# Patient Record
Sex: Female | Born: 1940 | Race: White | Hispanic: No | Smoking: Current some day smoker
Health system: Southern US, Community
[De-identification: ages and names within clinical notes are randomized; demographics above are authoritative.]

## PROBLEM LIST (undated history)

## (undated) DIAGNOSIS — I10 Essential (primary) hypertension: Secondary | ICD-10-CM

## (undated) DIAGNOSIS — M25561 Pain in right knee: Secondary | ICD-10-CM

## (undated) DIAGNOSIS — B019 Varicella without complication: Secondary | ICD-10-CM

## (undated) DIAGNOSIS — L219 Seborrheic dermatitis, unspecified: Secondary | ICD-10-CM

## (undated) DIAGNOSIS — H269 Unspecified cataract: Secondary | ICD-10-CM

## (undated) DIAGNOSIS — C44311 Basal cell carcinoma of skin of nose: Secondary | ICD-10-CM

## (undated) DIAGNOSIS — M791 Myalgia, unspecified site: Secondary | ICD-10-CM

## (undated) DIAGNOSIS — M25562 Pain in left knee: Secondary | ICD-10-CM

## (undated) DIAGNOSIS — K56609 Unspecified intestinal obstruction, unspecified as to partial versus complete obstruction: Secondary | ICD-10-CM

## (undated) HISTORY — DX: Seborrheic dermatitis, unspecified: L21.9

## (undated) HISTORY — PX: BASAL CELL CARCINOMA EXCISION: SHX1214

## (undated) HISTORY — DX: Varicella without complication: B01.9

## (undated) HISTORY — DX: Pain in right knee: M25.562

## (undated) HISTORY — DX: Myalgia, unspecified site: M79.10

## (undated) HISTORY — DX: Unspecified cataract: H26.9

## (undated) HISTORY — DX: Pain in right knee: M25.561

## (undated) HISTORY — DX: Basal cell carcinoma of skin of nose: C44.311

## (undated) HISTORY — PX: CATARACT EXTRACTION W/ INTRAOCULAR LENS IMPLANT: SHX1309

---

## 1999-12-02 ENCOUNTER — Emergency Department (HOSPITAL_COMMUNITY): Admission: EM | Admit: 1999-12-02 | Discharge: 1999-12-02 | Payer: Self-pay | Admitting: Emergency Medicine

## 2001-05-08 ENCOUNTER — Inpatient Hospital Stay (HOSPITAL_COMMUNITY): Admission: EM | Admit: 2001-05-08 | Discharge: 2001-05-12 | Payer: Self-pay | Admitting: Psychiatry

## 2001-05-15 ENCOUNTER — Other Ambulatory Visit (HOSPITAL_COMMUNITY): Admission: RE | Admit: 2001-05-15 | Discharge: 2001-05-23 | Payer: Self-pay | Admitting: Psychiatry

## 2008-10-28 ENCOUNTER — Other Ambulatory Visit: Admission: RE | Admit: 2008-10-28 | Discharge: 2008-10-28 | Payer: Self-pay | Admitting: Family Medicine

## 2008-10-29 ENCOUNTER — Ambulatory Visit (HOSPITAL_COMMUNITY): Admission: RE | Admit: 2008-10-29 | Discharge: 2008-10-29 | Payer: Self-pay | Admitting: Family Medicine

## 2011-01-26 ENCOUNTER — Ambulatory Visit (INDEPENDENT_AMBULATORY_CARE_PROVIDER_SITE_OTHER): Payer: Medicare Other | Admitting: Internal Medicine

## 2011-01-26 ENCOUNTER — Encounter: Payer: Self-pay | Admitting: Internal Medicine

## 2011-01-26 ENCOUNTER — Other Ambulatory Visit (INDEPENDENT_AMBULATORY_CARE_PROVIDER_SITE_OTHER): Payer: Medicare Other

## 2011-01-26 ENCOUNTER — Other Ambulatory Visit: Payer: Self-pay | Admitting: Internal Medicine

## 2011-01-26 ENCOUNTER — Other Ambulatory Visit

## 2011-01-26 VITALS — BP 140/90 | HR 57 | Temp 97.5°F | Ht 65.5 in | Wt 155.0 lb

## 2011-01-26 DIAGNOSIS — Z136 Encounter for screening for cardiovascular disorders: Secondary | ICD-10-CM

## 2011-01-26 DIAGNOSIS — G250 Essential tremor: Secondary | ICD-10-CM

## 2011-01-26 DIAGNOSIS — Z0000 Encounter for general adult medical examination without abnormal findings: Secondary | ICD-10-CM

## 2011-01-26 DIAGNOSIS — G252 Other specified forms of tremor: Secondary | ICD-10-CM

## 2011-01-26 LAB — CBC WITH DIFFERENTIAL/PLATELET
Basophils Relative: 0.6 % (ref 0.0–3.0)
Eosinophils Absolute: 0 10*3/uL (ref 0.0–0.7)
Eosinophils Relative: 0.6 % (ref 0.0–5.0)
Hemoglobin: 14.9 g/dL (ref 12.0–15.0)
Lymphocytes Relative: 38.3 % (ref 12.0–46.0)
MCHC: 33.5 g/dL (ref 30.0–36.0)
MCV: 87.3 fl (ref 78.0–100.0)
Neutro Abs: 4.2 10*3/uL (ref 1.4–7.7)
RBC: 5.09 Mil/uL (ref 3.87–5.11)
WBC: 7.9 10*3/uL (ref 4.5–10.5)

## 2011-01-26 LAB — COMPREHENSIVE METABOLIC PANEL
ALT: 15 U/L (ref 0–35)
AST: 17 U/L (ref 0–37)
Albumin: 4.3 g/dL (ref 3.5–5.2)
Alkaline Phosphatase: 72 U/L (ref 39–117)
Glucose, Bld: 86 mg/dL (ref 70–99)
Potassium: 5 mEq/L (ref 3.5–5.1)
Sodium: 141 mEq/L (ref 135–145)
Total Bilirubin: 0.2 mg/dL — ABNORMAL LOW (ref 0.3–1.2)
Total Protein: 7.2 g/dL (ref 6.0–8.3)

## 2011-01-26 LAB — LIPID PANEL
HDL: 96.9 mg/dL (ref >39.00)
Total CHOL/HDL Ratio: 2
Triglycerides: 113 mg/dL (ref 0.0–149.0)

## 2011-01-26 LAB — HEPATIC FUNCTION PANEL
ALT: 15 U/L (ref 0–35)
AST: 17 U/L (ref 0–37)
Albumin: 4.3 g/dL (ref 3.5–5.2)
Alkaline Phosphatase: 72 U/L (ref 39–117)
Total Protein: 7.2 g/dL (ref 6.0–8.3)

## 2011-01-26 LAB — VITAMIN B12: Vitamin B-12: 455 pg/mL (ref 211–911)
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
## 2011-02-23 ENCOUNTER — Encounter: Payer: Self-pay | Admitting: Internal Medicine

## 2011-02-23 ENCOUNTER — Ambulatory Visit (INDEPENDENT_AMBULATORY_CARE_PROVIDER_SITE_OTHER): Payer: Medicare Other | Admitting: Internal Medicine

## 2011-02-23 DIAGNOSIS — Z720 Tobacco use: Secondary | ICD-10-CM

## 2011-02-23 DIAGNOSIS — F17200 Nicotine dependence, unspecified, uncomplicated: Secondary | ICD-10-CM

## 2011-02-23 DIAGNOSIS — Z1239 Encounter for other screening for malignant neoplasm of breast: Secondary | ICD-10-CM

## 2011-02-23 NOTE — Patient Instructions (Signed)
Thanks for coming back. Immunizations are up to date. We are scheduling a mammogram for you as we speak. Please let us schedule you for colorectal cancer screening. Bone health - 1200mg  daily of calcium - diet + tums; Vit D [phone] iu daily Labs were great.  End of life Care- return copy of the MOST form at your discretion.

## 2011-02-23 NOTE — Progress Notes (Signed)
  Subjective:    Patient ID: Brenda Gibson, female    DOB: 09/22/1941, 70 y.o.   MRN: [id number]  HPIMrs. Blades presents for follow-up after her initial new patinet visit. She has had a chance to review her last visit and has not corrections or additions to the information. We reviewed her lab work, especially lipid management. Reviewed health maintenance needs that haven't been met. She continues to feel well.   PMH, FamHx and SocHx reviewed for any changes and relevance.     Review of Systems  Constitutional: Negative.   All other systems reviewed and are negative.       Objective:   Physical Exam  Constitutional: She is oriented to person, place, and time. She appears well-developed and well-nourished. No distress.  Eyes: Conjunctivae are normal.  Cardiovascular: Normal rate and regular rhythm.   Pulmonary/Chest: Effort normal.  Musculoskeletal: Normal range of motion.  Neurological: She is alert and oriented to person, place, and time. She has normal reflexes.  Skin: Skin is warm and dry.  Psychiatric: She has a normal mood and affect. Her behavior is normal. Thought content normal.          Assessment & Plan:  1. Lab review - excellent and protective lipid profile with LDL 117, HDL 92.6. Glucose normal, renal function and 'lytes normal.  2. Health Maintenance - discussed smoking cessation - again emphasizing the importance of cessation - she remains pre-contemplative. She does agree to having a mammogram scheduled. Discussed the importance of colorectal cancer screening - she is not read to schedule although she admits to the logic and importance of screening. She is fully immunized.  At this point she will return as needed. I have asked that she let me know when she is ready for colonoscopy and when she is ready to quit smoking.

## 2011-02-24 DIAGNOSIS — Z720 Tobacco use: Secondary | ICD-10-CM

## 2011-02-24 HISTORY — DX: Tobacco use: Z72.0
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
## 2011-03-19 NOTE — H&P (Signed)
[facility]  Patient:    Brenda Gibson, Brenda Gibson                      MRN: [id number] Adm. Date:  [id number] Attending:  Rachael Fee Dictator:   Young Berry Lorin Picket, R.N., F.N.P.                   Psychiatric Admission Assessment  DATE OF ADMISSION:  May 08, 2001  PATIENT IDENTIFICATION:  This is a 70 year old Caucasian female who is married.  She is an involuntary commitment by her husband for increasingly psychotic delusions and hallucinations.  HISTORY OF PRESENT ILLNESS:  The patient today is guarded but cooperative. She denies any auditory or visual hallucinations when asked directly about this; however, she does admit that she has been noncompliant with her medications and has been using only herbal supplements, which she does not specify, and eating "a natural diet."  The patient states that she believes that her husband is badgering her about her previous history of mental illness.  The patient also states that she believes that the car is defective in that fumes are building up in the gas tank.  Also reports that she has been trying to repair problems on the car.  The patient denies any specific suicidal ideation or homicidal ideation today.  In the commitment papers, the husband has reported that the patient was stating that she was seeing the neighbors floating around in the backyard and that the patient has been stopping the car in the middle of the road, uncapping the gas tank and attempting to get something out of the tank, that she has also been dismantling her car with a screwdriver.  PAST PSYCHIATRIC HISTORY:  The patient has been followed by [facility] but was last seen there approximately one year ago up until the day of admission.  The patient has a history of three prior inpatient admissions at [facility] and possibly others which are unclear at this time.  The patients husband reports that the  patient has regular intermittent episodes of psychosis.  SUBSTANCE ABUSE HISTORY:  The patient admits to occasional alcohol use; however, her husband did report that the patient has been drinking two to three 16 ounce beers daily.  The patient, herself, denies any illegal drug use or other substance abuse.  PAST MEDICAL HISTORY:  The patients primary care [place] [facility] is Dr. Dola Factor in [place].  The patients husband states that she does not go there with any regularity because "she doesnt like doctors."  She has no medical problems of which he is aware although the patient will state at times that she has had a heart attack.  Her husband reports that, in fact, what she is attempting to say is that her heart is broken because she is sad and worried about her son who is at [facility].  MEDICATIONS:  None.  The patient was previously managed on Stelazine 2.5 mg q.h.s. and doxepin the dose is unclear.  It has been reported by her husband that he thought she was taking approximately 50 mg of doxepin q.h.s.  DRUG ALLERGIES:  No known drug allergies.  PHYSICAL EXAMINATION:  GENERAL:  The patient was seen in the emergency room at [facility] on May 08, 2001.  At that time, her PE was negative.  There were no acute problems evident.  She has no somatic complaints at this time whatsoever.  LABORATORY  DATA:  Alcohol level was less than 5.  Glucose was 127.  Urine drug screen was negative.  SOCIAL HISTORY:  The patient is married and living with her husband, Jessa Stinson.  He has expressed concerns related to the chronicity of her illness and her future prognosis when he was speaking with nurses on the unit.  Family history: The patient has four sons, all reportedly have psychiatric problems according to the patients husband and the patient does state that she has son who lives at [facility] permanently.  She states this is because he is  hyperactive.  MENTAL STATUS EXAMINATION:  This is a disheveled, thin female with a blunted and guarded affect.  She is cooperative but appears nervous with eyes darting from side to side.  Speech is normal in pace and tone, no pressure noted.  It is mostly relevant but is irrelevant at times.  Mood is guarded and suspicious.  Thought process is positive for paranoid ideations believing that her husband is against her and she does appear to be responding to internal stimuli.  She denies suicidal ideation or homicidal ideation when she was asked directly.  Cognitively, she is oriented to person and place, she is not oriented to date.  She does appear to be intact cognitively.  ADMISSION DIAGNOSES: Axis I:    1. Rule out schizophrenia, paranoid type.            2. Ethyl alcohol abuse, rule out dependence. Axis II:   Deferred. Axis III:  None. Axis IV:   Moderate problems in dealing with her own mental illness. Axis V:    Current 28, past year estimated 55.  INITIAL PLAN OF CARE:  Plan is to admit the patient involuntarily to stabilize her thinking with q.33m. checks in place.  We will initiate Risperdal 0.25 mg b.i.d. and 0.5 mg at h.s., and we will give her p.r.n. Haldol 5 mg and Ativan 2 mg.  The patient initially was on one to one observation when she arrived on the unit because she was refusing the admission process and refused to answer any questions or to release any of her belongings or purse for inspection. She was also refusing any medication at that time.  However, then she was subsequently medicated and is beginning to respond a bit at this time.  We will plan on further contacts with her husband to do some long range planning for her care and to determine a level of support at home.  ESTIMATED LENGTH OF STAY:  Five to six days. DD:  05/09/01 TD:  05/09/01 Job: [id number]

## 2011-11-16 ENCOUNTER — Telehealth: Payer: Self-pay

## 2011-11-16 NOTE — Telephone Encounter (Signed)
Ok for referral to [facility] orthopedics
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
## 2011-11-16 NOTE — Telephone Encounter (Signed)
Pt will expect a call from [facility] with appt info.

## 2011-11-18 ENCOUNTER — Other Ambulatory Visit: Payer: Self-pay | Admitting: *Deleted

## 2011-11-18 DIAGNOSIS — M549 Dorsalgia, unspecified: Secondary | ICD-10-CM

## 2013-02-22 ENCOUNTER — Encounter: Payer: Self-pay | Admitting: Internal Medicine

## 2013-02-22 ENCOUNTER — Ambulatory Visit (INDEPENDENT_AMBULATORY_CARE_PROVIDER_SITE_OTHER)
Admission: RE | Admit: 2013-02-22 | Discharge: 2013-02-22 | Disposition: A | Discharge status: Home / Self Care | Payer: Medicare Other | Source: Ambulatory Visit | Attending: Internal Medicine | Admitting: Internal Medicine

## 2013-02-22 ENCOUNTER — Ambulatory Visit (INDEPENDENT_AMBULATORY_CARE_PROVIDER_SITE_OTHER): Payer: Medicare Other | Admitting: Internal Medicine

## 2013-02-22 VITALS — BP 118/80 | HR 61 | Temp 98.2°F | Resp 16 | Wt 146.5 lb

## 2013-02-22 DIAGNOSIS — M6740 Ganglion, unspecified site: Secondary | ICD-10-CM

## 2013-02-22 DIAGNOSIS — M25532 Pain in left wrist: Secondary | ICD-10-CM

## 2013-02-22 DIAGNOSIS — M25539 Pain in unspecified wrist: Secondary | ICD-10-CM

## 2013-02-22 DIAGNOSIS — M67432 Ganglion, left wrist: Secondary | ICD-10-CM

## 2013-02-22 DIAGNOSIS — M67439 Ganglion, unspecified wrist: Secondary | ICD-10-CM | POA: Insufficient documentation

## 2013-02-22 NOTE — Patient Instructions (Signed)
Ganglion  A ganglion is a swelling under the skin that is filled with a thick, jelly-like substance. It is a synovial cyst. This is caused by a break (rupture) of the joint lining from the joint space. A ganglion often occurs near an area of repeated minor trauma (damage caused by an accident). Trauma may also be a repetitive movement at work or in a sport.  TREATMENT   It often goes away without treatment. It may reappear later. Sometimes a ganglion may need to be surgically removed. Often they are drained and injected with a steroid. Sometimes they respond to:   Rest.   Splinting.  HOME CARE INSTRUCTIONS    Your caregiver will decide the best way of treating your ganglion. Do not try to break the ganglion yourself by pressing on it, poking it with a needle, or hitting it with a heavy object.   Use medications as directed.  SEEK MEDICAL CARE IF:    The ganglion becomes larger or more painful.   You have increased redness or swelling.   You have weakness or numbness in your hand or wrist.  MAKE SURE YOU:    Understand these instructions.   Will watch your condition.   Will get help right away if you are not doing well or get worse.  Document Released: 10/15/2000 Document Revised: 01/10/2012 Document Reviewed: 12/12/2007  ExitCare Patient Information 2013 [employer].

## 2013-02-24 ENCOUNTER — Encounter: Payer: Self-pay | Admitting: Internal Medicine

## 2013-02-24 NOTE — Assessment & Plan Note (Signed)
I referred her to consider having this removed

## 2013-02-24 NOTE — Progress Notes (Signed)
  Subjective:    Patient ID: Brenda Gibson, female    DOB: Nov 28, 1940, 72 y.o.   MRN: [id number]  HPI  She comes in today with a concern about a cyst on her left wrist that he has been enlarging for several months.  Review of Systems  Constitutional: Negative.   HENT: Negative.   Eyes: Negative.   Respiratory: Negative.   Cardiovascular: Negative.   Gastrointestinal: Negative.   Endocrine: Negative.   Genitourinary: Negative.   Musculoskeletal: Negative.   Skin: Negative.   Allergic/Immunologic: Negative.   Neurological: Negative.   Hematological: Negative.   Psychiatric/Behavioral: Negative.        Objective:   Physical Exam  Musculoskeletal:       Left wrist: She exhibits deformity (prominent raised cystic soft lesion over the lateral aspect). She exhibits normal range of motion, no tenderness, no bony tenderness, no swelling, no effusion, no crepitus and no laceration.       Arms:         Assessment & Plan:

## 2013-02-24 NOTE — Assessment & Plan Note (Signed)
Plain film is normal

## 2013-06-05 ENCOUNTER — Encounter: Payer: Self-pay | Admitting: Internal Medicine

## 2013-06-05 ENCOUNTER — Ambulatory Visit (INDEPENDENT_AMBULATORY_CARE_PROVIDER_SITE_OTHER): Payer: Medicare Other | Admitting: Internal Medicine

## 2013-06-05 VITALS — BP 150/86 | HR 58 | Temp 97.2°F | Wt 144.4 lb

## 2013-06-05 DIAGNOSIS — L409 Psoriasis, unspecified: Secondary | ICD-10-CM

## 2013-06-05 DIAGNOSIS — M25559 Pain in unspecified hip: Secondary | ICD-10-CM

## 2013-06-05 DIAGNOSIS — I10 Essential (primary) hypertension: Secondary | ICD-10-CM

## 2013-06-05 DIAGNOSIS — M25552 Pain in left hip: Secondary | ICD-10-CM | POA: Insufficient documentation

## 2013-06-05 DIAGNOSIS — L408 Other psoriasis: Secondary | ICD-10-CM

## 2013-06-05 HISTORY — DX: Essential (primary) hypertension: I10

## 2013-06-05 MED ORDER — FUROSEMIDE 20 MG PO TABS: 20.0000 mg | ORAL_TABLET | Freq: Every day | ORAL | Status: DC

## 2013-06-05 MED ORDER — CLOBETASOL PROPIONATE 0.05 % EX SHAM: 10.0000 mL | MEDICATED_SHAMPOO | Freq: Every day | CUTANEOUS | Status: DC | PRN

## 2013-06-05 MED ORDER — CELECOXIB 200 MG PO CAPS: 200.0000 mg | ORAL_CAPSULE | Freq: Two times a day (BID) | ORAL | Status: DC

## 2013-06-05 NOTE — Assessment & Plan Note (Signed)
BP Readings from Last 3 Encounters:  06/05/13 150/86  02/22/13 118/80  02/23/11 138/82   Plan Add low dose loop diuretic  Recheck in 1 month, will need Bmet at that time.

## 2013-06-05 NOTE — Assessment & Plan Note (Signed)
On-going left hip pain but she is able to ambulate and manage all her ADLs.  Plan Low dose NSAID  Regular exercise.  Ortho follow up as needed.

## 2013-06-05 NOTE — Assessment & Plan Note (Signed)
Mild psoriatic scalp.  Plan Renewed Clobex 0.05% shampoo
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
## 2013-06-05 NOTE — Patient Instructions (Addendum)
Good to see you.   1. Blood pressure - persistently elevated systolic blood pressure (top number) Plan Furosemide 20 mg once a day.  If there is too much of a problem with urination we can change  2. Hip pain - Ok to use celebrex as needed.  3. Seborrheic dermatitis scalp - new Rx for Clobex shampoo to use as needed.   4. Colon cancer screening - please give consideration to colonoscopy. It prevents cancer  5. You need to not smoke. It causese COPD, empyhysema, lung cancer, heart attacks, strokes, peripheral vascular disease and loss of limb, bad breath. Plan When you are ready to quit let me know.   Smoking Cessation, Tips for Success YOU CAN QUIT SMOKING If you are ready to quit smoking, congratulations! You have chosen to help yourself be healthier. Cigarettes bring nicotine, tar, carbon monoxide, and other irritants into your body. Your lungs, heart, and blood vessels will be able to work better without these poisons. There are many different ways to quit smoking. Nicotine gum, nicotine patches, a nicotine inhaler, or nicotine nasal spray can help with physical craving. Hypnosis, support groups, and medicines help break the habit of smoking. Here are some tips to help you quit for good.  Throw away all cigarettes.  Clean and remove all ashtrays from your home, work, and car.  On a card, write down your reasons for quitting. Carry the card with you and read it when you get the urge to smoke.  Cleanse your body of nicotine. Drink enough water and fluids to keep your urine clear or pale yellow. Do this after quitting to flush the nicotine from your body.  Learn to predict your moods. Do not let a bad situation be your excuse to have a cigarette. Some situations in your life might tempt you into wanting a cigarette.  Never have "just one" cigarette. It leads to wanting another and another. Remind yourself of your decision to quit.  Change habits associated with smoking. If you smoked  while driving or when feeling stressed, try other activities to replace smoking. Stand up when drinking your coffee. Brush your teeth after eating. Sit in a different chair when you read the paper. Avoid alcohol while trying to quit, and try to drink fewer caffeinated beverages. Alcohol and caffeine may urge you to smoke.  Avoid foods and drinks that can trigger a desire to smoke, such as sugary or spicy foods and alcohol.  Ask people who smoke not to smoke around you.  Have something planned to do right after eating or having a cup of coffee. Take a walk or exercise to perk you up. This will help to keep you from overeating.  Try a relaxation exercise to calm you down and decrease your stress. Remember, you may be tense and nervous for the first 2 weeks after you quit, but this will pass.  Find new activities to keep your hands busy. Play with a pen, coin, or rubber band. Doodle or draw things on paper.  Brush your teeth right after eating. This will help cut down on the craving for the taste of tobacco after meals. You can try mouthwash, too.  Use oral substitutes, such as lemon drops, carrots, a cinnamon stick, or chewing gum, in place of cigarettes. Keep them handy so they are available when you have the urge to smoke.  When you have the urge to smoke, try deep breathing.  Designate your home as a nonsmoking area.  If you are  a heavy smoker, ask your caregiver about a prescription for nicotine chewing gum. It can ease your withdrawal from nicotine.  Reward yourself. Set aside the cigarette money you save and buy yourself something nice.  Look for support from others. Join a support group or smoking cessation program. Ask someone at home or at work to help you with your plan to quit smoking.  Always ask yourself, "Do I need this cigarette or is this just a reflex?" Tell yourself, "Today, I choose not to smoke," or "I do not want to smoke." You are reminding yourself of your decision to  quit, even if you do smoke a cigarette. HOW WILL I FEEL WHEN I QUIT SMOKING?  The benefits of not smoking start within days of quitting.  You may have symptoms of withdrawal because your body is used to nicotine (the addictive substance in cigarettes). You may crave cigarettes, be irritable, feel very hungry, cough often, get headaches, or have difficulty concentrating.  The withdrawal symptoms are only temporary. They are strongest when you first quit but will go away within 10 to 14 days.  When withdrawal symptoms occur, stay in control. Think about your reasons for quitting. Remind yourself that these are signs that your body is healing and getting used to being without cigarettes.  Remember that withdrawal symptoms are easier to treat than the major diseases that smoking can cause.  Even after the withdrawal is over, expect periodic urges to smoke. However, these cravings are generally short-lived and will go away whether you smoke or not. Do not smoke!  If you relapse and smoke again, do not lose hope. Most smokers quit 3 times before they are successful.  If you relapse, do not give up! Plan ahead and think about what you will do the next time you get the urge to smoke. LIFE AS A NONSMOKER: MAKE IT FOR A MONTH, MAKE IT FOR LIFE Day 1: Hang this page where you will see it every day. Day 2: Get rid of all ashtrays, matches, and lighters. Day 3: Drink water. Breathe deeply between sips. Day 4: Avoid places with smoke-filled air, such as bars, clubs, or the smoking section of restaurants. Day 5: Keep track of how much money you save by not smoking. Day 6: Avoid boredom. Keep a good book with you or go to the movies. Day 7: Reward yourself! One week without smoking! Day 8: Make a dental appointment to get your teeth cleaned. Day 9: Decide how you will turn down a cigarette before it is offered to you. Day 10: Review your reasons for quitting. Day 11: Distract yourself. Stay active to  keep your mind off smoking and to relieve tension. Take a walk, exercise, read a book, do a crossword puzzle, or try a new hobby. Day 12: Exercise. Get off the bus before your stop or use stairs instead of escalators. Day 13: Call on friends for support and encouragement. Day 14: Reward yourself! Two weeks without smoking! Day 15: Practice deep breathing exercises. Day 16: Bet a friend that you can stay a nonsmoker. Day 17: Ask to sit in nonsmoking sections of restaurants. Day 18: Hang up "No Smoking" signs. Day 19: Think of yourself as a nonsmoker. Day 20: Each morning, tell yourself you will not smoke. Day 21: Reward yourself! Three weeks without smoking! Day 22: Think of smoking in negative ways. Remember how it stains your teeth, gives you bad breath, and leaves you short of breath. Day 23: Eat a nutritious  breakfast. Day 24:Do not relive your days as a smoker. Day 25: Hold a pencil in your hand when talking on the telephone. Day 26: Tell all your friends you do not smoke. Day 27: Think about how much better food tastes. Day 28: Remember, one cigarette is one too many. Day 29: Take up a hobby that will keep your hands busy. Day 30: Congratulations! One month without smoking! Give yourself a big reward. Your caregiver can direct you to community resources or hospitals for support, which may include:  Group support.  Education.  Hypnosis.  Subliminal therapy. Document Released: 07/16/2004 Document Revised: 01/10/2012 Document Reviewed: 08/04/2009 [facility] Patient Information 2014 [place], [place].

## 2013-07-30 ENCOUNTER — Telehealth: Payer: Self-pay | Admitting: *Deleted

## 2013-07-30 NOTE — Telephone Encounter (Signed)
Spoke with pt.  Transferred to scheduling for appoint

## 2013-07-30 NOTE — Telephone Encounter (Signed)
On low dose furosemide for BP. If this is not tolerable will need OV to discuss change in medications

## 2013-07-30 NOTE — Telephone Encounter (Signed)
Pt called requesting the Furosemide be changed, states it is too much for her bladder. Please advise

## 2013-08-02 ENCOUNTER — Encounter: Payer: Self-pay | Admitting: Internal Medicine

## 2013-08-02 ENCOUNTER — Ambulatory Visit (INDEPENDENT_AMBULATORY_CARE_PROVIDER_SITE_OTHER): Payer: Medicare Other | Admitting: Internal Medicine

## 2013-08-02 VITALS — BP 130/80 | HR 59 | Temp 97.9°F | Wt 143.6 lb

## 2013-08-02 DIAGNOSIS — N318 Other neuromuscular dysfunction of bladder: Secondary | ICD-10-CM

## 2013-08-02 DIAGNOSIS — I10 Essential (primary) hypertension: Secondary | ICD-10-CM

## 2013-08-02 DIAGNOSIS — N3281 Overactive bladder: Secondary | ICD-10-CM | POA: Insufficient documentation

## 2013-08-02 NOTE — Assessment & Plan Note (Signed)
Trial of solifenacin succinate (vesicare) with continued furosemide to see if urinary symptoms improve. Will send mychart message regarding progress. If symptoms improve with this anticholinergic, will consider oxybutynin for long-term management.

## 2013-08-02 NOTE — Assessment & Plan Note (Signed)
Continue treatment with furosemide 20 mg daily with anticholinergic to treat baseline and furosemide-exacerbated symptoms of overactive bladder.
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
## 2013-08-02 NOTE — Patient Instructions (Addendum)
Thanks for coming back.  The lasix is doing a great job controlling the blood pressure but clearly is making the pre-existing problem of urinary frequency and urgency (OAB) worse.  Plan Continue lasix  Add Vesicare, a drug for OAB. IF this works we will call in an Rx for a generic product - Oxybutynin.  Report back to me using MyChart  Overactive Bladder, Adult The bladder has two functions that are totally opposite of the other. One is to relax and stretch out so it can store urine (fills like a balloon), and the other is to contract and squeeze down so that it can empty the urine that it has stored. Proper functioning of the bladder is a complex mixing of these two functions. The filling and emptying of the bladder can be influenced by:  The bladder.  The spinal cord.  The brain.  The nerves going to the bladder.  Other organs that are closely related to the bladder such as prostate in males and the vagina in females. As your bladder fills with urine, nerve signals are sent from the bladder to the brain to tell you that you may need to urinate. Normal urination requires that the bladder squeeze down with sufficient strength to empty the bladder, but this also requires that the bladder squeeze down sufficiently long to finish the job. In addition the sphincter muscles, which normally keep you from leaking urine, must also relax so that the urine can pass. Coordination between the bladder muscle squeezing down and the sphincter muscles relaxing is required to make everything happen normally. With an overactive bladder sometimes the muscles of the bladder contract unexpectedly and involuntarily and this causes an urgent need to urinate. The normal response is to try to hold urine in by contracting the sphincter muscles. Sometimes the bladder contracts so strongly that the sphincter muscles cannot stop the urine from passing out and incontinence occurs. This kind of incontinence is called urge  incontinence. Having an overactive bladder can be embarrassing and awkward. It can keep you from living life the way you want to. Many people think it is just something you have to put up with as you grow older or have certain health conditions. In fact, there are treatments that can help make your life easier and more pleasant. CAUSES  Many things can cause an overactive bladder. Possibilities include:  Urinary tract infection or infection of nearby tissues such as the prostate.  Prostate enlargement.  In women, multiple pregnancies or surgery on the uterus or urethra.  Bladder stones, inflammation or tumors.  Caffeine.  Alcohol.  Medications. For example, diuretics (drugs that help the body get rid of extra fluid) increase urine production. Some other medicines must be taken with lots of fluids.  Muscle or nerve weakness. This might be the result of a spinal cord injury, a stroke, multiple sclerosis or Parkinson's disease.  Diabetes can cause a high urine volume which fills the bladder so quickly that the normal urge to urinate is triggered very strongly. SYMPTOMS   Loss of bladder control. You feel the need to urinate and cannot make your body wait.  Sudden, strong urges to urinate.  Urinating 8 or more times a day.  Waking up to urinate two or more times a night. DIAGNOSIS  To decide if you have overactive bladder, your healthcare provider will probably:  Ask about symptoms you have noticed.  Ask about your overall health. This will include questions about any medications you are taking.  Do a physical examination. This will help determine if there are obvious blockages or other problems.  Order some tests. These might include:  A blood test to check for diabetes or other health issues that could be contributing to the problem.  Urine testing. This could measure the flow of urine and the pressure on the bladder.  A test of your neurological system (the brain, spinal  cord and nerves). This is the system that senses the need to urinate. Some of these tests are called flow tests, bladder pressure tests and electrical measurements of the sphincter muscle.  A bladder test to check whether it is emptying completely when you urinate.  Cytoscopy. This test uses a thin tube with a tiny camera on it. It offers a look inside your urethra and bladder to see if there are problems.  Imaging tests. You might be given a contrast dye and then asked to urinate. X-rays are taken to see how your bladder is working. TREATMENT  An overactive bladder can be treated in many ways. The treatment will depend on the cause. Whether you have a mild or severe case also makes a difference. Often, treatment can be given in your healthcare provider's office or clinic. Be sure to discuss the different options with your caregiver. They include:  Behavioral treatments. These do not involve medication or surgery:  Bladder training. For this, you would follow a schedule to urinate at regular intervals. This helps you learn to control the urge to urinate. At first, you might be asked to wait a few minutes after feeling the urge. In time, you should be able to schedule bathroom visits an hour or more apart.  Kegel exercises. These exercises strengthen the pelvic floor muscles, which support the bladder. By toning these muscles, they can help control urination, even if the bladder muscles are overactive. A specialist will teach you how to do these exercises correctly. They will require daily practice.  Weight loss. If you are obese or overweight, losing weight might stop your bladder from being overactive. Talk to your healthcare provider about how many pounds you should lose. Also ask if there is a specific program or method that would work best for you.  Diet change. This might be suggested if constipation is making your overactive bladder worse. Your healthcare provider or a nutritionist can  explain ways to change what you eat to ease constipation. Other people might need to take in less caffeine or alcohol. Sometimes drinking fewer fluids is needed, too.  Protection. This is not an actual treatment. But, you could wear special pads to take care of any leakage while you wait for other treatments to take effect. This will help you avoid embarrassment.  Physical treatments.  Electrical stimulation. Electrodes will send gentle pulses to the nerves or muscles that help control the bladder. The goal is to strengthen them. Sometimes this is done with the electrodes outside of the body. Or, they might be placed inside the body (implanted). This treatment can take several months to have an effect.  Medications. These are usually used along with other treatments. Several medicines are available. Some are injected into the muscles involved in urination. Others come in pill form. Medications sometimes prescribed include:  Anticholinergics. These drugs block the signals that the nerves deliver to the bladder. This keeps it from releasing urine at the wrong time. Researchers think the drugs might help in other ways, too.  Imipramine. This is an antidepressant. But, it relaxes bladder muscles.  Botox. This is still experimental. Some people believe that injecting it into the bladder muscles will relax them so they work more normally. It has also been injected into the sphincter muscle when the sphincter muscle does not open properly. This is a temporary fix, however. Also, it might make matters worse, especially in older people.  Surgery.  A device might be implanted to help manage your nerves. It works on the nerves that signal when you need to urinate.  Surgery is sometimes needed with electrical stimulation. If the electrodes are implanted, this is done through surgery.  Sometimes repairs need to be made through surgery. For example, the size of the bladder can be changed. This is usually  done in severe cases only. HOME CARE INSTRUCTIONS   Take any medications your healthcare provider prescribed or suggested. Follow the directions carefully.  Practice any lifestyle changes that are recommended. These might include:  Drinking less fluid or drinking at different times of the day. If you need to urinate often during the night, for example, you may need to stop drinking fluids early in the evening.  Cutting down on caffeine or alcohol. They can both make an overactive bladder worse. Caffeine is found in coffee, tea and sodas.  Doing Kegel exercises to strengthen muscles.  Losing weight, if that is recommended.  Eating a healthy and balanced diet. This will help you avoid constipation.  Keep a journal or a log. You might be asked to record how much you drink and when, and also when you feel the need to urinate.  Learn how to care for implants or other devices, such as pessaries. SEEK MEDICAL CARE IF:   Your overactive bladder gets worse.  You feel increased pain or irritation when you urinate.  You notice blood in your urine.  You have questions about any medications or devices that your healthcare provider recommended.  You notice blood, pus or swelling at the site of any test or treatment procedure.  You have an oral temperature above 102 F (38.9 C). SEEK IMMEDIATE MEDICAL CARE IF:  You have an oral temperature above 102 F (38.9 C), not controlled by medicine. Document Released: 08/14/2009 Document Revised: 01/10/2012 Document Reviewed: 08/14/2009 [facility] [place] Patient Information 2014 [place], [place].

## 2013-08-06 ENCOUNTER — Encounter: Payer: Self-pay | Admitting: Internal Medicine

## 2013-08-06 MED ORDER — OXYBUTYNIN CHLORIDE 5 MG PO TABS: 5.0000 mg | ORAL_TABLET | Freq: Two times a day (BID) | ORAL | Status: DC

## 2013-12-12 DIAGNOSIS — F259 Schizoaffective disorder, unspecified: Secondary | ICD-10-CM | POA: Diagnosis not present

## 2014-01-02 DIAGNOSIS — F4323 Adjustment disorder with mixed anxiety and depressed mood: Secondary | ICD-10-CM | POA: Diagnosis not present

## 2014-02-07 DIAGNOSIS — F259 Schizoaffective disorder, unspecified: Secondary | ICD-10-CM | POA: Diagnosis not present

## 2014-03-05 DIAGNOSIS — F259 Schizoaffective disorder, unspecified: Secondary | ICD-10-CM | POA: Diagnosis not present

## 2014-04-03 DIAGNOSIS — F259 Schizoaffective disorder, unspecified: Secondary | ICD-10-CM | POA: Diagnosis not present

## 2014-04-30 DIAGNOSIS — F259 Schizoaffective disorder, unspecified: Secondary | ICD-10-CM | POA: Diagnosis not present

## 2014-05-22 DIAGNOSIS — F259 Schizoaffective disorder, unspecified: Secondary | ICD-10-CM | POA: Diagnosis not present

## 2014-06-04 ENCOUNTER — Ambulatory Visit (INDEPENDENT_AMBULATORY_CARE_PROVIDER_SITE_OTHER): Payer: Medicare Other | Admitting: Emergency Medicine

## 2014-06-04 VITALS — BP 138/62 | HR 74 | Temp 98.0°F | Resp 16 | Ht 65.0 in | Wt 129.0 lb

## 2014-06-04 DIAGNOSIS — K0510 Chronic gingivitis, plaque induced: Secondary | ICD-10-CM

## 2014-06-04 MED ORDER — CEPHALEXIN 500 MG PO CAPS: 500.0000 mg | ORAL_CAPSULE | Freq: Four times a day (QID) | ORAL | Status: DC

## 2014-06-04 NOTE — Patient Instructions (Signed)
Gingivitis °Gingivitis is a form of gum (periodontal) disease that causes redness, soreness, and swelling (inflammation) of your gums. °CAUSES °The most common cause of gingivitis is poor oral hygiene. A sticky substance made of bacteria, mucus, and food particles (plaque), is deposited on the exposed part of teeth. As plaque builds up, it reacts with the saliva in your mouth to form something called  tartar. Tartar is a hard deposit that becomes trapped around the base of the tooth. Plaque and tartar irritate the gums, leading to the formation of gingivitis. Other factors that increase your risk for gingivitis include:  °· Tobacco use. °· Diabetes. °· Older age. °· Certain medications. °· Certain viral or fungal infections. °· Dry mouth. °· Hormonal changes such as during pregnancy. °· Poor nutrition. °· Substance abuse. °· Poor fitting dental restorations or appliances. °SYMPTOMS °You may notice inflammation of the soft tissue (gingiva) around the teeth. When these tissues become inflamed, they bleed easily, especially during flossing or brushing. The gums may also be:  °· Tender to the touch. °· Bright red, purple red, or have a shiny appearance. °· Swollen. °· Wearing away from the teeth (receding), which exposes more of the tooth. °Bad breath is often present. Continued infection around teeth can eventually cause cavities and loosen teeth. This may lead to eventual tooth loss. °DIAGNOSIS °A medical and dental history will be taken. Your mouth, teeth, and gums will be examined. Your dentist will look for soft, swollen purple-red, irritated gums. There may be deposits of plaque and tartar at the base of the teeth. Your gums will be looked at for the degree of redness, puffiness, and bleeding tendencies. Your dentist will see if any of the teeth are loose. X-rays may be taken to see if the inflammation has spread to the supporting structures of the teeth. °TREATMENT °The goal is to reduce and reverse the  inflammation. Proper treatment can usually reverse the symptoms of gingivitis and prevent further progression of the disease. Have your teeth cleaned. During the cleaning, all plaque and tartar will be removed. Instruction for proper home care will be given. You will need regular professional cleanings and check-ups in the future. °HOME CARE INSTRUCTIONS °· Brush your teeth twice a day and floss at least once per day. When flossing, it is best to floss first then brush. °· Limit sugar between meals and maintain a well-balanced diet.  °· Even the best dental hygiene will not prevent plaque from developing. It is necessary for you to see your dentist on a regular basis for cleaning and regular checkups. °· Your dentist can recommend proper oral hygiene and mouth care and suggest special toothpastes or mouth rinses. °· Stop smoking. °SEEK DENTAL OR MEDICAL CARE IF: °· You have painful, reddened tissue around your teeth, or you have puffy swollen gums. °· You have difficulty chewing. °· You notice any loose or infected teeth. °· You have swollen glands. °· Your gums bleed easily when you brush your teeth or are very tender to the touch. °Document Released: 04/13/2001 Document Revised: 01/10/2012 Document Reviewed: 01/22/2011 °[employer]® Patient Information ©2015 [employer]. This information is not intended to replace advice given to you by your health care provider. Make sure you discuss any questions you have with your health care provider. ° °

## 2014-06-04 NOTE — Progress Notes (Signed)
[facility] [address], [place] [address] [phone]  Date:  06/04/2014   Name:  Brenda Gibson   DOB:  07/25/1941   MRN:  [id number]  PCP:  Adella Hare, MD    Chief Complaint: Oral Pain   History of Present Illness:  Brenda Gibson is a 73 y.o. very pleasant female patient who presents with the following:  Patient has a painful swollen upper left gingiva.  Seen today by her dentist's coverage provider who informed her she has an abscess and referred her to an periodontist to see tomorrow.  She has severe pain and requests treatment Apparently the dentist failed to giver her antibiotic or medication for pain. She has ano fever or chills. No nausea or vomiting.  No cough or coryza.  No improvement with over the counter medications or other home remedies. Denies other complaint or health concern today.   Patient Active Problem List   Diagnosis Date Noted  . Overactive bladder 08/02/2013  . Essential hypertension, benign 06/05/2013  . 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 Pain in left hip 06/05/2013  . Wrist pain 02/22/2013  . Ganglion cyst of wrist 02/22/2013  . Tobacco abuse 02/24/2011    Past Medical History  Diagnosis Date  . Measles   . Varicella   . Myalgia   . Knee pain, bilateral   . Seborrheic dermatitis of scalp   . Basal cell carcinoma of ala nasi   . Cataract     Past Surgical History  Procedure Laterality Date  . Cataract extraction w/ intraocular lens implant      right eye. '09 (Dr. Dolores Lory)  . Basal cell carcinoma excision      '11 ([place])    History  Substance Use Topics  . Smoking status: Current Every Day Smoker -- 0.60 packs/day for 52 years    Types: Cigarettes  . Smokeless tobacco: Never Used     Comment: Pt smokes 5 cigarettes per day  . Alcohol Use: Yes     Comment: rare use    Family History  Problem Relation Age of Onset  . Alcohol abuse Mother   . Cancer Father 25    stomach cancer  . Prostate cancer  Father   . Diabetes Neg Hx   . Heart disease Neg Hx   . Hypertension Maternal Grandfather     No Known Allergies  Medication list has been reviewed and updated.  Current Outpatient Prescriptions on File Prior to Visit  Medication Sig Dispense Refill  . celecoxib (CELEBREX) 200 MG capsule Take 1 capsule (200 mg total) by mouth 2 (two) times daily.  30 capsule  5  . Clobetasol Propionate (CLOBEX) 0.05 % shampoo Apply 10 mLs topically daily as needed.  118 mL  0  . furosemide (LASIX) 20 MG tablet Take 1 tablet (20 mg total) by mouth daily.  30 tablet  3  . oxybutynin (DITROPAN) 5 MG tablet Take 1 tablet (5 mg total) by mouth 2 (two) times daily.  60 tablet  11   No current facility-administered medications on file prior to visit.    Review of Systems:  As per HPI, otherwise negative.    Physical Examination: Filed Vitals:   06/04/14 1925  BP: 138/62  Pulse: 74  Temp: 98 F (36.7 C)  Resp: 16   Filed Vitals:   06/04/14 1925  Height: 5\' 5"  (1.651 m)  Weight: 129 lb (58.514 kg)   Body mass index is 21.47  kg/(m^2). Ideal Body Weight: Weight in (lb) to have BMI = 25: 149.9   GEN: WDWN, NAD, Non-toxic, Alert & Oriented x 3 HEENT: Atraumatic, Normocephalic.  Left maxillary gingiva swollen, tender and red.  Multiple extractions Ears and Nose: No external deformity. EXTR: No clubbing/cyanosis/edema NEURO: Normal gait.  PSYCH: Normally interactive. Conversant. Not depressed or anxious appearing.  Calm demeanor.    Assessment and Plan: Gingivitis Keflex   Signed,  Ellison Carwin, MD

## 2014-07-12 DIAGNOSIS — F259 Schizoaffective disorder, unspecified: Secondary | ICD-10-CM | POA: Diagnosis not present

## 2014-07-15 DIAGNOSIS — Z23 Encounter for immunization: Secondary | ICD-10-CM | POA: Diagnosis not present

## 2014-08-12 ENCOUNTER — Ambulatory Visit (INDEPENDENT_AMBULATORY_CARE_PROVIDER_SITE_OTHER): Payer: Medicare Other | Admitting: Family

## 2014-08-12 ENCOUNTER — Encounter: Payer: Self-pay | Admitting: Family

## 2014-08-12 ENCOUNTER — Other Ambulatory Visit (INDEPENDENT_AMBULATORY_CARE_PROVIDER_SITE_OTHER): Payer: Medicare Other

## 2014-08-12 VITALS — BP 130/80 | HR 62 | Temp 98.5°F | Ht 65.0 in | Wt 127.1 lb

## 2014-08-12 DIAGNOSIS — R5383 Other fatigue: Secondary | ICD-10-CM | POA: Diagnosis not present

## 2014-08-12 DIAGNOSIS — I10 Essential (primary) hypertension: Secondary | ICD-10-CM

## 2014-08-12 DIAGNOSIS — Z0000 Encounter for general adult medical examination without abnormal findings: Secondary | ICD-10-CM

## 2014-08-12 DIAGNOSIS — M810 Age-related osteoporosis without current pathological fracture: Secondary | ICD-10-CM

## 2014-08-12 DIAGNOSIS — Z23 Encounter for immunization: Secondary | ICD-10-CM | POA: Diagnosis not present

## 2014-08-12 DIAGNOSIS — Z9189 Other specified personal risk factors, not elsewhere classified: Secondary | ICD-10-CM | POA: Insufficient documentation

## 2014-08-12 DIAGNOSIS — Z79899 Other long term (current) drug therapy: Secondary | ICD-10-CM

## 2014-08-12 HISTORY — DX: Age-related osteoporosis without current pathological fracture: M81.0

## 2014-08-12 LAB — CBC
HEMATOCRIT: 45.2 % (ref 36.0–46.0)
Hemoglobin: 14.7 g/dL (ref 12.0–15.0)
MCHC: 32.5 g/dL (ref 30.0–36.0)
MCV: 86.8 fl (ref 78.0–100.0)
PLATELETS: 311 10*3/uL (ref 150.0–400.0)
RBC: 5.21 Mil/uL — AB (ref 3.87–5.11)
RDW: 15.3 % (ref 11.5–15.5)
WBC: 8.1 10*3/uL (ref 4.0–10.5)

## 2014-08-12 LAB — BASIC METABOLIC PANEL
BUN: 10 mg/dL (ref 6–23)
CALCIUM: 9.4 mg/dL (ref 8.4–10.5)
CO2: 28 meq/L (ref 19–32)
CREATININE: 0.6 mg/dL (ref 0.4–1.2)
Chloride: 101 mEq/L (ref 96–112)
GFR: 97.13 mL/min (ref >60.00)
Glucose, Bld: 82 mg/dL (ref 70–99)
Potassium: 4.6 mEq/L (ref 3.5–5.1)
SODIUM: 135 meq/L (ref 135–145)

## 2014-08-12 LAB — LIPID PANEL
CHOL/HDL RATIO: 2
CHOLESTEROL: 225 mg/dL — AB (ref 0–200)
HDL: 92.4 mg/dL (ref >39.00)
LDL Cholesterol: 109 mg/dL — ABNORMAL HIGH (ref 0–99)
NonHDL: 132.6
TRIGLYCERIDES: 119 mg/dL (ref 0.0–149.0)
VLDL: 23.8 mg/dL (ref 0.0–40.0)

## 2014-08-12 LAB — TSH: TSH: 1.42 u[IU]/mL (ref 0.35–4.50)

## 2014-08-12 NOTE — Progress Notes (Signed)
Pre visit review using our clinic review tool, if applicable. No additional management support is needed unless otherwise documented below in the visit note. 

## 2014-08-12 NOTE — Patient Instructions (Signed)
Thank you for choosing [facility].  Summary/Instructions:   Please stop by the lab prior to leaving for your blood work. We will be in touch with the results.   You should hear back from a [place] regarding your bone density testing.   Please let us know if you have any additional questions/concerns.   Fatigue Fatigue is a feeling of tiredness, lack of energy, lack of motivation, or feeling tired all the time. Having enough rest, good nutrition, and reducing stress will normally reduce fatigue. Consult your caregiver if it persists. The nature of your fatigue will help your caregiver to find out its cause. The treatment is based on the cause.  CAUSES  There are many causes for fatigue. Most of the time, fatigue can be traced to one or more of your habits or routines. Most causes fit into one or more of three general areas. They are: Lifestyle problems  Sleep disturbances.  Overwork.  Physical exertion.  Unhealthy habits.  Poor eating habits or eating disorders.  Alcohol and/or drug use .  Lack of proper nutrition (malnutrition). Psychological problems  Stress and/or anxiety problems.  Depression.  Grief.  Boredom. Medical Problems or Conditions  Anemia.  Pregnancy.  Thyroid gland problems.  Recovery from major surgery.  Continuous pain.  Emphysema or asthma that is not well controlled  Allergic conditions.  Diabetes.  Infections (such as mononucleosis).  Obesity.  Sleep disorders, such as sleep apnea.  Heart failure or other heart-related problems.  Cancer.  Kidney disease.  Liver disease.  Effects of certain medicines such as antihistamines, cough and cold remedies, prescription pain medicines, heart and blood pressure medicines, drugs used for treatment of cancer, and some antidepressants. SYMPTOMS  The symptoms of fatigue include:   Lack of energy.  Lack of drive (motivation).  Drowsiness.  Feeling of indifference to the  surroundings. DIAGNOSIS  The details of how you feel help guide your caregiver in finding out what is causing the fatigue. You will be asked about your present and past health condition. It is important to review all medicines that you take, including prescription and non-prescription items. A thorough exam will be done. You will be questioned about your feelings, habits, and normal lifestyle. Your caregiver may suggest blood tests, urine tests, or other tests to look for common medical causes of fatigue.  TREATMENT  Fatigue is treated by correcting the underlying cause. For example, if you have continuous pain or depression, treating these causes will improve how you feel. Similarly, adjusting the dose of certain medicines will help in reducing fatigue.  HOME CARE INSTRUCTIONS   Try to get the required amount of good sleep every night.  Eat a healthy and nutritious diet, and drink enough water throughout the day.  Practice ways of relaxing (including yoga or meditation).  Exercise regularly.  Make plans to change situations that cause stress. Act on those plans so that stresses decrease over time. Keep your work and personal routine reasonable.  Avoid street drugs and minimize use of alcohol.  Start taking a daily multivitamin after consulting your caregiver. SEEK MEDICAL CARE IF:   You have persistent tiredness, which cannot be accounted for.  You have fever.  You have unintentional weight loss.  You have headaches.  You have disturbed sleep throughout the night.  You are feeling sad.  You have constipation.  You have dry skin.  You have gained weight.  You are taking any new or different medicines that you suspect are causing fatigue.  You are unable to sleep at night.  You develop any unusual swelling of your legs or other parts of your body. SEEK IMMEDIATE MEDICAL CARE IF:   You are feeling confused.  Your vision is blurred.  You feel faint or pass out.  You  develop severe headache.  You develop severe abdominal, pelvic, or back pain.  You develop chest pain, shortness of breath, or an irregular or fast heartbeat.  You are unable to pass a normal amount of urine.  You develop abnormal bleeding such as bleeding from the rectum or you vomit blood.  You have thoughts about harming yourself or committing suicide.  You are worried that you might harm someone else. MAKE SURE YOU:   Understand these instructions.  Will watch your condition.  Will get help right away if you are not doing well or get worse. Document Released: 08/15/2007 Document Revised: 01/10/2012 Document Reviewed: 02/19/2014 [facility] Patient Information 2015 [place], [place]. This information is not intended to replace advice given to you by your health care provider. Make sure you discuss any questions you have with your health care provider.

## 2014-08-12 NOTE — Assessment & Plan Note (Addendum)
Prevnar and influenza vaccination completed. Will obtain bone density scan to check for osteoporosis. Provided anticipatory guidance regarding safety, diet, weight bearing exercise, preventative health maintenance.

## 2014-08-12 NOTE — Progress Notes (Signed)
Subjective:    Patient ID: Brenda Gibson, female    DOB: Jan 21, 1941, 73 y.o.   MRN: [id number]  HPI:  Brenda Gibson is a 73 y.o. female who presents today for her annual visit and to establish care.    Health Maintenance: Dental -- Up to date Vision -- previous cataract surgery last November. Will be due in November. Immunizations -- Tetanus, Flu shot done, Prevnar Colonoscopy -- Decline colonoscopy  Mammogram -- Declines mammogram.  Bone Density -- Will schedule  Have an issue with eating secondary to partial teeth. More difficult to chew. Does not eat a lot of red meat. Eggs oatmeal, soups, vegetables.   Exercise - Active getting out and going, does not do specific exercise. Had hip problem and stopped.    No Known Allergies  Past Medical History  Diagnosis Date  . Measles   . Varicella   . Myalgia   . Knee pain, bilateral   . Seborrheic dermatitis of scalp   . Basal cell carcinoma of ala nasi   . Cataract    Family History  Problem Relation Age of Onset  . Alcohol abuse Mother   . Cancer Father 77    stomach cancer  . Prostate cancer Father   . Diabetes Neg Hx   . Heart disease Neg Hx   . Hypertension Maternal Grandfather     Review of Systems General: Denies fever, chills, fatigue, or significant weight gain/loss. Skin: Denies rashes, lumps, itching, dryness, color changes, or hair/nail changes HENT:  Head: Denies headache or neck pain  Ears: Denies changes in hearing, ringing in ears, earache, drainage  Eyes: Denies loss/changes in vision, pain, redness, blurry/double vision, flashing  lights  Nose: Denies discharge, stuffiness, itching, nosebleed, sinus pain  Throat: Denies sore throat, hoarseness, dry mouth, sores, thrush Neck: Denies lumps, swollen glands, stiffness Breasts: Denies lumps, pain, discharge Respiratory: Denies shortness of breath, cough, sputum production, wheezing Cardiovascular: Denies chest pain/discomfort, tightness,  palpitations, shortness of breath with activity, difficulty lying down, swelling, sudden awakening with shortness of breath Gastrointestinal: Denies dysphasia, heartburn, change in appetite, nausea, change in bowel habits, rectal bleeding, constipation, diarrhea, yellow skin or eyes Intolerance to some foods gives diarrhea Urinary: Denies frequency, urgency, burning/pain, blood in urine, incontinence, change in urinary strength.  Vascular: Denies calf pain with walking and leg cramping. Musculoskeletal: Denies muscle/joint pain, stiffness, back pain, redness or swelling of joints, trauma Neurological: Denies dizziness, fainting, seizures, weakness, numbness, tingling, tremor Hematologic - Denies ease of bruising or bleeding Endocrine - Denies heat or cold intolerance, sweating, frequent urination, excessive thirst, changes in appetite Fatigue and some hair loss Psychiatric - Denies nervousness, stress, depression or memory loss.  Family stress - son moved in with her and has since moved out.     Objective:    BP 130/80  Pulse 62  Temp(Src) 98.5 F (36.9 C) (Oral)  Ht 5\' 5"  (1.651 m)  Wt 127 lb 1.9 oz (57.661 kg)  BMI 21.15 kg/m2  SpO2 98% Nursing note and vital signs reviewed.  Physical Exam  Constitutional: She is oriented to person, place, and time. She appears well-developed and well-nourished. No distress.  HENT:  Head: Normocephalic.  Right Ear: External ear normal.  Left Ear: External ear normal.  Mouth/Throat: Oropharynx is clear and moist.  Eyes: Conjunctivae are normal. Pupils are equal, round, and reactive to light.  Neck: Neck supple. No JVD present. No tracheal deviation present. No thyromegaly present.  Cardiovascular: Normal rate, regular rhythm  and normal heart sounds.   Pulmonary/Chest: Effort normal and breath sounds normal. Right breast exhibits no inverted nipple, no mass, no nipple discharge, no skin change and no tenderness. Left breast exhibits no inverted  nipple, no mass, no nipple discharge, no skin change and no tenderness.  Chaperone present for this exam.  Abdominal: Soft. Bowel sounds are normal. She exhibits no distension and no mass. There is no tenderness. There is no rebound and no guarding.  Musculoskeletal: Normal range of motion.  Lymphadenopathy:    She has no cervical adenopathy.  Neurological: She is alert and oriented to person, place, and time. She has normal reflexes. No cranial nerve deficit. Coordination normal.  Skin: Skin is warm and dry.  Psychiatric: She has a normal mood and affect. Her behavior is normal. Judgment and thought content normal.        Assessment & Plan:

## 2014-08-12 NOTE — Assessment & Plan Note (Signed)
Not currently on any medications. BP remains at goal and is stable at present time. No current changes needed.

## 2014-08-12 NOTE — Assessment & Plan Note (Signed)
Obtain TSH, CBC, BMET. Will follow up pending results. Encouarged to continue eating a balanced diet of fruits and vegetables and getting sleep.

## 2014-08-13 ENCOUNTER — Telehealth: Payer: Self-pay | Admitting: Family

## 2014-08-13 DIAGNOSIS — F259 Schizoaffective disorder, unspecified: Secondary | ICD-10-CM | POA: Diagnosis not present

## 2014-08-13 NOTE — Telephone Encounter (Signed)
Called pt to let her know she had normal lab results and that she will be hearing about scheduling her bone density test soon.

## 2014-08-13 NOTE — Telephone Encounter (Signed)
Called pt and left a vm to call back regarding lab results.

## 2014-08-13 NOTE — Telephone Encounter (Signed)
Please call [name] and let her know that lab work that we did yesterday was all within the expected ranges. She should be hearing back about scheduling her bone density testing shortly.

## 2014-08-19 ENCOUNTER — Ambulatory Visit (INDEPENDENT_AMBULATORY_CARE_PROVIDER_SITE_OTHER)
Admission: RE | Admit: 2014-08-19 | Discharge: 2014-08-19 | Disposition: A | Discharge status: Home / Self Care | Payer: Medicare Other | Source: Ambulatory Visit | Attending: Family | Admitting: Family

## 2014-08-19 DIAGNOSIS — M810 Age-related osteoporosis without current pathological fracture: Secondary | ICD-10-CM | POA: Diagnosis not present

## 2014-08-22 ENCOUNTER — Telehealth: Payer: Self-pay | Admitting: Family

## 2014-08-22 NOTE — Telephone Encounter (Signed)
Please call to inform the patient that the results of her bone density indicates that she has osteopenia which means her bone mineral density is low. There is no drug therapy recommended at the current level, however it is recommended to consume 1200 mg of Calcium and 800 International Units (IU) daily and attempt to get weight bearing exercise like walking. With these results, we will follow up again in 2 years to ensure she is maintaining and not losing bone mass.

## 2014-08-23 NOTE — Telephone Encounter (Signed)
Called pt to let her know the results of bone density which indicated she has osteopenia. Gave her instruction to take 1200 mg of Calcium a day and told her to try to walk on a regular basis. Told her to follow up in 2 years to check to see if no bone mass was lost.

## 2014-09-03 ENCOUNTER — Encounter: Payer: Self-pay | Admitting: Family

## 2014-09-03 ENCOUNTER — Ambulatory Visit (INDEPENDENT_AMBULATORY_CARE_PROVIDER_SITE_OTHER): Payer: Medicare Other | Admitting: Family

## 2014-09-03 VITALS — BP 164/88 | HR 58 | Temp 97.5°F | Resp 18 | Ht 65.0 in | Wt 126.1 lb

## 2014-09-03 DIAGNOSIS — N8110 Cystocele, unspecified: Secondary | ICD-10-CM | POA: Diagnosis not present

## 2014-09-03 DIAGNOSIS — N329 Bladder disorder, unspecified: Secondary | ICD-10-CM | POA: Insufficient documentation

## 2014-09-03 NOTE — Progress Notes (Signed)
   Subjective:    Patient ID: LEGEND PECORE, female    DOB: August 09, 1941, 73 y.o.   MRN: [id number]  Chief Complaint  Patient presents with  . Bladder issues    Feels like there is "tissue" near the opening of the vagina, no urinary frequency or any abnormalities with urinating    HPI:  Brenda Gibson is a 73 y.o. female who presents today for bladder issues.  Feels like there is a sack coming down into her vagina. This feeling started last weekend which noted that she had some burning after wiping. Felt there was some tissue that was not supposed to be there. Denies any treatments. After a nights sleep there seemed to be less pressure, as opposed laying down. Have had urinary frequency in the past.   No Known Allergies  Current Outpatient Prescriptions on File Prior to Visit  Medication Sig Dispense Refill  . celecoxib (CELEBREX) 200 MG capsule Take 1 capsule (200 mg total) by mouth 2 (two) times daily. 30 capsule 5  . Clobetasol Propionate (CLOBEX) 0.05 % shampoo Apply 10 mLs topically daily as needed. 118 mL 0   No current facility-administered medications on file prior to visit.    Review of Systems    See HPI  Objective:    BP 164/88 mmHg  Pulse 58  Temp(Src) 97.5 F (36.4 C) (Oral)  Resp 18  Ht 5\' 5"  (1.651 m)  Wt 126 lb 1.9 oz (57.208 kg)  BMI 20.99 kg/m2  SpO2 97% Nursing note and vital signs reviewed.  Physical Exam  Constitutional: She is oriented to person, place, and time. No distress.  Thin elderly female who appears older than her stated age and is dressed appropriately.   Cardiovascular: Normal rate, regular rhythm and normal heart sounds.   Pulmonary/Chest: Effort normal and breath sounds normal.  Genitourinary: No erythema, tenderness or bleeding in the vagina. No foreign body around the vagina. No signs of injury around the vagina. No vaginal discharge found.  External vaginal exam without abnormality. Palpable bladder noted upon coughing and urethra  appears slightly lower. Rectovaginal wall intact and appropriate.   Neurological: She is alert and oriented to person, place, and time.  Skin: Skin is warm and dry.  Psychiatric: She has a normal mood and affect. Her behavior is normal. Judgment and thought content normal.       Assessment & Plan:

## 2014-09-03 NOTE — Assessment & Plan Note (Deleted)
Symptoms and exam consistent with potential prolapsed bladder. Refer to GYN for further management. Pt in agreement with the plan. Follow up if symptoms worsen or fail to improve before appointment.

## 2014-09-03 NOTE — Assessment & Plan Note (Signed)
Symptoms and exam consistent with potential prolapsed bladder. Refer to GYN for further management. Pt in agreement with the plan. Follow up if symptoms worsen or fail to improve before appointment.

## 2014-09-03 NOTE — Progress Notes (Signed)
Pre visit review using our clinic review tool, if applicable. No additional management support is needed unless otherwise documented below in the visit note. 

## 2014-09-03 NOTE — Patient Instructions (Addendum)
Thank you for choosing [facility].  Summary/Instructions:   We have made a referral to GYN for you. You should hear back in less than a week, if you have not, please let us know.

## 2014-09-04 ENCOUNTER — Telehealth: Payer: Self-pay | Admitting: Family

## 2014-09-04 NOTE — Telephone Encounter (Signed)
emmi emailed °

## 2014-09-10 ENCOUNTER — Telehealth: Payer: Self-pay | Admitting: Family

## 2014-09-10 ENCOUNTER — Other Ambulatory Visit: Payer: Self-pay

## 2014-09-10 NOTE — Telephone Encounter (Signed)
Patient would like to know if they are going to schedule her to see a Gyn doctor/ and for a colon screening. Please advise

## 2014-09-12 NOTE — Telephone Encounter (Signed)
Paperwork and referrals are in process.

## 2014-09-20 DIAGNOSIS — N8110 Cystocele, unspecified: Secondary | ICD-10-CM | POA: Diagnosis not present

## 2014-10-03 DIAGNOSIS — N819 Female genital prolapse, unspecified: Secondary | ICD-10-CM | POA: Diagnosis not present

## 2014-10-29 DIAGNOSIS — F259 Schizoaffective disorder, unspecified: Secondary | ICD-10-CM | POA: Diagnosis not present

## 2015-02-17 DIAGNOSIS — Z1212 Encounter for screening for malignant neoplasm of rectum: Secondary | ICD-10-CM | POA: Diagnosis not present

## 2015-02-17 DIAGNOSIS — Z1211 Encounter for screening for malignant neoplasm of colon: Secondary | ICD-10-CM | POA: Diagnosis not present

## 2015-02-17 LAB — COLOGUARD: COLOGUARD: NEGATIVE

## 2015-02-26 ENCOUNTER — Telehealth: Payer: Self-pay | Admitting: Family

## 2015-02-26 NOTE — Telephone Encounter (Signed)
Pt aware of results 

## 2015-02-26 NOTE — Telephone Encounter (Signed)
Have you heard anything about pts colonguard test?

## 2015-02-26 NOTE — Telephone Encounter (Signed)
Cologuard test was received today and was negative.

## 2015-02-26 NOTE — Telephone Encounter (Signed)
Pt called in and is requesting results of her color guard  test?

## 2015-03-05 DIAGNOSIS — F259 Schizoaffective disorder, unspecified: Secondary | ICD-10-CM | POA: Diagnosis not present

## 2015-03-10 ENCOUNTER — Encounter: Payer: Self-pay | Admitting: Family

## 2015-04-10 DIAGNOSIS — F259 Schizoaffective disorder, unspecified: Secondary | ICD-10-CM | POA: Diagnosis not present

## 2015-05-02 ENCOUNTER — Telehealth: Payer: Self-pay

## 2015-05-02 NOTE — Telephone Encounter (Signed)
Called patient to see if a recent mammogram has been done. Patient states that she has not had any recent mammograms and wishes to decline any orders at this time.

## 2015-05-15 DIAGNOSIS — F259 Schizoaffective disorder, unspecified: Secondary | ICD-10-CM | POA: Diagnosis not present

## 2015-07-08 ENCOUNTER — Encounter: Payer: Self-pay | Admitting: Internal Medicine

## 2015-07-08 ENCOUNTER — Ambulatory Visit (INDEPENDENT_AMBULATORY_CARE_PROVIDER_SITE_OTHER): Payer: Medicare Other | Admitting: Internal Medicine

## 2015-07-08 ENCOUNTER — Other Ambulatory Visit: Payer: Medicare Other

## 2015-07-08 VITALS — BP 134/80 | HR 59 | Temp 98.1°F | Ht 65.0 in | Wt 127.0 lb

## 2015-07-08 DIAGNOSIS — I10 Essential (primary) hypertension: Secondary | ICD-10-CM

## 2015-07-08 DIAGNOSIS — N8110 Cystocele, unspecified: Secondary | ICD-10-CM | POA: Diagnosis not present

## 2015-07-08 DIAGNOSIS — R300 Dysuria: Secondary | ICD-10-CM | POA: Diagnosis not present

## 2015-07-08 LAB — POCT URINALYSIS DIPSTICK
Bilirubin, UA: NEGATIVE
Blood, UA: NEGATIVE
GLUCOSE UA: NEGATIVE
Ketones, UA: NEGATIVE
NITRITE UA: NEGATIVE
Protein, UA: NEGATIVE
Spec Grav, UA: 1.025
Urobilinogen, UA: 0.2
pH, UA: 6

## 2015-07-08 MED ORDER — CEPHALEXIN 500 MG PO CAPS: 500.0000 mg | ORAL_CAPSULE | Freq: Four times a day (QID) | ORAL | Status: DC

## 2015-07-08 NOTE — Assessment & Plan Note (Signed)
stable overall by history and exam, recent data reviewed with pt, and pt to continue medical treatment as before,  to f/u any worsening symptoms or concerns BP Readings from Last 3 Encounters:  07/08/15 134/80  09/03/14 164/88  08/12/14 130/80

## 2015-07-08 NOTE — Assessment & Plan Note (Signed)
With mild incontinence, but not recurring UTI - declines urology referral at this time

## 2015-07-08 NOTE — Assessment & Plan Note (Signed)
Likely UTI - for urine cx, also cephalexin asd,  to f/u any worsening symptoms or concerns

## 2015-07-08 NOTE — Patient Instructions (Signed)
Please take all new medication as prescribed - the antibiotic  Your specimen will also be sent for culture today  Please continue all other medications as before, and refills have been done if requested.  Please have the pharmacy call with any other refills you may need.  Please keep your appointments with your specialists as you may have planned

## 2015-07-08 NOTE — Progress Notes (Signed)
Pre visit review using our clinic review tool, if applicable. No additional management support is needed unless otherwise documented below in the visit note. 

## 2015-07-08 NOTE — Progress Notes (Signed)
   Subjective:    Patient ID: Brenda Gibson, female    DOB: 1941-06-21, 74 y.o.   MRN: [id number]  HPI Here with 2 days onset dysuria, frequency, urgency, but no flank pain, hematuria or n/v, fever, chills. Pt denies chest pain, increased sob or doe, wheezing, orthopnea, PND, increased LE swelling, palpitations, dizziness or syncope.  Not confused today.  Has ongoing intermittent incontinence, but no recurring UTI so far.   Past Medical History  Diagnosis Date  . Measles   . Varicella   . Myalgia   . Knee pain, bilateral   . Seborrheic dermatitis of scalp   . Basal cell carcinoma of ala nasi   . Cataract    Past Surgical History  Procedure Laterality Date  . Cataract extraction w/ intraocular lens implant      right eye. '09 (Dr. Dolores Lory)  . Basal cell carcinoma excision      '11 ([facility])    reports that she has been smoking Cigarettes.  She has a 31.2 pack-year smoking history. She has never used smokeless tobacco. She reports that she drinks alcohol. She reports that she does not use illicit drugs. family history includes Alcohol abuse in her mother; Cancer (age of onset: 77) in her father; Hypertension in her maternal grandfather; Prostate cancer in her father. There is no history of Diabetes or Heart disease. No Known Allergies Current Outpatient Prescriptions on File Prior to Visit  Medication Sig Dispense Refill  . celecoxib (CELEBREX) 200 MG capsule Take 1 capsule (200 mg total) by mouth 2 (two) times daily. (Patient not taking: Reported on 07/08/2015) 30 capsule 5  . Clobetasol Propionate (CLOBEX) 0.05 % shampoo Apply 10 mLs topically daily as needed. (Patient not taking: Reported on 07/08/2015) 118 mL 0   No current facility-administered medications on file prior to visit.   Review of Systems  Constitutional: Negative for unusual diaphoresis or night sweats HENT: Negative for ringing in ear or discharge Eyes: Negative for double vision or worsening visual disturbance.    Respiratory: Negative for choking and stridor.   Gastrointestinal: Negative for vomiting or other signifcant bowel change Genitourinary: Negative for hematuria or change in urine volume.  Musculoskeletal: Negative for other MSK pain or swelling Skin: Negative for color change and worsening wound.  Neurological: Negative for tremors and numbness other than noted  Psychiatric/Behavioral: Negative for decreased concentration or agitation other than above       Objective:   Physical Exam BP 134/80 mmHg  Pulse 59  Temp(Src) 98.1 F (36.7 C) (Oral)  Ht 5\' 5"  (1.651 m)  Wt 127 lb (57.607 kg)  BMI 21.13 kg/m2  SpO2 98% VS noted,  Constitutional: Pt appears in no significant distress HENT: Head: NCAT.  Right Ear: External ear normal.  Left Ear: External ear normal.  Eyes: . Pupils are equal, round, and reactive to light. Conjunctivae and EOM are normal Neck: Normal range of motion. Neck supple.  Cardiovascular: Normal rate and regular rhythm.   Pulmonary/Chest: Effort normal and breath sounds without rales or wheezing.  Abd:  Soft, NT, ND, + BS, with no flank tender Neurological: Pt is alert. Not confused , motor grossly intact Skin: Skin is warm. No rash, no LE edema Psychiatric: Pt behavior is normal. No agitation.     Assessment & Plan:

## 2015-07-10 DIAGNOSIS — F259 Schizoaffective disorder, unspecified: Secondary | ICD-10-CM | POA: Diagnosis not present

## 2015-07-10 LAB — URINE CULTURE: Colony Count: 50000

## 2015-07-26 DIAGNOSIS — Z23 Encounter for immunization: Secondary | ICD-10-CM | POA: Diagnosis not present

## 2015-08-13 DIAGNOSIS — F259 Schizoaffective disorder, unspecified: Secondary | ICD-10-CM | POA: Diagnosis not present

## 2015-10-15 DIAGNOSIS — F259 Schizoaffective disorder, unspecified: Secondary | ICD-10-CM | POA: Diagnosis not present

## 2015-11-20 DIAGNOSIS — F259 Schizoaffective disorder, unspecified: Secondary | ICD-10-CM | POA: Diagnosis not present

## 2016-01-05 DIAGNOSIS — F259 Schizoaffective disorder, unspecified: Secondary | ICD-10-CM | POA: Diagnosis not present

## 2016-02-03 DIAGNOSIS — F259 Schizoaffective disorder, unspecified: Secondary | ICD-10-CM | POA: Diagnosis not present

## 2016-02-23 ENCOUNTER — Other Ambulatory Visit (INDEPENDENT_AMBULATORY_CARE_PROVIDER_SITE_OTHER): Payer: Medicare Other

## 2016-02-23 ENCOUNTER — Encounter: Payer: Self-pay | Admitting: Family

## 2016-02-23 ENCOUNTER — Ambulatory Visit (INDEPENDENT_AMBULATORY_CARE_PROVIDER_SITE_OTHER): Payer: Medicare Other | Admitting: Family

## 2016-02-23 VITALS — BP 124/82 | HR 64 | Resp 14 | Ht 65.0 in | Wt 123.0 lb

## 2016-02-23 DIAGNOSIS — Z0000 Encounter for general adult medical examination without abnormal findings: Secondary | ICD-10-CM

## 2016-02-23 DIAGNOSIS — I10 Essential (primary) hypertension: Secondary | ICD-10-CM

## 2016-02-23 LAB — COMPREHENSIVE METABOLIC PANEL
ALT: 12 U/L (ref 0–35)
AST: 15 U/L (ref 0–37)
Albumin: 4.2 g/dL (ref 3.5–5.2)
Alkaline Phosphatase: 59 U/L (ref 39–117)
BILIRUBIN TOTAL: 0.5 mg/dL (ref 0.2–1.2)
BUN: 9 mg/dL (ref 6–23)
CO2: 29 meq/L (ref 19–32)
CREATININE: 0.61 mg/dL (ref 0.40–1.20)
Calcium: 9.5 mg/dL (ref 8.4–10.5)
Chloride: 103 mEq/L (ref 96–112)
GFR: 102.22 mL/min (ref >60.00)
GLUCOSE: 101 mg/dL — AB (ref 70–99)
Potassium: 4.1 mEq/L (ref 3.5–5.1)
Sodium: 138 mEq/L (ref 135–145)
Total Protein: 7 g/dL (ref 6.0–8.3)

## 2016-02-23 LAB — CBC
HCT: 42.4 % (ref 36.0–46.0)
Hemoglobin: 14 g/dL (ref 12.0–15.0)
MCHC: 33.1 g/dL (ref 30.0–36.0)
MCV: 85.9 fl (ref 78.0–100.0)
Platelets: 310 10*3/uL (ref 150.0–400.0)
RBC: 4.94 Mil/uL (ref 3.87–5.11)
RDW: 14.9 % (ref 11.5–15.5)
WBC: 9.1 10*3/uL (ref 4.0–10.5)

## 2016-02-23 NOTE — Progress Notes (Signed)
Pre visit review using our clinic review tool, if applicable. No additional management support is needed unless otherwise documented below in the visit note. 

## 2016-02-23 NOTE — Assessment & Plan Note (Signed)
Reviewed and updated patient's medical, surgical, family and social history. Medications and allergies were also reviewed. Basic screenings for depression, activities of daily living, hearing, cognition and safety were performed. Provider list was updated and health plan was provided to the patient.   1) Anticipatory Guidance: Discussed importance of wearing a seatbelt while driving and not texting while driving; changing batteries in smoke detector at least once annually; wearing suntan lotion when outside; eating a balanced and moderate diet; getting physical activity at least 30 minutes per day.  2) Immunizations / Screenings / Labs:  All immunizations are up to date per recommendations. Due for a mammogram which patient declines. All other screenings are up to date per recommendations. Obtain CBC and CMET.  Overall well exam with risk factors for cardiovascular disease including hypertension and tobacco use. Hypertension is well-controlled lifestyle management. Discussed importance of tobacco cessation and continued risk associated with cardiovascular and respiratory disease. Patient is not ready to quit smoking at this time. Continue other healthy lifestyle behaviors and choices. Emphasize a nutritional intake that is moderate, varied, and balanced. Follow-up prevention exam in 1 year. Follow-up office visit for chronic conditions pending blood work.
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
## 2016-02-23 NOTE — Patient Instructions (Signed)
Thank you for choosing [facility].  Summary/Instructions:  Please stop by the lab on the basement level of the building for your blood work. Your results will be released to MyChart (or called to you) after review, usually within 72 hours after test completion. If any changes need to be made, you will be notified at that same time.  Health Maintenance  Topic Date Due  . MAMMOGRAM  07/16/2016 (Originally 11/02/2010)  . INFLUENZA VACCINE  06/01/2016  . Fecal DNA (Cologuard)  02/24/2018  . TETANUS/TDAP  08/12/2024  . DEXA SCAN  Completed  . ZOSTAVAX  Completed  . PNA vac Low Risk Adult  Completed   Health Maintenance, Female Adopting a healthy lifestyle and getting preventive care can go a long way to promote health and wellness. Talk with your health care provider about what schedule of regular examinations is right for you. This is a good chance for you to check in with your provider about disease prevention and staying healthy. In between checkups, there are plenty of things you can do on your own. Experts have done a lot of research about which lifestyle changes and preventive measures are most likely to keep you healthy. Ask your health care provider for more information. WEIGHT AND DIET  Eat a healthy diet  Be sure to include plenty of vegetables, fruits, low-fat dairy products, and lean protein.  Do not eat a lot of foods high in solid fats, added sugars, or salt.  Get regular exercise. This is one of the most important things you can do for your health.  Most adults should exercise for at least 150 minutes each week. The exercise should increase your heart rate and make you sweat (moderate-intensity exercise).  Most adults should also do strengthening exercises at least twice a week. This is in addition to the moderate-intensity exercise.  Maintain a healthy weight  Body mass index (BMI) is a measurement that can be used to identify possible weight problems. It estimates  body fat based on height and weight. Your health care provider can help determine your BMI and help you achieve or maintain a healthy weight.  For females 32 years of age and older:   A BMI below 18.5 is considered underweight.  A BMI of 18.5 to 24.9 is normal.  A BMI of 25 to 29.9 is considered overweight.  A BMI of 30 and above is considered obese.  Watch levels of cholesterol and blood lipids  You should start having your blood tested for lipids and cholesterol at 73 years of age, then have this test every 5 years.  You may need to have your cholesterol levels checked more often if:  Your lipid or cholesterol levels are high.  You are older than 73 years of age.  You are at high risk for heart disease.  CANCER SCREENING   Lung Cancer  Lung cancer screening is recommended for adults 21-52 years old who are at high risk for lung cancer because of a history of smoking.  A yearly low-dose CT scan of the lungs is recommended for people who:  Currently smoke.  Have quit within the past 15 years.  Have at least a 30-pack-year history of smoking. A pack year is smoking an average of one pack of cigarettes a day for 1 year.  Yearly screening should continue until it has been 15 years since you quit.  Yearly screening should stop if you develop a health problem that would prevent you from having lung cancer  treatment.  Breast Cancer  Practice breast self-awareness. This means understanding how your breasts normally appear and feel.  It also means doing regular breast self-exams. Let your health care provider know about any changes, no matter how small.  If you are in your 20s or 30s, you should have a clinical breast exam (CBE) by a health care provider every 1-3 years as part of a regular health exam.  If you are 47 or older, have a CBE every year. Also consider having a breast X-ray (mammogram) every year.  If you have a family history of breast cancer, talk to your  health care provider about genetic screening.  If you are at high risk for breast cancer, talk to your health care provider about having an MRI and a mammogram every year.  Breast cancer gene (BRCA) assessment is recommended for women who have family members with BRCA-related cancers. BRCA-related cancers include:  Breast.  Ovarian.  Tubal.  Peritoneal cancers.  Results of the assessment will determine the need for genetic counseling and BRCA1 and BRCA2 testing. Cervical Cancer Your health care provider may recommend that you be screened regularly for cancer of the pelvic organs (ovaries, uterus, and vagina). This screening involves a pelvic examination, including checking for microscopic changes to the surface of your cervix (Pap test). You may be encouraged to have this screening done every 3 years, beginning at age 96.  For women ages 66-65, health care providers may recommend pelvic exams and Pap testing every 3 years, or they may recommend the Pap and pelvic exam, combined with testing for human papilloma virus (HPV), every 5 years. Some types of HPV increase your risk of cervical cancer. Testing for HPV may also be done on women of any age with unclear Pap test results.  Other health care providers may not recommend any screening for nonpregnant women who are considered low risk for pelvic cancer and who do not have symptoms. Ask your health care provider if a screening pelvic exam is right for you.  If you have had past treatment for cervical cancer or a condition that could lead to cancer, you need Pap tests and screening for cancer for at least 20 years after your treatment. If Pap tests have been discontinued, your risk factors (such as having a new sexual partner) need to be reassessed to determine if screening should resume. Some women have medical problems that increase the chance of getting cervical cancer. In these cases, your health care provider may recommend more frequent  screening and Pap tests. Colorectal Cancer  This type of cancer can be detected and often prevented.  Routine colorectal cancer screening usually begins at 73 years of age and continues through 73 years of age.  Your health care provider may recommend screening at an earlier age if you have risk factors for colon cancer.  Your health care provider may also recommend using home test kits to check for hidden blood in the stool.  A small camera at the end of a tube can be used to examine your colon directly (sigmoidoscopy or colonoscopy). This is done to check for the earliest forms of colorectal cancer.  Routine screening usually begins at age 34.  Direct examination of the colon should be repeated every 5-10 years through 73 years of age. However, you may need to be screened more often if early forms of precancerous polyps or small growths are found. Skin Cancer  Check your skin from head to toe regularly.  Tell your  health care provider about any new moles or changes in moles, especially if there is a change in a mole's shape or color.  Also tell your health care provider if you have a mole that is larger than the size of a pencil eraser.  Always use sunscreen. Apply sunscreen liberally and repeatedly throughout the day.  Protect yourself by wearing long sleeves, pants, a wide-brimmed hat, and sunglasses whenever you are outside. HEART DISEASE, DIABETES, AND HIGH BLOOD PRESSURE   High blood pressure causes heart disease and increases the risk of stroke. High blood pressure is more likely to develop in:  People who have blood pressure in the high end of the normal range (130-139/85-89 mm Hg).  People who are overweight or obese.  People who are African American.  If you are 16-44 years of age, have your blood pressure checked every 3-5 years. If you are 60 years of age or older, have your blood pressure checked every year. You should have your blood pressure measured twice--once  when you are at a hospital or clinic, and once when you are not at a hospital or clinic. Record the average of the two measurements. To check your blood pressure when you are not at a hospital or clinic, you can use:  An automated blood pressure machine at a pharmacy.  A home blood pressure monitor.  If you are between 54 years and 45 years old, ask your health care provider if you should take aspirin to prevent strokes.  Have regular diabetes screenings. This involves taking a blood sample to check your fasting blood sugar level.  If you are at a normal weight and have a low risk for diabetes, have this test once every three years after 73 years of age.  If you are overweight and have a high risk for diabetes, consider being tested at a younger age or more often. PREVENTING INFECTION  Hepatitis B  If you have a higher risk for hepatitis B, you should be screened for this virus. You are considered at high risk for hepatitis B if:  You were born in a country where hepatitis B is common. Ask your health care provider which countries are considered high risk.  Your parents were born in a high-risk country, and you have not been immunized against hepatitis B (hepatitis B vaccine).  You have HIV or AIDS.  You use needles to inject street drugs.  You live with someone who has hepatitis B.  You have had sex with someone who has hepatitis B.  You get hemodialysis treatment.  You take certain medicines for conditions, including cancer, organ transplantation, and autoimmune conditions. Hepatitis C  Blood testing is recommended for:  Everyone born from [birth year] through 1963.  Anyone with known risk factors for hepatitis C. Sexually transmitted infections (STIs)  You should be screened for sexually transmitted infections (STIs) including gonorrhea and chlamydia if:  You are sexually active and are younger than 73 years of age.  You are older than 73 years of age and your health care  provider tells you that you are at risk for this type of infection.  Your sexual activity has changed since you were last screened and you are at an increased risk for chlamydia or gonorrhea. Ask your health care provider if you are at risk.  If you do not have HIV, but are at risk, it may be recommended that you take a prescription medicine daily to prevent HIV infection. This is called pre-exposure prophylaxis (PrEP).  You are considered at risk if:  You are sexually active and do not regularly use condoms or know the HIV status of your partner(s).  You take drugs by injection.  You are sexually active with a partner who has HIV. Talk with your health care provider about whether you are at high risk of being infected with HIV. If you choose to begin PrEP, you should first be tested for HIV. You should then be tested every 3 months for as long as you are taking PrEP.  PREGNANCY   If you are premenopausal and you may become pregnant, ask your health care provider about preconception counseling.  If you may become pregnant, take 400 to 800 micrograms (mcg) of folic acid every day.  If you want to prevent pregnancy, talk to your health care provider about birth control (contraception). OSTEOPOROSIS AND MENOPAUSE   Osteoporosis is a disease in which the bones lose minerals and strength with aging. This can result in serious bone fractures. Your risk for osteoporosis can be identified using a bone density scan.  If you are 60 years of age or older, or if you are at risk for osteoporosis and fractures, ask your health care provider if you should be screened.  Ask your health care provider whether you should take a calcium or vitamin D supplement to lower your risk for osteoporosis.  Menopause may have certain physical symptoms and risks.  Hormone replacement therapy may reduce some of these symptoms and risks. Talk to your health care provider about whether hormone replacement therapy is  right for you.  HOME CARE INSTRUCTIONS   Schedule regular health, dental, and eye exams.  Stay current with your immunizations.   Do not use any tobacco products including cigarettes, chewing tobacco, or electronic cigarettes.  If you are pregnant, do not drink alcohol.  If you are breastfeeding, limit how much and how often you drink alcohol.  Limit alcohol intake to no more than 1 drink per day for nonpregnant women. One drink equals 12 ounces of beer, 5 ounces of wine, or 1 ounces of hard liquor.  Do not use street drugs.  Do not share needles.  Ask your health care provider for help if you need support or information about quitting drugs.  Tell your health care provider if you often feel depressed.  Tell your health care provider if you have ever been abused or do not feel safe at home.   This information is not intended to replace advice given to you by your health care provider. Make sure you discuss any questions you have with your health care provider.   Document Released: 05/03/2011 Document Revised: 11/08/2014 Document Reviewed: 09/19/2013 Elsevier Interactive Patient Education 2016 [employer].  Smoking Hazards Smoking cigarettes is extremely bad for your health. Tobacco smoke has over 200 known poisons in it. It contains the poisonous gases nitrogen oxide and carbon monoxide. There are over 60 chemicals in tobacco smoke that cause cancer. Some of the chemicals found in cigarette smoke include:   Cyanide.   Benzene.   Formaldehyde.   Methanol (wood alcohol).   Acetylene (fuel used in welding torches).   Ammonia.  Even smoking lightly shortens your life expectancy by several years. You can greatly reduce the risk of medical problems for you and your family by stopping now. Smoking is the most preventable cause of death and disease in our society. Within days of quitting smoking, your circulation improves, you decrease the risk of having a heart attack,  and your lung capacity improves. There may be some increased phlegm in the first few days after quitting, and it may take months for your lungs to clear up completely. Quitting for 10 years reduces your risk of developing lung cancer to almost that of a nonsmoker.  WHAT ARE THE RISKS OF SMOKING? Cigarette smokers have an increased risk of many serious medical problems, including:  Lung cancer.   Lung disease (such as pneumonia, bronchitis, and emphysema).   Heart attack and chest pain due to the heart not getting enough oxygen (angina).   Heart disease and peripheral blood vessel disease.   Hypertension.   Stroke.   Oral cancer (cancer of the lip, mouth, or voice box).   Bladder cancer.   Pancreatic cancer.   Cervical cancer.   Pregnancy complications, including premature birth.   Stillbirths and smaller newborn babies, birth defects, and genetic damage to sperm.   Early menopause.   Lower estrogen level for women.   Infertility.   Facial wrinkles.   Blindness.   Increased risk of broken bones (fractures).   Senile dementia.   Stomach ulcers and internal bleeding.   Delayed wound healing and increased risk of complications during surgery. Because of secondhand smoke exposure, children of smokers have an increased risk of the following:   Sudden infant death syndrome (SIDS).   Respiratory infections.   Lung cancer.   Heart disease.   Ear infections.  WHY IS SMOKING ADDICTIVE? Nicotine is the chemical agent in tobacco that is capable of causing addiction or dependence. When you smoke and inhale, nicotine is absorbed rapidly into the bloodstream through your lungs. Both inhaled and noninhaled nicotine may be addictive.  WHAT ARE THE BENEFITS OF QUITTING?  There are many health benefits to quitting smoking. Some are:   The likelihood of developing cancer and heart disease decreases. Health improvements are seen almost immediately.    Blood pressure, pulse rate, and breathing patterns start returning to normal soon after quitting.   People who quit may see an improvement in their overall quality of life.  HOW DO YOU QUIT SMOKING? Smoking is an addiction with both physical and psychological effects, and longtime habits can be hard to change. Your health care provider can recommend:  Programs and community resources, which may include group support, education, or therapy.  Replacement products, such as patches, gum, and nasal sprays. Use these products only as directed. Do not replace cigarette smoking with electronic cigarettes (commonly called e-cigarettes). The safety of e-cigarettes is unknown, and some may contain harmful chemicals. FOR MORE INFORMATION  American Lung Association: www.lung.org  American Cancer Society: www.cancer.org   This information is not intended to replace advice given to you by your health care provider. Make sure you discuss any questions you have with your health care provider.   Document Released: 11/25/2004 Document Revised: 08/08/2013 Document Reviewed: 04/09/2013 Elsevier Interactive Patient Education 2016 [employer].  Steps to Quit Smoking  Smoking tobacco can be harmful to your health and can affect almost every organ in your body. Smoking puts you, and those around you, at risk for developing many serious chronic diseases. Quitting smoking is difficult, but it is one of the best things that you can do for your health. It is never too late to quit. WHAT ARE THE BENEFITS OF QUITTING SMOKING? When you quit smoking, you lower your risk of developing serious diseases and conditions, such as:  Lung cancer or lung disease, such as COPD.  Heart disease.  Stroke.  Heart attack.  Infertility.  Osteoporosis and bone fractures. Additionally, symptoms such as coughing, wheezing, and shortness of breath may get better when you quit. You may also find that you get sick less often  because your body is stronger at fighting off colds and infections. If you are pregnant, quitting smoking can help to reduce your chances of having a baby of low birth weight. HOW DO I GET READY TO QUIT? When you decide to quit smoking, create a plan to make sure that you are successful. Before you quit:  Pick a date to quit. Set a date within the next two weeks to give you time to prepare.  Write down the reasons why you are quitting. Keep this list in places where you will see it often, such as on your bathroom mirror or in your car or wallet.  Identify the people, places, things, and activities that make you want to smoke (triggers) and avoid them. Make sure to take these actions:  Throw away all cigarettes at home, at work, and in your car.  Throw away smoking accessories, such as Scientist, research (medical).  Clean your car and make sure to empty the ashtray.  Clean your home, including curtains and carpets.  Tell your family, friends, and coworkers that you are quitting. Support from your loved ones can make quitting easier.  Talk with your health care provider about your options for quitting smoking.  Find out what treatment options are covered by your health insurance. WHAT STRATEGIES CAN I USE TO QUIT SMOKING?  Talk with your healthcare provider about different strategies to quit smoking. Some strategies include:  Quitting smoking altogether instead of gradually lessening how much you smoke over a period of time. Research shows that quitting "cold [place]" is more successful than gradually quitting.  Attending in-person counseling to help you build problem-solving skills. You are more likely to have success in quitting if you attend several counseling sessions. Even short sessions of 10 minutes can be effective.  Finding resources and support systems that can help you to quit smoking and remain smoke-free after you quit. These resources are most helpful when you use them often. They  can include:  Online chats with a Social worker.  Telephone quitlines.  Printed Furniture conservator/restorer.  Support groups or group counseling.  Text messaging programs.  Mobile phone applications.  Taking medicines to help you quit smoking. (If you are pregnant or breastfeeding, talk with your health care provider first.) Some medicines contain nicotine and some do not. Both types of medicines help with cravings, but the medicines that include nicotine help to relieve withdrawal symptoms. Your health care provider may recommend:  Nicotine patches, gum, or lozenges.  Nicotine inhalers or sprays.  Non-nicotine medicine that is taken by mouth. Talk with your health care provider about combining strategies, such as taking medicines while you are also receiving in-person counseling. Using these two strategies together makes you more likely to succeed in quitting than if you used either strategy on its own. If you are pregnant or breastfeeding, talk with your health care provider about finding counseling or other support strategies to quit smoking. Do not take medicine to help you quit smoking unless told to do so by your health care provider. WHAT THINGS CAN I DO TO MAKE IT EASIER TO QUIT? Quitting smoking might feel overwhelming at first, but there is a lot that you can do to make it easier. Take these important actions:  Reach out to your family and friends  and ask that they support and encourage you during this time. Call telephone quitlines, reach out to support groups, or work with a counselor for support.  Ask people who smoke to avoid smoking around you.  Avoid places that trigger you to smoke, such as bars, parties, or smoke-break areas at work.  Spend time around people who do not smoke.  Lessen stress in your life, because stress can be a smoking trigger for some people. To lessen stress, try:  Exercising regularly.  Deep-breathing exercises.  Yoga.  Meditating.  Performing a  body scan. This involves closing your eyes, scanning your body from head to toe, and noticing which parts of your body are particularly tense. Purposefully relax the muscles in those areas.  Download or purchase mobile phone or tablet apps (applications) that can help you stick to your quit plan by providing reminders, tips, and encouragement. There are many free apps, such as QuitGuide from the [employer] Office manager for Disease Control and Prevention). You can find other support for quitting smoking (smoking cessation) through smokefree.gov and other websites. HOW WILL I FEEL WHEN I QUIT SMOKING? Within the first 24 hours of quitting smoking, you may start to feel some withdrawal symptoms. These symptoms are usually most noticeable 2-3 days after quitting, but they usually do not last beyond 2-3 weeks. Changes or symptoms that you might experience include:  Mood swings.  Restlessness, anxiety, or irritation.  Difficulty concentrating.  Dizziness.  Strong cravings for sugary foods in addition to nicotine.  Mild weight gain.  Constipation.  Nausea.  Coughing or a sore throat.  Changes in how your medicines work in your body.  A depressed mood.  Difficulty sleeping (insomnia). After the first 2-3 weeks of quitting, you may start to notice more positive results, such as:  Improved sense of smell and taste.  Decreased coughing and sore throat.  Slower heart rate.  Lower blood pressure.  Clearer skin.  The ability to breathe more easily.  Fewer sick days. Quitting smoking is very challenging for most people. Do not get discouraged if you are not successful the first time. Some people need to make many attempts to quit before they achieve long-term success. Do your best to stick to your quit plan, and talk with your health care provider if you have any questions or concerns.   This information is not intended to replace advice given to you by your health care provider. Make sure you  discuss any questions you have with your health care provider.   Document Released: 10/12/2001 Document Revised: 03/04/2015 Document Reviewed: 03/04/2015 Elsevier Interactive Patient Education 2016 [employer].  Smoking Cessation, Tips for Success If you are ready to quit smoking, congratulations! You have chosen to help yourself be healthier. Cigarettes bring nicotine, tar, carbon monoxide, and other irritants into your body. Your lungs, heart, and blood vessels will be able to work better without these poisons. There are many different ways to quit smoking. Nicotine gum, nicotine patches, a nicotine inhaler, or nicotine nasal spray can help with physical craving. Hypnosis, support groups, and medicines help break the habit of smoking. WHAT THINGS CAN I DO TO MAKE QUITTING EASIER?  Here are some tips to help you quit for good:  Pick a date when you will quit smoking completely. Tell all of your friends and family about your plan to quit on that date.  Do not try to slowly cut down on the number of cigarettes you are smoking. Pick a quit date and quit  smoking completely starting on that day.  Throw away all cigarettes.   Clean and remove all ashtrays from your home, work, and car.  On a card, write down your reasons for quitting. Carry the card with you and read it when you get the urge to smoke.  Cleanse your body of nicotine. Drink enough water and fluids to keep your urine clear or pale yellow. Do this after quitting to flush the nicotine from your body.  Learn to predict your moods. Do not let a bad situation be your excuse to have a cigarette. Some situations in your life might tempt you into wanting a cigarette.  Never have "just one" cigarette. It leads to wanting another and another. Remind yourself of your decision to quit.  Change habits associated with smoking. If you smoked while driving or when feeling stressed, try other activities to replace smoking. Stand up when drinking  your coffee. Brush your teeth after eating. Sit in a different chair when you read the paper. Avoid alcohol while trying to quit, and try to drink fewer caffeinated beverages. Alcohol and caffeine may urge you to smoke.  Avoid foods and drinks that can trigger a desire to smoke, such as sugary or spicy foods and alcohol.  Ask people who smoke not to smoke around you.  Have something planned to do right after eating or having a cup of coffee. For example, plan to take a walk or exercise.  Try a relaxation exercise to calm you down and decrease your stress. Remember, you may be tense and nervous for the first 2 weeks after you quit, but this will pass.  Find new activities to keep your hands busy. Play with a pen, coin, or rubber band. Doodle or draw things on paper.  Brush your teeth right after eating. This will help cut down on the craving for the taste of tobacco after meals. You can also try mouthwash.   Use oral substitutes in place of cigarettes. Try using lemon drops, carrots, cinnamon sticks, or chewing gum. Keep them handy so they are available when you have the urge to smoke.  When you have the urge to smoke, try deep breathing.  Designate your home as a nonsmoking area.  If you are a heavy smoker, ask your health care provider about a prescription for nicotine chewing gum. It can ease your withdrawal from nicotine.  Reward yourself. Set aside the cigarette money you save and buy yourself something nice.  Look for support from others. Join a support group or smoking cessation program. Ask someone at home or at work to help you with your plan to quit smoking.  Always ask yourself, "Do I need this cigarette or is this just a reflex?" Tell yourself, "Today, I choose not to smoke," or "I do not want to smoke." You are reminding yourself of your decision to quit.  Do not replace cigarette smoking with electronic cigarettes (commonly called e-cigarettes). The safety of e-cigarettes is  unknown, and some may contain harmful chemicals.  If you relapse, do not give up! Plan ahead and think about what you will do the next time you get the urge to smoke. HOW WILL I FEEL WHEN I QUIT SMOKING? You may have symptoms of withdrawal because your body is used to nicotine (the addictive substance in cigarettes). You may crave cigarettes, be irritable, feel very hungry, cough often, get headaches, or have difficulty concentrating. The withdrawal symptoms are only temporary. They are strongest when you first quit but  will go away within 10-14 days. When withdrawal symptoms occur, stay in control. Think about your reasons for quitting. Remind yourself that these are signs that your body is healing and getting used to being without cigarettes. Remember that withdrawal symptoms are easier to treat than the major diseases that smoking can cause.  Even after the withdrawal is over, expect periodic urges to smoke. However, these cravings are generally short lived and will go away whether you smoke or not. Do not smoke! WHAT RESOURCES ARE AVAILABLE TO HELP ME QUIT SMOKING? Your health care provider can direct you to community resources or hospitals for support, which may include:  Group support.  Education.  Hypnosis.  Therapy.   This information is not intended to replace advice given to you by your health care provider. Make sure you discuss any questions you have with your health care provider.   Document Released: 07/16/2004 Document Revised: 11/08/2014 Document Reviewed: 04/05/2013 Elsevier Interactive Patient Education [employer].

## 2016-03-02 DIAGNOSIS — F259 Schizoaffective disorder, unspecified: Secondary | ICD-10-CM | POA: Diagnosis not present

## 2016-03-24 DIAGNOSIS — F259 Schizoaffective disorder, unspecified: Secondary | ICD-10-CM | POA: Diagnosis not present

## 2016-04-28 DIAGNOSIS — F259 Schizoaffective disorder, unspecified: Secondary | ICD-10-CM | POA: Diagnosis not present

## 2016-06-19 ENCOUNTER — Encounter (HOSPITAL_COMMUNITY): Payer: Self-pay | Admitting: Emergency Medicine

## 2016-06-19 ENCOUNTER — Emergency Department (HOSPITAL_COMMUNITY): Payer: Medicare Other

## 2016-06-19 ENCOUNTER — Inpatient Hospital Stay (HOSPITAL_COMMUNITY)
Admission: EM | Admit: 2016-06-19 | Discharge: 2016-07-07 | DRG: 336 | Disposition: A | Discharge status: Home Health | Payer: Medicare Other | Attending: Internal Medicine | Admitting: Internal Medicine

## 2016-06-19 DIAGNOSIS — Z4659 Encounter for fitting and adjustment of other gastrointestinal appliance and device: Secondary | ICD-10-CM

## 2016-06-19 DIAGNOSIS — K565 Intestinal adhesions [bands] with obstruction (postprocedural) (postinfection): Secondary | ICD-10-CM | POA: Diagnosis not present

## 2016-06-19 DIAGNOSIS — Z8249 Family history of ischemic heart disease and other diseases of the circulatory system: Secondary | ICD-10-CM | POA: Diagnosis not present

## 2016-06-19 DIAGNOSIS — E876 Hypokalemia: Secondary | ICD-10-CM | POA: Diagnosis not present

## 2016-06-19 DIAGNOSIS — K567 Ileus, unspecified: Secondary | ICD-10-CM | POA: Diagnosis not present

## 2016-06-19 DIAGNOSIS — I10 Essential (primary) hypertension: Secondary | ICD-10-CM | POA: Diagnosis not present

## 2016-06-19 DIAGNOSIS — R339 Retention of urine, unspecified: Secondary | ICD-10-CM | POA: Diagnosis not present

## 2016-06-19 DIAGNOSIS — Z85828 Personal history of other malignant neoplasm of skin: Secondary | ICD-10-CM | POA: Diagnosis not present

## 2016-06-19 DIAGNOSIS — N3080 Other cystitis without hematuria: Secondary | ICD-10-CM | POA: Diagnosis present

## 2016-06-19 DIAGNOSIS — K9189 Other postprocedural complications and disorders of digestive system: Secondary | ICD-10-CM | POA: Diagnosis not present

## 2016-06-19 DIAGNOSIS — K5669 Other intestinal obstruction: Secondary | ICD-10-CM | POA: Diagnosis not present

## 2016-06-19 DIAGNOSIS — E440 Moderate protein-calorie malnutrition: Secondary | ICD-10-CM | POA: Diagnosis not present

## 2016-06-19 DIAGNOSIS — K8020 Calculus of gallbladder without cholecystitis without obstruction: Secondary | ICD-10-CM | POA: Diagnosis not present

## 2016-06-19 DIAGNOSIS — Z800 Family history of malignant neoplasm of digestive organs: Secondary | ICD-10-CM

## 2016-06-19 DIAGNOSIS — M7989 Other specified soft tissue disorders: Secondary | ICD-10-CM | POA: Diagnosis not present

## 2016-06-19 DIAGNOSIS — R030 Elevated blood-pressure reading, without diagnosis of hypertension: Secondary | ICD-10-CM | POA: Diagnosis not present

## 2016-06-19 DIAGNOSIS — K913 Postprocedural intestinal obstruction: Secondary | ICD-10-CM | POA: Diagnosis not present

## 2016-06-19 DIAGNOSIS — K529 Noninfective gastroenteritis and colitis, unspecified: Secondary | ICD-10-CM | POA: Diagnosis present

## 2016-06-19 DIAGNOSIS — R1030 Lower abdominal pain, unspecified: Secondary | ICD-10-CM | POA: Diagnosis not present

## 2016-06-19 DIAGNOSIS — E8809 Other disorders of plasma-protein metabolism, not elsewhere classified: Secondary | ICD-10-CM | POA: Diagnosis not present

## 2016-06-19 DIAGNOSIS — E878 Other disorders of electrolyte and fluid balance, not elsewhere classified: Secondary | ICD-10-CM | POA: Diagnosis present

## 2016-06-19 DIAGNOSIS — R05 Cough: Secondary | ICD-10-CM | POA: Diagnosis not present

## 2016-06-19 DIAGNOSIS — Z6825 Body mass index (BMI) 25.0-25.9, adult: Secondary | ICD-10-CM

## 2016-06-19 DIAGNOSIS — R140 Abdominal distension (gaseous): Secondary | ICD-10-CM | POA: Diagnosis not present

## 2016-06-19 DIAGNOSIS — R109 Unspecified abdominal pain: Secondary | ICD-10-CM | POA: Diagnosis not present

## 2016-06-19 DIAGNOSIS — R1084 Generalized abdominal pain: Secondary | ICD-10-CM | POA: Diagnosis not present

## 2016-06-19 DIAGNOSIS — R338 Other retention of urine: Secondary | ICD-10-CM | POA: Diagnosis not present

## 2016-06-19 DIAGNOSIS — R112 Nausea with vomiting, unspecified: Secondary | ICD-10-CM | POA: Diagnosis not present

## 2016-06-19 DIAGNOSIS — Z0189 Encounter for other specified special examinations: Secondary | ICD-10-CM

## 2016-06-19 DIAGNOSIS — E8770 Fluid overload, unspecified: Secondary | ICD-10-CM | POA: Diagnosis not present

## 2016-06-19 DIAGNOSIS — Z720 Tobacco use: Secondary | ICD-10-CM | POA: Diagnosis present

## 2016-06-19 DIAGNOSIS — N9989 Other postprocedural complications and disorders of genitourinary system: Secondary | ICD-10-CM | POA: Diagnosis not present

## 2016-06-19 DIAGNOSIS — K56609 Unspecified intestinal obstruction, unspecified as to partial versus complete obstruction: Secondary | ICD-10-CM

## 2016-06-19 DIAGNOSIS — F17210 Nicotine dependence, cigarettes, uncomplicated: Secondary | ICD-10-CM | POA: Diagnosis present

## 2016-06-19 DIAGNOSIS — Z4682 Encounter for fitting and adjustment of non-vascular catheter: Secondary | ICD-10-CM | POA: Diagnosis not present

## 2016-06-19 DIAGNOSIS — K5660 Unspecified intestinal obstruction: Secondary | ICD-10-CM | POA: Diagnosis not present

## 2016-06-19 LAB — CBC WITH DIFFERENTIAL/PLATELET
BASOS ABS: 0 10*3/uL (ref 0.0–0.1)
Basophils Relative: 0 %
EOS PCT: 0 %
Eosinophils Absolute: 0 10*3/uL (ref 0.0–0.7)
HCT: 39.2 % (ref 36.0–46.0)
Hemoglobin: 13.1 g/dL (ref 12.0–15.0)
LYMPHS PCT: 14 %
Lymphs Abs: 1.9 10*3/uL (ref 0.7–4.0)
MCH: 28.4 pg (ref 26.0–34.0)
MCHC: 33.4 g/dL (ref 30.0–36.0)
MCV: 85 fL (ref 78.0–100.0)
Monocytes Absolute: 0.3 10*3/uL (ref 0.1–1.0)
Monocytes Relative: 2 %
Neutro Abs: 11.6 10*3/uL — ABNORMAL HIGH (ref 1.7–7.7)
Neutrophils Relative %: 84 %
PLATELETS: 275 10*3/uL (ref 150–400)
RBC: 4.61 MIL/uL (ref 3.87–5.11)
RDW: 15 % (ref 11.5–15.5)
WBC: 13.8 10*3/uL — ABNORMAL HIGH (ref 4.0–10.5)

## 2016-06-19 LAB — I-STAT CG4 LACTIC ACID, ED
Lactic Acid, Venous: 1.53 mmol/L (ref 0.5–1.9)
Lactic Acid, Venous: 1.16 mmol/L (ref 0.5–1.9)

## 2016-06-19 LAB — URINALYSIS, ROUTINE W REFLEX MICROSCOPIC
Bilirubin Urine: NEGATIVE
Glucose, UA: NEGATIVE mg/dL
Hgb urine dipstick: NEGATIVE
KETONES UR: NEGATIVE mg/dL
NITRITE: NEGATIVE
PH: 6.5 (ref 5.0–8.0)
Protein, ur: NEGATIVE mg/dL
Specific Gravity, Urine: 1.042 — ABNORMAL HIGH (ref 1.005–1.030)

## 2016-06-19 LAB — COMPREHENSIVE METABOLIC PANEL
ALT: 14 U/L (ref 14–54)
AST: 17 U/L (ref 15–41)
Albumin: 3.9 g/dL (ref 3.5–5.0)
Alkaline Phosphatase: 62 U/L (ref 38–126)
Anion gap: 9 (ref 5–15)
BUN: 13 mg/dL (ref 6–20)
CO2: 26 mmol/L (ref 22–32)
CREATININE: 0.56 mg/dL (ref 0.44–1.00)
Calcium: 9.4 mg/dL (ref 8.9–10.3)
Chloride: 103 mmol/L (ref 101–111)
GFR calc Af Amer: >60 mL/min (ref >60)
GFR calc non Af Amer: >60 mL/min (ref >60)
Glucose, Bld: 162 mg/dL — ABNORMAL HIGH (ref 65–99)
Potassium: 3.7 mmol/L (ref 3.5–5.1)
SODIUM: 138 mmol/L (ref 135–145)
Total Bilirubin: 0.6 mg/dL (ref 0.3–1.2)
Total Protein: 6.9 g/dL (ref 6.5–8.1)

## 2016-06-19 LAB — URINE MICROSCOPIC-ADD ON

## 2016-06-19 LAB — URINALYSIS, DIPSTICK ONLY
Bilirubin Urine: NEGATIVE
GLUCOSE, UA: NEGATIVE mg/dL
Hgb urine dipstick: NEGATIVE
Ketones, ur: NEGATIVE mg/dL
Nitrite: NEGATIVE
Protein, ur: NEGATIVE mg/dL
Specific Gravity, Urine: 1.042 — ABNORMAL HIGH (ref 1.005–1.030)
pH: 6.5 (ref 5.0–8.0)

## 2016-06-19 LAB — LIPASE, BLOOD: Lipase: 24 U/L (ref 11–51)

## 2016-06-19 MED ORDER — ONDANSETRON HCL 4 MG PO TABS
4.0000 mg | ORAL_TABLET | Freq: Four times a day (QID) | ORAL | Status: DC | PRN
  Administered 2016-06-21: 4 mg via ORAL
  Filled 2016-06-19: qty 1

## 2016-06-19 MED ORDER — TRIFLUOPERAZINE HCL 5 MG PO TABS
5.0000 mg | ORAL_TABLET | Freq: Every evening | ORAL | Status: DC
  Administered 2016-06-19: 5 mg via ORAL
  Filled 2016-06-19: qty 1

## 2016-06-19 MED ORDER — SODIUM CHLORIDE 0.9 % IV SOLN
INTRAVENOUS | Status: AC
  Administered 2016-06-19: 13:00:00 via INTRAVENOUS

## 2016-06-19 MED ORDER — IOPAMIDOL (ISOVUE-300) INJECTION 61%: 100.0000 mL | Freq: Once | INTRAVENOUS | Status: DC | PRN

## 2016-06-19 MED ORDER — IOPAMIDOL (ISOVUE-M 300) INJECTION 61%: 15.0000 mL | Freq: Once | INTRAMUSCULAR | Status: DC | PRN

## 2016-06-19 MED ORDER — DEXTROSE 5 % IV SOLN
1.0000 g | Freq: Once | INTRAVENOUS | Status: AC
  Administered 2016-06-19: 1 g via INTRAVENOUS
  Filled 2016-06-19: qty 10

## 2016-06-19 MED ORDER — ONDANSETRON HCL 4 MG/2ML IJ SOLN
4.0000 mg | Freq: Once | INTRAMUSCULAR | Status: AC
  Administered 2016-06-19: 4 mg via INTRAVENOUS
  Filled 2016-06-19: qty 2

## 2016-06-19 MED ORDER — DIATRIZOATE MEGLUMINE & SODIUM 66-10 % PO SOLN
15.0000 mL | Freq: Once | ORAL | Status: AC
  Administered 2016-06-19: 15 mL via ORAL

## 2016-06-19 MED ORDER — DEXTROSE 5 % IV SOLN
1.0000 g | INTRAVENOUS | Status: DC
  Administered 2016-06-20 – 2016-06-26 (×7): 1 g via INTRAVENOUS
  Filled 2016-06-19 (×7): qty 10

## 2016-06-19 MED ORDER — SODIUM CHLORIDE 0.9 % IV BOLUS (SEPSIS)
1000.0000 mL | Freq: Once | INTRAVENOUS | Status: AC
  Administered 2016-06-19: 1000 mL via INTRAVENOUS

## 2016-06-19 MED ORDER — POLYETHYLENE GLYCOL 3350 17 G PO PACK
17.0000 g | PACK | Freq: Every day | ORAL | Status: DC | PRN
  Administered 2016-06-19: 17 g via ORAL
  Filled 2016-06-19: qty 1

## 2016-06-19 MED ORDER — IOPAMIDOL (ISOVUE-300) INJECTION 61%
100.0000 mL | Freq: Once | INTRAVENOUS | Status: AC | PRN
  Administered 2016-06-19: 100 mL via INTRAVENOUS

## 2016-06-19 MED ORDER — TRAMADOL HCL 50 MG PO TABS
50.0000 mg | ORAL_TABLET | Freq: Four times a day (QID) | ORAL | Status: DC | PRN
  Administered 2016-06-19 – 2016-06-22 (×6): 50 mg via ORAL
  Filled 2016-06-19 (×6): qty 1

## 2016-06-19 MED ORDER — ONDANSETRON HCL 4 MG/2ML IJ SOLN
4.0000 mg | Freq: Four times a day (QID) | INTRAMUSCULAR | Status: DC | PRN
  Administered 2016-06-19 – 2016-07-02 (×9): 4 mg via INTRAVENOUS
  Filled 2016-06-19 (×8): qty 2

## 2016-06-19 NOTE — Progress Notes (Signed)
Patient from ED. Alert and oriented x 3. All orders acknowledged. Patient oriented to room and environment. Placed comfortably in bed. Vital signs obtained. Will continue to monitor.

## 2016-06-19 NOTE — ED Provider Notes (Signed)
[facility] DEPT Provider Note   CSN: [id number] Arrival date & time: 06/19/16  [id number]     History   Chief Complaint Chief Complaint  Patient presents with  . Abdominal Pain    HPI Brenda Gibson is a 75 y.o. female.  Patient presents with lower abdominal pain that woke her from sleep around 1 AM. Pain is constant, nothing makes it better or worse. Associated with 3 episodes of nonbloody nonbilious emesis. No diarrhea. No fever. No urinary symptoms. Denies any sick contacts. Patient at chicken salad which she has eaten for the past several days. Denies any previous history of this, stomach pain. No previous abdominal surgeries. No vaginal bleeding or discharge. No urinary symptoms. No back pain or chest pain. No fever.   The history is provided by the patient.  Abdominal Pain   Associated symptoms include nausea and vomiting. Pertinent negatives include fever, diarrhea, dysuria, hematuria, headaches and myalgias.    Past Medical History:  Diagnosis Date  . Basal cell carcinoma of ala nasi   . Cataract   . Knee pain, bilateral   . Measles   . Myalgia   . Seborrheic dermatitis of scalp   . Varicella     Patient Active Problem List   Diagnosis Date Noted  . Medicare annual wellness visit, subsequent 02/23/2016  . Dysuria 07/08/2015  . Bladder problem 09/03/2014  . Female bladder prolapse 09/03/2014  . Fatigue 08/12/2014  . Routine general medical examination at a health care facility 08/12/2014  . At risk for osteoporosis/osteopenia 08/12/2014  . Overactive bladder 08/02/2013  . Essential hypertension, benign 06/05/2013  . 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 Pain in left hip 06/05/2013  . Wrist pain 02/22/2013  . Ganglion cyst of wrist 02/22/2013  . Tobacco abuse 02/24/2011    Past Surgical History:  Procedure Laterality Date  . BASAL CELL CARCINOMA EXCISION     '11 [facility])  . CATARACT EXTRACTION W/ INTRAOCULAR LENS IMPLANT     right eye. '09 (Dr. Dolores Lory)     OB History    No data available       Home Medications    Prior to Admission medications   Medication Sig Start Date End Date Taking? Authorizing Provider  trifluoperazine (STELAZINE) 5 MG tablet Take 1 tablet by mouth every evening. 06/04/16  Yes Historical Provider, MD    Family History Family History  Problem Relation Age of Onset  . Alcohol abuse Mother   . Cancer Father 39    stomach cancer  . Prostate cancer Father   . Hypertension Maternal Grandfather   . Diabetes Neg Hx   . Heart disease Neg Hx     Social History Social History  Substance Use Topics  . Smoking status: Current Every Day Smoker    Packs/day: 0.60    Years: 52.00    Types: Cigarettes  . Smokeless tobacco: Never Used     Comment: Pt smokes 5 cigarettes per day  . Alcohol use Yes     Comment: rare use     Allergies   Review of patient's allergies indicates no known allergies.   Review of Systems Review of Systems  Constitutional: Positive for activity change, appetite change and fatigue. Negative for fever.  HENT: Negative for congestion.   Eyes: Negative for visual disturbance.  Respiratory: Negative for cough, chest tightness and shortness of breath.   Cardiovascular: Negative for chest pain.  Gastrointestinal: Positive for abdominal pain, nausea and vomiting. Negative for diarrhea.  Genitourinary: Negative  for dysuria, hematuria, vaginal bleeding and vaginal discharge.  Musculoskeletal: Negative for back pain and myalgias.  Neurological: Negative for dizziness, weakness and headaches.   A complete 10 system review of systems was obtained and all systems are negative except as noted in the HPI and PMH.    Physical Exam Updated Vital Signs BP 173/62   Pulse 66   Temp 97.6 F (36.4 C) (Oral)   Resp 20   Ht 5' 5.5" (1.664 m)   Wt 120 lb (54.4 kg)   SpO2 96%   BMI 19.67 kg/m   Physical Exam  Constitutional: She is oriented to person, place, and time. She appears  well-developed and well-nourished. No distress.  uncomfortable  HENT:  Head: Normocephalic and atraumatic.  Mouth/Throat: Oropharynx is clear and moist. No oropharyngeal exudate.  Eyes: Conjunctivae and EOM are normal. Pupils are equal, round, and reactive to light.  Neck: Normal range of motion. Neck supple.  No meningismus.  Cardiovascular: Normal rate, regular rhythm, normal heart sounds and intact distal pulses.   No murmur heard. Pulmonary/Chest: Effort normal and breath sounds normal. No respiratory distress.  Abdominal: Soft. There is tenderness. There is no rebound and no guarding.  Abdomen is tender diffusely worse lower quadrants. Voluntary guarding throughout, no rebound  Musculoskeletal: Normal range of motion. She exhibits no edema or tenderness.  No CVAT  Neurological: She is alert and oriented to person, place, and time. No cranial nerve deficit. She exhibits normal muscle tone. Coordination normal.   5/5 strength throughout. CN 2-12 intact.Equal grip strength.   Skin: Skin is warm.  Psychiatric: She has a normal mood and affect. Her behavior is normal.  Nursing note and vitals reviewed.    ED Treatments / Results  Labs (all labs ordered are listed, but only abnormal results are displayed) Labs Reviewed  CBC WITH DIFFERENTIAL/PLATELET - Abnormal; Notable for the following:       Result Value   WBC 13.8 (*)    Neutro Abs 11.6 (*)    All other components within normal limits  COMPREHENSIVE METABOLIC PANEL - Abnormal; Notable for the following:    Glucose, Bld 162 (*)    All other components within normal limits  URINALYSIS, DIPSTICK ONLY - Abnormal; Notable for the following:    Specific Gravity, Urine 1.042 (*)    Leukocytes, UA MODERATE (*)    All other components within normal limits  URINALYSIS, ROUTINE W REFLEX MICROSCOPIC (NOT AT [facility]) - Abnormal; Notable for the following:    Specific Gravity, Urine 1.042 (*)    Leukocytes, UA MODERATE (*)    All other  components within normal limits  URINE MICROSCOPIC-ADD ON - Abnormal; Notable for the following:    Squamous Epithelial / LPF 6-30 (*)    Bacteria, UA FEW (*)    All other components within normal limits  URINE CULTURE  URINE CULTURE  LIPASE, BLOOD  I-STAT CG4 LACTIC ACID, ED  I-STAT CG4 LACTIC ACID, ED    EKG  EKG Interpretation None       Radiology Ct Abdomen Pelvis W Contrast  Result Date: 06/19/2016 CLINICAL DATA:  Abdominal pain with nausea and vomiting for 1 day EXAM: CT ABDOMEN AND PELVIS WITH CONTRAST TECHNIQUE: Multidetector CT imaging of the abdomen and pelvis was performed using the standard protocol following bolus administration of intravenous contrast. Oral contrast was also administered. CONTRAST:  162mL ISOVUE-300 IOPAMIDOL (ISOVUE-300) INJECTION 61% COMPARISON:  Abdomen series June 19, 2016 FINDINGS: Lower chest: There is focal atelectatic change  in the posterior right lung base region. Lung bases otherwise are clear. A small amount of gastroesophageal reflux is noted into the distal esophagus. Hepatobiliary: There is hepatic steatosis. No focal liver lesions are evident. There is a gallstone within the gallbladder. The gallbladder wall is not thickened. There is no biliary duct dilatation. Pancreas: There is no pancreatic mass or inflammatory focus. Spleen: No splenic lesions are evident. Adrenals/Urinary Tract: Adrenals appear normal bilaterally. There is no appreciable renal mass or hydronephrosis on either side. There is no renal or ureteral calculus on either side. Urinary bladder wall is normal in thickness. Urinary bladder is midline. A small focus of air is noted within the urinary bladder anteriorly. A mild degree of urinary bladder prolapse is evident. Stomach/Bowel: There are several loops of proximal to mid jejunum which show mild wall thickening. There is no focal bowel obstruction evident on this study. No free air or portal venous air. Mesentery does not appear  thickened. Vascular/Lymphatic: There is atherosclerotic calcification in the aorta and iliac arteries. There is no demonstrable abdominal aortic aneurysm. The major mesenteric vessels appear patent. There is no adenopathy in the abdomen or pelvis. Reproductive: Uterus appears absent. There is no pelvic mass or pelvic fluid collection. Other: There is no appendiceal region lesion. No abscess is seen in the abdomen or pelvis. A small amount of ascites is noted in the dependent portion of the pelvis. No ascites noted elsewhere. Musculoskeletal: There is degenerative change in the lumbar spine and hip joint regions. There are no blastic or lytic bone lesions. There is no intramuscular or abdominal wall lesion. IMPRESSION: Small focus of air is noted in the urinary bladder. Unless the patient has had recent instrumentation in this area, infectious etiology is felt to be most likely for air within the urinary bladder. Urinary bladder wall is not thickened. No renal or ureteral calculi. No hydronephrosis. There is a small gallstone in the gallbladder. The gallbladder wall does not appear appreciably thickened. There is hepatic steatosis. There is mild wall thickening involving several loops of proximal to mid jejunum without bowel obstruction. Suspect a degree of jejunal enteritis. The surrounding mesenteric does not appear appreciably thickened. Mild ascites in the dependent portion of the pelvis. This finding may be due to the bowel inflammation noted in the left abdomen. Evidence of a degree of gastroesophageal reflux. Extensive aortic atherosclerosis. Evidence of a degree of urinary bladder prolapse. Electronically Signed   By: Lowella Grip III M.D.   On: 06/19/2016 09:22   Dg Abdomen Acute W/chest  Result Date: 06/19/2016 CLINICAL DATA:  Abdominal pain with nausea and vomiting EXAM: DG ABDOMEN ACUTE W/ 1V CHEST COMPARISON:  None. FINDINGS: PA chest: No edema or consolidation. The heart size and pulmonary  vascularity are normal. No adenopathy. Supine and upright abdomen: There is no appreciable bowel dilatation. There are occasional air-fluid levels on the left. No free air. There are phleboliths in the pelvis. IMPRESSION: Scattered air-fluid levels without bowel dilatation. This finding may be indicative of early ileus or enteritis. Obstruction is not felt to be likely. No free air. Lungs clear. Electronically Signed   By: Lowella Grip III M.D.   On: 06/19/2016 08:22    Procedures Procedures (including critical care time)  Medications Ordered in ED Medications  iopamidol (ISOVUE-M) 61 % intrathecal injection 15 mL (not administered)  cefTRIAXone (ROCEPHIN) 1 g in dextrose 5 % 50 mL IVPB (not administered)  sodium chloride 0.9 % bolus 1,000 mL (0 mLs Intravenous Stopped 06/19/16  [id number])  ondansetron [facility]) injection 4 mg (4 mg Intravenous Given 06/19/16 0731)  diatrizoate meglumine-sodium (GASTROGRAFIN) 66-10 % solution 15 mL (15 mLs Oral Given 06/19/16 0831)  iopamidol (ISOVUE-300) 61 % injection 100 mL (100 mLs Intravenous Contrast Given 06/19/16 0856)     Initial Impression / Assessment and Plan / ED Course  I have reviewed the triage vital signs and the nursing notes.  Pertinent labs & imaging results that were available during my care of the patient were reviewed by me and considered in my medical decision making (see chart for details).  Clinical Course   Acute onset of lower abdominal pain with nausea and vomiting. Voluntary guarding on exam. We'll obtain plain film, give IV fluids check labs and urinalysis.  Urinalysis appears infected. Labs show leukocytosis with normal lactate. Acute abdominal series does not show any bowel traction.  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 No bowel obstruction is seen. Does have enteritis but denies diarrhea.   Start IV Rocephin and admit Does not appear to be septic.   Admission d/w Dr. Venetia Constable.   Final Clinical Impressions(s) / ED  Diagnoses   Final diagnoses:  Emphysematous cystitis    New Prescriptions New Prescriptions   No medications on file     Ezequiel Essex, MD 06/19/16 1640

## 2016-06-19 NOTE — H&P (Signed)
History and Physical    LILIEN MARKSBERRY [id number] DOB: 1941-03-25 DOA: 06/19/2016  PCP: [name], FNP  Patient coming from: home  Chief Complaint: Abd pain  HPI: JIZEL FERONE is a 75 y.o. female with medical history significant of no significant past medical history the comes into the hospital for suprapubic pain that started on the day of admission, she related this or carotid on 1 AM the pain is constant nothing makes it better or worse, she relates episodes of vomiting, relates no diarrhea no fevers. Denies any sick contacts. She denies any dysuria,bloody emesis.  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  Review of Systems: As per HPI otherwise 10 point review of systems negative.   Past Medical History:  Diagnosis Date  . Basal cell carcinoma of [place]   . Cataract   . Knee pain, bilateral   . Measles   . Myalgia   . Seborrheic dermatitis of scalp   . Varicella     Past Surgical History:  Procedure Laterality Date  . BASAL CELL CARCINOMA EXCISION     '11 Ally[facility] Sabal)  . CATARACT EXTRACTION W/ INTRAOCULAR LENS IMPLANT     right eye. '09 (Dr. Dolores Lory)     reports that she has been smoking Cigarettes.  She has a 31.20 pack-year smoking history. She has never used smokeless tobacco. She reports that she drinks alcohol. She reports that she does not use drugs.  No Known Allergies  Family History  Problem Relation Age of Onset  . Alcohol abuse Mother   . Cancer Father 19    stomach cancer  . Prostate cancer Father   . Hypertension Maternal Grandfather   . Diabetes Neg Hx   . Heart disease Neg Hx     Prior to Admission medications   Medication Sig Start Date End Date Taking? Authorizing Provider  trifluoperazine (STELAZINE) 5 MG tablet Take 1 tablet by mouth every evening. 06/04/16  Yes  Historical Provider, MD    Physical Exam: Vitals:   06/19/16 1030 06/19/16 1100 06/19/16 1132 06/19/16 1200  BP: 173/62 148/72 175/76 165/80  Pulse: 66 65 71 61  Resp:      Temp:      TempSrc:      SpO2: 96% 96% 94% 95%  Weight:      Height:          Constitutional: NAD, calm, comfortable Vitals:   06/19/16 1030 06/19/16 1100 06/19/16 1132 06/19/16 1200  BP: 173/62 148/72 175/76 165/80  Pulse: 66 65 71 61  Resp:      Temp:      TempSrc:      SpO2: 96% 96% 94% 95%  Weight:      Height:       Eyes: PERRL, lids and conjunctivae normal ENMT: Mucous membranes are moist. Posterior pharynx clear of any exudate or lesions.Normal dentition.  Neck: normal, supple, no masses, no thyromegaly Respiratory: clear to auscultation bilaterally, no wheezing, no crackles. Normal respiratory effort. No accessory muscle use.  Cardiovascular: Regular rate and rhythm, no murmurs / rubs / gallops. No extremity edema. 2+ pedal pulses. No carotid bruits.  Abdomen: no masses palpated. Exquisitely tender with no rebound some voluntary guarding. Musculoskeletal: no clubbing / cyanosis. No joint deformity upper and lower extremities. Good ROM, no contractures. Normal muscle tone.  Skin: no rashes, lesions, ulcers. No induration Neurologic: CN 2-12 grossly intact. Sensation intact, DTR normal. Strength 5/5 in all 4.  Psychiatric: Normal judgment and insight. Alert and oriented x 3. Normal mood.   Labs on Admission: I have personally reviewed following labs and imaging studies  CBC:  Recent Labs Lab 06/19/16 0739  WBC 13.8*  NEUTROABS 11.6*  HGB 13.1  HCT 39.2  MCV 85.0  PLT 123XX123   Basic Metabolic Panel:  Recent Labs Lab 06/19/16 0739  NA 138  K 3.7  CL 103  CO2 26  GLUCOSE 162*  BUN 13  CREATININE 0.56  CALCIUM 9.4   GFR: Estimated Creatinine Clearance: 53.8 mL/min (by C-G formula based on SCr of 0.8 mg/dL). Liver Function Tests:  Recent Labs Lab 06/19/16 0739  AST 17  ALT  14  ALKPHOS 62  BILITOT 0.6  PROT 6.9  ALBUMIN 3.9    Recent Labs Lab 06/19/16 0739  LIPASE 24   No results for input(s): AMMONIA in the last 168 hours. Coagulation Profile: No results for input(s): INR, PROTIME in the last 168 hours. Cardiac Enzymes: No results for input(s): CKTOTAL, CKMB, CKMBINDEX, TROPONINI in the last 168 hours. BNP (last 3 results) No results for input(s): PROBNP in the last 8760 hours. HbA1C: No results for input(s): HGBA1C in the last 72 hours. CBG: No results for input(s): GLUCAP in the last 168 hours. Lipid Profile: No results for input(s): CHOL, HDL, LDLCALC, TRIG, CHOLHDL, LDLDIRECT in the last 72 hours. Thyroid Function Tests: No results for input(s): TSH, T4TOTAL, FREET4, T3FREE, THYROIDAB in the last 72 hours. Anemia Panel: No results for input(s): VITAMINB12, FOLATE, FERRITIN, TIBC, IRON, RETICCTPCT in the last 72 hours. Urine analysis:    Component Value Date/Time   COLORURINE YELLOW 06/19/2016 0854   COLORURINE YELLOW 06/19/2016 0854   APPEARANCEUR CLEAR 06/19/2016 0854   APPEARANCEUR CLEAR 06/19/2016 0854   LABSPEC 1.042 (H) 06/19/2016 0854   LABSPEC 1.042 (H) 06/19/2016 0854   PHURINE 6.5 06/19/2016 0854   PHURINE 6.5 06/19/2016 0854   GLUCOSEU NEGATIVE 06/19/2016 0854   GLUCOSEU NEGATIVE 06/19/2016 0854   HGBUR NEGATIVE 06/19/2016 0854   HGBUR NEGATIVE 06/19/2016 0854   [place] NEGATIVE 06/19/2016 0854   BILIRUBINUR NEGATIVE 06/19/2016 0854   BILIRUBINUR neg 07/08/2015 1136   KETONESUR NEGATIVE 06/19/2016 0854   KETONESUR NEGATIVE 06/19/2016 0854   PROTEINUR NEGATIVE 06/19/2016 0854   PROTEINUR NEGATIVE 06/19/2016 0854   UROBILINOGEN 0.2 07/08/2015 1136   NITRITE NEGATIVE 06/19/2016 0854   NITRITE NEGATIVE 06/19/2016 0854   LEUKOCYTESUR MODERATE (A) 06/19/2016 0854   LEUKOCYTESUR MODERATE (A) 06/19/2016 0854   Sepsis Labs: !!!!!!!!!!!!!!!!!!!!!!!!!!!!!!!!!!!!!!!!!!!! @LABRCNTIP (procalcitonin:4,lacticidven:4) )No  results found for this or any previous visit (from the past 240 hour(s)).   Radiological Exams on Admission: Ct Abdomen Pelvis W Contrast  Result Date: 06/19/2016 CLINICAL DATA:  Abdominal pain with nausea and vomiting for 1 day EXAM: CT ABDOMEN AND PELVIS WITH CONTRAST TECHNIQUE: Multidetector CT imaging of the abdomen and pelvis was performed using the standard protocol following bolus administration of intravenous contrast. Oral contrast was also administered. CONTRAST:  153mL ISOVUE-300 IOPAMIDOL (ISOVUE-300) INJECTION 61% COMPARISON:  Abdomen series June 19, 2016 FINDINGS: Lower chest: There is focal atelectatic change in the posterior right lung base region. Lung bases otherwise are clear. A small amount of gastroesophageal reflux is noted into the distal esophagus. Hepatobiliary: There is hepatic steatosis. No focal liver lesions are evident. There is a gallstone within the gallbladder. The gallbladder wall is not thickened.  There is no biliary duct dilatation. Pancreas: There is no pancreatic mass or inflammatory focus. Spleen: No splenic lesions are evident. Adrenals/Urinary Tract: Adrenals appear normal bilaterally. There is no appreciable renal mass or hydronephrosis on either side. There is no renal or ureteral calculus on either side. Urinary bladder wall is normal in thickness. Urinary bladder is midline. A small focus of air is noted within the urinary bladder anteriorly. A mild degree of urinary bladder prolapse is evident. Stomach/Bowel: There are several loops of proximal to mid jejunum which show mild wall thickening. There is no focal bowel obstruction evident on this study. No free air or portal venous air. Mesentery does not appear thickened. Vascular/Lymphatic: There is atherosclerotic calcification in the aorta and iliac arteries. There is no demonstrable abdominal aortic aneurysm. The major mesenteric vessels appear patent. There is no adenopathy in the abdomen or pelvis.  Reproductive: Uterus appears absent. There is no pelvic mass or pelvic fluid collection. Other: There is no appendiceal region lesion. No abscess is seen in the abdomen or pelvis. A small amount of ascites is noted in the dependent portion of the pelvis. No ascites noted elsewhere. Musculoskeletal: There is degenerative change in the lumbar spine and hip joint regions. There are no blastic or lytic bone lesions. There is no intramuscular or abdominal wall lesion. IMPRESSION: Small focus of air is noted in the urinary bladder. Unless the patient has had recent instrumentation in this area, infectious etiology is felt to be most likely for air within the urinary bladder. Urinary bladder wall is not thickened. No renal or ureteral calculi. No hydronephrosis. There is a small gallstone in the gallbladder. The gallbladder wall does not appear appreciably thickened. There is hepatic steatosis. There is mild wall thickening involving several loops of proximal to mid jejunum without bowel obstruction. Suspect a degree of jejunal enteritis. The surrounding mesenteric does not appear appreciably thickened. Mild ascites in the dependent portion of the pelvis. This finding may be due to the bowel inflammation noted in the left abdomen. Evidence of a degree of gastroesophageal reflux. Extensive aortic atherosclerosis. Evidence of a degree of urinary bladder prolapse. Electronically Signed   By: Lowella Grip III M.D.   On: 06/19/2016 09:22   Dg Abdomen Acute W/chest  Result Date: 06/19/2016 CLINICAL DATA:  Abdominal pain with nausea and vomiting EXAM: DG ABDOMEN ACUTE W/ 1V CHEST COMPARISON:  None. FINDINGS: PA chest: No edema or consolidation. The heart size and pulmonary vascularity are normal. No adenopathy. Supine and upright abdomen: There is no appreciable bowel dilatation. There are occasional air-fluid levels on the left. No free air. There are phleboliths in the pelvis. IMPRESSION: Scattered air-fluid levels  without bowel dilatation. This finding may be indicative of early ileus or enteritis. Obstruction is not felt to be likely. No free air. Lungs clear. Electronically Signed   By: Lowella Grip III M.D.   On: 06/19/2016 08:22    EKG: Independently reviewed. none  Assessment/Plan Active Problems:   Emphysematous cystitis Her lactic acid is less than 2 she is afebrile with a mild leukocytosis, UA showed 6-30 white blood cells with a few bacteria, I agree with IV fluids and IV Rocephin. She is not septic. Urine cultures have been sent or wait for sensitivities. CT scan of the abdomen and pelvis showed mild enteritis unsure of the cause, she denies any diarrhea.  Tobacco abuse: Counseling was done.  DVT prophylaxis: SCD's Code Status: full Family Communication: none Disposition Plan: 8.21.2017 Consults called: none Admission  status: inpatient/med surg   Charlynne Cousins MD [facility] Pager [phone]  If 7PM-7AM, please contact night-coverage [email or web address] Password [facility]  06/19/2016, 12:09 PM

## 2016-06-19 NOTE — ED Notes (Signed)
Patient requested to go to the bathroom to urinate and reported that she is having pressure when she attempted. Patient did not urinate.

## 2016-06-19 NOTE — ED Triage Notes (Signed)
Pt presents with c/o abdominal pain with nausea and vomiting, onset at around 0100 this morning.  Pt reported that she "woke up to severe abdominal pain and started throwing up".

## 2016-06-19 NOTE — Progress Notes (Signed)
Patient was accompanied to the BR to void,no urine output. Bladder scan done, resulted to 99 ml. Fluids encouraged. Will continue to monitor.

## 2016-06-19 NOTE — ED Notes (Addendum)
Patient transported to x-ray. ?

## 2016-06-20 LAB — BASIC METABOLIC PANEL
Anion gap: 8 (ref 5–15)
BUN: 19 mg/dL (ref 6–20)
CO2: 22 mmol/L (ref 22–32)
Calcium: 9 mg/dL (ref 8.9–10.3)
Chloride: 105 mmol/L (ref 101–111)
Creatinine, Ser: 0.69 mg/dL (ref 0.44–1.00)
GFR calc Af Amer: >60 mL/min (ref >60)
GFR calc non Af Amer: >60 mL/min (ref >60)
Glucose, Bld: 177 mg/dL — ABNORMAL HIGH (ref 65–99)
Potassium: 3.9 mmol/L (ref 3.5–5.1)
Sodium: 135 mmol/L (ref 135–145)

## 2016-06-20 LAB — CBC
HCT: 45.1 % (ref 36.0–46.0)
Hemoglobin: 15.2 g/dL — ABNORMAL HIGH (ref 12.0–15.0)
MCH: 28.4 pg (ref 26.0–34.0)
MCHC: 33.7 g/dL (ref 30.0–36.0)
MCV: 84.1 fL (ref 78.0–100.0)
PLATELETS: 341 10*3/uL (ref 150–400)
RBC: 5.36 MIL/uL — AB (ref 3.87–5.11)
RDW: 15.2 % (ref 11.5–15.5)
WBC: 17.2 10*3/uL — ABNORMAL HIGH (ref 4.0–10.5)

## 2016-06-20 LAB — URINE CULTURE

## 2016-06-20 MED ORDER — FAMOTIDINE IN NACL 20-0.9 MG/50ML-% IV SOLN
20.0000 mg | Freq: Two times a day (BID) | INTRAVENOUS | Status: DC
  Administered 2016-06-20 – 2016-07-07 (×35): 20 mg via INTRAVENOUS
  Filled 2016-06-20 (×37): qty 50

## 2016-06-20 MED ORDER — METRONIDAZOLE IN NACL 5-0.79 MG/ML-% IV SOLN
500.0000 mg | Freq: Three times a day (TID) | INTRAVENOUS | Status: DC
  Administered 2016-06-20 – 2016-06-26 (×18): 500 mg via INTRAVENOUS
  Filled 2016-06-20 (×18): qty 100

## 2016-06-20 MED ORDER — NICOTINE 14 MG/24HR TD PT24
14.0000 mg | MEDICATED_PATCH | Freq: Every day | TRANSDERMAL | Status: DC
  Administered 2016-06-20 – 2016-07-07 (×19): 14 mg via TRANSDERMAL
  Filled 2016-06-20 (×19): qty 1

## 2016-06-20 MED ORDER — POLYETHYLENE GLYCOL 3350 17 G PO PACK
17.0000 g | PACK | Freq: Every day | ORAL | Status: DC
  Administered 2016-06-20 – 2016-06-21 (×2): 17 g via ORAL
  Filled 2016-06-20 (×2): qty 1

## 2016-06-20 MED ORDER — SENNOSIDES-DOCUSATE SODIUM 8.6-50 MG PO TABS
1.0000 | ORAL_TABLET | Freq: Two times a day (BID) | ORAL | Status: DC
  Administered 2016-06-20 – 2016-06-21 (×3): 1 via ORAL
  Filled 2016-06-20 (×3): qty 1

## 2016-06-20 MED ORDER — TRIFLUOPERAZINE HCL 5 MG PO TABS
2.5000 mg | ORAL_TABLET | Freq: Every day | ORAL | Status: DC
  Administered 2016-06-20 – 2016-06-27 (×7): 2.5 mg via ORAL
  Filled 2016-06-20 (×9): qty 1

## 2016-06-20 MED ORDER — SODIUM CHLORIDE 0.9 % IV SOLN
INTRAVENOUS | Status: DC
  Administered 2016-06-20: 1000 mL via INTRAVENOUS
  Administered 2016-06-21 – 2016-06-23 (×5): via INTRAVENOUS
  Administered 2016-06-24: 1000 mL via INTRAVENOUS
  Administered 2016-06-25 – 2016-07-01 (×7): via INTRAVENOUS

## 2016-06-20 MED ORDER — ENOXAPARIN SODIUM 40 MG/0.4ML ~~LOC~~ SOLN
40.0000 mg | SUBCUTANEOUS | Status: DC
  Administered 2016-06-20 – 2016-07-06 (×16): 40 mg via SUBCUTANEOUS
  Filled 2016-06-20 (×16): qty 0.4

## 2016-06-20 NOTE — Progress Notes (Signed)
PROGRESS NOTE    Brenda Gibson  [id number] DOB: January 21, 1941 DOA: 06/19/2016 PCP: [name], FNP    Brief Narrative: Brenda Gibson is a 75 y.o. female with medical history significant of no significant past medical history the comes into the hospital for suprapubic pain that started on the day of admission, she related pain started at  1 AM. the pain is constant nothing makes it better or worse, she relates episodes of vomiting, relates no diarrhea no fevers. Denies any sick contacts. She denies any dysuria,bloody emesis.  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   Assessment & Plan:   Active Problems:   Emphysematous cystitis   Elevated blood pressure reading without diagnosis of hypertension   Emphysematous cystitis Lactic acid is less than 2 she is afebrile with a mild leukocytosis, UA showed 6-30 white blood cells with a few bacteria. Continue with  IV fluids and IV Rocephin.  Urine culture; multiples bacteria. Will resend urine culture.  CT scan of the abdomen and pelvis showed mild enteritis. WBC increased to 17, will add flagyl.   Enteritis;  IV flagyl added.  WBC trend.   Leukocytosis; treating for enteritis and UTI.   Tobacco abuse: Counseling was done.   DVT prophylaxis; lovenox Code Status: full code.  Family Communication:care discussed with patient  Disposition Plan: home when infection improved. .   Consultants:   none   Procedures:   none  Antimicrobials:   Ceftriaxone 8-19  Flagyl 8-20   Subjective: Complaining of nausea, no Bowel movement in 2 days.  Suprapubic pain .    Objective: Vitals:   06/19/16 1200 06/19/16 1254 06/19/16 2043 06/20/16 0617  BP: 165/80 (!) 160/62 137/76 (!) 148/73  Pulse: 61  80 83  Resp:  17 18 18   Temp:  98.1 F (36.7 C)  98.4 F (36.9 C) 98.9 F (37.2 C)  TempSrc:  Oral Oral Oral  SpO2: 95% 97% 94% 92%  Weight:      Height:        Intake/Output Summary (Last 24 hours) at 06/20/16 0910 Last data filed at 06/19/16 2114  Gross per 24 hour  Intake           586.67 ml  Output              300 ml  Net           286.67 ml   Filed Weights   06/19/16 0653  Weight: 54.4 kg (120 lb)    Examination:  General exam: Appears calm and comfortable  Respiratory system: Clear to auscultation. Respiratory effort normal. Cardiovascular system: S1 & S2 heard, RRR. No JVD, murmurs, rubs, gallops or clicks. No pedal edema. Gastrointestinal system: Abdomen is nondistended, soft and nontender. No organomegaly or masses felt. Normal bowel sounds heard. Central nervous system: Alert and oriented. No focal neurological deficits. Extremities: Symmetric 5 x 5 power. Skin: No rashes, lesions or ulcers Psychiatry: Judgement and insight appear normal. Mood & affect appropriate.     Data Reviewed: I have personally reviewed following labs and imaging studies  CBC:  Recent Labs Lab 06/19/16 0739 06/20/16 0434  WBC 13.8* 17.2*  NEUTROABS 11.6*  --   HGB 13.1 15.2*  HCT 39.2 45.1  MCV 85.0 84.1  PLT 275 [id number]   Basic Metabolic Panel:  Recent  Labs Lab 06/19/16 0739 06/20/16 0434  NA 138 135  K 3.7 3.9  CL 103 105  CO2 26 22  GLUCOSE 162* 177*  BUN 13 19  CREATININE 0.56 0.69  CALCIUM 9.4 9.0   GFR: Estimated Creatinine Clearance: 53.8 mL/min (by C-G formula based on SCr of 0.8 mg/dL). Liver Function Tests:  Recent Labs Lab 06/19/16 0739  AST 17  ALT 14  ALKPHOS 62  BILITOT 0.6  PROT 6.9  ALBUMIN 3.9    Recent Labs Lab 06/19/16 0739  LIPASE 24   No results for input(s): AMMONIA in the last 168 hours. Coagulation Profile: No results for input(s): INR, PROTIME in the last 168 hours. Cardiac Enzymes: No results for input(s): CKTOTAL, CKMB, CKMBINDEX, TROPONINI in the last 168 hours. BNP  (last 3 results) No results for input(s): PROBNP in the last 8760 hours. HbA1C: No results for input(s): HGBA1C in the last 72 hours. CBG: No results for input(s): GLUCAP in the last 168 hours. Lipid Profile: No results for input(s): CHOL, HDL, LDLCALC, TRIG, CHOLHDL, LDLDIRECT in the last 72 hours. Thyroid Function Tests: No results for input(s): TSH, T4TOTAL, FREET4, T3FREE, THYROIDAB in the last 72 hours. Anemia Panel: No results for input(s): VITAMINB12, FOLATE, FERRITIN, TIBC, IRON, RETICCTPCT in the last 72 hours. Sepsis Labs:  Recent Labs Lab 06/19/16 0756 06/19/16 1042  LATICACIDVEN 1.53 1.16    No results found for this or any previous visit (from the past 240 hour(s)).       Radiology Studies: Ct Abdomen Pelvis W Contrast  Result Date: 06/19/2016 CLINICAL DATA:  Abdominal pain with nausea and vomiting for 1 day EXAM: CT ABDOMEN AND PELVIS WITH CONTRAST TECHNIQUE: Multidetector CT imaging of the abdomen and pelvis was performed using the standard protocol following bolus administration of intravenous contrast. Oral contrast was also administered. CONTRAST:  191mL ISOVUE-300 IOPAMIDOL (ISOVUE-300) INJECTION 61% COMPARISON:  Abdomen series June 19, 2016 FINDINGS: Lower chest: There is focal atelectatic change in the posterior right lung base region. Lung bases otherwise are clear. A small amount of gastroesophageal reflux is noted into the distal esophagus. Hepatobiliary: There is hepatic steatosis. No focal liver lesions are evident. There is a gallstone within the gallbladder. The gallbladder wall is not thickened. There is no biliary duct dilatation. Pancreas: There is no pancreatic mass or inflammatory focus. Spleen: No splenic lesions are evident. Adrenals/Urinary Tract: Adrenals appear normal bilaterally. There is no appreciable renal mass or hydronephrosis on either side. There is no renal or ureteral calculus on either side. Urinary bladder wall is normal in thickness.  Urinary bladder is midline. A small focus of air is noted within the urinary bladder anteriorly. A mild degree of urinary bladder prolapse is evident. Stomach/Bowel: There are several loops of proximal to mid jejunum which show mild wall thickening. There is no focal bowel obstruction evident on this study. No free air or portal venous air. Mesentery does not appear thickened. Vascular/Lymphatic: There is atherosclerotic calcification in the aorta and iliac arteries. There is no demonstrable abdominal aortic aneurysm. The major mesenteric vessels appear patent. There is no adenopathy in the abdomen or pelvis. Reproductive: Uterus appears absent. There is no pelvic mass or pelvic fluid collection. Other: There is no appendiceal region lesion. No abscess is seen in the abdomen or pelvis. A small amount of ascites is noted in the dependent portion of the pelvis. No ascites noted elsewhere. Musculoskeletal: There is degenerative change in the lumbar spine and hip joint regions. There are no blastic  or lytic bone lesions. There is no intramuscular or abdominal wall lesion. IMPRESSION: Small focus of air is noted in the urinary bladder. Unless the patient has had recent instrumentation in this area, infectious etiology is felt to be most likely for air within the urinary bladder. Urinary bladder wall is not thickened. No renal or ureteral calculi. No hydronephrosis. There is a small gallstone in the gallbladder. The gallbladder wall does not appear appreciably thickened. There is hepatic steatosis. There is mild wall thickening involving several loops of proximal to mid jejunum without bowel obstruction. Suspect a degree of jejunal enteritis. The surrounding mesenteric does not appear appreciably thickened. Mild ascites in the dependent portion of the pelvis. This finding may be due to the bowel inflammation noted in the left abdomen. Evidence of a degree of gastroesophageal reflux. Extensive aortic atherosclerosis.  Evidence of a degree of urinary bladder prolapse. Electronically Signed   By: Lowella Grip III M.D.   On: 06/19/2016 09:22   Dg Abdomen Acute W/chest  Result Date: 06/19/2016 CLINICAL DATA:  Abdominal pain with nausea and vomiting EXAM: DG ABDOMEN ACUTE W/ 1V CHEST COMPARISON:  None. FINDINGS: PA chest: No edema or consolidation. The heart size and pulmonary vascularity are normal. No adenopathy. Supine and upright abdomen: There is no appreciable bowel dilatation. There are occasional air-fluid levels on the left. No free air. There are phleboliths in the pelvis. IMPRESSION: Scattered air-fluid levels without bowel dilatation. This finding may be indicative of early ileus or enteritis. Obstruction is not felt to be likely. No free air. Lungs clear. Electronically Signed   By: Lowella Grip III M.D.   On: 06/19/2016 08:22        Scheduled Meds: . cefTRIAXone (ROCEPHIN)  IV  1 g Intravenous Q24H  . nicotine  14 mg Transdermal Daily  . trifluoperazine  5 mg Oral QPM   Continuous Infusions:    LOS: 1 day    Time spent: 35 minutes.     Elmarie Shiley, MD [facility] Pager [phone]  If 7PM-7AM, please contact night-coverage [email or web address] Password [email or web address] 06/20/2016, 9:10 AM

## 2016-06-20 NOTE — Progress Notes (Signed)
MD aware of bladder scan result.

## 2016-06-20 NOTE — Plan of Care (Signed)
Problem: Activity: Goal: Risk for activity intolerance will decrease Outcome: Progressing Patient ambulates to and from the BR with one person assist.

## 2016-06-21 ENCOUNTER — Inpatient Hospital Stay (HOSPITAL_COMMUNITY): Payer: Medicare Other

## 2016-06-21 DIAGNOSIS — K56609 Unspecified intestinal obstruction, unspecified as to partial versus complete obstruction: Secondary | ICD-10-CM | POA: Diagnosis present

## 2016-06-21 DIAGNOSIS — K529 Noninfective gastroenteritis and colitis, unspecified: Secondary | ICD-10-CM | POA: Diagnosis present

## 2016-06-21 LAB — CBC
HCT: 37.8 % (ref 36.0–46.0)
Hemoglobin: 12.3 g/dL (ref 12.0–15.0)
MCH: 27.5 pg (ref 26.0–34.0)
MCHC: 32.5 g/dL (ref 30.0–36.0)
MCV: 84.4 fL (ref 78.0–100.0)
PLATELETS: 285 10*3/uL (ref 150–400)
RBC: 4.48 MIL/uL (ref 3.87–5.11)
RDW: 15.4 % (ref 11.5–15.5)
WBC: 15.8 10*3/uL — AB (ref 4.0–10.5)

## 2016-06-21 LAB — BASIC METABOLIC PANEL
ANION GAP: 7 (ref 5–15)
BUN: 23 mg/dL — AB (ref 6–20)
CALCIUM: 8.7 mg/dL — AB (ref 8.9–10.3)
CO2: 23 mmol/L (ref 22–32)
Chloride: 105 mmol/L (ref 101–111)
Creatinine, Ser: 0.52 mg/dL (ref 0.44–1.00)
GFR calc Af Amer: >60 mL/min (ref >60)
GLUCOSE: 135 mg/dL — AB (ref 65–99)
POTASSIUM: 4 mmol/L (ref 3.5–5.1)
SODIUM: 135 mmol/L (ref 135–145)

## 2016-06-21 NOTE — Evaluation (Signed)
Physical Therapy Evaluation Patient Details Name: Brenda Gibson MRN: [id number] DOB: 05/10/1941 Today's Date: 06/21/2016   History of Present Illness   75 y.o. female with no significant past medical history admitted for suprapubic pain and emphysematous cystitis  Clinical Impression  Pt admitted with above diagnosis. Pt currently with functional limitations due to the deficits listed below (see PT Problem List). Pt will benefit from skilled PT to increase their independence and safety with mobility to allow discharge to the venue listed below.   Pt mobilizing with min/guard assist and supported by IV pole today however states she has SPC at home she can use.  Pt considering HHPT however feels she may return to her baseline prior to d/c.     Follow Up Recommendations Home health PT    Equipment Recommendations  None recommended by PT    Recommendations for Other Services       Precautions / Restrictions Precautions Precautions: Fall      Mobility  Bed Mobility Overal bed mobility: Needs Assistance Bed Mobility: Supine to Sit;Sit to Supine     Supine to sit: Supervision Sit to supine: Supervision      Transfers Overall transfer level: Needs assistance Equipment used: None Transfers: Sit to/from Stand Sit to Stand: Min guard         General transfer comment: min/guard for safety  Ambulation/Gait Ambulation/Gait assistance: Min guard Ambulation Distance (Feet): 180 Feet Assistive device: None (IV pole) Gait Pattern/deviations: Step-through pattern;Decreased stride length     General Gait Details: pt pushed IV pole for support, discussed using her SPC at home upon d/c, very slow pace  Stairs            Wheelchair Mobility    Modified Rankin (Stroke Patients Only)       Balance Overall balance assessment: History of Falls (pt reports previous fall while performing lower body dressing)                                            Pertinent Vitals/Pain Pain Assessment: No/denies pain    Home Living Family/patient expects to be discharged to:: Private residence Living Arrangements: Alone   Type of Home: House Home Access: Stairs to enter   [employer] of Steps: 3 Home Layout: One level Home Equipment: Environmental consultant - 2 wheels;Cane - single point      Prior Function Level of Independence: Independent               Hand Dominance        Extremity/Trunk Assessment               Lower Extremity Assessment: Generalized weakness         Communication   Communication: No difficulties  Cognition Arousal/Alertness: Awake/alert Behavior During Therapy: WFL for tasks assessed/performed Overall Cognitive Status: Within Functional Limits for tasks assessed                      General Comments      Exercises        Assessment/Plan    PT Assessment Patient needs continued PT services  PT Diagnosis Difficulty walking   PT Problem List Decreased strength;Decreased activity tolerance;Decreased balance;Decreased mobility;Decreased knowledge of use of DME  PT Treatment Interventions Gait training;DME instruction;Functional mobility training;Therapeutic activities;Therapeutic exercise;Patient/family education   PT Goals (Current goals can be found in the  Care Plan section) Acute Rehab PT Goals PT Goal Formulation: With patient Time For Goal Achievement: 06/28/16 Potential to Achieve Goals: Good    Frequency Min 3X/week   Barriers to discharge        Co-evaluation               End of Session   Activity Tolerance: Patient tolerated treatment well Patient left: in bed;with call bell/phone within reach;with bed alarm set           Time: 1012-1027 PT Time Calculation (min) (ACUTE ONLY): 15 min   Charges:   PT Evaluation $PT Eval Low Complexity: 1 Procedure     PT G Codes:        Janellie Tennison,KATHrine E 06/21/2016, 12:12 PM Carmelia Bake, PT,  DPT 06/21/2016 Pager: [phone]

## 2016-06-21 NOTE — Progress Notes (Signed)
PROGRESS NOTE    Brenda Gibson  [id number] DOB: 03/31/1941 DOA: 06/19/2016 PCP: [name], FNP    Brief Narrative: Brenda Gibson is a 75 y.o. female with medical history significant of no significant past medical history the comes into the hospital for suprapubic pain that started on the day of admission, she related pain started at  1 AM. the pain is constant nothing makes it better or worse, she relates episodes of vomiting, relates no diarrhea no fevers. Denies any sick contacts. She denies any dysuria,bloody emesis.  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   Assessment & Plan:   Active Problems:   Emphysematous cystitis   Elevated blood pressure reading without diagnosis of hypertension   Emphysematous cystitis Lactic acid is less than 2 she is afebrile with a mild leukocytosis, UA showed 6-30 white blood cells with a few bacteria. Continue with  IV fluids and IV Rocephin.  Urine culture; multiples bacteria. Will resend urine culture.  CT scan of the abdomen and pelvis showed mild enteritis. WBC increased to 17,  Added  flagyl.   Enteritis;  IV flagyl added 8-20 WBC trend.  KUB ; partial SBO, still with abdominal pain , nausea ,No BM   Leukocytosis; treating for enteritis and UTI.   Tobacco abuse: Counseling was done.   DVT prophylaxis; lovenox Code Status: full code.  Family Communication:care discussed with patient  Disposition Plan: home when infection improved. .   Consultants:   none   Procedures:   none  Antimicrobials:   Ceftriaxone 8-19  Flagyl 8-20   Subjective: No BM yet.  Still with nausea. No vomiting.  Feels more distension, still with abdominal pain/    Objective: Vitals:   06/20/16 1430 06/20/16 2050 06/21/16 0600 06/21/16 1238  BP: (!)  150/68 (!) 160/75 (!) 146/69 (!) 169/73  Pulse: 84 87 75 77  Resp: 18 18 18 18   Temp: 97.8 F (36.6 C) 98.2 F (36.8 C) 98.5 F (36.9 C) 97.6 F (36.4 C)  TempSrc: Oral Oral Oral Oral  SpO2: 94% 93% 95% 93%  Weight:      Height:        Intake/Output Summary (Last 24 hours) at 06/21/16 1500 Last data filed at 06/21/16 0835  Gross per 24 hour  Intake             1685 ml  Output              201 ml  Net             1484 ml   Filed Weights   06/19/16 0653  Weight: 54.4 kg (120 lb)    Examination:  General exam: Appears calm and comfortable  Respiratory system: Clear to auscultation. Respiratory effort normal. Cardiovascular system: S1 & S2 heard, RRR. No JVD, murmurs, rubs, gallops or clicks. No pedal edema. Gastrointestinal system: Abdomen is mild tender, no rigidity, No organomegaly or masses felt. Central nervous system: Alert and oriented. No focal neurological deficits. Extremities: Symmetric 5 x 5 power. Skin: No rashes, lesions or ulcers Psychiatry: Judgement and insight appear normal. Mood & affect appropriate.     Data Reviewed: I have personally reviewed following labs and imaging studies  CBC:  Recent Labs Lab 06/19/16 0739 06/20/16 0434 06/21/16 0426  WBC 13.8* 17.2* 15.8*  NEUTROABS 11.6*  --   --  HGB 13.1 15.2* 12.3  HCT 39.2 45.1 37.8  MCV 85.0 84.1 84.4  PLT 275 341 [id number]   Basic Metabolic Panel:  Recent Labs Lab 06/19/16 0739 06/20/16 0434 06/21/16 0426  NA 138 135 135  K 3.7 3.9 4.0  CL 103 105 105  CO2 26 22 23   GLUCOSE 162* 177* 135*  BUN 13 19 23*  CREATININE 0.56 0.69 0.52  CALCIUM 9.4 9.0 8.7*   GFR: Estimated Creatinine Clearance: 53.8 mL/min (by C-G formula based on SCr of 0.8 mg/dL). Liver Function Tests:  Recent Labs Lab 06/19/16 0739  AST 17  ALT 14  ALKPHOS 62  BILITOT 0.6  PROT 6.9  ALBUMIN 3.9    Recent Labs Lab 06/19/16 0739  LIPASE 24   No results for input(s): AMMONIA in the last 168  hours. Coagulation Profile: No results for input(s): INR, PROTIME in the last 168 hours. Cardiac Enzymes: No results for input(s): CKTOTAL, CKMB, CKMBINDEX, TROPONINI in the last 168 hours. BNP (last 3 results) No results for input(s): PROBNP in the last 8760 hours. HbA1C: No results for input(s): HGBA1C in the last 72 hours. CBG: No results for input(s): GLUCAP in the last 168 hours. Lipid Profile: No results for input(s): CHOL, HDL, LDLCALC, TRIG, CHOLHDL, LDLDIRECT in the last 72 hours. Thyroid Function Tests: No results for input(s): TSH, T4TOTAL, FREET4, T3FREE, THYROIDAB in the last 72 hours. Anemia Panel: No results for input(s): VITAMINB12, FOLATE, FERRITIN, TIBC, IRON, RETICCTPCT in the last 72 hours. Sepsis Labs:  Recent Labs Lab 06/19/16 0756 06/19/16 1042  LATICACIDVEN 1.53 1.16    Recent Results (from the past 240 hour(s))  Urine culture     Status: Abnormal   Collection Time: 06/19/16  8:54 AM  Result Value Ref Range Status   Specimen Description URINE, RANDOM  Final   Special Requests NONE  Final   Culture MULTIPLE SPECIES PRESENT, SUGGEST RECOLLECTION (A)  Final   Report Status 06/20/2016 FINAL  Final         Radiology Studies: Dg Abd 1 View  Result Date: 06/21/2016 CLINICAL DATA:  General abdominal pain and distension EXAM: ABDOMEN - 1 VIEW COMPARISON:  06/19/2016 FINDINGS: Scattered large and small bowel gas is noted. Multiple dilated loops of small bowel are noted consistent with a small-bowel obstruction. This has increased in the interval from the prior exam. IMPRESSION: Increased small bowel dilatation consistent with at least partial small bowel obstruction. Electronically Signed   By: Inez Catalina M.D.   On: 06/21/2016 14:31        Scheduled Meds: . cefTRIAXone (ROCEPHIN)  IV  1 g Intravenous Q24H  . enoxaparin (LOVENOX) injection  40 mg Subcutaneous Q24H  . famotidine (PEPCID) IV  20 mg Intravenous Q12H  . metronidazole  500 mg  Intravenous Q8H  . nicotine  14 mg Transdermal Daily  . trifluoperazine  2.5 mg Oral q1800   Continuous Infusions: . sodium chloride 75 mL/hr at 06/21/16 0311     LOS: 2 days    Time spent: 35 minutes.     Elmarie Shiley, MD [facility] Pager [phone]  If 7PM-7AM, please contact night-coverage [email or web address] Password [email or web address] 06/21/2016, 3:00 PM

## 2016-06-21 NOTE — Consult Note (Signed)
Reason for Consult:possible SBO Referring Physician: Dr. Rudolpho Sevin  Brenda Gibson is an 75 y.o. female.  HPI: Pt presented to the ED on 06/19/16, with abdominal pain, nausea and vomiting. No one else was sick at home. She complained fo suprapubic pain .  Work up showed emphysematous cystitis.  She was admitted by Medicine for this.  She was started on Rocephin, then added Flagyl IV.  Urine culture grew multiple species.   She remains afebrile, and VSS. Despite antibiotics she continued to have some abdominal discomfort along with nausea and vomiting. CT on 8/19 showed some loops of small bowel distension, and some possible jejunal enteritis.  Repeat film shows increased SBO with what is possible SBO. Labs are still elevated , BMP is stable.  We are ask to see for SBO with possible enteritis.     Past Medical History:  Diagnosis Date  . Basal cell carcinoma of ala nasi   . Cataract   . Knee pain, bilateral   . Measles   . Myalgia   . Seborrheic dermatitis of scalp   . Varicella     Past Surgical History:  Procedure Laterality Date  . BASAL CELL CARCINOMA EXCISION     '11 Allyson Sabal)  . CATARACT EXTRACTION W/ INTRAOCULAR LENS IMPLANT     right eye. '09 (Dr. Dolores Lory)    Family History  Problem Relation Age of Onset  . Alcohol abuse Mother   . Cancer Father 31    stomach cancer  . Prostate cancer Father   . Hypertension Maternal Grandfather   . Diabetes Neg Hx   . Heart disease Neg Hx     Social History:  reports that she has been smoking Cigarettes.  She has a 31.20 pack-year smoking history. She has never used smokeless tobacco. She reports that she drinks alcohol. She reports that she does not use drugs. Tobacco: ongoing since age16  <1PPD ETOH: Brandy, a bottle every 6 weeks Drugs:  None Lives alone  Allergies: No Known Allergies  Medications:  Prior to Admission:  Prescriptions Prior to Admission  Medication Sig Dispense Refill Last Dose  . trifluoperazine (STELAZINE)  5 MG tablet Take 1 tablet by mouth every evening.  7 06/18/2016 at Unknown time   Scheduled: . cefTRIAXone (ROCEPHIN)  IV  1 g Intravenous Q24H  . enoxaparin (LOVENOX) injection  40 mg Subcutaneous Q24H  . famotidine (PEPCID) IV  20 mg Intravenous Q12H  . metronidazole  500 mg Intravenous Q8H  . nicotine  14 mg Transdermal Daily  . trifluoperazine  2.5 mg Oral q1800   Continuous: . sodium chloride 75 mL/hr at 06/21/16 0311   ZOX:[id number] **OR** ondansetron (ZOFRAN) IV, traMADol Anti-infectives    Start     Dose/Rate Route Frequency Ordered Stop   06/20/16 1300  metroNIDAZOLE (FLAGYL) IVPB 500 mg     500 mg 100 mL/hr over 60 Minutes Intravenous Every 8 hours 06/20/16 1203     06/20/16 1000  cefTRIAXone (ROCEPHIN) 1 g in dextrose 5 % 50 mL IVPB     1 g 100 mL/hr over 30 Minutes Intravenous Every 24 hours 06/19/16 1254     06/19/16 1100  cefTRIAXone (ROCEPHIN) 1 g in dextrose 5 % 50 mL IVPB     1 g 100 mL/hr over 30 Minutes Intravenous  Once 06/19/16 1052 06/19/16 1129      Results for orders placed or performed during the hospital encounter of 06/19/16 (from the past 48 hour(s))  CBC  Status: Abnormal   Collection Time: 06/20/16  4:34 AM  Result Value Ref Range   WBC 17.2 (H) 4.0 - 10.5 K/uL   RBC 5.36 (H) 3.87 - 5.11 MIL/uL   Hemoglobin 15.2 (H) 12.0 - 15.0 g/dL   HCT 45.1 36.0 - 46.0 %   MCV 84.1 78.0 - 100.0 fL   MCH 28.4 26.0 - 34.0 pg   MCHC 33.7 30.0 - 36.0 g/dL   RDW 15.2 11.5 - 15.5 %   Platelets 341 150 - 400 K/uL  Basic metabolic panel     Status: Abnormal   Collection Time: 06/20/16  4:34 AM  Result Value Ref Range   Sodium 135 135 - 145 mmol/L   Potassium 3.9 3.5 - 5.1 mmol/L   Chloride 105 101 - 111 mmol/L   CO2 22 22 - 32 mmol/L   Glucose, Bld 177 (H) 65 - 99 mg/dL   BUN 19 6 - 20 mg/dL   Creatinine, Ser 0.69 0.44 - 1.00 mg/dL   Calcium 9.0 8.9 - 10.3 mg/dL   GFR calc non Af Amer >60 >60 mL/min   GFR calc Af Amer >60 >60 mL/min    Comment:  (NOTE) The eGFR has been calculated using the CKD EPI equation. This calculation has not been validated in all clinical situations. eGFR's persistently <60 mL/min signify possible Chronic Kidney Disease.    Anion gap 8 5 - 15  CBC     Status: Abnormal   Collection Time: 06/21/16  4:26 AM  Result Value Ref Range   WBC 15.8 (H) 4.0 - 10.5 K/uL   RBC 4.48 3.87 - 5.11 MIL/uL   Hemoglobin 12.3 12.0 - 15.0 g/dL   HCT 37.8 36.0 - 46.0 %   MCV 84.4 78.0 - 100.0 fL   MCH 27.5 26.0 - 34.0 pg   MCHC 32.5 30.0 - 36.0 g/dL   RDW 15.4 11.5 - 15.5 %   Platelets 285 150 - 400 K/uL  Basic metabolic panel     Status: Abnormal   Collection Time: 06/21/16  4:26 AM  Result Value Ref Range   Sodium 135 135 - 145 mmol/L   Potassium 4.0 3.5 - 5.1 mmol/L   Chloride 105 101 - 111 mmol/L   CO2 23 22 - 32 mmol/L   Glucose, Bld 135 (H) 65 - 99 mg/dL   BUN 23 (H) 6 - 20 mg/dL   Creatinine, Ser 0.52 0.44 - 1.00 mg/dL   Calcium 8.7 (L) 8.9 - 10.3 mg/dL   GFR calc non Af Amer >60 >60 mL/min   GFR calc Af Amer >60 >60 mL/min    Comment: (NOTE) The eGFR has been calculated using the CKD EPI equation. This calculation has not been validated in all clinical situations. eGFR's persistently <60 mL/min signify possible Chronic Kidney Disease.    Anion gap 7 5 - 15    Dg Abd 1 View  Result Date: 06/21/2016 CLINICAL DATA:  General abdominal pain and distension EXAM: ABDOMEN - 1 VIEW COMPARISON:  06/19/2016 FINDINGS: Scattered large and small bowel gas is noted. Multiple dilated loops of small bowel are noted consistent with a small-bowel obstruction. This has increased in the interval from the prior exam. IMPRESSION: Increased small bowel dilatation consistent with at least partial small bowel obstruction. Electronically Signed   By: Inez Catalina M.D.   On: 06/21/2016 14:31    Review of Systems  Constitutional: Positive for weight loss (0 pounds over the last 2 years). Negative for chills, diaphoresis,  fever  and malaise/fatigue.  HENT: Negative.   Eyes: Negative.   Respiratory: Negative.        She does snore-lives alone  Cardiovascular: Negative.   Gastrointestinal: Positive for abdominal pain, constipation (She thinks she is constipated and wants to know why we just don't give her an enema no BM for 3 days. She is normally not constipated.), nausea and vomiting. Negative for blood in stool, diarrhea, heartburn and melena.  Genitourinary: Negative.   Musculoskeletal: Negative.   Skin: Negative.   Neurological: Negative.  Negative for weakness.  Endo/Heme/Allergies: Negative.   Psychiatric/Behavioral: Negative.    Blood pressure (!) 169/73, pulse 77, temperature 97.6 F (36.4 C), temperature source Oral, resp. rate 18, height 5' 5.5" (1.664 m), weight 54.4 kg (120 lb), SpO2 93 %. Physical Exam  Constitutional: She is oriented to person, place, and time. She appears well-developed and well-nourished. No distress.  HENT:  Head: Normocephalic and atraumatic.  Nose: Nose normal.  Eyes: Right eye exhibits no discharge. Left eye exhibits no discharge. No scleral icterus.  Neck: Normal range of motion. Neck supple. No JVD present. No tracheal deviation present. No thyromegaly present.  Cardiovascular: Normal rate, regular rhythm, normal heart sounds and intact distal pulses.   No murmur heard. Respiratory: Effort normal and breath sounds normal. No respiratory distress. She has no wheezes. She has no rales. She exhibits no tenderness.  GI: Soft. She exhibits distension. She exhibits no mass. There is no tenderness. There is no rebound and no guarding.  Hypoactive bowel sounds present  Musculoskeletal: She exhibits no edema, tenderness or deformity.  Lymphadenopathy:    She has no cervical adenopathy.  Neurological: She is alert and oriented to person, place, and time. No cranial nerve deficit.  Skin: Skin is warm and dry. No rash noted. She is not diaphoretic. No erythema. No pallor.   Psychiatric: She has a normal mood and affect. Her behavior is normal. Judgment and thought content normal.    Assessment/Plan: Small bowel obstruction with possible jejunal enteritis Emphysematous cystitis Weight loss Ongoing tobacco use  Plan:  Bowel rest, continue antibiotics, day 3 Rocephin, day 2 Flagyl.  NG if she has further vomiting.  Recheck labs and film in AM.   Yoav Okane 06/21/2016, 3:06 PM

## 2016-06-22 ENCOUNTER — Inpatient Hospital Stay (HOSPITAL_COMMUNITY): Payer: Medicare Other

## 2016-06-22 DIAGNOSIS — K529 Noninfective gastroenteritis and colitis, unspecified: Secondary | ICD-10-CM

## 2016-06-22 LAB — BASIC METABOLIC PANEL
Anion gap: 7 (ref 5–15)
BUN: 16 mg/dL (ref 6–20)
CHLORIDE: 105 mmol/L (ref 101–111)
CO2: 23 mmol/L (ref 22–32)
Calcium: 8.8 mg/dL — ABNORMAL LOW (ref 8.9–10.3)
Creatinine, Ser: 0.5 mg/dL (ref 0.44–1.00)
GFR calc non Af Amer: >60 mL/min (ref >60)
Glucose, Bld: 138 mg/dL — ABNORMAL HIGH (ref 65–99)
POTASSIUM: 3.6 mmol/L (ref 3.5–5.1)
SODIUM: 135 mmol/L (ref 135–145)

## 2016-06-22 LAB — CBC
HCT: 40.8 % (ref 36.0–46.0)
HEMOGLOBIN: 13.3 g/dL (ref 12.0–15.0)
MCH: 27.9 pg (ref 26.0–34.0)
MCHC: 32.6 g/dL (ref 30.0–36.0)
MCV: 85.5 fL (ref 78.0–100.0)
Platelets: 330 10*3/uL (ref 150–400)
RBC: 4.77 MIL/uL (ref 3.87–5.11)
RDW: 15.5 % (ref 11.5–15.5)
WBC: 13.6 10*3/uL — ABNORMAL HIGH (ref 4.0–10.5)

## 2016-06-22 LAB — URINE CULTURE: Culture: NO GROWTH

## 2016-06-22 MED ORDER — MORPHINE SULFATE (PF) 2 MG/ML IV SOLN
1.0000 mg | INTRAVENOUS | Status: DC | PRN
  Administered 2016-06-22 – 2016-06-29 (×6): 1 mg via INTRAVENOUS
  Filled 2016-06-22 (×6): qty 1

## 2016-06-22 MED ORDER — DIATRIZOATE MEGLUMINE & SODIUM 66-10 % PO SOLN
90.0000 mL | Freq: Once | ORAL | Status: DC
Start: 2016-06-22 — End: 2016-06-22

## 2016-06-22 MED ORDER — HYDRALAZINE HCL 20 MG/ML IJ SOLN
20.0000 mg | Freq: Four times a day (QID) | INTRAMUSCULAR | Status: DC | PRN
  Administered 2016-06-22 – 2016-06-25 (×3): 20 mg via INTRAVENOUS
  Filled 2016-06-22 (×3): qty 1

## 2016-06-22 MED ORDER — DIATRIZOATE MEGLUMINE & SODIUM 66-10 % PO SOLN
90.0000 mL | Freq: Once | ORAL | Status: AC
  Administered 2016-06-22: 90 mL via ORAL
  Filled 2016-06-22: qty 90

## 2016-06-22 MED ORDER — HYDRALAZINE HCL 20 MG/ML IJ SOLN
10.0000 mg | Freq: Four times a day (QID) | INTRAMUSCULAR | Status: DC | PRN
  Administered 2016-06-22: 10 mg via INTRAVENOUS
  Filled 2016-06-22: qty 1

## 2016-06-22 MED ORDER — BISACODYL 10 MG RE SUPP
10.0000 mg | Freq: Once | RECTAL | Status: AC
  Administered 2016-06-22: 10 mg via RECTAL
  Filled 2016-06-22: qty 1

## 2016-06-22 NOTE — Progress Notes (Signed)
PROGRESS NOTE    [name]  [id number] DOB: [birth year] DOA: 06/19/2016 PCP: [name], FNP    Brief Narrative: Brenda Gibson is a 75 y.o. female with medical history significant of no significant past medical history the comes into the hospital for suprapubic pain that started on the day of admission, she related pain started at  1 AM. the pain is constant nothing makes it better or worse, she relates episodes of vomiting, relates no diarrhea no fevers. Denies any sick contacts. She denies any dysuria,bloody emesis.  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  Admitted with UTI, enteritis, subsequently develops ileus vs partial SBO. Surgery following.   Assessment & Plan:   Active Problems:   Emphysematous cystitis   Elevated blood pressure reading without diagnosis of hypertension   SBO (small bowel obstruction) (HCC)   Enteritis   Emphysematous cystitis Lactic acid is less than 2 she is afebrile with a mild leukocytosis, UA showed 6-30 white blood cells with a few bacteria. Continue with  IV fluids and IV Rocephin.  Urine culture; multiples bacteria. Repeated urine culture 8-20 no growth.  CT scan of the abdomen and pelvis showed mild enteritis. WBC increased to 17,  Added  flagyl.   Enteritis; Ileus vs SBO IV flagyl added 8-20 WBC trend.  KUB ; partial SBO, still with abdominal pain , nausea ,No BM  Surgery consulted. SBO protocol today.  NPO.   Leukocytosis; treating for enteritis and UTI. Trending down.   Tobacco abuse: Counseling was done.  HTN; Will order PRN hydralazine.   DVT prophylaxis; lovenox Code Status: full code.  Family Communication:care discussed with patient  Disposition Plan: home when infection improved. .   Consultants:   none   Procedures:    none  Antimicrobials:   Ceftriaxone 8-19  Flagyl 8-20   Subjective: No BM yet. Still with abdominal distension.   Objective: Vitals:   06/21/16 1238 06/22/16 0120 06/22/16 0548 06/22/16 1257  BP: (!) 169/73 (!) 143/90 (!) 187/90 (!) 192/99  Pulse: 77 89 87 87  Resp: 18 18 18 14   Temp: 97.6 F (36.4 C) 97.5 F (36.4 C) 98 F (36.7 C) 98.5 F (36.9 C)  TempSrc: Oral Oral Oral Oral  SpO2: 93% 94% 94% 98%  Weight:      Height:        Intake/Output Summary (Last 24 hours) at 06/22/16 1408 Last data filed at 06/22/16 0549  Gross per 24 hour  Intake          1123.75 ml  Output              600 ml  Net           523.75 ml   Filed Weights   06/19/16 0653  Weight: 54.4 kg (120 lb)    Examination:  General exam: Appears calm and comfortable  Respiratory system: Clear to auscultation. Respiratory effort normal. Cardiovascular system: S1 & S2 heard, RRR. No JVD, murmurs, rubs, gallops or clicks. No pedal edema. Gastrointestinal system: Abdomen is mild tender, no rigidity, No organomegaly or masses felt. Distended.  Central nervous system: Alert and oriented. No focal neurological deficits. Extremities: Symmetric 5 x 5 power. Skin: No rashes, lesions or ulcers Psychiatry: Judgement and insight appear normal. Mood & affect appropriate.     Data Reviewed:  I have personally reviewed following labs and imaging studies  CBC:  Recent Labs Lab 06/19/16 0739 06/20/16 0434 06/21/16 0426 06/22/16 0345  WBC 13.8* 17.2* 15.8* 13.6*  NEUTROABS 11.6*  --   --   --   HGB 13.1 15.2* 12.3 13.3  HCT 39.2 45.1 37.8 40.8  MCV 85.0 84.1 84.4 85.5  PLT 275 341 285 [id number]   Basic Metabolic Panel:  Recent Labs Lab 06/19/16 0739 06/20/16 0434 06/21/16 0426 06/22/16 0345  NA 138 135 135 135  K 3.7 3.9 4.0 3.6  CL 103 105 105 105  CO2 26 22 23 23   GLUCOSE 162* 177* 135* 138*  BUN 13 19 23* 16  CREATININE 0.56 0.69 0.52 0.50  CALCIUM 9.4 9.0 8.7* 8.8*   GFR: Estimated  Creatinine Clearance: 53.8 mL/min (by C-G formula based on SCr of 0.8 mg/dL). Liver Function Tests:  Recent Labs Lab 06/19/16 0739  AST 17  ALT 14  ALKPHOS 62  BILITOT 0.6  PROT 6.9  ALBUMIN 3.9    Recent Labs Lab 06/19/16 0739  LIPASE 24   No results for input(s): AMMONIA in the last 168 hours. Coagulation Profile: No results for input(s): INR, PROTIME in the last 168 hours. Cardiac Enzymes: No results for input(s): CKTOTAL, CKMB, CKMBINDEX, TROPONINI in the last 168 hours. BNP (last 3 results) No results for input(s): PROBNP in the last 8760 hours. HbA1C: No results for input(s): HGBA1C in the last 72 hours. CBG: No results for input(s): GLUCAP in the last 168 hours. Lipid Profile: No results for input(s): CHOL, HDL, LDLCALC, TRIG, CHOLHDL, LDLDIRECT in the last 72 hours. Thyroid Function Tests: No results for input(s): TSH, T4TOTAL, FREET4, T3FREE, THYROIDAB in the last 72 hours. Anemia Panel: No results for input(s): VITAMINB12, FOLATE, FERRITIN, TIBC, IRON, RETICCTPCT in the last 72 hours. Sepsis Labs:  Recent Labs Lab 06/19/16 0756 06/19/16 1042  LATICACIDVEN 1.53 1.16    Recent Results (from the past 240 hour(s))  Urine culture     Status: Abnormal   Collection Time: 06/19/16  8:54 AM  Result Value Ref Range Status   Specimen Description URINE, RANDOM  Final   Special Requests NONE  Final   Culture MULTIPLE SPECIES PRESENT, SUGGEST RECOLLECTION (A)  Final   Report Status 06/20/2016 FINAL  Final  Urine culture     Status: None   Collection Time: 06/20/16  7:42 AM  Result Value Ref Range Status   Specimen Description URINE, CLEAN CATCH  Final   Special Requests NONE  Final   Culture NO GROWTH Performed at [facility]   Final   Report Status 06/22/2016 FINAL  Final         Radiology Studies: Dg Abd 1 View  Result Date: 06/21/2016 CLINICAL DATA:  General abdominal pain and distension EXAM: ABDOMEN - 1 VIEW COMPARISON:  06/19/2016  FINDINGS: Scattered large and small bowel gas is noted. Multiple dilated loops of small bowel are noted consistent with a small-bowel obstruction. This has increased in the interval from the prior exam. IMPRESSION: Increased small bowel dilatation consistent with at least partial small bowel obstruction. Electronically Signed   By: Inez Catalina M.D.   On: 06/21/2016 14:31        Scheduled Meds: . cefTRIAXone (ROCEPHIN)  IV  1 g Intravenous Q24H  . enoxaparin (LOVENOX) injection  40 mg Subcutaneous Q24H  . famotidine (PEPCID) IV  20 mg Intravenous Q12H  . metronidazole  500 mg Intravenous Q8H  . nicotine  14  mg Transdermal Daily  . trifluoperazine  2.5 mg Oral q1800   Continuous Infusions: . sodium chloride 75 mL/hr at 06/22/16 1157     LOS: 3 days    Time spent: 35 minutes.     Elmarie Shiley, MD [facility] Pager [phone]  If 7PM-7AM, please contact night-coverage [email or web address] Password [email or web address] 06/22/2016, 2:08 PM

## 2016-06-22 NOTE — Care Management Note (Signed)
Case Management Note  Patient Details  Name: Brenda Gibson MRN: [id number] Date of Birth: 01-19-1941  Subjective/Objective:       75 yo admitted with Emphysematous cystitis             Action/Plan: From home alone. PT recommendations were for HHPT. This CM met with pt at bedside to discuss [facility] services. Pt states she might be interested in [facility]. [facility] provider list left with pt to look over. CM will check back in with pt for decision.  Expected Discharge Date:                  Expected Discharge Plan:  [place]  In-House Referral:     Discharge planning Services  CM Consult  Post Acute Care Choice:  Home Health Choice offered to:  Patient  DME Arranged:    DME Agency:     HH Arranged:  PT HH Agency:     Status of Service:  In process, will continue to follow  If discussed at Long Length of Stay Meetings, dates discussed:    Additional CommentsLynnell Catalan, RN 06/22/2016, 1:01 PM [phone]

## 2016-06-22 NOTE — Progress Notes (Signed)
  Subjective: She still reports bloating and nausea Not passing flatus  Objective: Vital signs in last 24 hours: Temp:  [97.5 F (36.4 C)-98 F (36.7 C)] 98 F (36.7 C) (08/22 0548) Pulse Rate:  [77-89] 87 (08/22 0548) Resp:  [18] 18 (08/22 0548) BP: (143-187)/(73-90) 187/90 (08/22 0548) SpO2:  [93 %-94 %] 94 % (08/22 0548) Last BM Date: 06/18/16  Intake/Output from previous day: 08/21 0701 - 08/22 0700 In: 1753.8 [P.O.:480; I.V.:973.8; IV Piggyback:300] Out: 800 [Urine:800] Intake/Output this shift: No intake/output data recorded.  Exam: Abdomen distended, mildly tender diffusely   Lab Results:   Recent Labs  06/21/16 0426 06/22/16 0345  WBC 15.8* 13.6*  HGB 12.3 13.3  HCT 37.8 40.8  PLT 285 330   BMET  Recent Labs  06/21/16 0426 06/22/16 0345  NA 135 135  K 4.0 3.6  CL 105 105  CO2 23 23  GLUCOSE 135* 138*  BUN 23* 16  CREATININE 0.52 0.50  CALCIUM 8.7* 8.8*   PT/INR No results for input(s): LABPROT, INR in the last 72 hours. ABG No results for input(s): PHART, HCO3 in the last 72 hours.  Invalid input(s): PCO2, PO2  Studies/Results: Dg Abd 1 View  Result Date: 06/21/2016 CLINICAL DATA:  General abdominal pain and distension EXAM: ABDOMEN - 1 VIEW COMPARISON:  06/19/2016 FINDINGS: Scattered large and small bowel gas is noted. Multiple dilated loops of small bowel are noted consistent with a small-bowel obstruction. This has increased in the interval from the prior exam. IMPRESSION: Increased small bowel dilatation consistent with at least partial small bowel obstruction. Electronically Signed   By: Inez Catalina M.D.   On: 06/21/2016 14:31    Anti-infectives: Anti-infectives    Start     Dose/Rate Route Frequency Ordered Stop   06/20/16 1300  metroNIDAZOLE (FLAGYL) IVPB 500 mg     500 mg 100 mL/hr over 60 Minutes Intravenous Every 8 hours 06/20/16 1203     06/20/16 1000  cefTRIAXone (ROCEPHIN) 1 g in dextrose 5 % 50 mL IVPB     1 g 100  mL/hr over 30 Minutes Intravenous Every 24 hours 06/19/16 1254     06/19/16 1100  cefTRIAXone (ROCEPHIN) 1 g in dextrose 5 % 50 mL IVPB     1 g 100 mL/hr over 30 Minutes Intravenous  Once 06/19/16 1052 06/19/16 1129      Assessment/Plan: s/p * No surgery found *  Suspect ileus from enteritis/cystitis.  Will order SBO protocol xray Give suppository   LOS: 3 days    [place] A 06/22/2016

## 2016-06-23 LAB — BASIC METABOLIC PANEL
Anion gap: 8 (ref 5–15)
BUN: 19 mg/dL (ref 6–20)
CALCIUM: 8.9 mg/dL (ref 8.9–10.3)
CO2: 22 mmol/L (ref 22–32)
CREATININE: 0.36 mg/dL — AB (ref 0.44–1.00)
Chloride: 108 mmol/L (ref 101–111)
GFR calc non Af Amer: >60 mL/min (ref >60)
Glucose, Bld: 119 mg/dL — ABNORMAL HIGH (ref 65–99)
Potassium: 3.1 mmol/L — ABNORMAL LOW (ref 3.5–5.1)
SODIUM: 138 mmol/L (ref 135–145)

## 2016-06-23 LAB — CBC
HEMATOCRIT: 38.3 % (ref 36.0–46.0)
Hemoglobin: 12.8 g/dL (ref 12.0–15.0)
MCH: 27.9 pg (ref 26.0–34.0)
MCHC: 33.4 g/dL (ref 30.0–36.0)
MCV: 83.4 fL (ref 78.0–100.0)
Platelets: 300 10*3/uL (ref 150–400)
RBC: 4.59 MIL/uL (ref 3.87–5.11)
RDW: 15.4 % (ref 11.5–15.5)
WBC: 11.7 10*3/uL — ABNORMAL HIGH (ref 4.0–10.5)

## 2016-06-23 MED ORDER — POTASSIUM CHLORIDE 10 MEQ/100ML IV SOLN
10.0000 meq | INTRAVENOUS | Status: AC
  Administered 2016-06-23 (×3): 10 meq via INTRAVENOUS
  Filled 2016-06-23 (×3): qty 100

## 2016-06-23 MED ORDER — SODIUM CHLORIDE 0.9 % IV BOLUS (SEPSIS)
500.0000 mL | Freq: Once | INTRAVENOUS | Status: AC
  Administered 2016-06-23: 500 mL via INTRAVENOUS

## 2016-06-23 NOTE — Progress Notes (Signed)
[name] reports that she slept poorly last night and has yet to pass flatus or stool despite suppository yesterday. She vomited and had an NG tube has been placed overnight.   Vitals:   06/22/16 2258 06/23/16 0520  BP: 124/66 (!) 152/73  Pulse: (!) 108 93  Resp: 20 16  Temp: 97.4 F (36.3 C) 98.4 F (36.9 C)    In 2.6L Out 861mL urine, 2.8L bilious NG output since placement (400 this morning)  CBC Latest Ref Rng & Units 06/23/2016 06/22/2016 06/21/2016  WBC 4.0 - 10.5 K/uL 11.7(H) 13.6(H) 15.8(H)  Hemoglobin 12.0 - 15.0 g/dL 12.8 13.3 12.3  Hematocrit 36.0 - 46.0 % 38.3 40.8 37.8  Platelets 150 - 400 K/uL 300 330 285   BMP Latest Ref Rng & Units 06/23/2016 06/22/2016 06/21/2016  Glucose 65 - 99 mg/dL 119(H) 138(H) 135(H)  BUN 6 - 20 mg/dL 19 16 23(H)  Creatinine 0.44 - 1.00 mg/dL 0.36(L) 0.50 0.52  Sodium 135 - 145 mmol/L 138 135 135  Potassium 3.5 - 5.1 mmol/L 3.1(L) 3.6 4.0  Chloride 101 - 111 mmol/L 108 105 105  CO2 22 - 32 mmol/L 22 23 23   Calcium 8.9 - 10.3 mg/dL 8.9 8.8(L) 8.7(L)   Aox3, no distress Unlabored respirations Ngt in place with bilious output Abdomen distended, mildly diffusely tender, no palpable hernia/mass Extremities warm  Imaging:  CLINICAL DATA:  Encounter for nasogastric tube.  EXAM: PORTABLE ABDOMEN - 1 VIEW  COMPARISON:  Earlier today  FINDINGS: Nasogastric tube tip and side port are in the fundus of the stomach. No interval progression of oral contrast which is still in the stomach and proximal small bowel. Unchanged gaseous distention of bowel. No gross pneumoperitoneum in the supine position. Elevated left diaphragm.  IMPRESSION: 1. Stable positioning of nasogastric tube with tip in the gastric fundus. 2. Small bowel obstruction with no progression of oral contrast since earlier today. Small bowel obstruction has progressed since admission CT 06/19/2016.  Electronically Signed   By: Monte Fantasia M.D.   On: 06/22/2016  22:14   A/P: Small bowel obstruction. Clinically unchanged, labs have improved with some fluid resuscitation however plain films worsening (NG placed only about 2 hours before most recent film).   Continue NG decompression, fluid resuscitation (consider changing IVF to D5 1/2NS or LR for hyperchloremia), scheduled abx. If no bowel function or improvement in NG output in next 24-48h she may require exploration in the OR.

## 2016-06-23 NOTE — Progress Notes (Signed)
PT Cancellation Note  Patient Details Name: BONNIEJEAN SUMMERTON MRN: [id number] DOB: May 03, 1941   Cancelled Treatment:    Reason Eval/Treat Not Completed: Medical issues which prohibited therapy;Fatigue/lethargy limiting ability to participate (resting, had NG tube placed. Check back tomorrow.)   Claretha Cooper 06/23/2016, 3:56 PM Tresa Endo PT [phone]

## 2016-06-23 NOTE — Progress Notes (Signed)
PROGRESS NOTE    Brenda Gibson  [id number] DOB: 08/09/1941 DOA: 06/19/2016 PCP: [name], FNP    Brief Narrative: Brenda Gibson is a 75 y.o. female with medical history significant of no significant past medical history the comes into the hospital for abdominal pain that started on the day of admission, she related pain started at  1 AM. CBC was done that shows mild leukocytosis-  CT scan was obtained that shows emphysematous cystitis and thickening of Jujunal wall and mild ascites - UA showed a few bacteria with 3-6 white blood cells. She subsequently develops ileus vs partial SBO and NG tube was placed.    Subjective: Abdomen distended but no pain. No BM.   Assessment & Plan:   Emphysematous cystitis Lactic acid is less than 2 she is afebrile with a mild leukocytosis, UA showed 6-30 white blood cells with a few bacteria. Continue with  IV fluids and IV Rocephin x 7 days- stop date 7/25  Urine culture; multiples bacteria. Repeated urine culture 8-20 no growth.  CT scan of the abdomen and pelvis showed mild enteritis. WBC increased to 17,  Added  flagyl.   Enteritis; Ileus vs SBO - currently on Rocephin and Flagyl Surgery consulted - NG tube - SBO protocol reveals contrast is still in stomack and proximal small bowel  Hypokalemia - repleted  Leukocytosis - treating for enteritis and UTI. Trending down.   Tobacco abuse: Counseling was done - Nicotine patch  HTN - PRN hydralazine.   DVT prophylaxis; lovenox Code Status: full code.  Family Communication:care discussed with patient  Disposition Plan: home when stable   Consultants:   none   Procedures:   none  Antimicrobials:   Ceftriaxone 8-19  Flagyl 8-20     Objective: Vitals:   06/22/16 1906 06/22/16 2127 06/22/16 2258 06/23/16 0520  BP: (!) 156/75  124/66 (!) 152/73  Pulse: (!) 117  (!) 108 93  Resp:   20 16  Temp:   97.4 F (36.3 C) 98.4 F (36.9 C)  TempSrc:   Oral Oral    SpO2: 94% 90% 91% 92%  Weight:      Height:        Intake/Output Summary (Last 24 hours) at 06/23/16 1429 Last data filed at 06/23/16 1033  Gross per 24 hour  Intake          2621.25 ml  Output             2000 ml  Net           621.25 ml   Filed Weights   06/19/16 0653  Weight: 54.4 kg (120 lb)    Examination:  General exam: Appears calm and comfortable  Respiratory system: Clear to auscultation. Respiratory effort normal. Cardiovascular system: S1 & S2 heard, RRR. No JVD, murmurs, rubs, gallops or clicks. No pedal edema. Gastrointestinal system: Abdomen is mild tender, and distended, no rigidity- no bowel sounds Central nervous system: Alert and oriented. No focal neurological deficits. Extremities: Symmetric 5 x 5 power. Skin: No rashes, lesions or ulcers Psychiatry: Judgement and insight appear normal. Mood & affect appropriate.     Data Reviewed: I have personally reviewed following labs and imaging studies  CBC:  Recent Labs Lab 06/19/16 0739 06/20/16 0434 06/21/16 0426 06/22/16 0345 06/23/16 0404  WBC 13.8* 17.2* 15.8* 13.6* 11.7*  NEUTROABS 11.6*  --   --   --   --   HGB 13.1 15.2* 12.3 13.3 12.8  HCT 39.2 45.1  37.8 40.8 38.3  MCV 85.0 84.1 84.4 85.5 83.4  PLT 275 341 285 330 [id number]   Basic Metabolic Panel:  Recent Labs Lab 06/19/16 0739 06/20/16 0434 06/21/16 0426 06/22/16 0345 06/23/16 0404  NA 138 135 135 135 138  K 3.7 3.9 4.0 3.6 3.1*  CL 103 105 105 105 108  CO2 26 22 23 23 22   GLUCOSE 162* 177* 135* 138* 119*  BUN 13 19 23* 16 19  CREATININE 0.56 0.69 0.52 0.50 0.36*  CALCIUM 9.4 9.0 8.7* 8.8* 8.9   GFR: Estimated Creatinine Clearance: 53.8 mL/min (by C-G formula based on SCr of 0.8 mg/dL). Liver Function Tests:  Recent Labs Lab 06/19/16 0739  AST 17  ALT 14  ALKPHOS 62  BILITOT 0.6  PROT 6.9  ALBUMIN 3.9    Recent Labs Lab 06/19/16 0739  LIPASE 24   No results for input(s): AMMONIA in the last 168 hours. Coagulation  Profile: No results for input(s): INR, PROTIME in the last 168 hours. Cardiac Enzymes: No results for input(s): CKTOTAL, CKMB, CKMBINDEX, TROPONINI in the last 168 hours. BNP (last 3 results) No results for input(s): PROBNP in the last 8760 hours. HbA1C: No results for input(s): HGBA1C in the last 72 hours. CBG: No results for input(s): GLUCAP in the last 168 hours. Lipid Profile: No results for input(s): CHOL, HDL, LDLCALC, TRIG, CHOLHDL, LDLDIRECT in the last 72 hours. Thyroid Function Tests: No results for input(s): TSH, T4TOTAL, FREET4, T3FREE, THYROIDAB in the last 72 hours. Anemia Panel: No results for input(s): VITAMINB12, FOLATE, FERRITIN, TIBC, IRON, RETICCTPCT in the last 72 hours. Sepsis Labs:  Recent Labs Lab 06/19/16 0756 06/19/16 1042  LATICACIDVEN 1.53 1.16    Recent Results (from the past 240 hour(s))  Urine culture     Status: Abnormal   Collection Time: 06/19/16  8:54 AM  Result Value Ref Range Status   Specimen Description URINE, RANDOM  Final   Special Requests NONE  Final   Culture MULTIPLE SPECIES PRESENT, SUGGEST RECOLLECTION (A)  Final   Report Status 06/20/2016 FINAL  Final  Urine culture     Status: None   Collection Time: 06/20/16  7:42 AM  Result Value Ref Range Status   Specimen Description URINE, CLEAN CATCH  Final   Special Requests NONE  Final   Culture NO GROWTH Performed at [facility]   Final   Report Status 06/22/2016 FINAL  Final         Radiology Studies: Dg Abd 1 View  Result Date: 06/22/2016 CLINICAL DATA:  Nasogastric tube placement. EXAM: ABDOMEN - 1 VIEW COMPARISON:  Radiograph of June 22, 2016. FINDINGS: Nasogastric tube tip is seen in proximal stomach. There is continued and stable presence of dilated small bowel loops concerning for distal small bowel obstruction. No abnormal calcifications are noted. IMPRESSION: Stable small bowel dilatation concerning for distal small bowel obstruction. Nasogastric tube tip  seen in proximal stomach. Electronically Signed   By: Marijo Conception, M.D.   On: 06/22/2016 20:40   Dg Abd Portable 1v  Result Date: 06/22/2016 CLINICAL DATA:  Encounter for nasogastric tube. EXAM: PORTABLE ABDOMEN - 1 VIEW COMPARISON:  Earlier today FINDINGS: Nasogastric tube tip and side port are in the fundus of the stomach. No interval progression of oral contrast which is still in the stomach and proximal small bowel. Unchanged gaseous distention of bowel. No gross pneumoperitoneum in the supine position. Elevated left diaphragm. IMPRESSION: 1. Stable positioning of nasogastric tube with tip in the  gastric fundus. 2. Small bowel obstruction with no progression of oral contrast since earlier today. Small bowel obstruction has progressed since admission CT 06/19/2016. Electronically Signed   By: Monte Fantasia M.D.   On: 06/22/2016 22:14   Dg Abd Portable 1v-small Bowel Obstruction Protocol-initial, 8 Hr Delay  Result Date: 06/22/2016 CLINICAL DATA:  Small bowel obstruction.  8 hour follow-up. EXAM: PORTABLE ABDOMEN - 1 VIEW COMPARISON:  06/21/2016 FINDINGS: There is an ongoing small bowel obstruction with similar degree of small bowel dilation, maximum diameter of 4.6 cm. No free intra-abdominal gas or intestinal pneumatosis on this limited radiograph. IMPRESSION: Ongoing small bowel obstruction. Electronically Signed   By: Fidela Salisbury M.D.   On: 06/22/2016 18:38        Scheduled Meds: . cefTRIAXone (ROCEPHIN)  IV  1 g Intravenous Q24H  . enoxaparin (LOVENOX) injection  40 mg Subcutaneous Q24H  . famotidine (PEPCID) IV  20 mg Intravenous Q12H  . metronidazole  500 mg Intravenous Q8H  . nicotine  14 mg Transdermal Daily  . trifluoperazine  2.5 mg Oral q1800   Continuous Infusions: . sodium chloride 75 mL/hr at 06/23/16 0606     LOS: 4 days    Time spent: 35 minutes.     Debbe Odea, MD [facility] Pager [phone]  If 7PM-7AM, please contact  night-coverage [email or web address] Password [facility] 06/23/2016, 2:29 PM

## 2016-06-23 NOTE — Progress Notes (Addendum)
At change of shift yesterday, patient reported extreme abdominal discomfort.  Patient's abdomen was distended and larger than previously assessed per day shift report.  Order was obtained to insert an NG tube.  X ray was performed afterwards to check placement.  Results showed NG was only in the proximal area.  NG tube was advanced.  Second Xray was performed & NG was in the appropriate position.  Patient hooked to LIWS.  There was 1000 cc's total of NG output emptied overnight.  Patient felt much more comfortable after NG placed. Patient this am had urine output of only 100 cc's. Bladder scan only showed 39 ml in the bladder (post-void).  MD notified and Bolus given.  Day shift RN to monitor voiding today.Azzie Glatter [place]

## 2016-06-24 ENCOUNTER — Inpatient Hospital Stay (HOSPITAL_COMMUNITY): Payer: Medicare Other

## 2016-06-24 LAB — BASIC METABOLIC PANEL
Anion gap: 9 (ref 5–15)
BUN: 21 mg/dL — AB (ref 6–20)
CO2: 23 mmol/L (ref 22–32)
CREATININE: 0.55 mg/dL (ref 0.44–1.00)
Calcium: 8.7 mg/dL — ABNORMAL LOW (ref 8.9–10.3)
Chloride: 109 mmol/L (ref 101–111)
GFR calc Af Amer: >60 mL/min (ref >60)
GFR calc non Af Amer: >60 mL/min (ref >60)
Glucose, Bld: 105 mg/dL — ABNORMAL HIGH (ref 65–99)
Potassium: 3.2 mmol/L — ABNORMAL LOW (ref 3.5–5.1)
SODIUM: 141 mmol/L (ref 135–145)

## 2016-06-24 LAB — CBC
HCT: 40.8 % (ref 36.0–46.0)
Hemoglobin: 13.5 g/dL (ref 12.0–15.0)
MCH: 28.2 pg (ref 26.0–34.0)
MCHC: 33.1 g/dL (ref 30.0–36.0)
MCV: 85.2 fL (ref 78.0–100.0)
PLATELETS: 319 10*3/uL (ref 150–400)
RBC: 4.79 MIL/uL (ref 3.87–5.11)
RDW: 15.9 % — AB (ref 11.5–15.5)
WBC: 7.7 10*3/uL (ref 4.0–10.5)

## 2016-06-24 LAB — MAGNESIUM: Magnesium: 2.1 mg/dL (ref 1.7–2.4)

## 2016-06-24 MED ORDER — POTASSIUM CHLORIDE 10 MEQ/100ML IV SOLN
10.0000 meq | INTRAVENOUS | Status: AC
  Administered 2016-06-24 (×3): 10 meq via INTRAVENOUS
  Filled 2016-06-24 (×4): qty 100

## 2016-06-24 MED ORDER — POTASSIUM CHLORIDE 10 MEQ/100ML IV SOLN
10.0000 meq | Freq: Once | INTRAVENOUS | Status: AC
  Administered 2016-06-24: 10 meq via INTRAVENOUS
  Filled 2016-06-24: qty 100

## 2016-06-24 MED ORDER — METOCLOPRAMIDE HCL 5 MG/ML IJ SOLN
10.0000 mg | Freq: Three times a day (TID) | INTRAMUSCULAR | Status: AC
  Administered 2016-06-24 – 2016-06-25 (×3): 10 mg via INTRAVENOUS
  Filled 2016-06-24 (×3): qty 2

## 2016-06-24 MED ORDER — BISACODYL 10 MG RE SUPP
10.0000 mg | Freq: Once | RECTAL | Status: DC
  Filled 2016-06-24 (×2): qty 1

## 2016-06-24 MED ORDER — SODIUM CHLORIDE 0.9 % IV BOLUS (SEPSIS)
500.0000 mL | Freq: Once | INTRAVENOUS | Status: AC
  Administered 2016-06-24: 500 mL via INTRAVENOUS

## 2016-06-24 NOTE — Progress Notes (Signed)
Subjective: Still not passing flatus Denies abdominal pain   Objective: Vital signs in last 24 hours: Temp:  [97.9 F (36.6 C)-98.7 F (37.1 C)] 98.4 F (36.9 C) (08/24 1055) Pulse Rate:  [80-88] 81 (08/24 1055) Resp:  [16-20] 18 (08/24 1051) BP: (160-184)/(77-90) 184/77 (08/24 1055) SpO2:  [93 %-94 %] 94 % (08/24 1055) Weight:  [62.8 kg (138 lb 7.2 oz)] 62.8 kg (138 lb 7.2 oz) (08/24 0652) Last BM Date: 06/18/16  Intake/Output from previous day: 08/23 0701 - 08/24 0700 In: 1141 [I.V.:1141] Out: 3600 [Urine:1100; Emesis/NG output:2500] Intake/Output this shift: Total I/O In: -  Out: 1350 [Urine:400; Emesis/NG output:950]  Exam: Abdomen distended but not tender  Lab Results:   Recent Labs  06/23/16 0404 06/24/16 0416  WBC 11.7* 7.7  HGB 12.8 13.5  HCT 38.3 40.8  PLT 300 319   BMET  Recent Labs  06/23/16 0404 06/24/16 0416  NA 138 141  K 3.1* 3.2*  CL 108 109  CO2 22 23  GLUCOSE 119* 105*  BUN 19 21*  CREATININE 0.36* 0.55  CALCIUM 8.9 8.7*   PT/INR No results for input(s): LABPROT, INR in the last 72 hours. ABG No results for input(s): PHART, HCO3 in the last 72 hours.  Invalid input(s): PCO2, PO2  Studies/Results: Dg Abd 1 View  Result Date: 06/24/2016 CLINICAL DATA:  Small bowel obstruction EXAM: ABDOMEN - 1 VIEW COMPARISON:  Supine abdominal radiograph June 22, 2016 FINDINGS: There remain loops of moderately distended gas-filled small bowel in the upper and mid abdomen. There is no significant colonic gas. There is some stool in the right colon. There is no rectal gas. There are no free extraluminal gas collections. The esophagogastric tube tip in proximal port project in the gastric body. IMPRESSION: Persistent mid to distal small bowel obstruction. No evidence of perforation. Electronically Signed   By: David  Martinique M.D.   On: 06/24/2016 09:09   Dg Abd 1 View  Result Date: 06/22/2016 CLINICAL DATA:  Nasogastric tube placement. EXAM:  ABDOMEN - 1 VIEW COMPARISON:  Radiograph of June 22, 2016. FINDINGS: Nasogastric tube tip is seen in proximal stomach. There is continued and stable presence of dilated small bowel loops concerning for distal small bowel obstruction. No abnormal calcifications are noted. IMPRESSION: Stable small bowel dilatation concerning for distal small bowel obstruction. Nasogastric tube tip seen in proximal stomach. Electronically Signed   By: Marijo Conception, M.D.   On: 06/22/2016 20:40   Dg Abd Portable 1v  Result Date: 06/22/2016 CLINICAL DATA:  Encounter for nasogastric tube. EXAM: PORTABLE ABDOMEN - 1 VIEW COMPARISON:  Earlier today FINDINGS: Nasogastric tube tip and side port are in the fundus of the stomach. No interval progression of oral contrast which is still in the stomach and proximal small bowel. Unchanged gaseous distention of bowel. No gross pneumoperitoneum in the supine position. Elevated left diaphragm. IMPRESSION: 1. Stable positioning of nasogastric tube with tip in the gastric fundus. 2. Small bowel obstruction with no progression of oral contrast since earlier today. Small bowel obstruction has progressed since admission CT 06/19/2016. Electronically Signed   By: Monte Fantasia M.D.   On: 06/22/2016 22:14   Dg Abd Portable 1v-small Bowel Obstruction Protocol-initial, 8 Hr Delay  Result Date: 06/22/2016 CLINICAL DATA:  Small bowel obstruction.  8 hour follow-up. EXAM: PORTABLE ABDOMEN - 1 VIEW COMPARISON:  06/21/2016 FINDINGS: There is an ongoing small bowel obstruction with similar degree of small bowel dilation, maximum diameter of 4.6 cm. No free intra-abdominal  gas or intestinal pneumatosis on this limited radiograph. IMPRESSION: Ongoing small bowel obstruction. Electronically Signed   By: Fidela Salisbury M.D.   On: 06/22/2016 18:38    Anti-infectives: Anti-infectives    Start     Dose/Rate Route Frequency Ordered Stop   06/20/16 1300  metroNIDAZOLE (FLAGYL) IVPB 500 mg     500  mg 100 mL/hr over 60 Minutes Intravenous Every 8 hours 06/20/16 1203     06/20/16 1000  cefTRIAXone (ROCEPHIN) 1 g in dextrose 5 % 50 mL IVPB     1 g 100 mL/hr over 30 Minutes Intravenous Every 24 hours 06/19/16 1254     06/19/16 1100  cefTRIAXone (ROCEPHIN) 1 g in dextrose 5 % 50 mL IVPB     1 g 100 mL/hr over 30 Minutes Intravenous  Once 06/19/16 1052 06/19/16 1129      Assessment/Plan:  SBO vs ileus with no history of prior abdominal surgery  Will continue NPO and NG.  Will give suppos from below Repeat films in the morning.  If no better, then exp lap is recommended tomorrow  LOS: 5 days    Harper Smoker A 06/24/2016

## 2016-06-24 NOTE — Progress Notes (Signed)
PROGRESS NOTE    Brenda Gibson  [id number] DOB: 09-Jan-1941 DOA: 06/19/2016 PCP: [name], FNP    Brief Narrative: Brenda Gibson is a 75 y.o. female with medical history significant of no significant past medical history the comes into the hospital for abdominal pain that started on the day of admission. CBC was done that shows mild leukocytosis-  CT scan was obtained that shows emphysematous cystitis and thickening of Jujunal wall and mild ascites - UA showed a few bacteria with 3-6 white blood cells. She subsequently develops ileus vs partial SBO and NG tube was placed.    Subjective: Abdomen continues to be distended. Still not passing flatus.   Assessment & Plan:   Enteritis; Ileus vs SBO - currently on Rocephin and Flagyl with marked improvement in WBC count - Surgery consulted - NG tube - SBO protocol reveals contrast is still in stomach and with obstruction in mid to distal small bowel   Emphysematous cystitis Lactic acid is less than 2- she is afebrile with a mild leukocytosis, UA showed 6-30 white blood cells with a few bacteria. Continue with  IV fluids and IV Rocephin x 7 days- stop date 7/25  Urine culture; multiples bacteria. Repeated urine culture 8-20 no growth.   Hypokalemia - repleted  Leukocytosis - treating for enteritis and UTI. Trending down.   Tobacco abuse: Counseling was done - Nicotine patch  HTN - PRN hydralazine.   DVT prophylaxis; lovenox Code Status: full code.  Family Communication:care discussed with patient  Disposition Plan: home when stable   Consultants:   gen surgery    Procedures:   none  Antimicrobials:   Ceftriaxone 8-19  Flagyl 8-20     Objective: Vitals:   06/24/16 0652 06/24/16 1051 06/24/16 1055 06/24/16 1409  BP: (!) 160/86  (!) 184/77 (!) 184/90  Pulse: 85 80 81 81  Resp: 20 18    Temp: 97.9 F (36.6 C)  98.4 F (36.9 C) 98.2 F (36.8 C)  TempSrc: Oral  Oral Oral  SpO2: 93%  94% 95%   Weight: 62.8 kg (138 lb 7.2 oz)     Height:        Intake/Output Summary (Last 24 hours) at 06/24/16 1411 Last data filed at 06/24/16 1050  Gross per 24 hour  Intake             1141 ml  Output             2900 ml  Net            -1759 ml   Filed Weights   06/19/16 0653 06/24/16 0652  Weight: 54.4 kg (120 lb) 62.8 kg (138 lb 7.2 oz)    Examination:  General exam: Appears calm and comfortable  Respiratory system: Clear to auscultation. Respiratory effort normal. Cardiovascular system: S1 & S2 heard, RRR. No JVD, murmurs, rubs, gallops or clicks. No pedal edema. Gastrointestinal system: Abdomen is mild tender, and distended, no rigidity- no bowel sounds Central nervous system: Alert and oriented. No focal neurological deficits. Extremities: Symmetric 5 x 5 power. Skin: No rashes, lesions or ulcers Psychiatry: Judgement and insight appear normal. Mood & affect appropriate.     Data Reviewed: I have personally reviewed following labs and imaging studies  CBC:  Recent Labs Lab 06/19/16 0739 06/20/16 0434 06/21/16 0426 06/22/16 0345 06/23/16 0404 06/24/16 0416  WBC 13.8* 17.2* 15.8* 13.6* 11.7* 7.7  NEUTROABS 11.6*  --   --   --   --   --  HGB 13.1 15.2* 12.3 13.3 12.8 13.5  HCT 39.2 45.1 37.8 40.8 38.3 40.8  MCV 85.0 84.1 84.4 85.5 83.4 85.2  PLT 275 341 285 330 300 [id number]   Basic Metabolic Panel:  Recent Labs Lab 06/20/16 0434 06/21/16 0426 06/22/16 0345 06/23/16 0404 06/24/16 0416  NA 135 135 135 138 141  K 3.9 4.0 3.6 3.1* 3.2*  CL 105 105 105 108 109  CO2 22 23 23 22 23   GLUCOSE 177* 135* 138* 119* 105*  BUN 19 23* 16 19 21*  CREATININE 0.69 0.52 0.50 0.36* 0.55  CALCIUM 9.0 8.7* 8.8* 8.9 8.7*  MG  --   --   --   --  2.1   GFR: Estimated Creatinine Clearance: 57.5 mL/min (by C-G formula based on SCr of 0.8 mg/dL). Liver Function Tests:  Recent Labs Lab 06/19/16 0739  AST 17  ALT 14  ALKPHOS 62  BILITOT 0.6  PROT 6.9  ALBUMIN 3.9     Recent Labs Lab 06/19/16 0739  LIPASE 24   No results for input(s): AMMONIA in the last 168 hours. Coagulation Profile: No results for input(s): INR, PROTIME in the last 168 hours. Cardiac Enzymes: No results for input(s): CKTOTAL, CKMB, CKMBINDEX, TROPONINI in the last 168 hours. BNP (last 3 results) No results for input(s): PROBNP in the last 8760 hours. HbA1C: No results for input(s): HGBA1C in the last 72 hours. CBG: No results for input(s): GLUCAP in the last 168 hours. Lipid Profile: No results for input(s): CHOL, HDL, LDLCALC, TRIG, CHOLHDL, LDLDIRECT in the last 72 hours. Thyroid Function Tests: No results for input(s): TSH, T4TOTAL, FREET4, T3FREE, THYROIDAB in the last 72 hours. Anemia Panel: No results for input(s): VITAMINB12, FOLATE, FERRITIN, TIBC, IRON, RETICCTPCT in the last 72 hours. Sepsis Labs:  Recent Labs Lab 06/19/16 0756 06/19/16 1042  LATICACIDVEN 1.53 1.16    Recent Results (from the past 240 hour(s))  Urine culture     Status: Abnormal   Collection Time: 06/19/16  8:54 AM  Result Value Ref Range Status   Specimen Description URINE, RANDOM  Final   Special Requests NONE  Final   Culture MULTIPLE SPECIES PRESENT, SUGGEST RECOLLECTION (A)  Final   Report Status 06/20/2016 FINAL  Final  Urine culture     Status: None   Collection Time: 06/20/16  7:42 AM  Result Value Ref Range Status   Specimen Description URINE, CLEAN CATCH  Final   Special Requests NONE  Final   Culture NO GROWTH Performed at [facility]   Final   Report Status 06/22/2016 FINAL  Final         Radiology Studies: Dg Abd 1 View  Result Date: 06/24/2016 CLINICAL DATA:  Small bowel obstruction EXAM: ABDOMEN - 1 VIEW COMPARISON:  Supine abdominal radiograph June 22, 2016 FINDINGS: There remain loops of moderately distended gas-filled small bowel in the upper and mid abdomen. There is no significant colonic gas. There is some stool in the right colon. There is  no rectal gas. There are no free extraluminal gas collections. The esophagogastric tube tip in proximal port project in the gastric body. IMPRESSION: Persistent mid to distal small bowel obstruction. No evidence of perforation. Electronically Signed   By: David  Martinique M.D.   On: 06/24/2016 09:09   Dg Abd 1 View  Result Date: 06/22/2016 CLINICAL DATA:  Nasogastric tube placement. EXAM: ABDOMEN - 1 VIEW COMPARISON:  Radiograph of June 22, 2016. FINDINGS: Nasogastric tube tip is seen in proximal stomach.  There is continued and stable presence of dilated small bowel loops concerning for distal small bowel obstruction. No abnormal calcifications are noted. IMPRESSION: Stable small bowel dilatation concerning for distal small bowel obstruction. Nasogastric tube tip seen in proximal stomach. Electronically Signed   By: Marijo Conception, M.D.   On: 06/22/2016 20:40   Dg Abd Portable 1v  Result Date: 06/22/2016 CLINICAL DATA:  Encounter for nasogastric tube. EXAM: PORTABLE ABDOMEN - 1 VIEW COMPARISON:  Earlier today FINDINGS: Nasogastric tube tip and side port are in the fundus of the stomach. No interval progression of oral contrast which is still in the stomach and proximal small bowel. Unchanged gaseous distention of bowel. No gross pneumoperitoneum in the supine position. Elevated left diaphragm. IMPRESSION: 1. Stable positioning of nasogastric tube with tip in the gastric fundus. 2. Small bowel obstruction with no progression of oral contrast since earlier today. Small bowel obstruction has progressed since admission CT 06/19/2016. Electronically Signed   By: Monte Fantasia M.D.   On: 06/22/2016 22:14   Dg Abd Portable 1v-small Bowel Obstruction Protocol-initial, 8 Hr Delay  Result Date: 06/22/2016 CLINICAL DATA:  Small bowel obstruction.  8 hour follow-up. EXAM: PORTABLE ABDOMEN - 1 VIEW COMPARISON:  06/21/2016 FINDINGS: There is an ongoing small bowel obstruction with similar degree of small bowel  dilation, maximum diameter of 4.6 cm. No free intra-abdominal gas or intestinal pneumatosis on this limited radiograph. IMPRESSION: Ongoing small bowel obstruction. Electronically Signed   By: Fidela Salisbury M.D.   On: 06/22/2016 18:38        Scheduled Meds: . bisacodyl  10 mg Rectal Once  . cefTRIAXone (ROCEPHIN)  IV  1 g Intravenous Q24H  . enoxaparin (LOVENOX) injection  40 mg Subcutaneous Q24H  . famotidine (PEPCID) IV  20 mg Intravenous Q12H  . metoCLOPramide (REGLAN) injection  10 mg Intravenous Q8H  . metronidazole  500 mg Intravenous Q8H  . nicotine  14 mg Transdermal Daily  . sodium chloride  500 mL Intravenous Once  . trifluoperazine  2.5 mg Oral q1800   Continuous Infusions: . sodium chloride 125 mL/hr at 06/24/16 0938     LOS: 5 days    Time spent: 35 minutes.     Debbe Odea, MD [facility] Pager [phone]  If 7PM-7AM, please contact night-coverage [email or web address] Password [email or web address] 06/24/2016, 2:11 PM

## 2016-06-24 NOTE — Progress Notes (Signed)
Patient had bowel movement of medium amount, refused suppository.

## 2016-06-24 NOTE — Progress Notes (Signed)
PT Cancellation Note  Patient Details Name: Brenda Gibson MRN: [id number] DOB: 06-24-1941   Cancelled Treatment:    Reason Eval/Treat Not Completed: Other (comment);Fatigue/lethargy limiting ability to participate (patient reports that she has ambulated today. Not ready to go again. will check back tomorrow.)   Claretha Cooper 06/24/2016, 1:44 PM Tresa Endo PT [phone]

## 2016-06-25 ENCOUNTER — Encounter (HOSPITAL_COMMUNITY): Payer: Self-pay | Admitting: Anesthesiology

## 2016-06-25 ENCOUNTER — Inpatient Hospital Stay (HOSPITAL_COMMUNITY): Payer: Medicare Other | Admitting: Anesthesiology

## 2016-06-25 ENCOUNTER — Inpatient Hospital Stay (HOSPITAL_COMMUNITY): Payer: Medicare Other

## 2016-06-25 ENCOUNTER — Encounter (HOSPITAL_COMMUNITY)
Admission: EM | Disposition: A | Discharge status: Home Health | Payer: Self-pay | Source: Home / Self Care | Attending: Internal Medicine

## 2016-06-25 HISTORY — PX: LAPAROTOMY: SHX154

## 2016-06-25 HISTORY — PX: LYSIS OF ADHESION: SHX5961

## 2016-06-25 LAB — BASIC METABOLIC PANEL
Anion gap: 11 (ref 5–15)
BUN: 21 mg/dL — AB (ref 6–20)
CALCIUM: 8.3 mg/dL — AB (ref 8.9–10.3)
CO2: 20 mmol/L — AB (ref 22–32)
CREATININE: 0.5 mg/dL (ref 0.44–1.00)
Chloride: 111 mmol/L (ref 101–111)
GFR calc non Af Amer: >60 mL/min (ref >60)
Glucose, Bld: 95 mg/dL (ref 65–99)
Potassium: 3.2 mmol/L — ABNORMAL LOW (ref 3.5–5.1)
SODIUM: 142 mmol/L (ref 135–145)

## 2016-06-25 LAB — SURGICAL PCR SCREEN
MRSA, PCR: NEGATIVE
STAPHYLOCOCCUS AUREUS: NEGATIVE

## 2016-06-25 SURGERY — LAPAROTOMY, EXPLORATORY
Anesthesia: General

## 2016-06-25 MED ORDER — DEXAMETHASONE SODIUM PHOSPHATE 10 MG/ML IJ SOLN
INTRAMUSCULAR | Status: AC
  Filled 2016-06-25: qty 2

## 2016-06-25 MED ORDER — ONDANSETRON HCL 4 MG/2ML IJ SOLN: 4.0000 mg | Freq: Four times a day (QID) | INTRAMUSCULAR | Status: DC | PRN

## 2016-06-25 MED ORDER — CEFAZOLIN SODIUM-DEXTROSE 2-4 GM/100ML-% IV SOLN
2.0000 g | Freq: Once | INTRAVENOUS | Status: AC
  Administered 2016-06-25: 2 g via INTRAVENOUS
  Filled 2016-06-25: qty 100

## 2016-06-25 MED ORDER — ONDANSETRON HCL 4 MG/2ML IJ SOLN: 4.0000 mg | Freq: Once | INTRAMUSCULAR | Status: DC | PRN

## 2016-06-25 MED ORDER — FENTANYL CITRATE (PF) 100 MCG/2ML IJ SOLN
INTRAMUSCULAR | Status: AC
  Filled 2016-06-25: qty 4

## 2016-06-25 MED ORDER — CETYLPYRIDINIUM CHLORIDE 0.05 % MT LIQD
7.0000 mL | Freq: Two times a day (BID) | OROMUCOSAL | Status: DC
  Administered 2016-06-25 – 2016-06-26 (×2): 7 mL via OROMUCOSAL

## 2016-06-25 MED ORDER — SUGAMMADEX SODIUM 200 MG/2ML IV SOLN
INTRAVENOUS | Status: AC
  Filled 2016-06-25: qty 2

## 2016-06-25 MED ORDER — DIPHENHYDRAMINE HCL 12.5 MG/5ML PO ELIX
12.5000 mg | ORAL_SOLUTION | Freq: Four times a day (QID) | ORAL | Status: DC | PRN
  Filled 2016-06-25: qty 5

## 2016-06-25 MED ORDER — MORPHINE SULFATE 2 MG/ML IV SOLN
INTRAVENOUS | Status: DC
  Administered 2016-06-25: 3 mg via INTRAVENOUS
  Administered 2016-06-25: 15:00:00 via INTRAVENOUS
  Administered 2016-06-25: 15 mg via INTRAVENOUS
  Administered 2016-06-26: 3 mg via INTRAVENOUS
  Administered 2016-06-26 (×2): 2 mg via INTRAVENOUS
  Administered 2016-06-26: 15 mg via INTRAVENOUS
  Administered 2016-06-26: 5 mg via INTRAVENOUS
  Administered 2016-06-26: 2 mg via INTRAVENOUS
  Administered 2016-06-27: 0 mg via INTRAVENOUS
  Administered 2016-06-27: 3 mg via INTRAVENOUS
  Administered 2016-06-27: 6 mg via INTRAVENOUS
  Administered 2016-06-27: 7 mg via INTRAVENOUS
  Administered 2016-06-27: 3 mg via INTRAVENOUS
  Administered 2016-06-28: 4 mg via INTRAVENOUS
  Administered 2016-06-28: 3 mg via INTRAVENOUS
  Administered 2016-06-28: 7 mg via INTRAVENOUS
  Administered 2016-06-28: 3 mg via INTRAVENOUS
  Administered 2016-06-28: 8 mg via INTRAVENOUS
  Administered 2016-06-29: 3 mg via INTRAVENOUS
  Administered 2016-06-29: 1 mg via INTRAVENOUS
  Administered 2016-06-29: 07:00:00 via INTRAVENOUS
  Administered 2016-06-29: 3 mg via INTRAVENOUS
  Filled 2016-06-25 (×2): qty 25

## 2016-06-25 MED ORDER — LACTATED RINGERS IV SOLN
INTRAVENOUS | Status: DC
  Administered 2016-06-25 (×3): via INTRAVENOUS

## 2016-06-25 MED ORDER — POTASSIUM CHLORIDE 10 MEQ/100ML IV SOLN
10.0000 meq | INTRAVENOUS | Status: AC
  Administered 2016-06-25 (×3): 10 meq via INTRAVENOUS
  Filled 2016-06-25 (×4): qty 100

## 2016-06-25 MED ORDER — SODIUM CHLORIDE 0.9% FLUSH: 9.0000 mL | INTRAVENOUS | Status: DC | PRN

## 2016-06-25 MED ORDER — HYDRALAZINE HCL 20 MG/ML IJ SOLN
10.0000 mg | Freq: Four times a day (QID) | INTRAMUSCULAR | Status: DC | PRN
  Administered 2016-06-29: 10 mg via INTRAVENOUS
  Filled 2016-06-25: qty 1

## 2016-06-25 MED ORDER — PROPOFOL 10 MG/ML IV BOLUS
INTRAVENOUS | Status: DC | PRN
  Administered 2016-06-25: 100 mg via INTRAVENOUS

## 2016-06-25 MED ORDER — FENTANYL CITRATE (PF) 100 MCG/2ML IJ SOLN
INTRAMUSCULAR | Status: DC | PRN
  Administered 2016-06-25 (×4): 50 ug via INTRAVENOUS

## 2016-06-25 MED ORDER — ONDANSETRON HCL 4 MG/2ML IJ SOLN
INTRAMUSCULAR | Status: AC
  Filled 2016-06-25: qty 2

## 2016-06-25 MED ORDER — ROCURONIUM BROMIDE 100 MG/10ML IV SOLN
INTRAVENOUS | Status: AC
  Filled 2016-06-25: qty 1

## 2016-06-25 MED ORDER — NALOXONE HCL 0.4 MG/ML IJ SOLN: 0.4000 mg | INTRAMUSCULAR | Status: DC | PRN

## 2016-06-25 MED ORDER — DEXAMETHASONE SODIUM PHOSPHATE 10 MG/ML IJ SOLN
INTRAMUSCULAR | Status: DC | PRN
  Administered 2016-06-25: 10 mg via INTRAVENOUS

## 2016-06-25 MED ORDER — SUCCINYLCHOLINE CHLORIDE 200 MG/10ML IV SOSY
PREFILLED_SYRINGE | INTRAVENOUS | Status: DC | PRN
  Administered 2016-06-25: 100 mg via INTRAVENOUS

## 2016-06-25 MED ORDER — ROCURONIUM BROMIDE 100 MG/10ML IV SOLN
INTRAVENOUS | Status: DC | PRN
  Administered 2016-06-25: 20 mg via INTRAVENOUS

## 2016-06-25 MED ORDER — PHENYLEPHRINE 40 MCG/ML (10ML) SYRINGE FOR IV PUSH (FOR BLOOD PRESSURE SUPPORT)
PREFILLED_SYRINGE | INTRAVENOUS | Status: AC
  Filled 2016-06-25: qty 10

## 2016-06-25 MED ORDER — PHENYLEPHRINE 40 MCG/ML (10ML) SYRINGE FOR IV PUSH (FOR BLOOD PRESSURE SUPPORT)
PREFILLED_SYRINGE | INTRAVENOUS | Status: DC | PRN
  Administered 2016-06-25 (×4): 80 ug via INTRAVENOUS

## 2016-06-25 MED ORDER — SUGAMMADEX SODIUM 200 MG/2ML IV SOLN
INTRAVENOUS | Status: DC | PRN
  Administered 2016-06-25: 300 mg via INTRAVENOUS

## 2016-06-25 MED ORDER — 0.9 % SODIUM CHLORIDE (POUR BTL) OPTIME
TOPICAL | Status: DC | PRN
  Administered 2016-06-25: 2000 mL

## 2016-06-25 MED ORDER — FENTANYL CITRATE (PF) 100 MCG/2ML IJ SOLN
INTRAMUSCULAR | Status: AC
  Filled 2016-06-25: qty 2

## 2016-06-25 MED ORDER — POTASSIUM CHLORIDE 10 MEQ/100ML IV SOLN
10.0000 meq | Freq: Once | INTRAVENOUS | Status: AC
  Administered 2016-06-25: 10 meq via INTRAVENOUS
  Filled 2016-06-25: qty 100

## 2016-06-25 MED ORDER — DEXAMETHASONE SODIUM PHOSPHATE 10 MG/ML IJ SOLN
INTRAMUSCULAR | Status: AC
  Filled 2016-06-25: qty 1

## 2016-06-25 MED ORDER — DIPHENHYDRAMINE HCL 50 MG/ML IJ SOLN: 12.5000 mg | Freq: Four times a day (QID) | INTRAMUSCULAR | Status: DC | PRN

## 2016-06-25 MED ORDER — LIDOCAINE HCL (CARDIAC) 20 MG/ML IV SOLN
INTRAVENOUS | Status: DC | PRN
  Administered 2016-06-25: 50 mg via INTRAVENOUS

## 2016-06-25 MED ORDER — FENTANYL CITRATE (PF) 100 MCG/2ML IJ SOLN
25.0000 ug | INTRAMUSCULAR | Status: DC | PRN
  Administered 2016-06-25 (×2): 50 ug via INTRAVENOUS

## 2016-06-25 MED ORDER — CHLORHEXIDINE GLUCONATE 0.12 % MT SOLN
15.0000 mL | Freq: Two times a day (BID) | OROMUCOSAL | Status: DC
  Administered 2016-06-25 – 2016-06-26 (×3): 15 mL via OROMUCOSAL
  Filled 2016-06-25 (×3): qty 15

## 2016-06-25 SURGICAL SUPPLY — 20 items
CHLORAPREP W/TINT 26ML (MISCELLANEOUS) ×3 IMPLANT
COVER SURGICAL LIGHT HANDLE (MISCELLANEOUS) ×1 IMPLANT
DRAPE LAPAROSCOPIC ABDOMINAL (DRAPES) ×3 IMPLANT
DRSG OPSITE POSTOP 4X8 (GAUZE/BANDAGES/DRESSINGS) ×2 IMPLANT
ELECT REM PT RETURN 9FT ADLT (ELECTROSURGICAL) ×3
ELECTRODE REM PT RTRN 9FT ADLT (ELECTROSURGICAL) ×1 IMPLANT
GLOVE BIOGEL PI IND STRL 7.0 (GLOVE) ×1 IMPLANT
GLOVE BIOGEL PI INDICATOR 7.0 (GLOVE) ×4
GLOVE SURG SIGNA 7.5 PF LTX (GLOVE) ×5 IMPLANT
GOWN STRL REUS W/TWL LRG LVL3 (GOWN DISPOSABLE) ×1 IMPLANT
GOWN STRL REUS W/TWL XL LVL3 (GOWN DISPOSABLE) ×8 IMPLANT
KIT BASIN OR (CUSTOM PROCEDURE TRAY) ×3 IMPLANT
LIGASURE IMPACT 36 18CM CVD LR (INSTRUMENTS) IMPLANT
NS IRRIG 1000ML POUR BTL (IV SOLUTION) ×3 IMPLANT
PACK GENERAL/GYN (CUSTOM PROCEDURE TRAY) ×3 IMPLANT
SHEARS HARMONIC ACE PLUS 36CM (ENDOMECHANICALS) IMPLANT
STAPLER VISISTAT 35W (STAPLE) ×3 IMPLANT
TOWEL OR 17X26 10 PK STRL BLUE (TOWEL DISPOSABLE) ×3 IMPLANT
TRAY FOLEY W/METER SILVER 14FR (SET/KITS/TRAYS/PACK) ×3 IMPLANT
TRAY FOLEY W/METER SILVER 16FR (SET/KITS/TRAYS/PACK) ×1 IMPLANT

## 2016-06-25 NOTE — Progress Notes (Signed)
Patient ID: Brenda Gibson, female   DOB: 08/26/1941, 75 y.o.   MRN: [id number]   Remains distended with no flatus xrays show persistent SBO Etiology unclear given no previous surgery. I discussed this with her in detail.  I recommend an exploratory laparotomy. I discussed the risks which include but are not limited to bleeding, infection, need for bowel resection, injury to surrounding structures, cardiopulmonary issues, etc.  She agrees to proceed.

## 2016-06-25 NOTE — Progress Notes (Signed)
PT Cancellation Note  Patient Details Name: Brenda Gibson MRN: [id number] DOB: Sep 22, 1941   Cancelled Treatment:    Reason Eval/Treat Not Completed:  (going to OR)   Marcelino Freestone PT [id number]  06/25/2016, 11:39 AM

## 2016-06-25 NOTE — Anesthesia Postprocedure Evaluation (Signed)
Anesthesia Post Note  Patient: Brenda Gibson  Procedure(s) Performed: Procedure(s) (LRB): EXPLORATORY LAPAROTOMY, lysis of adhesion (N/A)  Patient location during evaluation: PACU Anesthesia Type: General Level of consciousness: awake and alert Pain management: pain level controlled Vital Signs Assessment: post-procedure vital signs reviewed and stable Respiratory status: spontaneous breathing, nonlabored ventilation, respiratory function stable and patient connected to nasal cannula oxygen Cardiovascular status: blood pressure returned to baseline and stable Postop Assessment: no signs of nausea or vomiting Anesthetic complications: no    Last Vitals:  Vitals:   06/25/16 1445 06/25/16 1448  BP: 119/62   Pulse: 98 99  Resp: 15 15  Temp:      Last Pain:  Vitals:   06/25/16 1438  TempSrc:   PainSc: 5                  Dewon Mendizabal,JAMES TERRILL

## 2016-06-25 NOTE — Care Management Important Message (Signed)
Important Message  Patient Details  Name: Brenda Gibson MRN: [id number] Date of Birth: 1941-05-27   Medicare Important Message Given:  Yes    [name] 06/25/2016, 8:56 AMImportant Message  Patient Details  Name: Brenda Gibson MRN: [id number] Date of Birth: 10-08-1941   Medicare Important Message Given:  Yes    [name] 06/25/2016, 8:56 AM

## 2016-06-25 NOTE — Op Note (Signed)
EXPLORATORY LAPAROTOMY, lysis of adhesion  Procedure Note  Brenda Gibson 06/19/2016 - 06/25/2016   Pre-op Diagnosis: small bowel obstruction     Post-op Diagnosis: same  Procedure(s): EXPLORATORY LAPAROTOMY, lysis of adhesion  Surgeon(s): Coralie Keens, MD  Anesthesia: General  Staff:  Circulator: Enid Cutter, RN Relief Circulator: Maren Reamer, RN Relief Scrub: Joellen Jersey, CST Scrub Person: Kriste Basque; [address] [place], [place]  Estimated Blood Loss: Minimal               Findings: single adhesive band creating Gibson near complete SBO.          Brenda Gibson   Date: 06/25/2016  Time: 1:58 PM

## 2016-06-25 NOTE — Anesthesia Preprocedure Evaluation (Addendum)
Anesthesia Evaluation  Patient identified by MRN, date of birth, ID band Patient awake    Reviewed: Allergy & Precautions, NPO status , Patient's Chart, lab work & pertinent test results  History of Anesthesia Complications Negative for: history of anesthetic complications  Airway Mallampati: II  TM Distance: >3 FB Neck ROM: Full    Dental no notable dental hx. (+) Dental Advisory Given   Pulmonary Current Smoker,    Pulmonary exam normal breath sounds clear to auscultation       Cardiovascular hypertension, Normal cardiovascular exam Rhythm:Regular Rate:Normal     Neuro/Psych negative neurological ROS  negative psych ROS   GI/Hepatic Neg liver ROS, SBO    Endo/Other  negative endocrine ROS  Renal/GU negative Renal ROS  negative genitourinary   Musculoskeletal negative musculoskeletal ROS (+)   Abdominal   Peds negative pediatric ROS (+)  Hematology negative hematology ROS (+)   Anesthesia Other Findings   Reproductive/Obstetrics negative OB ROS                             Anesthesia Physical Anesthesia Plan  ASA: II and emergent  Anesthesia Plan: General   Post-op Pain Management:    Induction: Intravenous  Airway Management Planned: Oral ETT  Additional Equipment:   Intra-op Plan:   Post-operative Plan: Extubation in OR  Informed Consent: I have reviewed the patients History and Physical, chart, labs and discussed the procedure including the risks, benefits and alternatives for the proposed anesthesia with the patient or authorized representative who has indicated his/her understanding and acceptance.   Dental advisory given  Plan Discussed with: CRNA  Anesthesia Plan Comments:         Anesthesia Quick Evaluation

## 2016-06-25 NOTE — Anesthesia Procedure Notes (Signed)
Procedure Name: Intubation Date/Time: 06/25/2016 1:26 PM Performed by: Vicente Weidler, Virgel Gess Pre-anesthesia Checklist: Patient identified, Emergency Drugs available, Suction available, Patient being monitored and Timeout performed Patient Re-evaluated:Patient Re-evaluated prior to inductionOxygen Delivery Method: Circle system utilized Preoxygenation: Pre-oxygenation with 100% oxygen Intubation Type: IV induction, Cricoid Pressure applied and Rapid sequence Ventilation: Mask ventilation without difficulty Laryngoscope Size: Mac and 4 Grade View: Grade II Tube type: Oral Tube size: 7.0 mm Number of attempts: 1 Airway Equipment and Method: Stylet Placement Confirmation: ETT inserted through vocal cords under direct vision,  positive ETCO2,  CO2 detector and breath sounds checked- equal and bilateral Secured at: 21 cm Tube secured with: Tape Dental Injury: Teeth and Oropharynx as per pre-operative assessment

## 2016-06-25 NOTE — Progress Notes (Addendum)
PROGRESS NOTE    Brenda Gibson  [id number] DOB: 1941-07-23 DOA: 06/19/2016 PCP: [name], FNP    Brief Narrative: Brenda Gibson is a 75 y.o. female with medical history significant of no significant past medical history the comes into the hospital for abdominal pain that started on the day of admission. CBC was done that shows mild leukocytosis-  CT scan was obtained that shows emphysematous cystitis and thickening of Jujunal wall and mild ascites - UA showed a few bacteria with 3-6 white blood cells. She subsequently develops ileus vs partial SBO and NG tube was placed.    Subjective: Abdominal pain is controlled with PCA. Has a dry cough. No chest pain. No fever or chills.   Assessment & Plan:   SBO - initially suspected to be enteritis- Rocephin and Flagyl was given - Surgery consulted - SBO protocol revealed contrast in stomach and with obstruction in mid to distal small bowel - plan for OR today   Emphysematous cystitis Lactic acid is less than 2- she is afebrile with a mild leukocytosis, UA showed 6-30 white blood cells with a few bacteria. Continue with  IV fluids and IV Rocephin x 7 days- stop date 7/25 - she has completed her course Urine culture; multiples bacteria. Repeated urine culture 8-20 no growth.   Hypokalemia - daily repletion - Mg normal  Leukocytosis - treating for enteritis and UTI. Trending down.   Tobacco abuse: Counseling was done - Nicotine patch  HTN - PRN IV hydralazine.   DVT prophylaxis; lovenox Code Status: full code.  Family Communication:care discussed with patient  Disposition Plan: home when stable   Consultants:   gen surgery    Procedures:   none  Antimicrobials:   Ceftriaxone 8-19  Flagyl 8-20     Objective: Vitals:   06/24/16 1830 06/24/16 2102 06/25/16 0519 06/25/16 1217  BP: (!) 185/85 134/70 (!) 157/69 (!) 170/75  Pulse:  (!) 106 88 84  Resp:  20 19 20   Temp:  97.4 F (36.3 C) 97.7 F  (36.5 C) 97.8 F (36.6 C)  TempSrc:  Oral Oral Oral  SpO2:  93% 95% 94%  Weight:   63.5 kg (140 lb)   Height:        Intake/Output Summary (Last 24 hours) at 06/25/16 1228 Last data filed at 06/25/16 0806  Gross per 24 hour  Intake             1125 ml  Output              950 ml  Net              175 ml   Filed Weights   06/19/16 0653 06/24/16 0652 06/25/16 0519  Weight: 54.4 kg (120 lb) 62.8 kg (138 lb 7.2 oz) 63.5 kg (140 lb)    Examination:  General exam: Appears calm and comfortable  Respiratory system: Clear to auscultation. Respiratory effort normal. Cardiovascular system: S1 & S2 heard, RRR. No JVD, murmurs, rubs, gallops or clicks. No pedal edema. Gastrointestinal system: Abdomen is mild tender, and distended, no rigidity- no bowel sounds- NG tube draining bilious material Central nervous system: Alert and oriented. No focal neurological deficits. Extremities: Symmetric 5 x 5 power. Skin: No rashes, lesions or ulcers Psychiatry: Judgement and insight appear normal. Mood & affect appropriate.     Data Reviewed: I have personally reviewed following labs and imaging studies  CBC:  Recent Labs Lab 06/19/16 0739 06/20/16 0434 06/21/16 0426 06/22/16 0345  06/23/16 0404 06/24/16 0416  WBC 13.8* 17.2* 15.8* 13.6* 11.7* 7.7  NEUTROABS 11.6*  --   --   --   --   --   HGB 13.1 15.2* 12.3 13.3 12.8 13.5  HCT 39.2 45.1 37.8 40.8 38.3 40.8  MCV 85.0 84.1 84.4 85.5 83.4 85.2  PLT 275 341 285 330 300 [id number]   Basic Metabolic Panel:  Recent Labs Lab 06/21/16 0426 06/22/16 0345 06/23/16 0404 06/24/16 0416 06/25/16 0334  NA 135 135 138 141 142  K 4.0 3.6 3.1* 3.2* 3.2*  CL 105 105 108 109 111  CO2 23 23 22 23  20*  GLUCOSE 135* 138* 119* 105* 95  BUN 23* 16 19 21* 21*  CREATININE 0.52 0.50 0.36* 0.55 0.50  CALCIUM 8.7* 8.8* 8.9 8.7* 8.3*  MG  --   --   --  2.1  --    GFR: Estimated Creatinine Clearance: 57.5 mL/min (by C-G formula based on SCr of 0.8  mg/dL). Liver Function Tests:  Recent Labs Lab 06/19/16 0739  AST 17  ALT 14  ALKPHOS 62  BILITOT 0.6  PROT 6.9  ALBUMIN 3.9    Recent Labs Lab 06/19/16 0739  LIPASE 24   No results for input(s): AMMONIA in the last 168 hours. Coagulation Profile: No results for input(s): INR, PROTIME in the last 168 hours. Cardiac Enzymes: No results for input(s): CKTOTAL, CKMB, CKMBINDEX, TROPONINI in the last 168 hours. BNP (last 3 results) No results for input(s): PROBNP in the last 8760 hours. HbA1C: No results for input(s): HGBA1C in the last 72 hours. CBG: No results for input(s): GLUCAP in the last 168 hours. Lipid Profile: No results for input(s): CHOL, HDL, LDLCALC, TRIG, CHOLHDL, LDLDIRECT in the last 72 hours. Thyroid Function Tests: No results for input(s): TSH, T4TOTAL, FREET4, T3FREE, THYROIDAB in the last 72 hours. Anemia Panel: No results for input(s): VITAMINB12, FOLATE, FERRITIN, TIBC, IRON, RETICCTPCT in the last 72 hours. Sepsis Labs:  Recent Labs Lab 06/19/16 0756 06/19/16 1042  LATICACIDVEN 1.53 1.16    Recent Results (from the past 240 hour(s))  Urine culture     Status: Abnormal   Collection Time: 06/19/16  8:54 AM  Result Value Ref Range Status   Specimen Description URINE, RANDOM  Final   Special Requests NONE  Final   Culture MULTIPLE SPECIES PRESENT, SUGGEST RECOLLECTION (A)  Final   Report Status 06/20/2016 FINAL  Final  Urine culture     Status: None   Collection Time: 06/20/16  7:42 AM  Result Value Ref Range Status   Specimen Description URINE, CLEAN CATCH  Final   Special Requests NONE  Final   Culture NO GROWTH Performed at [facility]   Final   Report Status 06/22/2016 FINAL  Final  Surgical pcr screen     Status: None   Collection Time: 06/25/16  9:33 AM  Result Value Ref Range Status   MRSA, PCR NEGATIVE NEGATIVE Final   Staphylococcus aureus NEGATIVE NEGATIVE Final    Comment:        The Xpert SA Assay (FDA approved  for NASAL specimens in patients over 18 years of age), is one component of a comprehensive surveillance program.  Test performance has been validated by [facility] for patients greater than or equal to 74 year old. It is not intended to diagnose infection nor to guide or monitor treatment.          Radiology Studies: Dg Abd 1 View  Result Date:  06/24/2016 CLINICAL DATA:  Small bowel obstruction EXAM: ABDOMEN - 1 VIEW COMPARISON:  Supine abdominal radiograph June 22, 2016 FINDINGS: There remain loops of moderately distended gas-filled small bowel in the upper and mid abdomen. There is no significant colonic gas. There is some stool in the right colon. There is no rectal gas. There are no free extraluminal gas collections. The esophagogastric tube tip in proximal port project in the gastric body. IMPRESSION: Persistent mid to distal small bowel obstruction. No evidence of perforation. Electronically Signed   By: David  Martinique M.D.   On: 06/24/2016 09:09   Dg Abd Portable 1v  Result Date: 06/25/2016 CLINICAL DATA:  Small-bowel obstruction. EXAM: PORTABLE ABDOMEN - 1 VIEW COMPARISON:  06/24/2016. FINDINGS: NG tube noted in stable position within the stomach. Persistent dilated loops of small bowel with a paucity of intra colonic gas again noted. Findings consistent with persistent small-bowel obstruction. No interim change. No free air. Bibasilar subsegmental atelectasis. IMPRESSION: 1. NG tube in stable position. 2. Persistent unchanged small bowel obstruction. Electronically Signed   By: Marcello Moores  Register   On: 06/25/2016 06:58        Scheduled Meds: . antiseptic oral rinse  7 mL Mouth Rinse q12n4p  . bisacodyl  10 mg Rectal Once  .  ceFAZolin (ANCEF) IV  2 g Intravenous Once  . cefTRIAXone (ROCEPHIN)  IV  1 g Intravenous Q24H  . chlorhexidine  15 mL Mouth Rinse BID  . enoxaparin (LOVENOX) injection  40 mg Subcutaneous Q24H  . famotidine (PEPCID) IV  20 mg Intravenous Q12H  .  metronidazole  500 mg Intravenous Q8H  . nicotine  14 mg Transdermal Daily  . trifluoperazine  2.5 mg Oral q1800   Continuous Infusions: . sodium chloride 1,000 mL (06/24/16 2157)     LOS: 6 days    Time spent: 35 minutes.     Debbe Odea, MD [facility] Pager [phone]  If 7PM-7AM, please contact night-coverage [email or web address] Password [facility] 06/25/2016, 12:28 PM

## 2016-06-25 NOTE — Transfer of Care (Signed)
Immediate Anesthesia Transfer of Care Note  Patient: Brenda Gibson  Procedure(s) Performed: Procedure(s): EXPLORATORY LAPAROTOMY, lysis of adhesion (N/A)  Patient Location: PACU  Anesthesia Type:General  Level of Consciousness:  sedated, patient cooperative and responds to stimulation  Airway & Oxygen Therapy:Patient Spontanous Breathing and Patient connected to face mask oxgen  Post-op Assessment:  Report given to PACU RN and Post -op Vital signs reviewed and stable  Post vital signs:  Reviewed and stable  Last Vitals:  Vitals:   06/25/16 1217 06/25/16 1406  BP: (!) 170/75 139/71  Pulse: 84 (!) 105  Resp: 20 (P) 19  Temp: 36.6 C (P) [id number] C    Complications: No apparent anesthesia complications

## 2016-06-25 NOTE — Progress Notes (Signed)
Nursing Note: pt voided 250 cc this shift.wbb

## 2016-06-25 NOTE — Progress Notes (Signed)
CM continues to follow along. Pt still unsure who she wants to use for [facility] services. Pt for OR today. Will continue with disposition planning closer to discharge. Marney Doctor RN,BSN,NCM [phone]

## 2016-06-26 LAB — BASIC METABOLIC PANEL
ANION GAP: 8 (ref 5–15)
BUN: 16 mg/dL (ref 6–20)
CALCIUM: 8 mg/dL — AB (ref 8.9–10.3)
CO2: 23 mmol/L (ref 22–32)
Chloride: 112 mmol/L — ABNORMAL HIGH (ref 101–111)
Creatinine, Ser: 0.48 mg/dL (ref 0.44–1.00)
GLUCOSE: 113 mg/dL — AB (ref 65–99)
POTASSIUM: 3.7 mmol/L (ref 3.5–5.1)
SODIUM: 143 mmol/L (ref 135–145)

## 2016-06-26 LAB — CBC
HEMATOCRIT: 35.9 % — AB (ref 36.0–46.0)
HEMOGLOBIN: 11.7 g/dL — AB (ref 12.0–15.0)
MCH: 28.1 pg (ref 26.0–34.0)
MCHC: 32.6 g/dL (ref 30.0–36.0)
MCV: 86.1 fL (ref 78.0–100.0)
Platelets: 307 10*3/uL (ref 150–400)
RBC: 4.17 MIL/uL (ref 3.87–5.11)
RDW: 16.5 % — ABNORMAL HIGH (ref 11.5–15.5)
WBC: 14.5 10*3/uL — AB (ref 4.0–10.5)

## 2016-06-26 MED ORDER — ORAL CARE MOUTH RINSE
15.0000 mL | Freq: Two times a day (BID) | OROMUCOSAL | Status: DC
  Administered 2016-06-26 – 2016-07-06 (×21): 15 mL via OROMUCOSAL

## 2016-06-26 MED ORDER — CHLORHEXIDINE GLUCONATE 0.12 % MT SOLN
15.0000 mL | Freq: Two times a day (BID) | OROMUCOSAL | Status: DC
  Administered 2016-06-26 – 2016-07-07 (×20): 15 mL via OROMUCOSAL
  Filled 2016-06-26 (×21): qty 15

## 2016-06-26 NOTE — Progress Notes (Signed)
Patient ID: Brenda Gibson, female   DOB: 1941-07-30, 75 y.o.   MRN: [id number] 1 Day Post-Op  Subjective: Good pain control, no SOB or other C/O  Objective: Vital signs in last 24 hours: Temp:  [97.4 F (36.3 C)-98.5 F (36.9 C)] 97.4 F (36.3 C) (08/26 0521) Pulse Rate:  [84-107] 92 (08/26 0521) Resp:  [9-25] 9 (08/26 0818) BP: (115-170)/(55-85) 129/68 (08/26 0521) SpO2:  [90 %-98 %] 93 % (08/26 0818) Weight:  [64.9 kg (143 lb)] 64.9 kg (143 lb) (08/26 0521) Last BM Date: 06/25/16  Intake/Output from previous day: 08/25 0701 - 08/26 0700 In: 4705 [I.V.:4405; NG/GT:30; IV Piggyback:270] Out: 2100 [Urine:900; Emesis/NG output:1150; Blood:50] Intake/Output this shift: No intake/output data recorded.  General appearance: Drowsy, no distress Resp: clear to auscultation bilaterally GI: Mild distention, soft and non tender Incision/Wound: Dressing clean and dry  Lab Results:   Recent Labs  06/24/16 0416 06/26/16 0338  WBC 7.7 14.5*  HGB 13.5 11.7*  HCT 40.8 35.9*  PLT 319 307   BMET  Recent Labs  06/25/16 0334 06/26/16 0338  NA 142 143  K 3.2* 3.7  CL 111 112*  CO2 20* 23  GLUCOSE 95 113*  BUN 21* 16  CREATININE 0.50 0.48  CALCIUM 8.3* 8.0*     Studies/Results: Dg Abd 1 View  Result Date: 06/24/2016 CLINICAL DATA:  Small bowel obstruction EXAM: ABDOMEN - 1 VIEW COMPARISON:  Supine abdominal radiograph June 22, 2016 FINDINGS: There remain loops of moderately distended gas-filled small bowel in the upper and mid abdomen. There is no significant colonic gas. There is some stool in the right colon. There is no rectal gas. There are no free extraluminal gas collections. The esophagogastric tube tip in proximal port project in the gastric body. IMPRESSION: Persistent mid to distal small bowel obstruction. No evidence of perforation. Electronically Signed   By: David  Martinique M.D.   On: 06/24/2016 09:09   Dg Abd Portable 1v  Result Date: 06/25/2016 CLINICAL DATA:   Small-bowel obstruction. EXAM: PORTABLE ABDOMEN - 1 VIEW COMPARISON:  06/24/2016. FINDINGS: NG tube noted in stable position within the stomach. Persistent dilated loops of small bowel with a paucity of intra colonic gas again noted. Findings consistent with persistent small-bowel obstruction. No interim change. No free air. Bibasilar subsegmental atelectasis. IMPRESSION: 1. NG tube in stable position. 2. Persistent unchanged small bowel obstruction. Electronically Signed   By: Marcello Moores  Register   On: 06/25/2016 06:58    Anti-infectives: Anti-infectives    Start     Dose/Rate Route Frequency Ordered Stop   06/25/16 0900  ceFAZolin (ANCEF) IVPB 2g/100 mL premix     2 g 200 mL/hr over 30 Minutes Intravenous  Once 06/25/16 0839 06/25/16 1315   06/20/16 1300  metroNIDAZOLE (FLAGYL) IVPB 500 mg     500 mg 100 mL/hr over 60 Minutes Intravenous Every 8 hours 06/20/16 1203     06/20/16 1000  cefTRIAXone (ROCEPHIN) 1 g in dextrose 5 % 50 mL IVPB     1 g 100 mL/hr over 30 Minutes Intravenous Every 24 hours 06/19/16 1254     06/19/16 1100  cefTRIAXone (ROCEPHIN) 1 g in dextrose 5 % 50 mL IVPB     1 g 100 mL/hr over 30 Minutes Intravenous  Once 06/19/16 1052 06/19/16 1129      Assessment/Plan: s/p Procedure(s): EXPLORATORY LAPAROTOMY, lysis of adhesion Stable post op OOB today Await resolution of ileus   LOS: 7 days    Teddi Badalamenti T 06/26/2016

## 2016-06-26 NOTE — Progress Notes (Signed)
Physical Therapy Treatment Patient Details Name: Brenda Gibson MRN: [id number] DOB: 03/10/1941 Today's Date: 06/26/2016    History of Present Illness , S/P exploratory lap for SBO on 06/25/16.    PT Comments    The patient is very drowsy but motivated to ambulate. Oxygen stas dropping without  3 liters. Patient does not have 24/7 caregivers so recommend SNF prior to home.   Follow Up Recommendations  SNF;Supervision/Assistance - 24 hour     Equipment Recommendations  None recommended by PT    Recommendations for Other Services       Precautions / Restrictions Precautions Precautions: Fall Precaution Comments: NG, abd. incision    Mobility  Bed Mobility Overal bed mobility: Needs Assistance Bed Mobility: Rolling;Sidelying to Sit Rolling: Mod assist Sidelying to sit: Mod assist       General bed mobility comments: cues for log roll  Transfers Overall transfer level: Needs assistance Equipment used: Rolling walker (2 wheeled)   Sit to Stand: Mod assist         General transfer comment: assist to power up  Ambulation/Gait Ambulation/Gait assistance: Mod assist;+2 safety/equipment Ambulation Distance (Feet): 45 Feet Assistive device: Rolling walker (2 wheeled) Gait Pattern/deviations: Step-through pattern;Decreased stride length     General Gait Details: assist  for RW in turning, oxygen sats on RA initially down to 83%. Replaced on 3 liters with sats staying ~89 while ambulating.   Stairs            Wheelchair Mobility    Modified Rankin (Stroke Patients Only)       Balance                                    Cognition Arousal/Alertness: Lethargic;Suspect due to medications Behavior During Therapy: [facility] for tasks assessed/performed Overall Cognitive Status: Within Functional Limits for tasks assessed                      Exercises      General Comments        Pertinent Vitals/Pain Pain Assessment: No/denies  pain    Home Living                      Prior Function            PT Goals (current goals can now be found in the care plan section) Acute Rehab PT Goals PT Goal Formulation: With patient Time For Goal Achievement: 07/10/16 Potential to Achieve Goals: Good Progress towards PT goals: Progressing toward goals    Frequency  Min 3X/week    PT Plan Discharge plan needs to be updated    Co-evaluation             End of Session Equipment Utilized During Treatment: Oxygen Activity Tolerance: Patient tolerated treatment well Patient left: in chair;with call bell/phone within reach;with chair alarm set     Time: 0929-1000 PT Time Calculation (min) (ACUTE ONLY): 31 min  Charges:  $Gait Training: 8-22 mins $Therapeutic Activity: 8-22 mins                    G Codes:      Claretha Cooper 06/26/2016, 10:08 AM Tresa Endo PT [phone]

## 2016-06-26 NOTE — Op Note (Signed)
[name], [name]               ACCOUNT NO.:  [id number]  MEDICAL RECORD NO.:  [id number]  LOCATION:  [id number]                         FACILITY:  [facility]  PHYSICIAN:  Coralie Keens, M.D. DATE OF BIRTH:  06-01-1941  DATE OF PROCEDURE:  06/25/2016 DATE OF DISCHARGE:                              OPERATIVE REPORT   PREOPERATIVE DIAGNOSIS:  Small-bowel obstruction.  POSTOPERATIVE DIAGNOSIS:  Small-bowel obstruction.  PROCEDURE: 1. Exploratory laparotomy. 2. Lysis of adhesions.  SURGEON:  Coralie Keens, MD.  ANESTHESIA:  General endotracheal anesthesia.  ESTIMATED BLOOD LOSS:  Minimal.  INDICATIONS:  This is a 75 year old female, who was admitted to the hospital with nausea and vomiting.  She was found to have what appear to be cystitis and enteritis.  She did not improve with her bowel dilation. A small bowel protocol was performed which showed a high-grade obstruction.  She still felt to improve, so decision made to proceed to the operating room.  FINDINGS:  The patient was found to have a near-closed loop obstruction with near-complete obstruction of the distal ileum.  This was from a single adhesive band.  There was significant bruising of her mesentery and dilation of her small bowel.  There was no other intraabdominal pathology identified.  PROCEDURE IN DETAIL:  The patient was brought to the operating room, identified as Brenda Gibson.  She was placed supine on the operating table.  General anesthesia was induced.  Her abdomen was then prepped and draped in usual sterile fashion after a Foley catheter was inserted. I created a midline incision with a scalpel.  I took this down through the fascia with electrocautery.  I then opened the perineum in the entire length of the incision.  Upon entering the abdomen, there was a moderate amount of hemorrhagic fluid.  The small bowel was quite dilated.  I was able to eviscerate the small bowel and started running it  proximally and distally.  The patient had a single adhesive band that was creating obstruction in 2 separate places.  Once I lysed adhesions, both areas opened up widely.  One of the area was tried at the terminal ilium.  At this point, I was able to easily milk small-bowel contents through both areas.  A small serosal tear in the small bowel was closed with interrupted silk sutures.  Again, there was significant bruising of the small bowel mesentery.  No other intraabdominal pathology was identified.  After I had milked some of the contents back up into the stomach get out of the NG, I was then able to place all the bowel back into the abdominal cavity.  I then irrigated the abdomen with saline.  I then closed the midline fascia with a running looped PDS suture.  The skin was then irrigated and closed with skin staples.  The patient tolerated the procedure well.  All sponge, needle, and instrument counts were correct at the end of procedure.  The patient was then extubated in the operating room and taken in stable condition to recovery room.     Coralie Keens, M.D.     DB/MEDQ  D:  06/25/2016  T:  06/26/2016  Job:  [id number]

## 2016-06-26 NOTE — Progress Notes (Signed)
PROGRESS NOTE    Brenda Gibson  [id number] DOB: [birth year] DOA: 06/19/2016 PCP: [name], FNP    Brief Narrative: Brenda Gibson is a 75 y.o. female with medical history significant of no significant past medical history the comes into the hospital for abdominal pain that started on the day of admission. CBC was done that shows mild leukocytosis-  CT scan was obtained that shows emphysematous cystitis and thickening of Jujunal wall and mild ascites - UA showed a few bacteria with 3-6 white blood cells. She subsequently develops ileus vs partial SBO and NG tube was placed.    Subjective: Abdominal pain is controlled with PCA. Has a dry cough. No chest pain. No fever or chills.   Assessment & Plan:   SBO - initially suspected to be enteritis- Rocephin and Flagyl was given - Surgery consulted - SBO protocol revealed contrast in stomach and with obstruction in mid to distal small bowel - s/p lysis of adhesions on 8/25 - management per gen surgery   Emphysematous cystitis Lactic acid is less than 2- she is afebrile with a mild leukocytosis, UA showed 6-30 white blood cells with a few bacteria. Continue with  IV fluids and IV Rocephin x 7 days- stop date 7/25 - she has completed her course Urine culture; multiples bacteria. Repeated urine culture 8-20 no growth.   Hypokalemia - daily repletion - Mg normal  Leukocytosis - treating for enteritis and UTI. Trending down.   Tobacco abuse: Counseling was done - Nicotine patch  HTN - PRN IV hydralazine.   DVT prophylaxis; lovenox Code Status: full code.  Family Communication:care discussed with patient  Disposition Plan: home when stable   Consultants:   gen surgery    Procedures:   none  Antimicrobials:   Ceftriaxone 8-19  Flagyl 8-20     Objective: Vitals:   06/26/16 0818 06/26/16 1018 06/26/16 1209 06/26/16 1405  BP:  (!) 117/55  (!) 127/59  Pulse:  89  83  Resp: (!) 9 11 12 10   Temp:  97.7  F (36.5 C)  98.2 F (36.8 C)  TempSrc:  Oral  Oral  SpO2: 93% 93% 93% 95%  Weight:      Height:        Intake/Output Summary (Last 24 hours) at 06/26/16 1457 Last data filed at 06/26/16 0522  Gross per 24 hour  Intake             2150 ml  Output              800 ml  Net             1350 ml   Filed Weights   06/24/16 0652 06/25/16 0519 06/26/16 0521  Weight: 62.8 kg (138 lb 7.2 oz) 63.5 kg (140 lb) 64.9 kg (143 lb)    Examination:  General exam: Appears calm and comfortable  Respiratory system: Clear to auscultation. Respiratory effort normal. Cardiovascular system: S1 & S2 heard, RRR. No JVD, murmurs, rubs, gallops or clicks. No pedal edema. Gastrointestinal system: Abdomen is mild tender, and distended, no rigidity- no bowel sounds- NG tube draining bilious material Central nervous system: Alert and oriented. No focal neurological deficits. Extremities: Symmetric 5 x 5 power. Skin: No rashes, lesions or ulcers Psychiatry: Judgement and insight appear normal. Mood & affect appropriate.     Data Reviewed: I have personally reviewed following labs and imaging studies  CBC:  Recent Labs Lab 06/21/16 0426 06/22/16 0345 06/23/16 0404 06/24/16 0416 06/26/16  0338  WBC 15.8* 13.6* 11.7* 7.7 14.5*  HGB 12.3 13.3 12.8 13.5 11.7*  HCT 37.8 40.8 38.3 40.8 35.9*  MCV 84.4 85.5 83.4 85.2 86.1  PLT 285 330 300 319 [id number]   Basic Metabolic Panel:  Recent Labs Lab 06/22/16 0345 06/23/16 0404 06/24/16 0416 06/25/16 0334 06/26/16 0338  NA 135 138 141 142 143  K 3.6 3.1* 3.2* 3.2* 3.7  CL 105 108 109 111 112*  CO2 23 22 23  20* 23  GLUCOSE 138* 119* 105* 95 113*  BUN 16 19 21* 21* 16  CREATININE 0.50 0.36* 0.55 0.50 0.48  CALCIUM 8.8* 8.9 8.7* 8.3* 8.0*  MG  --   --  2.1  --   --    GFR: Estimated Creatinine Clearance: 57.5 mL/min (by C-G formula based on SCr of 0.8 mg/dL). Liver Function Tests: No results for input(s): AST, ALT, ALKPHOS, BILITOT, PROT, ALBUMIN in  the last 168 hours. No results for input(s): LIPASE, AMYLASE in the last 168 hours. No results for input(s): AMMONIA in the last 168 hours. Coagulation Profile: No results for input(s): INR, PROTIME in the last 168 hours. Cardiac Enzymes: No results for input(s): CKTOTAL, CKMB, CKMBINDEX, TROPONINI in the last 168 hours. BNP (last 3 results) No results for input(s): PROBNP in the last 8760 hours. HbA1C: No results for input(s): HGBA1C in the last 72 hours. CBG: No results for input(s): GLUCAP in the last 168 hours. Lipid Profile: No results for input(s): CHOL, HDL, LDLCALC, TRIG, CHOLHDL, LDLDIRECT in the last 72 hours. Thyroid Function Tests: No results for input(s): TSH, T4TOTAL, FREET4, T3FREE, THYROIDAB in the last 72 hours. Anemia Panel: No results for input(s): VITAMINB12, FOLATE, FERRITIN, TIBC, IRON, RETICCTPCT in the last 72 hours. Sepsis Labs: No results for input(s): PROCALCITON, LATICACIDVEN in the last 168 hours.  Recent Results (from the past 240 hour(s))  Urine culture     Status: Abnormal   Collection Time: 06/19/16  8:54 AM  Result Value Ref Range Status   Specimen Description URINE, RANDOM  Final   Special Requests NONE  Final   Culture MULTIPLE SPECIES PRESENT, SUGGEST RECOLLECTION (A)  Final   Report Status 06/20/2016 FINAL  Final  Urine culture     Status: None   Collection Time: 06/20/16  7:42 AM  Result Value Ref Range Status   Specimen Description URINE, CLEAN CATCH  Final   Special Requests NONE  Final   Culture NO GROWTH Performed at [facility]   Final   Report Status 06/22/2016 FINAL  Final  Surgical pcr screen     Status: None   Collection Time: 06/25/16  9:33 AM  Result Value Ref Range Status   MRSA, PCR NEGATIVE NEGATIVE Final   Staphylococcus aureus NEGATIVE NEGATIVE Final    Comment:        The Xpert SA Assay (FDA approved for NASAL specimens in patients over 18 years of age), is one component of a comprehensive  surveillance program.  Test performance has been validated by [facility] for patients greater than or equal to 85 year old. It is not intended to diagnose infection nor to guide or monitor treatment.          Radiology Studies: Dg Abd Portable 1v  Result Date: 06/25/2016 CLINICAL DATA:  Small-bowel obstruction. EXAM: PORTABLE ABDOMEN - 1 VIEW COMPARISON:  06/24/2016. FINDINGS: NG tube noted in stable position within the stomach. Persistent dilated loops of small bowel with a paucity of intra colonic gas again noted.  Findings consistent with persistent small-bowel obstruction. No interim change. No free air. Bibasilar subsegmental atelectasis. IMPRESSION: 1. NG tube in stable position. 2. Persistent unchanged small bowel obstruction. Electronically Signed   By: Hollins   On: 06/25/2016 06:58        Scheduled Meds: . bisacodyl  10 mg Rectal Once  . chlorhexidine  15 mL Mouth Rinse BID  . enoxaparin (LOVENOX) injection  40 mg Subcutaneous Q24H  . famotidine (PEPCID) IV  20 mg Intravenous Q12H  . mouth rinse  15 mL Mouth Rinse q12n4p  . morphine   Intravenous Q4H  . nicotine  14 mg Transdermal Daily  . trifluoperazine  2.5 mg Oral q1800   Continuous Infusions: . sodium chloride 125 mL/hr at 06/26/16 1046     LOS: 7 days    Time spent: 35 minutes.     Debbe Odea, MD [facility] Pager [phone]  If 7PM-7AM, please contact night-coverage [email or web address] Password [email or web address] 06/26/2016, 2:57 PM

## 2016-06-27 NOTE — Clinical Social Work Note (Signed)
Clinical Social Work Assessment  Patient Details  Name: Brenda Gibson MRN: [id number] Date of Birth: Apr 27, 1941  Date of referral:  06/27/16               Reason for consult:  Discharge Planning                Permission sought to share information with:    Permission granted to share information::     Name::        Agency::     Relationship::     Contact Information:     Housing/Transportation Living arrangements for the past 2 months:  Single Family Home Source of Information:  Patient Patient Interpreter Needed:  None Criminal Activity/Legal Involvement Pertinent to Current Situation/Hospitalization:  No - Comment as needed Significant Relationships:  Adult Children, Friend Lives with:  Self Do you feel safe going back to the place where you live?  Yes Need for family participation in patient care:  No (Coment)  Care giving concerns:  Pt is not at baseline and lives alone.    Social Worker assessment / plan:  CSW met with pt at bedside. Pt alert and oriented and reports she lives alone. She describes her son, Quillian Quince and a friend, Rush Landmark as supportive. Pt is very independent at baseline and still drives. She states she likes to stay active and said "I am like a 75 year old when it comes to activities." She is admitted due to SBO and is 2 days post-op. CSW discussed PT evaluation recommending SNF. Pt immediately refused and said she would never consider rehab. She feels she could get close to 24/7 assist at home for a short while between [employer]. CSW asked about home health and pt reports she will consider this, but would not commit at this time. SNF list provided and pt aware to contact CSW if she changes her mind. Will sign off, but please consult again if needed.   Employment status:  Retired Forensic scientist:  Medicare PT Recommendations:  [place] / Referral to community resources:  [place]  Patient/Family's Response to  care:  Pt states she is too independent to consider SNF and plans to return home.   Patient/Family's Understanding of and Emotional Response to Diagnosis, Current Treatment, and Prognosis:  Pt is aware of diagnosis and treatment plan. She is adamant that she will return home when medically stable.   Emotional Assessment Appearance:  Appears stated age Attitude/Demeanor/Rapport:  Other (Cooperative) Affect (typically observed):  Appropriate Orientation:  Oriented to Self, Oriented to Place, Oriented to  Time, Oriented to Situation Alcohol / Substance use:  Not Applicable Psych involvement (Current and /or in the community):  No (Comment)  Discharge Needs  Concerns to be addressed:  Discharge Planning Concerns Readmission within the last 30 days:  No Current discharge risk:  Lives alone Barriers to Discharge:  Continued Medical Work up   [employer], [employer], [place] 06/27/2016, 11:21 AM [phone]

## 2016-06-27 NOTE — Progress Notes (Signed)
Patient ID: Brenda Gibson, female   DOB: 1941/07/26, 75 y.o.   MRN: [id number] Patient ID: Brenda Gibson, female   DOB: August 25, 1941, 75 y.o.   MRN: [id number] 2 Days Post-Op  Subjective: Good pain control, no SOB or other C/O. Says she may have passed a little flatus yesterday but not sure  Objective: Vital signs in last 24 hours: Temp:  [97.7 F (36.5 C)-98.6 F (37 C)] 98.4 F (36.9 C) (08/27 0541) Pulse Rate:  [75-89] 80 (08/27 0541) Resp:  [9-13] 11 (08/27 0541) BP: (117-147)/(55-79) 147/79 (08/27 0541) SpO2:  [93 %-98 %] 95 % (08/27 0541) FiO2 (%):  [32 %] 32 % (08/26 1600) Weight:  [70.7 kg (155 lb 13.8 oz)] 70.7 kg (155 lb 13.8 oz) (08/27 0541) Last BM Date: 06/25/16  Intake/Output from previous day: 08/26 0701 - 08/27 0700 In: 1468.8 [I.V.:1418.8; IV Piggyback:50] Out: 600 [Urine:500; Emesis/NG output:100] Intake/Output this shift: No intake/output data recorded.  General appearance: alert cooperative in no distress Resp: clear to auscultation bilaterally GI: Mild distention, soft and non tender Incision/Wound: Dressing clean and dry  Lab Results:   Recent Labs  06/26/16 0338  WBC 14.5*  HGB 11.7*  HCT 35.9*  PLT 307   BMET  Recent Labs  06/25/16 0334 06/26/16 0338  NA 142 143  K 3.2* 3.7  CL 111 112*  CO2 20* 23  GLUCOSE 95 113*  BUN 21* 16  CREATININE 0.50 0.48  CALCIUM 8.3* 8.0*     Studies/Results: No results found.  Anti-infectives: Anti-infectives    Start     Dose/Rate Route Frequency Ordered Stop   06/25/16 0900  ceFAZolin (ANCEF) IVPB 2g/100 mL premix     2 g 200 mL/hr over 30 Minutes Intravenous  Once 06/25/16 0839 06/25/16 1315   06/20/16 1300  metroNIDAZOLE (FLAGYL) IVPB 500 mg  Status:  Discontinued     500 mg 100 mL/hr over 60 Minutes Intravenous Every 8 hours 06/20/16 1203 06/26/16 1454   06/20/16 1000  cefTRIAXone (ROCEPHIN) 1 g in dextrose 5 % 50 mL IVPB  Status:  Discontinued     1 g 100 mL/hr over 30 Minutes Intravenous  Every 24 hours 06/19/16 1254 06/26/16 1454   06/19/16 1100  cefTRIAXone (ROCEPHIN) 1 g in dextrose 5 % 50 mL IVPB     1 g 100 mL/hr over 30 Minutes Intravenous  Once 06/19/16 1052 06/19/16 1129      Assessment/Plan: s/p Procedure(s): EXPLORATORY LAPAROTOMY, lysis of adhesion Stable post op Continue mobilization with physical therapy Await resolution of ileus, Continue NG today.   LOS: 8 days    Rollin Kotowski T 06/27/2016

## 2016-06-27 NOTE — Progress Notes (Signed)
PROGRESS NOTE    Brenda Gibson  [id number] DOB: 08/05/1941 DOA: 06/19/2016 PCP: [name], FNP    Brief Narrative: Brenda Gibson is a 75 y.o. female with medical history significant of no significant past medical history the comes into the hospital for abdominal pain that started on the day of admission. CBC was done that shows mild leukocytosis-  CT scan was obtained that shows emphysematous cystitis and thickening of Jujunal wall and mild ascites - UA showed a few bacteria with 3-6 white blood cells. She subsequently develops ileus vs partial SBO and NG tube was placed.    Subjective: Abdominal pain is controlled with PCA. No new complaints. Dry cough has resolved. No chest pain. No fever or chills.   Assessment & Plan:   SBO - initially suspected to be enteritis and therefore Rocephin and Flagyl given for treatment - Surgery consulted - SBO protocol revealed contrast in stomach with obstruction in mid to distal small bowel- due to lack of resolution with antibiotics, she underwent ex lap - s/p lysis of adhesions on 8/25 - management per gen surgery   Emphysematous cystitis Lactic acid is less than 2- she is afebrile with a mild leukocytosis, UA showed 6-30 white blood cells with a few bacteria. Received IV Rocephin x 7 days- course ended on 7/25   Urine culture; multiples bacteria. Repeated urine culture 8-20 no growth.   Hypokalemia - daily repletion - Mg normal  Leukocytosis - treated for enteritis and UTI   Tobacco abuse: Counseling was done - Nicotine patch  HTN - PRN IV hydralazine.   DVT prophylaxis; lovenox Code Status: full code.  Family Communication:care discussed with patient  Disposition Plan: home when stable   Consultants:   gen surgery    Procedures:   none  Antimicrobials:   Ceftriaxone 8-19  Flagyl 8-20     Objective: Vitals:   06/27/16 0541 06/27/16 0800 06/27/16 1115 06/27/16 1200  BP: (!) 147/79  139/71   Pulse:  80  79   Resp: 11 14 14 13   Temp: 98.4 F (36.9 C)  98.1 F (36.7 C)   TempSrc: Oral  Oral   SpO2: 95% 100% 94% 96%  Weight: 70.7 kg (155 lb 13.8 oz)     Height:        Intake/Output Summary (Last 24 hours) at 06/27/16 1342 Last data filed at 06/27/16 0546  Gross per 24 hour  Intake          1468.75 ml  Output              600 ml  Net           868.75 ml   Filed Weights   06/25/16 0519 06/26/16 0521 06/27/16 0541  Weight: 63.5 kg (140 lb) 64.9 kg (143 lb) 70.7 kg (155 lb 13.8 oz)    Examination:  General exam: Appears calm and comfortable  Respiratory system: Clear to auscultation. Respiratory effort normal. Cardiovascular system: S1 & S2 heard, RRR. No JVD, murmurs, rubs, gallops or clicks. No pedal edema. Gastrointestinal system: Abdomen is mild tender, non-distended, no rigidity- no bowel sounds- NG tube draining bilious material Central nervous system: Alert and oriented. No focal neurological deficits. Extremities: Symmetric 5 x 5 power. Skin: No rashes, lesions or ulcers Psychiatry: Judgement and insight appear normal. Mood & affect appropriate.     Data Reviewed: I have personally reviewed following labs and imaging studies  CBC:  Recent Labs Lab 06/21/16 0426 06/22/16 0345 06/23/16 0404  06/24/16 0416 06/26/16 0338  WBC 15.8* 13.6* 11.7* 7.7 14.5*  HGB 12.3 13.3 12.8 13.5 11.7*  HCT 37.8 40.8 38.3 40.8 35.9*  MCV 84.4 85.5 83.4 85.2 86.1  PLT 285 330 300 319 [id number]   Basic Metabolic Panel:  Recent Labs Lab 06/22/16 0345 06/23/16 0404 06/24/16 0416 06/25/16 0334 06/26/16 0338  NA 135 138 141 142 143  K 3.6 3.1* 3.2* 3.2* 3.7  CL 105 108 109 111 112*  CO2 23 22 23  20* 23  GLUCOSE 138* 119* 105* 95 113*  BUN 16 19 21* 21* 16  CREATININE 0.50 0.36* 0.55 0.50 0.48  CALCIUM 8.8* 8.9 8.7* 8.3* 8.0*  MG  --   --  2.1  --   --    GFR: Estimated Creatinine Clearance: 62.5 mL/min (by C-G formula based on SCr of 0.8 mg/dL). Liver Function Tests: No  results for input(s): AST, ALT, ALKPHOS, BILITOT, PROT, ALBUMIN in the last 168 hours. No results for input(s): LIPASE, AMYLASE in the last 168 hours. No results for input(s): AMMONIA in the last 168 hours. Coagulation Profile: No results for input(s): INR, PROTIME in the last 168 hours. Cardiac Enzymes: No results for input(s): CKTOTAL, CKMB, CKMBINDEX, TROPONINI in the last 168 hours. BNP (last 3 results) No results for input(s): PROBNP in the last 8760 hours. HbA1C: No results for input(s): HGBA1C in the last 72 hours. CBG: No results for input(s): GLUCAP in the last 168 hours. Lipid Profile: No results for input(s): CHOL, HDL, LDLCALC, TRIG, CHOLHDL, LDLDIRECT in the last 72 hours. Thyroid Function Tests: No results for input(s): TSH, T4TOTAL, FREET4, T3FREE, THYROIDAB in the last 72 hours. Anemia Panel: No results for input(s): VITAMINB12, FOLATE, FERRITIN, TIBC, IRON, RETICCTPCT in the last 72 hours. Sepsis Labs: No results for input(s): PROCALCITON, LATICACIDVEN in the last 168 hours.  Recent Results (from the past 240 hour(s))  Urine culture     Status: Abnormal   Collection Time: 06/19/16  8:54 AM  Result Value Ref Range Status   Specimen Description URINE, RANDOM  Final   Special Requests NONE  Final   Culture MULTIPLE SPECIES PRESENT, SUGGEST RECOLLECTION (A)  Final   Report Status 06/20/2016 FINAL  Final  Urine culture     Status: None   Collection Time: 06/20/16  7:42 AM  Result Value Ref Range Status   Specimen Description URINE, CLEAN CATCH  Final   Special Requests NONE  Final   Culture NO GROWTH Performed at [facility]   Final   Report Status 06/22/2016 FINAL  Final  Surgical pcr screen     Status: None   Collection Time: 06/25/16  9:33 AM  Result Value Ref Range Status   MRSA, PCR NEGATIVE NEGATIVE Final   Staphylococcus aureus NEGATIVE NEGATIVE Final    Comment:        The Xpert SA Assay (FDA approved for NASAL specimens in patients over 17  years of age), is one component of a comprehensive surveillance program.  Test performance has been validated by [facility] for patients greater than or equal to 68 year old. It is not intended to diagnose infection nor to guide or monitor treatment.          Radiology Studies: No results found.      Scheduled Meds: . bisacodyl  10 mg Rectal Once  . chlorhexidine  15 mL Mouth Rinse BID  . enoxaparin (LOVENOX) injection  40 mg Subcutaneous Q24H  . famotidine (PEPCID) IV  20 mg  Intravenous Q12H  . mouth rinse  15 mL Mouth Rinse q12n4p  . morphine   Intravenous Q4H  . nicotine  14 mg Transdermal Daily  . trifluoperazine  2.5 mg Oral q1800   Continuous Infusions: . sodium chloride 75 mL/hr at 06/27/16 0840     LOS: 8 days    Time spent: 35 minutes.     Debbe Odea, MD [facility] Pager [phone]  If 7PM-7AM, please contact night-coverage [email or web address] Password [email or web address] 06/27/2016, 1:42 PM

## 2016-06-28 ENCOUNTER — Encounter (HOSPITAL_COMMUNITY): Payer: Self-pay | Admitting: Surgery

## 2016-06-28 DIAGNOSIS — E440 Moderate protein-calorie malnutrition: Secondary | ICD-10-CM | POA: Diagnosis present

## 2016-06-28 LAB — BASIC METABOLIC PANEL
Anion gap: 7 (ref 5–15)
BUN: 15 mg/dL (ref 6–20)
CALCIUM: 8.2 mg/dL — AB (ref 8.9–10.3)
CO2: 28 mmol/L (ref 22–32)
CREATININE: 0.47 mg/dL (ref 0.44–1.00)
Chloride: 110 mmol/L (ref 101–111)
GFR calc Af Amer: >60 mL/min (ref >60)
GLUCOSE: 92 mg/dL (ref 65–99)
Potassium: 3.5 mmol/L (ref 3.5–5.1)
SODIUM: 145 mmol/L (ref 135–145)

## 2016-06-28 LAB — CBC
HCT: 35.7 % — ABNORMAL LOW (ref 36.0–46.0)
Hemoglobin: 11.5 g/dL — ABNORMAL LOW (ref 12.0–15.0)
MCH: 28.5 pg (ref 26.0–34.0)
MCHC: 32.2 g/dL (ref 30.0–36.0)
MCV: 88.6 fL (ref 78.0–100.0)
PLATELETS: 342 10*3/uL (ref 150–400)
RBC: 4.03 MIL/uL (ref 3.87–5.11)
RDW: 16.5 % — AB (ref 11.5–15.5)
WBC: 11 10*3/uL — ABNORMAL HIGH (ref 4.0–10.5)

## 2016-06-28 MED ORDER — LACTATED RINGERS IV BOLUS (SEPSIS): 1000.0000 mL | Freq: Three times a day (TID) | INTRAVENOUS | Status: AC | PRN

## 2016-06-28 MED ORDER — BISACODYL 10 MG RE SUPP
10.0000 mg | Freq: Every day | RECTAL | Status: DC
  Administered 2016-06-28 – 2016-07-03 (×5): 10 mg via RECTAL
  Filled 2016-06-28 (×7): qty 1

## 2016-06-28 MED ORDER — PHENOL 1.4 % MT LIQD
2.0000 | OROMUCOSAL | Status: DC | PRN
  Filled 2016-06-28: qty 177

## 2016-06-28 MED ORDER — PROCHLORPERAZINE EDISYLATE 5 MG/ML IJ SOLN: 5.0000 mg | INTRAMUSCULAR | Status: DC | PRN

## 2016-06-28 MED ORDER — LIP MEDEX EX OINT
1.0000 "application " | TOPICAL_OINTMENT | Freq: Two times a day (BID) | CUTANEOUS | Status: DC
  Administered 2016-06-28 – 2016-07-07 (×17): 1 via TOPICAL
  Filled 2016-06-28 (×5): qty 7

## 2016-06-28 MED ORDER — ALUM & MAG HYDROXIDE-SIMETH 200-200-20 MG/5ML PO SUSP
30.0000 mL | Freq: Four times a day (QID) | ORAL | Status: DC | PRN
  Administered 2016-07-07: 30 mL via ORAL
  Filled 2016-06-28 (×2): qty 30

## 2016-06-28 MED ORDER — MAGIC MOUTHWASH
15.0000 mL | Freq: Four times a day (QID) | ORAL | Status: DC | PRN
  Filled 2016-06-28: qty 15

## 2016-06-28 MED ORDER — MENTHOL 3 MG MT LOZG
1.0000 | LOZENGE | OROMUCOSAL | Status: DC | PRN
  Filled 2016-06-28: qty 9

## 2016-06-28 NOTE — Progress Notes (Signed)
Physical Therapy Treatment Patient Details Name: Brenda Gibson MRN: [id number] DOB: 25-Jul-1941 Today's Date: 06/28/2016    History of Present Illness , S/P exploratory lap for SBO on 06/25/16.    PT Comments    Pt OOB on BSC on arrival.  Assisted with hygiene then amb a limited distance in hallway. Pt required 4 lts nasal to achieve 90%> sats.    Follow Up Recommendations  SNF  pt wants to D/C to home in which case will need 24/7 assist   Equipment Recommendations       Recommendations for Other Services       Precautions / Restrictions Precautions Precautions: Fall Precaution Comments: NG, abd. incision Restrictions Weight Bearing Restrictions: No    Mobility  Bed Mobility Overal bed mobility: Needs Assistance         Sit to supine: Min assist;Mod assist   General bed mobility comments: assist B LE up onto bed due to recent ABD surgery  Transfers Overall transfer level: Needs assistance Equipment used: Rolling walker (2 wheeled) Transfers: Sit to/from Stand Sit to Stand: Min assist;Mod assist         General transfer comment: 25% VC's on proper hand placement and increased time to complete turns  Ambulation/Gait Ambulation/Gait assistance: Min assist;+2 safety/equipment Ambulation Distance (Feet): 58 Feet Assistive device: Rolling walker (2 wheeled) Gait Pattern/deviations: Step-through pattern;Decreased stride length;Trunk flexed Gait velocity: decreased   General Gait Details: slow gait with mild c/o dizziness and dyspnea.  "it hurts" to take a deep breath.  Pt required 4 lts nasal during amb to achieve 90% >.     Stairs            Wheelchair Mobility    Modified Rankin (Stroke Patients Only)       Balance                                    Cognition Arousal/Alertness: Awake/alert Behavior During Therapy: WFL for tasks assessed/performed Overall Cognitive Status: Within Functional Limits for tasks assessed                       Exercises      General Comments        Pertinent Vitals/Pain Pain Assessment: Faces Faces Pain Scale: Hurts a little bit Pain Location: ABD Pain Descriptors / Indicators: Constant;Cramping Pain Intervention(s): Monitored during session;Repositioned    Home Living                      Prior Function            PT Goals (current goals can now be found in the care plan section) Progress towards PT goals: Progressing toward goals    Frequency  Min 3X/week    PT Plan Current plan remains appropriate    Co-evaluation             End of Session Equipment Utilized During Treatment: Oxygen Activity Tolerance: Patient tolerated treatment well Patient left: in bed;with call bell/phone within reach;with bed alarm set     Time: 1453-1521 PT Time Calculation (min) (ACUTE ONLY): 28 min  Charges:  $Gait Training: 8-22 mins $Therapeutic Activity: 8-22 mins                    G Codes:      Rica Koyanagi  PTA [facility]  Acute  Rehab Pager  [phone]  

## 2016-06-28 NOTE — Clinical Social Work Note (Signed)
Per RNCM, patient is now agreeable to SNF placement. FL-2 completed and faxed via [place].   MSW remains available as needed.   Glendon Axe, MSW [phone] 06/28/2016 9:55 AM

## 2016-06-28 NOTE — Progress Notes (Signed)
Initial Nutrition Assessment  DOCUMENTATION CODES:   Non-severe (moderate) malnutrition in context of acute illness/injury  INTERVENTION:   Diet advancement per MD If patient's diet unable to be advanced by NPO day 8, recommend nutrition support. RD to continue to monitor for plan  NUTRITION DIAGNOSIS:   Inadequate oral intake related to inability to eat as evidenced by NPO status.  GOAL:   Patient will meet greater than or equal to 90% of their needs  MONITOR:   Diet advancement, Labs, Weight trends, I & O's  REASON FOR ASSESSMENT:   LOS, NPO/Clear Liquid Diet (x 9 days)    ASSESSMENT:   75 y.o. female with medical history significant of no significant past medical history the comes into the hospital for suprapubic pain that started on the day of admission, she related this or carotid on 1 AM the pain is constant nothing makes it better or worse, she relates episodes of vomiting, relates no diarrhea no fevers. 8/25: s/p Procedure(s): EXPLORATORY LAPAROTOMY, lysis of adhesion  Patient with NGT placed, output: 650 ml. Pt reports good appetite and eating well PTA and prior to NPO status. Pt states she feels hungry and thirsty, really craving a coke. Pt c/o tightness in her stomach. Pt has been NPO x 7 days. Surgery plans for clamping trial, if unsuccessful recommend nutrition support.  Pt's weight has increase by 30 lb since admission (weight was 120 lb). She states this is her UBW:120 lb.  Nutrition-Focused physical exam completed. Findings are no fat depletion, moderate muscle depletion, and mild edema.   Labs reviewed. Medications reviewed.  Diet Order:  Diet NPO time specified Except for: Ice Chips  Skin:  Reviewed, no issues  Last BM:  8/18  Height:   Ht Readings from Last 1 Encounters:  06/28/16 5\' 5"  (1.651 m)    Weight:   Wt Readings from Last 1 Encounters:  06/28/16 150 lb (68 kg)    Ideal Body Weight:  56.8 kg  BMI:  Body mass index is 24.96  kg/m.  Estimated Nutritional Needs:   Kcal:  1500-1700  Protein:  65-75g  Fluid:  1.7L/day  EDUCATION NEEDS:   No education needs identified at this time  Clayton Bibles, MS, RD, LDN Pager: [phone] After Hours Pager: [phone]

## 2016-06-28 NOTE — Progress Notes (Signed)
PROGRESS NOTE    Brenda Gibson  [id number] DOB: 29-Jun-1941 DOA: 06/19/2016 PCP: [name], FNP    Brief Narrative: Brenda Gibson is a 75 y.o. female with medical history significant of no significant past medical history the comes into the hospital for abdominal pain that started on the day of admission. CBC was done that shows mild leukocytosis-  CT scan was obtained that shows emphysematous cystitis and thickening of Jujunal wall and mild ascites - UA showed a few bacteria with 3-6 white blood cells. She subsequently develops ileus vs partial SBO and NG tube was placed.    Subjective: Abdominal pain well controlled with PCA. She has been ambulating without issues. Not passing gas. No chest pain. No fever or chills.   Assessment & Plan:   SBO - initially suspected to be enteritis and therefore Rocephin and Flagyl given for treatment - Surgery consulted - SBO protocol revealed contrast in stomach with obstruction in mid to distal small bowel- due to lack of resolution with antibiotics, she underwent ex lap - s/p lysis of adhesions on 8/25  - management per gen surgery   Emphysematous cystitis Lactic acid is less than 2- she is afebrile with a mild leukocytosis, UA showed 6-30 white blood cells with a few bacteria. Received IV Rocephin x 7 days- course ended on 7/25   Urine culture; multiples bacteria. Repeated urine culture 8-20 no growth.   Hypokalemia - repleted - Mg normal  Leukocytosis - treated for enteritis and UTI   Tobacco abuse: Counseling was done - Nicotine patch  HTN - PRN IV hydralazine.   DVT prophylaxis; lovenox Code Status: full code.  Family Communication:care discussed with patient  Disposition Plan: home when stable   Consultants:   gen surgery    Procedures:   none  Antimicrobials:   Ceftriaxone 8-19  Flagyl 8-20     Objective: Vitals:   06/28/16 0346 06/28/16 0625 06/28/16 0800 06/28/16 0817  BP:  (!) 179/80      Pulse:      Resp: 14 13  10   Temp:  97.8 F (36.6 C)    TempSrc:  Oral    SpO2: 94% 94%  91%  Weight:   68 kg (150 lb)   Height:   5\' 5"  (1.651 m)     Intake/Output Summary (Last 24 hours) at 06/28/16 1228 Last data filed at 06/28/16 1058  Gross per 24 hour  Intake              320 ml  Output             1250 ml  Net             -930 ml   Filed Weights   06/26/16 0521 06/27/16 0541 06/28/16 0800  Weight: 64.9 kg (143 lb) 70.7 kg (155 lb 13.8 oz) 68 kg (150 lb)    Examination:  General exam: Appears calm and comfortable  Respiratory system: Clear to auscultation. Respiratory effort normal. Cardiovascular system: S1 & S2 heard, RRR. No JVD, murmurs, rubs, gallops or clicks. No pedal edema. Gastrointestinal system: Abdomen is mild tender, non-distended, no rigidity- no bowel sounds- NG tube draining bilious material Central nervous system: Alert and oriented. No focal neurological deficits. Extremities: Symmetric 5 x 5 power. Skin: No rashes, lesions or ulcers Psychiatry: Judgement and insight appear normal. Mood & affect appropriate.     Data Reviewed: I have personally reviewed following labs and imaging studies  CBC:  Recent Labs Lab  06/22/16 0345 06/23/16 0404 06/24/16 0416 06/26/16 0338 06/28/16 0352  WBC 13.6* 11.7* 7.7 14.5* 11.0*  HGB 13.3 12.8 13.5 11.7* 11.5*  HCT 40.8 38.3 40.8 35.9* 35.7*  MCV 85.5 83.4 85.2 86.1 88.6  PLT 330 300 319 307 [id number]   Basic Metabolic Panel:  Recent Labs Lab 06/23/16 0404 06/24/16 0416 06/25/16 0334 06/26/16 0338 06/28/16 0352  NA 138 141 142 143 145  K 3.1* 3.2* 3.2* 3.7 3.5  CL 108 109 111 112* 110  CO2 22 23 20* 23 28  GLUCOSE 119* 105* 95 113* 92  BUN 19 21* 21* 16 15  CREATININE 0.36* 0.55 0.50 0.48 0.47  CALCIUM 8.9 8.7* 8.3* 8.0* 8.2*  MG  --  2.1  --   --   --    GFR: Estimated Creatinine Clearance: 56.4 mL/min (by C-G formula based on SCr of 0.8 mg/dL). Liver Function Tests: No results for  input(s): AST, ALT, ALKPHOS, BILITOT, PROT, ALBUMIN in the last 168 hours. No results for input(s): LIPASE, AMYLASE in the last 168 hours. No results for input(s): AMMONIA in the last 168 hours. Coagulation Profile: No results for input(s): INR, PROTIME in the last 168 hours. Cardiac Enzymes: No results for input(s): CKTOTAL, CKMB, CKMBINDEX, TROPONINI in the last 168 hours. BNP (last 3 results) No results for input(s): PROBNP in the last 8760 hours. HbA1C: No results for input(s): HGBA1C in the last 72 hours. CBG: No results for input(s): GLUCAP in the last 168 hours. Lipid Profile: No results for input(s): CHOL, HDL, LDLCALC, TRIG, CHOLHDL, LDLDIRECT in the last 72 hours. Thyroid Function Tests: No results for input(s): TSH, T4TOTAL, FREET4, T3FREE, THYROIDAB in the last 72 hours. Anemia Panel: No results for input(s): VITAMINB12, FOLATE, FERRITIN, TIBC, IRON, RETICCTPCT in the last 72 hours. Sepsis Labs: No results for input(s): PROCALCITON, LATICACIDVEN in the last 168 hours.  Recent Results (from the past 240 hour(s))  Urine culture     Status: Abnormal   Collection Time: 06/19/16  8:54 AM  Result Value Ref Range Status   Specimen Description URINE, RANDOM  Final   Special Requests NONE  Final   Culture MULTIPLE SPECIES PRESENT, SUGGEST RECOLLECTION (A)  Final   Report Status 06/20/2016 FINAL  Final  Urine culture     Status: None   Collection Time: 06/20/16  7:42 AM  Result Value Ref Range Status   Specimen Description URINE, CLEAN CATCH  Final   Special Requests NONE  Final   Culture NO GROWTH Performed at [facility]   Final   Report Status 06/22/2016 FINAL  Final  Surgical pcr screen     Status: None   Collection Time: 06/25/16  9:33 AM  Result Value Ref Range Status   MRSA, PCR NEGATIVE NEGATIVE Final   Staphylococcus aureus NEGATIVE NEGATIVE Final    Comment:        The Xpert SA Assay (FDA approved for NASAL specimens in patients over 21 years of  age), is one component of a comprehensive surveillance program.  Test performance has been validated by [facility] for patients greater than or equal to 61 year old. It is not intended to diagnose infection nor to guide or monitor treatment.          Radiology Studies: No results found.      Scheduled Meds: . bisacodyl  10 mg Rectal Daily  . chlorhexidine  15 mL Mouth Rinse BID  . enoxaparin (LOVENOX) injection  40 mg Subcutaneous Q24H  .  famotidine (PEPCID) IV  20 mg Intravenous Q12H  . lip balm  1 application Topical BID  . mouth rinse  15 mL Mouth Rinse q12n4p  . morphine   Intravenous Q4H  . nicotine  14 mg Transdermal Daily   Continuous Infusions: . sodium chloride 75 mL/hr at 06/27/16 2320     LOS: 9 days    Time spent: 35 minutes.     Debbe Odea, MD [facility] Pager [phone]  If 7PM-7AM, please contact night-coverage [email or web address] Password [facility] 06/28/2016, 12:28 PM

## 2016-06-28 NOTE — Progress Notes (Signed)
[facility]  [address] [place], [place] [address] Phone: [phone] FAX: [phone]   Brenda Gibson [id number] 10/03/1941  CARE TEAM:  PCP: [name], [place]  Outpatient Care Team: Patient Care Team: Golden Circle, FNP as PCP - General (Family Medicine) Hurman Horn, MD as Consulting Physician (Ophthalmology)  Inpatient Treatment Team: Treatment Team: Attending Provider: Debbe Odea, MD; Technician: Dayton Scrape, NT; Registered Nurse: Lottie Dawson, RN; Consulting Physician: Nolon Nations, MD; Technician: Ileene Rubens, NT; Rounding Team: Joycelyn Das, MD; Registered Nurse: Nilda Calamity, RN (Inactive); Technician: Suzanna Obey, NT; Registered Nurse: Dolores Patty, RN; Registered Nurse: Waylan Boga, RN; Registered Nurse: Bayard Hugger, RN; Registered Nurse: Don Perking, RN; Physical Therapy Assistant: Diego Cory, PTA  Problem List:   Active Problems:   Emphysematous cystitis   Elevated blood pressure reading without diagnosis of hypertension   SBO (small bowel obstruction) ([place])   Enteritis   3 Days Post-Op 06/25/2016  POSTOPERATIVE DIAGNOSIS:  Small-bowel obstruction.  PROCEDURE: 1. Exploratory laparotomy. 2. Lysis of adhesions.  SURGEON:  Coralie Keens, MD.  FINDINGS:  The patient was found to have a near-closed loop obstruction with near-complete obstruction of the distal ileum.  This was from a single adhesive band.  There was significant bruising of her mesentery and dilation of her small bowel.  There was no other intraabdominal pathology identified.  Assessment  Stabilizing  Plan:  -NGT until flatus, then clamping trial -IVF -consider d/c foley -PCA -VTE prophylaxis- SCDs, etc -mobilize as tolerated to help recovery  Adin Hector, M.D., F.A.C.S. Gastrointestinal and Minimally Invasive Surgery [facility] [address] [place], [place] [address] [phone] Main / Paging   06/28/2016  Subjective:  Less sore NGT helping  Objective:  Vital signs:  Vitals:   06/28/16 0346 06/28/16 0625 06/28/16 0800 06/28/16 0817  BP:  (!) 179/80    Pulse:      Resp: _0 Temp:  97.8 F (36.6 C)    TempSrc:  Oral    SpO2: 94% 94%  91%  Weight:   68 kg (150 lb)   Height:   _1  (1.651 m)     Last BM Date: 06/18/16  Intake/Output   Yesterday:  08/27 0701 - 08/28 0700 In: 120 [NG/GT:120] Out: 1250 [Urine:600; Emesis/NG output:650] This shift:  No intake/output data recorded.  Bowel function:  Flatus: No  BM:  No  Drain: Bilious   Physical Exam:  General: Pt awake/alert/oriented x4 in No acute distress Eyes: PERRL, normal EOM.  Sclera clear.  No icterus Neuro: CN II-XII intact w/o focal sensory/motor deficits. Lymph: No head/neck/groin lymphadenopathy Psych:  No delerium/psychosis/paranoia HENT: Normocephalic, Mucus membranes moist.  No thrush Neck: Supple, No tracheal deviation Chest: No chest wall pain w good excursion CV:  Pulses intact.  Regular rhythm MS: Normal AROM mjr joints.  No obvious deformity Abdomen: Somewhat firm.  Mildy distended.  Mildly tender at incisions only.  Dressing c/d/i.  No evidence of peritonitis.  No incarcerated hernias. Ext:  SCDs BLE.  No mjr edema.  No cyanosis Skin: No petechiae / purpura  Results:   Labs: Results for orders placed or performed during the hospital encounter of 06/19/16 (from the past 48 hour(s))  CBC     Status: Abnormal   Collection Time: 06/28/16  3:52 AM  Result Value Ref Range   WBC 11.0 (H) 4.0 -  10.5 K/uL   RBC 4.03 3.87 - 5.11 MIL/uL   Hemoglobin 11.5 (L) 12.0 - 15.0 g/dL   HCT 35.7 (L) 36.0 - 46.0 %   MCV 88.6 78.0 - 100.0 fL   MCH 28.5 26.0 - 34.0 pg   MCHC 32.2 30.0 - 36.0 g/dL   RDW 16.5 (H) 11.5 - 15.5 %   Platelets 342 150 - 400 K/uL  Basic metabolic panel     Status: Abnormal   Collection Time:  06/28/16  3:52 AM  Result Value Ref Range   Sodium 145 135 - 145 mmol/L   Potassium 3.5 3.5 - 5.1 mmol/L   Chloride 110 101 - 111 mmol/L   CO2 28 22 - 32 mmol/L   Glucose, Bld 92 65 - 99 mg/dL   BUN 15 6 - 20 mg/dL   Creatinine, Ser 0.47 0.44 - 1.00 mg/dL   Calcium 8.2 (L) 8.9 - 10.3 mg/dL   GFR calc non Af Amer >60 >60 mL/min   GFR calc Af Amer >60 >60 mL/min    Comment: (NOTE) The eGFR has been calculated using the CKD EPI equation. This calculation has not been validated in all clinical situations. eGFR's persistently <60 mL/min signify possible Chronic Kidney Disease.    Anion gap 7 5 - 15    Imaging / Studies: No results found.  Medications / Allergies: per chart  Antibiotics: Anti-infectives    Start     Dose/Rate Route Frequency Ordered Stop   06/25/16 0900  ceFAZolin (ANCEF) IVPB 2g/100 mL premix     2 g 200 mL/hr over 30 Minutes Intravenous  Once 06/25/16 0839 06/25/16 1315   06/20/16 1300  metroNIDAZOLE (FLAGYL) IVPB 500 mg  Status:  Discontinued     500 mg 100 mL/hr over 60 Minutes Intravenous Every 8 hours 06/20/16 1203 06/26/16 1454   06/20/16 1000  cefTRIAXone (ROCEPHIN) 1 g in dextrose 5 % 50 mL IVPB  Status:  Discontinued     1 g 100 mL/hr over 30 Minutes Intravenous Every 24 hours 06/19/16 1254 06/26/16 1454   06/19/16 1100  cefTRIAXone (ROCEPHIN) 1 g in dextrose 5 % 50 mL IVPB     1 g 100 mL/hr over 30 Minutes Intravenous  Once 06/19/16 1052 06/19/16 1129        Note: Portions of this report may have been transcribed using voice recognition software. Every effort was made to ensure accuracy; however, inadvertent computerized transcription errors may be present.   Any transcriptional errors that result from this process are unintentional.     Adin Hector, M.D., F.A.C.S. Gastrointestinal and Minimally Invasive Surgery [facility] [address] [place], [place] [address] [phone] Main /  Paging   06/28/2016

## 2016-06-28 NOTE — Progress Notes (Signed)
Foley Catheter removed per MD order. Will continue to monitor

## 2016-06-28 NOTE — NC FL2 (Signed)
[place] LEVEL OF CARE SCREENING TOOL     IDENTIFICATION  Patient Name: Brenda Gibson Birthdate: 05-Oct-1941 Sex: female Admission Date (Current Location): 06/19/2016  [facility] and [place] Number:  Herbalist and Address:  [facility],  [place] [address] [place]      Provider Number: [id number]  Attending Physician Name and Address:  Debbe Odea, MD  Relative Name and Phone Number:       Current Level of Care: Hospital Recommended Level of Care: [place] Prior Approval Number:    Date Approved/Denied:   PASRR Number: [id number]  Discharge Plan: SNF    Current Diagnoses: Patient Active Problem List   Diagnosis Date Noted  . SBO (small bowel obstruction) ([place]) 06/21/2016  . Enteritis 06/21/2016  . 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 Elevated blood pressure reading without diagnosis of hypertension 06/19/2016  . Medicare annual wellness visit, subsequent 02/23/2016  . Dysuria 07/08/2015  . Bladder problem 09/03/2014  . Female bladder prolapse 09/03/2014  . Fatigue 08/12/2014  . Routine general medical examination at a health care facility 08/12/2014  . At risk for osteoporosis/osteopenia 08/12/2014  . Overactive bladder 08/02/2013  . Essential hypertension, benign 06/05/2013  . 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 Pain in left hip 06/05/2013  . Wrist pain 02/22/2013  . Ganglion cyst of wrist 02/22/2013  . Tobacco abuse 02/24/2011    Orientation RESPIRATION BLADDER Height & Weight     Self, Time, Situation, Place  O2 (at 2L) Continent Weight: 150 lb (68 kg) Height:  5\' 5"  (165.1 cm)  BEHAVIORAL SYMPTOMS/MOOD NEUROLOGICAL BOWEL NUTRITION STATUS   (NONE )  (NONE) Continent NG/panda (Currently NPO)  AMBULATORY STATUS COMMUNICATION OF NEEDS Skin   Limited Assist Verbally Surgical wounds (Abdomen)                       Personal Care Assistance Level of Assistance  Bathing, Feeding, Dressing  Bathing Assistance: Limited assistance Feeding assistance: Independent Dressing Assistance: Limited assistance     Functional Limitations Info  Speech, Sight, Hearing Sight Info: Adequate Hearing Info: Adequate Speech Info: Adequate    SPECIAL CARE FACTORS FREQUENCY  PT (By licensed PT)     PT Frequency: 3              Contractures      Additional Factors Info  Code Status, Allergies Code Status Info: FULL CODE  Allergies Info: N/A           Current Medications (06/28/2016):  This is the current hospital active medication list Current Facility-Administered Medications  Medication Dose Route Frequency Provider Last Rate Last Dose  . 0.9 %  sodium chloride infusion   Intravenous Continuous Debbe Odea, MD 75 mL/hr at 06/27/16 2320    . bisacodyl (DULCOLAX) suppository 10 mg  10 mg Rectal Once Coralie Keens, MD      . chlorhexidine (PERIDEX) 0.12 % solution 15 mL  15 mL Mouth Rinse BID Debbe Odea, MD   15 mL at 06/28/16 0933  . diphenhydrAMINE (BENADRYL) injection 12.5 mg  12.5 mg Intravenous Q6H PRN Coralie Keens, MD       Or  . diphenhydrAMINE (BENADRYL) 12.5 MG/5ML elixir 12.5 mg  12.5 mg Oral Q6H PRN Coralie Keens, MD      . enoxaparin (LOVENOX) injection 40 mg  40 mg Subcutaneous Q24H Belkys A Regalado, MD   40 mg at 06/27/16 1214  . famotidine (PEPCID) IVPB 20 mg premix  20 mg Intravenous Q12H Belkys A Regalado, MD   20 mg at 06/28/16 0934  . hydrALAZINE (APRESOLINE) injection 10 mg  10 mg Intravenous Q6H PRN Debbe Odea, MD      . MEDLINE mouth rinse  15 mL Mouth Rinse q12n4p Debbe Odea, MD   15 mL at 06/27/16 1600  . morphine (MORPHINE) 2 mg/mL PCA injection   Intravenous Q4H Coralie Keens, MD   2 mg at 06/26/16 2318  . morphine 2 MG/ML injection 1 mg  1 mg Intravenous Q4H PRN Belkys A Regalado, MD   1 mg at 06/27/16 2321  . naloxone [facility]) injection 0.4 mg  0.4 mg Intravenous PRN Coralie Keens, MD       And  . sodium chloride flush (NS)  0.9 % injection 9 mL  9 mL Intravenous PRN Coralie Keens, MD      . nicotine (NICODERM CQ - dosed in mg/24 hours) patch 14 mg  14 mg Transdermal Daily Fransico Meadow, PA-C   14 mg at 06/28/16 0934  . ondansetron (ZOFRAN) tablet 4 mg  4 mg Oral Q6H PRN Charlynne Cousins, MD   4 mg at 06/21/16 1758   Or  . ondansetron [facility]) injection 4 mg  4 mg Intravenous Q6H PRN Charlynne Cousins, MD   4 mg at 06/25/16 1308  . ondansetron (ZOFRAN) injection 4 mg  4 mg Intravenous Q6H PRN Coralie Keens, MD      . traMADol Veatrice Bourbon) tablet 50 mg  50 mg Oral Q6H PRN Charlynne Cousins, MD   50 mg at 06/22/16 1422  . trifluoperazine (STELAZINE) tablet 2.5 mg  2.5 mg Oral q1800 Belkys A Regalado, MD   2.5 mg at 06/27/16 1624     Discharge Medications: Please see discharge summary for a list of discharge medications.  Relevant Imaging Results:  Relevant Lab Results:   Additional Information SSN [id number]  Glendon Axe A

## 2016-06-28 NOTE — Clinical Social Work Placement (Signed)
   CLINICAL SOCIAL WORK PLACEMENT  NOTE  Date:  06/28/2016  Patient Details  Name: ANNJEANETTE PEHL MRN: [id number] Date of Birth: [birth year]  Clinical Social Work is seeking post-discharge placement for this patient at the [place] level of care (*CSW will initial, date and re-position this form in  chart as items are completed):  Yes   Patient/family provided with [place] Work Department's list of facilities offering this level of care within the geographic area requested by the patient (or if unable, by the patient's family).  Yes   Patient/family informed of their freedom to choose among providers that offer the needed level of care, that participate in Medicare, Medicaid or managed care program needed by the patient, have an available bed and are willing to accept the patient.  Yes   Patient/family informed of [facility]'s ownership interest in [facility] and [facility], as well as of the fact that they are under no obligation to receive care at these facilities.  PASRR submitted to EDS on 06/28/16     PASRR number received on 06/28/16     Existing PASRR number confirmed on       FL2 transmitted to all facilities in geographic area requested by pt/family on 06/28/16     FL2 transmitted to all facilities within larger geographic area on       Patient informed that his/her managed care company has contracts with or will negotiate with certain facilities, including the following:            Patient/family informed of bed offers received.  Patient chooses bed at       Physician recommends and patient chooses bed at      Patient to be transferred to   on  .  Patient to be transferred to facility by       Patient family notified on   of transfer.  Name of family member notified:        PHYSICIAN Please sign FL2     Additional Comment:    _______________________________________________ Glendon Axe A 06/28/2016, 9:54  AM

## 2016-06-28 NOTE — Progress Notes (Signed)
This CM spoke with pt at bedside about going home with [facility] services. Pt states she has decided to go to rehab for a couple of weeks. She has spoken with her son and her HCPOA and they both think it might be best for her. CSW made aware in change of disposition plan. CM will continue to follow. Marney Doctor RN,BSN,NCM [phone]

## 2016-06-29 LAB — BASIC METABOLIC PANEL
ANION GAP: 8 (ref 5–15)
Anion gap: 8 (ref 5–15)
BUN: 11 mg/dL (ref 6–20)
BUN: 9 mg/dL (ref 6–20)
CALCIUM: 8 mg/dL — AB (ref 8.9–10.3)
CHLORIDE: 109 mmol/L (ref 101–111)
CHLORIDE: 109 mmol/L (ref 101–111)
CO2: 27 mmol/L (ref 22–32)
CO2: 25 mmol/L (ref 22–32)
CREATININE: 0.37 mg/dL — AB (ref 0.44–1.00)
Calcium: 7.9 mg/dL — ABNORMAL LOW (ref 8.9–10.3)
Creatinine, Ser: 0.41 mg/dL — ABNORMAL LOW (ref 0.44–1.00)
GFR calc Af Amer: >60 mL/min (ref >60)
GFR calc non Af Amer: >60 mL/min (ref >60)
GLUCOSE: 83 mg/dL (ref 65–99)
GLUCOSE: 99 mg/dL (ref 65–99)
POTASSIUM: 2.8 mmol/L — AB (ref 3.5–5.1)
Potassium: 3.7 mmol/L (ref 3.5–5.1)
Sodium: 144 mmol/L (ref 135–145)
Sodium: 142 mmol/L (ref 135–145)

## 2016-06-29 LAB — MAGNESIUM: Magnesium: 1.6 mg/dL — ABNORMAL LOW (ref 1.7–2.4)

## 2016-06-29 LAB — CBC
HEMATOCRIT: 36.4 % (ref 36.0–46.0)
HEMOGLOBIN: 11.7 g/dL — AB (ref 12.0–15.0)
MCH: 28.4 pg (ref 26.0–34.0)
MCHC: 32.1 g/dL (ref 30.0–36.0)
MCV: 88.3 fL (ref 78.0–100.0)
Platelets: 360 10*3/uL (ref 150–400)
RBC: 4.12 MIL/uL (ref 3.87–5.11)
RDW: 16.3 % — AB (ref 11.5–15.5)
WBC: 10.5 10*3/uL (ref 4.0–10.5)

## 2016-06-29 MED ORDER — MAGNESIUM SULFATE 2 GM/50ML IV SOLN
2.0000 g | Freq: Once | INTRAVENOUS | Status: AC
  Administered 2016-06-29: 2 g via INTRAVENOUS
  Filled 2016-06-29: qty 50

## 2016-06-29 MED ORDER — POTASSIUM CHLORIDE 10 MEQ/100ML IV SOLN
10.0000 meq | INTRAVENOUS | Status: AC
  Administered 2016-06-29 (×2): 10 meq via INTRAVENOUS
  Filled 2016-06-29 (×4): qty 100

## 2016-06-29 MED ORDER — MORPHINE SULFATE (PF) 2 MG/ML IV SOLN
1.0000 mg | INTRAVENOUS | Status: DC | PRN
  Administered 2016-06-29 – 2016-07-02 (×4): 2 mg via INTRAVENOUS
  Administered 2016-07-03: 1 mg via INTRAVENOUS
  Administered 2016-07-04 – 2016-07-05 (×3): 2 mg via INTRAVENOUS
  Filled 2016-06-29 (×10): qty 1

## 2016-06-29 MED ORDER — POTASSIUM CHLORIDE 20 MEQ/15ML (10%) PO SOLN
40.0000 meq | Freq: Once | ORAL | Status: AC
  Administered 2016-06-29: 40 meq via ORAL
  Filled 2016-06-29: qty 30

## 2016-06-29 MED ORDER — POTASSIUM CHLORIDE 10 MEQ/100ML IV SOLN
10.0000 meq | INTRAVENOUS | Status: DC
  Administered 2016-06-29 (×2): 10 meq via INTRAVENOUS
  Filled 2016-06-29 (×7): qty 100

## 2016-06-29 NOTE — Care Management Important Message (Signed)
Important Message  Patient Details  Name: Brenda Gibson MRN: [id number] Date of Birth: 1941/06/02   Medicare Important Message Given:  Yes    [name] 06/29/2016, 9:55 AMImportant Message  Patient Details  Name: Brenda Gibson MRN: [id number] Date of Birth: 1940/11/05   Medicare Important Message Given:  Yes    [name] 06/29/2016, 9:55 AM

## 2016-06-29 NOTE — Progress Notes (Signed)
4 Days Post-Op  Subjective: Nurse says NG clamped since last PM, she thinks she had some flatus.  No BM. NO nausea, Still a little distended.  Site looks fine.  + BS.  Foley out so I'm not sure about urine output.    Objective: Vital signs in last 24 hours: Temp:  [97.9 F (36.6 C)-98.2 F (36.8 C)] 98.1 F (36.7 C) (08/29 0532) Pulse Rate:  [75-83] 83 (08/29 0703) Resp:  [11-19] 15 (08/29 0300) BP: (152-188)/(69-86) 156/69 (08/29 0703) SpO2:  [90 %-99 %] 93 % (08/29 0532) FiO2 (%):  [37 %-39 %] 39 % (08/29 0300) Last BM Date: 06/18/16 1100 IV 400 urine recorded Emesis 275 recorded Afebrile, BP up some K+ 2.8- checking Mag Last film 8/25   Intake/Output from previous day: 08/28 0701 - 08/29 0700 In: 1300 [I.V.:1100; IV Piggyback:200] Out: 675 [Urine:400; Emesis/NG output:275] Intake/Output this shift: No intake/output data recorded.  General appearance: alert, cooperative and no distress GI: soft, still appears somewhat distended, + BS, she reports flatus  Lab Results:   Recent Labs  06/28/16 0352 06/29/16 0405  WBC 11.0* 10.5  HGB 11.5* 11.7*  HCT 35.7* 36.4  PLT 342 360    BMET  Recent Labs  06/28/16 0352 06/29/16 0405  NA 145 144  K 3.5 2.8*  CL 110 109  CO2 28 27  GLUCOSE 92 83  BUN 15 11  CREATININE 0.47 0.41*  CALCIUM 8.2* 7.9*   PT/INR No results for input(s): LABPROT, INR in the last 72 hours.  No results for input(s): AST, ALT, ALKPHOS, BILITOT, PROT, ALBUMIN in the last 168 hours.   Lipase     Component Value Date/Time   LIPASE 24 06/19/2016 0739     Studies/Results: No results found. Prior to Admission medications   Medication Sig Start Date End Date Taking? Authorizing Provider  trifluoperazine (STELAZINE) 5 MG tablet Take 1 tablet by mouth every evening. 06/04/16  Yes Historical Provider, MD    Medications: . bisacodyl  10 mg Rectal Daily  . chlorhexidine  15 mL Mouth Rinse BID  . enoxaparin (LOVENOX) injection  40 mg  Subcutaneous Q24H  . famotidine (PEPCID) IV  20 mg Intravenous Q12H  . lip balm  1 application Topical BID  . mouth rinse  15 mL Mouth Rinse q12n4p  . morphine   Intravenous Q4H  . nicotine  14 mg Transdermal Daily  . potassium chloride  10 mEq Intravenous Q1 Hr x 6   . sodium chloride 125 mL/hr at 06/29/16 0027    Assessment/Plan SBO S/p exploratory laparotomy, with lysis of adhesions, 06/28/16, Dr. 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 If she does well we may pull NG later today.  Mobilize her more and await GI tract function.    LOS: 10 days    Martavia Tye 06/29/2016 [phone]

## 2016-06-29 NOTE — Progress Notes (Signed)
PROGRESS NOTE    Brenda Gibson  [id number] DOB: September 24, 1941 DOA: 06/19/2016 PCP: [name], FNP    Brief Narrative: Brenda Gibson is a 75 y.o. female with medical history significant of no significant past medical history the comes into the hospital for abdominal pain that started on the day of admission. CBC was done that shows mild leukocytosis-  CT scan was obtained that shows emphysematous cystitis and thickening of Jujunal wall and mild ascites - UA showed a few bacteria with 3-6 white blood cells. She subsequently develops ileus vs partial SBO and NG tube was placed.    Subjective:  felt she had the urge to urinate this AM but no one came to help her. Now no longer has the urge and has been told she has 400 cc of Urine in her bladder. No abdominal pain, nausea, vomiting NG (tube is currently clamped).  Assessment & Plan:   SBO - initially suspected to be enteritis and therefore Rocephin and Flagyl given for treatment - Surgery consulted - SBO protocol revealed contrast in stomach with obstruction in mid to distal small bowel- due to lack of resolution with antibiotics, she underwent ex lap - s/p lysis of adhesions on 8/25  - management per gen surgery- starting clears today  Urinary retention?  - due to narcotics?  - 400 cc on bladder scan- follow- if she does not void soon, will do I and O cath and continue to follow for resolution   Emphysematous cystitis Lactic acid is less than 2- she is afebrile with a mild leukocytosis, UA showed 6-30 white blood cells with a few bacteria. Received IV Rocephin x 7 days- course ended on 7/25   Urine culture; multiples bacteria. Repeated urine culture 8-20 no growth.   Hypokalemia/ hypomagnesemia  - replacing daily   Leukocytosis - treated for enteritis and UTI   Tobacco abuse: Counseling was done - Nicotine patch  HTN - PRN IV hydralazine.   DVT prophylaxis; lovenox Code Status: full code.  Family  Communication:care discussed with patient  Disposition Plan: SNF when stable - expect in 2-3 days   Consultants:   gen surgery    Procedures:   none  Antimicrobials:  Anti-infectives    Start     Dose/Rate Route Frequency Ordered Stop   06/25/16 0900  ceFAZolin (ANCEF) IVPB 2g/100 mL premix     2 g 200 mL/hr over 30 Minutes Intravenous  Once 06/25/16 0839 06/25/16 1315   06/20/16 1300  metroNIDAZOLE (FLAGYL) IVPB 500 mg  Status:  Discontinued     500 mg 100 mL/hr over 60 Minutes Intravenous Every 8 hours 06/20/16 1203 06/26/16 1454   06/20/16 1000  cefTRIAXone (ROCEPHIN) 1 g in dextrose 5 % 50 mL IVPB  Status:  Discontinued     1 g 100 mL/hr over 30 Minutes Intravenous Every 24 hours 06/19/16 1254 06/26/16 1454   06/19/16 1100  cefTRIAXone (ROCEPHIN) 1 g in dextrose 5 % 50 mL IVPB     1 g 100 mL/hr over 30 Minutes Intravenous  Once 06/19/16 1052 06/19/16 1129      Objective: Vitals:   06/29/16 0300 06/29/16 0532 06/29/16 0641 06/29/16 0703  BP:  (!) 184/75 (!) 188/86 (!) 156/69  Pulse:  75 75 83  Resp: 15     Temp:  98.1 F (36.7 C)    TempSrc:  Oral    SpO2: 93% 93%    Weight:      Height:  Intake/Output Summary (Last 24 hours) at 06/29/16 1226 Last data filed at 06/29/16 1132  Gross per 24 hour  Intake             1752 ml  Output              675 ml  Net             1077 ml   Filed Weights   06/26/16 0521 06/27/16 0541 06/28/16 0800  Weight: 64.9 kg (143 lb) 70.7 kg (155 lb 13.8 oz) 68 kg (150 lb)    Examination:  General exam: Appears calm and comfortable  Respiratory system: Clear to auscultation. Respiratory effort normal. Cardiovascular system: S1 & S2 heard, RRR. No JVD, murmurs, rubs, gallops or clicks. No pedal edema. Gastrointestinal system: Abdomen is mild tender, non-distended, no rigidity- + bowel sounds- NG tube clamped Central nervous system: Alert and oriented. No focal neurological deficits. Extremities: Symmetric 5 x 5  power. Skin: No rashes, lesions or ulcers Psychiatry: Judgement and insight appear normal. Mood & affect appropriate.     Data Reviewed: I have personally reviewed following labs and imaging studies  CBC:  Recent Labs Lab 06/23/16 0404 06/24/16 0416 06/26/16 0338 06/28/16 0352 06/29/16 0405  WBC 11.7* 7.7 14.5* 11.0* 10.5  HGB 12.8 13.5 11.7* 11.5* 11.7*  HCT 38.3 40.8 35.9* 35.7* 36.4  MCV 83.4 85.2 86.1 88.6 88.3  PLT 300 319 307 342 [id number]   Basic Metabolic Panel:  Recent Labs Lab 06/24/16 0416 06/25/16 0334 06/26/16 0338 06/28/16 0352 06/29/16 0405  NA 141 142 143 145 144  K 3.2* 3.2* 3.7 3.5 2.8*  CL 109 111 112* 110 109  CO2 23 20* 23 28 27   GLUCOSE 105* 95 113* 92 83  BUN 21* 21* 16 15 11   CREATININE 0.55 0.50 0.48 0.47 0.41*  CALCIUM 8.7* 8.3* 8.0* 8.2* 7.9*  MG 2.1  --   --   --  1.6*   GFR: Estimated Creatinine Clearance: 56.4 mL/min (by C-G formula based on SCr of 0.8 mg/dL). Liver Function Tests: No results for input(s): AST, ALT, ALKPHOS, BILITOT, PROT, ALBUMIN in the last 168 hours. No results for input(s): LIPASE, AMYLASE in the last 168 hours. No results for input(s): AMMONIA in the last 168 hours. Coagulation Profile: No results for input(s): INR, PROTIME in the last 168 hours. Cardiac Enzymes: No results for input(s): CKTOTAL, CKMB, CKMBINDEX, TROPONINI in the last 168 hours. BNP (last 3 results) No results for input(s): PROBNP in the last 8760 hours. HbA1C: No results for input(s): HGBA1C in the last 72 hours. CBG: No results for input(s): GLUCAP in the last 168 hours. Lipid Profile: No results for input(s): CHOL, HDL, LDLCALC, TRIG, CHOLHDL, LDLDIRECT in the last 72 hours. Thyroid Function Tests: No results for input(s): TSH, T4TOTAL, FREET4, T3FREE, THYROIDAB in the last 72 hours. Anemia Panel: No results for input(s): VITAMINB12, FOLATE, FERRITIN, TIBC, IRON, RETICCTPCT in the last 72 hours. Sepsis Labs: No results for input(s):  PROCALCITON, LATICACIDVEN in the last 168 hours.  Recent Results (from the past 240 hour(s))  Urine culture     Status: None   Collection Time: 06/20/16  7:42 AM  Result Value Ref Range Status   Specimen Description URINE, CLEAN CATCH  Final   Special Requests NONE  Final   Culture NO GROWTH Performed at [facility]   Final   Report Status 06/22/2016 FINAL  Final  Surgical pcr screen     Status: None  Collection Time: 06/25/16  9:33 AM  Result Value Ref Range Status   MRSA, PCR NEGATIVE NEGATIVE Final   Staphylococcus aureus NEGATIVE NEGATIVE Final    Comment:        The Xpert SA Assay (FDA approved for NASAL specimens in patients over 62 years of age), is one component of a comprehensive surveillance program.  Test performance has been validated by [facility] for patients greater than or equal to 14 year old. It is not intended to diagnose infection nor to guide or monitor treatment.          Radiology Studies: No results found.      Scheduled Meds: . bisacodyl  10 mg Rectal Daily  . chlorhexidine  15 mL Mouth Rinse BID  . enoxaparin (LOVENOX) injection  40 mg Subcutaneous Q24H  . famotidine (PEPCID) IV  20 mg Intravenous Q12H  . lip balm  1 application Topical BID  . magnesium sulfate 1 - 4 g bolus IVPB  2 g Intravenous Once  . mouth rinse  15 mL Mouth Rinse q12n4p  . morphine   Intravenous Q4H  . nicotine  14 mg Transdermal Daily  . potassium chloride  10 mEq Intravenous Q1 Hr x 4  . potassium chloride  40 mEq Oral Once   Continuous Infusions: . sodium chloride 125 mL/hr at 06/29/16 0027     LOS: 10 days    Time spent: 35 minutes.     Debbe Odea, MD [facility] Pager [phone]  If 7PM-7AM, please contact night-coverage [email or web address] Password [facility] 06/29/2016, 12:26 PM

## 2016-06-30 LAB — BASIC METABOLIC PANEL
ANION GAP: 8 (ref 5–15)
BUN: 8 mg/dL (ref 6–20)
CALCIUM: 8.1 mg/dL — AB (ref 8.9–10.3)
CO2: 25 mmol/L (ref 22–32)
CREATININE: 0.49 mg/dL (ref 0.44–1.00)
Chloride: 108 mmol/L (ref 101–111)
Glucose, Bld: 103 mg/dL — ABNORMAL HIGH (ref 65–99)
Potassium: 3.6 mmol/L (ref 3.5–5.1)
SODIUM: 141 mmol/L (ref 135–145)

## 2016-06-30 LAB — MAGNESIUM: MAGNESIUM: 1.9 mg/dL (ref 1.7–2.4)

## 2016-06-30 NOTE — Progress Notes (Signed)
Physical Therapy Treatment Patient Details Name: Brenda Gibson MRN: [id number] DOB: November 23, 1940 Today's Date: 06/30/2016    History of Present Illness , S/P exploratory lap for SBO on 06/25/16.    PT Comments    Assisted OOB to amb a greater distance on 2 lts nasal.  Follow Up Recommendations  SNF     Equipment Recommendations   (has a walker in her garage her spouse use to use)    Recommendations for Other Services       Precautions / Restrictions Precautions Precautions: Fall Precaution Comments: NG, abd. incision, O2 Restrictions Weight Bearing Restrictions: No    Mobility  Bed Mobility Overal bed mobility: Needs Assistance Bed Mobility: Supine to Sit   Sidelying to sit: Min assist       General bed mobility comments: heavy use of rails but demonstartes increased ability to self perform  Transfers Overall transfer level: Needs assistance Equipment used: Rolling walker (2 wheeled) Transfers: Sit to/from Stand Sit to Stand: Min assist;Min guard         General transfer comment: 25% VC's on proper hand placement and increased time to complete turns  Ambulation/Gait Ambulation/Gait assistance: Min assist Ambulation Distance (Feet): 210 Feet (105 feet x 2 one sitting rest break) Assistive device: Rolling walker (2 wheeled) Gait Pattern/deviations: Step-through pattern;Decreased stride length;Trunk flexed Gait velocity: decreased   General Gait Details: tolerated increased distance with decreased c/o dizziness.  Amb twice with one extended sitting rest break.    Stairs            Wheelchair Mobility    Modified Rankin (Stroke Patients Only)       Balance                                    Cognition Arousal/Alertness: Awake/alert Behavior During Therapy: WFL for tasks assessed/performed Overall Cognitive Status: Within Functional Limits for tasks assessed                      Exercises      General Comments         Pertinent Vitals/Pain Pain Assessment: Faces Faces Pain Scale: Hurts a little bit Pain Location: ABD Pain Descriptors / Indicators: Discomfort;Tender Pain Intervention(s): Monitored during session;Repositioned    Home Living                      Prior Function            PT Goals (current goals can now be found in the care plan section) Progress towards PT goals: Progressing toward goals    Frequency  Min 3X/week    PT Plan Current plan remains appropriate    Co-evaluation             End of Session Equipment Utilized During Treatment: Oxygen;Gait belt Activity Tolerance: Patient tolerated treatment well Patient left: in chair;with call bell/phone within reach;with chair alarm set     Time: [id number] PT Time Calculation (min) (ACUTE ONLY): 26 min  Charges:  $Gait Training: 8-22 mins $Therapeutic Activity: 8-22 mins                    G Codes:      Rica Koyanagi  PTA [facility]  Acute  Rehab Pager      [phone]

## 2016-06-30 NOTE — Clinical Social Work Note (Signed)
MSW presented bed offers at bedside. Patient chooses [facility]. Patient stated in the event [facility] does not have beds available at d/c, she will commit to [facility] which is also close to her home.   MSW contacted admissions director of [facility] who reported no beds and extended offer pending availability.Currently [facility] does not anticipate any discharges at this time.  MSW notified [facility] who will plan to admit patient once she is medically stable and in the event [facility] is not available at d/c.  MSW remains available as needed.   Glendon Axe, MSW [phone] 06/30/2016 9:54 AM

## 2016-06-30 NOTE — Progress Notes (Signed)
[facility] Progress Note  Patient: Brenda Gibson [id number]   PCP: [name], FNP DOB: January 04, 1941   DOA: 06/19/2016   DOS: 06/30/2016   Date of Service: the patient was seen and examined on 06/30/2016  Subjective: Patient complain about having some nausea this morning as well as mild abdominal discomfort. Patient was placed back to NG tube suction and had significant output. Not passing gas Nutrition: Back nothing by mouth  Brief hospital course: Pt.admitted on 06/19/2016, with complaint of Abdominal pain, was found to have small bowel obstruction as well as emphysematous cystitis. Finish IV antibiotics for emphysematous cystitis on 06/26/2016. S/P exploratory laparotomy and lysis of adhesion for SBO 06/25/2016. Currently further plan is continue to monitor for improvement in bowel obstruction.  Assessment and Plan: 1. SBO (small bowel obstruction) (HCC) Status post lysis of adhesion with expiratory laparotomy 06/25/2016. NG tube was clamped yesterday. Patient was having episodes of nausea this morning and it was placed back to suction today with significant output. Patient is back nothing by mouth at present. Appreciate surgical consultation. We'll continue to follow the recommendation.  2. UTI, emphysematous cystitis. Completed 7 days of IV Rocephin. Continue to closely monitor.  3. Postop urinary retention. Currently resolved and continue to monitor.  4. Hypertension. Continue when necessary hydralazine.  5. Malnutrition. Patient has been moderately malnourished. The patient will remain nothing by mouth for further more 24 hours will need to discuss regarding other nutritional options.  Pain management: When necessary fentanyl Activity: Consulted physical therapy Bowel regimen: last BM a 06/28/2016 Diet: Nothing by mouth DVT Prophylaxis: subcutaneous Heparin  Advance goals of care discussion: Full code  Family Communication: no family was present at  bedside, at the time of interview.   Disposition:  Discharge to home.  Consultants: general surgery Procedures: s/p exploratory laparotomy  Antibiotics: Anti-infectives    Start     Dose/Rate Route Frequency Ordered Stop   06/25/16 0900  ceFAZolin (ANCEF) IVPB 2g/100 mL premix     2 g 200 mL/hr over 30 Minutes Intravenous  Once 06/25/16 0839 06/25/16 1315   06/20/16 1300  metroNIDAZOLE (FLAGYL) IVPB 500 mg  Status:  Discontinued     500 mg 100 mL/hr over 60 Minutes Intravenous Every 8 hours 06/20/16 1203 06/26/16 1454   06/20/16 1000  cefTRIAXone (ROCEPHIN) 1 g in dextrose 5 % 50 mL IVPB  Status:  Discontinued     1 g 100 mL/hr over 30 Minutes Intravenous Every 24 hours 06/19/16 1254 06/26/16 1454   06/19/16 1100  cefTRIAXone (ROCEPHIN) 1 g in dextrose 5 % 50 mL IVPB     1 g 100 mL/hr over 30 Minutes Intravenous  Once 06/19/16 1052 06/19/16 1129        Intake/Output Summary (Last 24 hours) at 06/30/16 0755 Last data filed at 06/30/16 0600  Gross per 24 hour  Intake             2888 ml  Output              850 ml  Net             2038 ml   Filed Weights   06/27/16 0541 06/28/16 0800 06/30/16 0542  Weight: 70.7 kg (155 lb 13.8 oz) 68 kg (150 lb) 69.1 kg (152 lb 5.4 oz)    Objective: Physical Exam: Vitals:   06/29/16 1230 06/29/16 1355 06/29/16 2129 06/30/16 0542  BP:  (!) 165/95 (!) 177/78 (!) 143/81  Pulse:  89 77 79  Resp: 18 18 18 18   Temp:  98.6 F (37 C) 99.3 F (37.4 C) 98.3 F (36.8 C)  TempSrc:  Oral Oral Oral  SpO2: 96% 95% 96% 95%  Weight:    69.1 kg (152 lb 5.4 oz)  Height:        General: Alert, Awake and Oriented to Time, Place and Person. Appear in moderate distress Eyes: PERRL, Conjunctiva normal ENT: Oral Mucosa clear moist. Neck: no JVD, no Abnormal Mass Or lumps Cardiovascular: S1 and S2 Present, no Murmur, Respiratory: Bilateral Air entry equal and Decreased, Clear to Auscultation, no Crackles, no wheezes Abdomen: Bowel Sound absent, Soft  and no tenderness Skin: no redness, no Rash  Extremities: no Pedal edema, no calf tenderness Neurologic: Grossly no focal neuro deficit. Bilaterally Equal motor strength  Data Reviewed: CBC:  Recent Labs Lab 06/24/16 0416 06/26/16 0338 06/28/16 0352 06/29/16 0405  WBC 7.7 14.5* 11.0* 10.5  HGB 13.5 11.7* 11.5* 11.7*  HCT 40.8 35.9* 35.7* 36.4  MCV 85.2 86.1 88.6 88.3  PLT 319 307 342 [id number]   Basic Metabolic Panel:  Recent Labs Lab 06/24/16 0416 06/25/16 0334 06/26/16 0338 06/28/16 0352 06/29/16 0405 06/29/16 1822 06/30/16 0414  NA 141 142 143 145 144 142  --   K 3.2* 3.2* 3.7 3.5 2.8* 3.7  --   CL 109 111 112* 110 109 109  --   CO2 23 20* 23 28 27 25   --   GLUCOSE 105* 95 113* 92 83 99  --   BUN 21* 21* 16 15 11 9   --   CREATININE 0.55 0.50 0.48 0.47 0.41* 0.37*  --   CALCIUM 8.7* 8.3* 8.0* 8.2* 7.9* 8.0*  --   MG 2.1  --   --   --  1.6*  --  1.9    Liver Function Tests: No results for input(s): AST, ALT, ALKPHOS, BILITOT, PROT, ALBUMIN in the last 168 hours. No results for input(s): LIPASE, AMYLASE in the last 168 hours. No results for input(s): AMMONIA in the last 168 hours. Coagulation Profile: No results for input(s): INR, PROTIME in the last 168 hours. Cardiac Enzymes: No results for input(s): CKTOTAL, CKMB, CKMBINDEX, TROPONINI in the last 168 hours. BNP (last 3 results) No results for input(s): PROBNP in the last 8760 hours.  CBG: No results for input(s): GLUCAP in the last 168 hours.  Studies: No results found.   Scheduled Meds: . bisacodyl  10 mg Rectal Daily  . chlorhexidine  15 mL Mouth Rinse BID  . enoxaparin (LOVENOX) injection  40 mg Subcutaneous Q24H  . famotidine (PEPCID) IV  20 mg Intravenous Q12H  . lip balm  1 application Topical BID  . mouth rinse  15 mL Mouth Rinse q12n4p  . nicotine  14 mg Transdermal Daily   Continuous Infusions: . sodium chloride 125 mL/hr at 06/29/16 2344   PRN Meds: alum & mag hydroxide-simeth,  hydrALAZINE, lactated ringers, magic mouthwash, menthol-cetylpyridinium, morphine injection, ondansetron **OR** ondansetron (ZOFRAN) IV, phenol, prochlorperazine  Time spent: 30 minutes  Author: Berle Mull, MD [facility] Pager: [phone] 06/30/2016 7:55 AM  If 7PM-7AM, please contact night-coverage at [email or web address], password [facility]

## 2016-06-30 NOTE — Progress Notes (Signed)
5 Days Post-Op  Subjective: Pt reports nausea this AM and they gave her something for pain and nausea.  She has a tray of clears in front of her.  Not much flatus, No BM, NG still clamped.  I hooked up the NG and got 200 ml right away and she says she felt better after that.  After I left the room the NG  [place] filled up completely.  Objective: Vital signs in last 24 hours: Temp:  [98.3 F (36.8 C)-99.3 F (37.4 C)] 98.3 F (36.8 C) (08/30 0542) Pulse Rate:  [77-89] 79 (08/30 0542) Resp:  [18] 18 (08/30 0542) BP: (143-177)/(78-95) 143/81 (08/30 0542) SpO2:  [95 %-96 %] 95 % (08/30 0542) Weight:  [69.1 kg (152 lb 5.4 oz)] 69.1 kg (152 lb 5.4 oz) (08/30 0542) Last BM Date: 06/18/16 582 PO recorded IV: 2300 Urine 850 recorded NO BM Afebrile, VSS Labs OK, K+ up to 3.6 mag up to 1.9   Intake/Output from previous day: 08/29 0701 - 08/30 0700 In: 2888 [P.O.:582; I.V.:2306] Out: 850 [Urine:850] Intake/Output this shift: No intake/output data recorded.  General appearance: alert, cooperative and no distress GI: soft, still not much in the way of BS, ? some flatus, no BM, incision looks fine.    Lab Results:   Recent Labs  06/28/16 0352 06/29/16 0405  WBC 11.0* 10.5  HGB 11.5* 11.7*  HCT 35.7* 36.4  PLT 342 360    BMET  Recent Labs  06/29/16 0405 06/29/16 1822  NA 144 142  K 2.8* 3.7  CL 109 109  CO2 27 25  GLUCOSE 83 99  BUN 11 9  CREATININE 0.41* 0.37*  CALCIUM 7.9* 8.0*   PT/INR No results for input(s): LABPROT, INR in the last 72 hours.  No results for input(s): AST, ALT, ALKPHOS, BILITOT, PROT, ALBUMIN in the last 168 hours.   Lipase     Component Value Date/Time   LIPASE 24 06/19/2016 0739     Studies/Results: No results found.  Medications: . bisacodyl  10 mg Rectal Daily  . chlorhexidine  15 mL Mouth Rinse BID  . enoxaparin (LOVENOX) injection  40 mg Subcutaneous Q24H  . famotidine (PEPCID) IV  20 mg Intravenous Q12H  . lip balm  1  application Topical BID  . mouth rinse  15 mL Mouth Rinse q12n4p  . nicotine  14 mg Transdermal Daily   . sodium chloride 125 mL/hr at 06/29/16 2344    Assessment/Plan SBO S/p exploratory laparotomy, with lysis of adhesions, 06/25/16, Dr. 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 Pt says she walked x 1 yesterday AM,  and that was the only time she walked.  NG suction regulator does not show a digital display so we don't know what her suction is at.  She needs to be walked at least 4 times per day.  I talked with floor Nursing staff about the issues.     LOS: 11 days    Kniyah Khun 06/30/2016 [phone]

## 2016-06-30 NOTE — Progress Notes (Signed)
Pt transferred to [facility] room 1538 per order. Pts son present in room and notified of transfer and new location. Report given to Keane Scrape, Therapist, sports. Hortencia Conradi RN

## 2016-07-01 ENCOUNTER — Inpatient Hospital Stay (HOSPITAL_COMMUNITY): Payer: Medicare Other

## 2016-07-01 LAB — CBC WITH DIFFERENTIAL/PLATELET
BASOS ABS: 0 10*3/uL (ref 0.0–0.1)
BASOS PCT: 0 %
Eosinophils Absolute: 0 10*3/uL (ref 0.0–0.7)
Eosinophils Relative: 0 %
HEMATOCRIT: 38 % (ref 36.0–46.0)
HEMOGLOBIN: 12.2 g/dL (ref 12.0–15.0)
Lymphocytes Relative: 11 %
Lymphs Abs: 1.4 10*3/uL (ref 0.7–4.0)
MCH: 28.2 pg (ref 26.0–34.0)
MCHC: 32.1 g/dL (ref 30.0–36.0)
MCV: 87.8 fL (ref 78.0–100.0)
Monocytes Absolute: 1 10*3/uL (ref 0.1–1.0)
Monocytes Relative: 8 %
NEUTROS ABS: 10.1 10*3/uL — AB (ref 1.7–7.7)
NEUTROS PCT: 81 %
Platelets: 365 10*3/uL (ref 150–400)
RBC: 4.33 MIL/uL (ref 3.87–5.11)
RDW: 16.1 % — AB (ref 11.5–15.5)
WBC: 12.5 10*3/uL — AB (ref 4.0–10.5)

## 2016-07-01 LAB — COMPREHENSIVE METABOLIC PANEL
ALBUMIN: 2.1 g/dL — AB (ref 3.5–5.0)
ALT: 15 U/L (ref 14–54)
AST: 14 U/L — AB (ref 15–41)
Alkaline Phosphatase: 33 U/L — ABNORMAL LOW (ref 38–126)
Anion gap: 9 (ref 5–15)
BILIRUBIN TOTAL: 0.9 mg/dL (ref 0.3–1.2)
BUN: 9 mg/dL (ref 6–20)
CO2: 27 mmol/L (ref 22–32)
Calcium: 7.8 mg/dL — ABNORMAL LOW (ref 8.9–10.3)
Chloride: 108 mmol/L (ref 101–111)
Creatinine, Ser: 0.48 mg/dL (ref 0.44–1.00)
GFR calc Af Amer: >60 mL/min (ref >60)
GFR calc non Af Amer: >60 mL/min (ref >60)
GLUCOSE: 92 mg/dL (ref 65–99)
POTASSIUM: 3.1 mmol/L — AB (ref 3.5–5.1)
Sodium: 144 mmol/L (ref 135–145)
TOTAL PROTEIN: 4.3 g/dL — AB (ref 6.5–8.1)

## 2016-07-01 LAB — MAGNESIUM: Magnesium: 1.9 mg/dL (ref 1.7–2.4)

## 2016-07-01 MED ORDER — KCL IN DEXTROSE-NACL 20-5-0.45 MEQ/L-%-% IV SOLN
INTRAVENOUS | Status: DC
  Filled 2016-07-01: qty 1000

## 2016-07-01 MED ORDER — KCL IN DEXTROSE-NACL 40-5-0.9 MEQ/L-%-% IV SOLN
INTRAVENOUS | Status: DC
  Administered 2016-07-01 – 2016-07-05 (×6): via INTRAVENOUS
  Filled 2016-07-01 (×9): qty 1000

## 2016-07-01 NOTE — Progress Notes (Signed)
PT Cancellation Note  Patient Details Name: JAYNAE MASARIK MRN: [id number] DOB: 27-Jan-1941   Cancelled Treatment:    Reason Eval/Treat Not Completed: Medical issues which prohibited therapy (has had a suppository recently. check back later.)   Marcelino Freestone PT [id number]  07/01/2016, 8:51 AM

## 2016-07-01 NOTE — Progress Notes (Signed)
[facility] Progress Note  Patient: Brenda Gibson [id number]   PCP: [name], FNP DOB: 09-Feb-1941   DOA: 06/19/2016   DOS: 07/01/2016   Date of Service: the patient was seen and examined on 07/01/2016  Subjective: Patient denies any nausea, not passing gas but feeling better. Has reoccurrence of urinary retention today. Nutrition: nothing by mouth  Brief hospital course: Pt.admitted on 06/19/2016, with complaint of Abdominal pain, was found to have small bowel obstruction as well as emphysematous cystitis. Finish IV antibiotics for emphysematous cystitis on 06/26/2016. S/P exploratory laparotomy and lysis of adhesion for SBO 06/25/2016. Currently further plan is continue to monitor for improvement in bowel obstruction.  Assessment and Plan: 1. SBO (small bowel obstruction) (HCC) Status post lysis of adhesion with expiratory laparotomy 06/25/2016. NG tube was clamped yesterday. Patient was having episodes of nausea this morning and it was placed back to suction today with significant output. Patient was nothing by mouth on 06/30/2016, today NG tube will be clamped and patient will be on ice chips. IV fluids changed. Appreciate surgical consultation. We'll continue to follow the recommendation.  2. UTI, emphysematous cystitis. Completed 7 days of IV Rocephin. Continue to closely monitor.  3. Postop urinary retention. She has reoccurrence of urinary retention. We will continue in and out catheterization and catheterize with Foley if residual is more than 300. Cannot start the patient on Flomax or similar medication while she is nothing by mouth. We will monitor.  4. Hypertension. Continue when necessary hydralazine.  5. Malnutrition. Patient has been moderately malnourished. The patient will remain nothing by mouth for further more 48hours will need to discuss regarding other nutritional options.  Pain management: When necessary fentanyl Activity: Consulted physical  therapy Bowel regimen: last BM a 06/28/2016 Diet: Nothing by mouth DVT Prophylaxis: subcutaneous Heparin  Advance goals of care discussion: Full code  Family Communication: no family was present at bedside, at the time of interview.   Disposition:  Discharge to home.  Consultants: general surgery Procedures: s/p exploratory laparotomy  Antibiotics: Anti-infectives    Start     Dose/Rate Route Frequency Ordered Stop   06/25/16 0900  ceFAZolin (ANCEF) IVPB 2g/100 mL premix     2 g 200 mL/hr over 30 Minutes Intravenous  Once 06/25/16 0839 06/25/16 1315   06/20/16 1300  metroNIDAZOLE (FLAGYL) IVPB 500 mg  Status:  Discontinued     500 mg 100 mL/hr over 60 Minutes Intravenous Every 8 hours 06/20/16 1203 06/26/16 1454   06/20/16 1000  cefTRIAXone (ROCEPHIN) 1 g in dextrose 5 % 50 mL IVPB  Status:  Discontinued     1 g 100 mL/hr over 30 Minutes Intravenous Every 24 hours 06/19/16 1254 06/26/16 1454   06/19/16 1100  cefTRIAXone (ROCEPHIN) 1 g in dextrose 5 % 50 mL IVPB     1 g 100 mL/hr over 30 Minutes Intravenous  Once 06/19/16 1052 06/19/16 1129        Intake/Output Summary (Last 24 hours) at 07/01/16 1815 Last data filed at 07/01/16 1621  Gross per 24 hour  Intake          2934.58 ml  Output             3180 ml  Net          -245.42 ml   Filed Weights   06/28/16 0800 06/30/16 0542 07/01/16 1411  Weight: 68 kg (150 lb) 69.1 kg (152 lb 5.4 oz) 70.7 kg (155 lb 13.8 oz)    Objective:  Physical Exam: Vitals:   06/30/16 1457 06/30/16 2222 07/01/16 0525 07/01/16 1411  BP: 135/65 138/63 (!) 142/69 (!) 163/73  Pulse: 83 75 71 78  Resp: 16 16 16 14   Temp: 97.4 F (36.3 C) 98.1 F (36.7 C) 98.1 F (36.7 C) 98.7 F (37.1 C)  TempSrc: Oral Oral Oral Oral  SpO2: 97% 96% 98% 97%  Weight:    70.7 kg (155 lb 13.8 oz)  Height:        General: Alert, Awake and Oriented to Time, Place and Person. Appear in moderate distress Eyes: PERRL, Conjunctiva normal ENT: Oral Mucosa  clear moist. Neck: no JVD, no Abnormal Mass Or lumps Cardiovascular: S1 and S2 Present, no Murmur, Respiratory: Bilateral Air entry equal and Decreased, Clear to Auscultation, no Crackles, no wheezes Abdomen: Bowel Sound absent, Soft and no tenderness Skin: no redness, no Rash  Extremities: no Pedal edema, no calf tenderness Neurologic: Grossly no focal neuro deficit. Bilaterally Equal motor strength  Data Reviewed: CBC:  Recent Labs Lab 06/26/16 0338 06/28/16 0352 06/29/16 0405 07/01/16 0605  WBC 14.5* 11.0* 10.5 12.5*  NEUTROABS  --   --   --  10.1*  HGB 11.7* 11.5* 11.7* 12.2  HCT 35.9* 35.7* 36.4 38.0  MCV 86.1 88.6 88.3 87.8  PLT 307 342 360 [id number]   Basic Metabolic Panel:  Recent Labs Lab 06/28/16 0352 06/29/16 0405 06/29/16 1822 06/30/16 0414 07/01/16 0605  NA 145 144 142 141 144  K 3.5 2.8* 3.7 3.6 3.1*  CL 110 109 109 108 108  CO2 28 27 25 25 27   GLUCOSE 92 83 99 103* 92  BUN 15 11 9 8 9   CREATININE 0.47 0.41* 0.37* 0.49 0.48  CALCIUM 8.2* 7.9* 8.0* 8.1* 7.8*  MG  --  1.6*  --  1.9 1.9    Liver Function Tests:  Recent Labs Lab 07/01/16 0605  AST 14*  ALT 15  ALKPHOS 33*  BILITOT 0.9  PROT 4.3*  ALBUMIN 2.1*   No results for input(s): LIPASE, AMYLASE in the last 168 hours. No results for input(s): AMMONIA in the last 168 hours. Coagulation Profile: No results for input(s): INR, PROTIME in the last 168 hours. Cardiac Enzymes: No results for input(s): CKTOTAL, CKMB, CKMBINDEX, TROPONINI in the last 168 hours. BNP (last 3 results) No results for input(s): PROBNP in the last 8760 hours.  CBG: No results for input(s): GLUCAP in the last 168 hours.  Studies: Dg Abd Acute W/chest  Result Date: 07/01/2016 CLINICAL DATA:  Small obstruction, abdominal pain and distention, shortness of breath, weakness EXAM: DG ABDOMEN ACUTE W/ 1V CHEST COMPARISON:  Abdominal radiographs 06/25/2016, chest radiograph 06/19/2016 FINDINGS: Nasogastric tube coiled in  stomach with tip at distal antrum. Normal heart size, mediastinal contours, and pulmonary vascularity. 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 Bibasilar small pleural effusions and atelectasis. Remaining lungs clear. No pneumothorax. Persistent dilatation small bowel loops in the mid abdomen. Largest of these loops measures 5.4 cm diameter, little changed. Upper normal wall thickness of loops in LEFT mid abdomen. Retained contrast in colon. No free intraperitoneal air. Bones demineralized. Surgical clips from laparotomy and lysis adhesions on 06/25/2016. IMPRESSION: COPD changes with bibasilar pleural effusions and atelectasis. Persistent dilatation of small bowel loops in the mid abdomen, with some of the loops in the LEFT mid abdomen demonstrating mild bowel wall thickening. Interval laparotomy. Findings may represent postoperative ileus or persistent obstruction. Electronically Signed   By: Lavonia Dana M.D.   On: 07/01/2016 10:57  Scheduled Meds: . bisacodyl  10 mg Rectal Daily  . chlorhexidine  15 mL Mouth Rinse BID  . enoxaparin (LOVENOX) injection  40 mg Subcutaneous Q24H  . famotidine (PEPCID) IV  20 mg Intravenous Q12H  . lip balm  1 application Topical BID  . mouth rinse  15 mL Mouth Rinse q12n4p  . nicotine  14 mg Transdermal Daily   Continuous Infusions: . dextrose 5 % and 0.9 % NaCl with KCl 40 mEq/L 75 mL/hr at 07/01/16 1016   PRN Meds: alum & mag hydroxide-simeth, hydrALAZINE, magic mouthwash, menthol-cetylpyridinium, morphine injection, ondansetron **OR** ondansetron (ZOFRAN) IV, phenol, prochlorperazine  Time spent: 30 minutes  Author: Berle Mull, MD [facility] Pager: [phone] 07/01/2016 6:15 PM  If 7PM-7AM, please contact night-coverage at [email or web address], password [facility]

## 2016-07-01 NOTE — Progress Notes (Signed)
Patient received in and out cath. Patient cleaned prior to procedure noted to have thick white vaginal discharge. Patient tolerated procedure well. Obtained 500cc of amber colored urine, no odor or sediment present. Will continue to encourage ambulation and voiding trials.

## 2016-07-01 NOTE — Progress Notes (Signed)
Patient received soap suds enema per order. Patient tolerated procedure well. Will monitor for results.

## 2016-07-01 NOTE — Progress Notes (Signed)
6 Days Post-Op  Subjective: She looks fine, in Bathroom trying to have a BM, not really sure if she did.  She says she feels bad, but doesn't look bad.  Not sure about NG drainage.  What is in the NG now looks like ice chips.  No bile.  She has bowel sounds and did walk some yesterday.  No complaints of nausea this AM.  She is having flatus.  Objective: Vital signs in last 24 hours: Temp:  [97.4 F (36.3 C)-98.1 F (36.7 C)] 98.1 F (36.7 C) (08/31 0525) Pulse Rate:  [71-83] 71 (08/31 0525) Resp:  [16] 16 (08/31 0525) BP: (135-142)/(63-69) 142/69 (08/31 0525) SpO2:  [96 %-98 %] 98 % (08/31 0525) Last BM Date: 06/18/16 1900 from NG recorded, but it has 1300 in NG recorded 1900 urine also recorded ?? Afebrile, VSS K+ 3.1 WBC 12.5   NO film Intake/Output from previous day: 08/30 0701 - 08/31 0700 In: 4599.1 [P.O.:60; I.V.:2989.1; NG/GT:1300; IV Piggyback:250] Out: 3800 [Urine:1900; Emesis/NG output:1900] Intake/Output this shift: No intake/output data recorded.  General appearance: alert, cooperative and no distress GI: soft, + BS, and + flatus.  Incision ok, .  Lab Results:   Recent Labs  06/29/16 0405 07/01/16 0605  WBC 10.5 12.5*  HGB 11.7* 12.2  HCT 36.4 38.0  PLT 360 365    BMET  Recent Labs  06/30/16 0414 07/01/16 0605  NA 141 144  K 3.6 3.1*  CL 108 108  CO2 25 27  GLUCOSE 103* 92  BUN 8 9  CREATININE 0.49 0.48  CALCIUM 8.1* 7.8*   PT/INR No results for input(s): LABPROT, INR in the last 72 hours.   Recent Labs Lab 07/01/16 0605  AST 14*  ALT 15  ALKPHOS 33*  BILITOT 0.9  PROT 4.3*  ALBUMIN 2.1*     Lipase     Component Value Date/Time   LIPASE 24 06/19/2016 0739     Studies/Results: No results found.  Medications: . bisacodyl  10 mg Rectal Daily  . chlorhexidine  15 mL Mouth Rinse BID  . enoxaparin (LOVENOX) injection  40 mg Subcutaneous Q24H  . famotidine (PEPCID) IV  20 mg Intravenous Q12H  . lip balm  1 application Topical  BID  . mouth rinse  15 mL Mouth Rinse q12n4p  . nicotine  14 mg Transdermal Daily   . dextrose 5 % and 0.45 % NaCl with KCl 20 mEq/L      Assessment/Plan SBO S/p exploratory laparotomy, with lysis of adhesions, 06/25/16, Dr. Coralie Keens  POD #5 Emphysematous cystitis - 7 days of Rocephin Completed on 05/25/16  Tobacco use Hypertension Hypokalemia - adding additional K+ to IV fluids FEN: Clear liquids 1500 ml fluid restriction/IV fluids - back on suction, and sips of clears from the floor ID: No further antibiotics [id number]   PLan:  Retry clamping trials again today, continue to mobilize her, see how she does.  I have added more K+ to IV fluids.      LOS: 12 days    Mykeal Carrick 07/01/2016 [phone]

## 2016-07-01 NOTE — Progress Notes (Signed)
Patient noted with no output for the first 8 hours of shift. Patient up to bedside commode several times during time frame with no success. Patient bladder scanned for 320 mL. Posey Pronto, [place] notified order placed for catheretization. Note this is the 2nd catheterization for patient for the day.

## 2016-07-02 DIAGNOSIS — K567 Ileus, unspecified: Secondary | ICD-10-CM | POA: Diagnosis not present

## 2016-07-02 DIAGNOSIS — K9189 Other postprocedural complications and disorders of digestive system: Secondary | ICD-10-CM

## 2016-07-02 LAB — BASIC METABOLIC PANEL
ANION GAP: 6 (ref 5–15)
BUN: 6 mg/dL (ref 6–20)
CALCIUM: 7.8 mg/dL — AB (ref 8.9–10.3)
CO2: 31 mmol/L (ref 22–32)
Chloride: 106 mmol/L (ref 101–111)
Creatinine, Ser: 0.41 mg/dL — ABNORMAL LOW (ref 0.44–1.00)
GFR calc Af Amer: >60 mL/min (ref >60)
GFR calc non Af Amer: >60 mL/min (ref >60)
GLUCOSE: 162 mg/dL — AB (ref 65–99)
POTASSIUM: 3.5 mmol/L (ref 3.5–5.1)
Sodium: 143 mmol/L (ref 135–145)

## 2016-07-02 LAB — CBC
HEMATOCRIT: 39 % (ref 36.0–46.0)
HEMOGLOBIN: 12.5 g/dL (ref 12.0–15.0)
MCH: 27.9 pg (ref 26.0–34.0)
MCHC: 32.1 g/dL (ref 30.0–36.0)
MCV: 87.1 fL (ref 78.0–100.0)
Platelets: 409 10*3/uL — ABNORMAL HIGH (ref 150–400)
RBC: 4.48 MIL/uL (ref 3.87–5.11)
RDW: 15.8 % — ABNORMAL HIGH (ref 11.5–15.5)
WBC: 13.8 10*3/uL — ABNORMAL HIGH (ref 4.0–10.5)

## 2016-07-02 MED ORDER — SORBITOL 70 % SOLN
960.0000 mL | TOPICAL_OIL | Freq: Once | ORAL | Status: AC
  Administered 2016-07-02: 960 mL via RECTAL
  Filled 2016-07-02: qty 240

## 2016-07-02 MED ORDER — SORBITOL 70 % SOLN
960.0000 mL | TOPICAL_OIL | Freq: Once | ORAL | Status: DC
  Filled 2016-07-02: qty 240

## 2016-07-02 NOTE — Progress Notes (Signed)
Initial Nutrition Assessment  DOCUMENTATION CODES:   Non-severe (moderate) malnutrition in context of acute illness/injury  INTERVENTION:  Diet advancement per MD RD to continue to monitor for plan, Nutrition Support  NUTRITION DIAGNOSIS:   Inadequate oral intake related to inability to eat as evidenced by NPO status. -resolving  GOAL:   Patient will meet greater than or equal to 90% of their needs -not meeting  MONITOR:   Diet advancement, Labs, Weight trends, I & O's  REASON FOR ASSESSMENT:   LOS, NPO/Clear Liquid Diet (x 9 days)    ASSESSMENT:   75 y.o. female with medical history significant of no significant past medical history the comes into the hospital for suprapubic pain that started on the day of admission, she related this or carotid on 1 AM the pain is constant nothing makes it better or worse, she relates episodes of vomiting, relates no diarrhea no fevers.  Pt is tolerating Sips of clears today, undergoing clamping trials of NGT. Per Dr. Johney Maine will attempt to clamp 1 more day, if pt is unable to tolerate, will begin TPN. Ileus seems to be resolving, did have some stool with enemas. NG output up some with clears.  Diet Order:  Diet NPO time specified Except for: Ice Chips, Sips with Meds, Other (See Comments)  Skin:  Reviewed, no issues  Last BM:  8/18  Height:   Ht Readings from Last 1 Encounters:  06/28/16 5\' 5"  (1.651 m)    Weight:   Wt Readings from Last 1 Encounters:  07/02/16 154 lb 1.6 oz (69.9 kg)    Ideal Body Weight:  56.8 kg  BMI:  Body mass index is 25.64 kg/m.  Estimated Nutritional Needs:   Kcal:  1500-1700  Protein:  65-75g  Fluid:  1.7L/day  EDUCATION NEEDS:   No education needs identified at this time  Satira Anis. Lendell Gallick, MS, RD LDN Inpatient Clinical Dietitian Pager [phone]

## 2016-07-02 NOTE — Progress Notes (Signed)
Physical Therapy Treatment Patient Details Name: Brenda Gibson MRN: [id number] DOB: 12/22/1940 Today's Date: 07/02/2016    History of Present Illness , S/P exploratory lap for SBO on 06/25/16.    PT Comments    Had NT clamp NG tube then assisted OOB to amb.  Decreased amb distance with increased c/o fatigue (low energy) and increased c/o B LE edma (heavy).    Follow Up Recommendations  SNF     Equipment Recommendations       Recommendations for Other Services       Precautions / Restrictions Precautions Precautions: Fall Precaution Comments: NG, abd. incision, O2    Mobility  Bed Mobility Overal bed mobility: Needs Assistance Bed Mobility: Supine to Sit;Sit to Supine     Supine to sit: Min assist Sit to supine: Mod assist   General bed mobility comments: assisted OOB and back into bed  Transfers                    Ambulation/Gait Ambulation/Gait assistance: Min assist Ambulation Distance (Feet): 85 Feet Assistive device: Rolling walker (2 wheeled) Gait Pattern/deviations: Step-through pattern;Decreased stride length Gait velocity: decreased   General Gait Details: decreased amb distance due to increased c/o fatigue (low energy) and increased B LE edema "heavy".    Stairs            Wheelchair Mobility    Modified Rankin (Stroke Patients Only)       Balance                                    Cognition Arousal/Alertness: Awake/alert Behavior During Therapy: WFL for tasks assessed/performed Overall Cognitive Status: Within Functional Limits for tasks assessed                      Exercises      General Comments        Pertinent Vitals/Pain Pain Assessment: No/denies pain    Home Living                      Prior Function            PT Goals (current goals can now be found in the care plan section) Progress towards PT goals: Progressing toward goals    Frequency  Min 3X/week    PT Plan  Current plan remains appropriate    Co-evaluation             End of Session Equipment Utilized During Treatment: Gait belt Activity Tolerance: Patient tolerated treatment well Patient left: in bed;with call bell/phone within reach;with bed alarm set     Time: 0937-1006 PT Time Calculation (min) (ACUTE ONLY): 29 min  Charges:  $Gait Training: 8-22 mins $Therapeutic Activity: 8-22 mins                    G Codes:      Rica Koyanagi  PTA [facility]  Acute  Rehab Pager      [phone]

## 2016-07-02 NOTE — Clinical Social Work Note (Signed)
Patient prefers [facility], currently [facility] does not have any available beds.   In the event [facility] remains unavailable at [facility], patient's second preference is [facility]. Facility notified and following patient for dc.   Patient with NG tube, foley and CT scheduled for 9/1. Patient currently not stable for dc.   MSW remains available as needed.   Brenda Gibson, MSW [phone] 07/02/2016 10:15 AM

## 2016-07-02 NOTE — Progress Notes (Signed)
7 Days Post-Op  Subjective: NG suction back on. She had small bowel movement, which was mostly soapsuds after the enema. She's had a little bit of flatus. She is not distended, not tender, he has bowel sounds. She does not look like someone with a prolonged postop ileus. Patient reports she has not been ambulating. She is not on further bathroom, because she was so tired.  Objective: Vital signs in last 24 hours: Temp:  [98 F (36.7 C)-98.7 F (37.1 C)] 98.7 F (37.1 C) (09/01 0500) Pulse Rate:  [74-78] 74 (09/01 0500) Resp:  [14-16] 16 (09/01 0500) BP: (153-163)/(69-73) 156/73 (09/01 0500) SpO2:  [97 %-99 %] 99 % (09/01 0500) Weight:  [69.9 kg (154 lb 1.6 oz)-70.7 kg (155 lb 13.8 oz)] 69.9 kg (154 lb 1.6 oz) (09/01 0500) Last BM Date: 07/01/16 720 PO NG 240 irrigation 1950 urine, 1480 per NG Bm x 1 Afebrile vital signs are stable WBC is up to 13.8, H/H is stable, electrolytes are normal. Intake/Output from previous day: 08/31 0701 - 09/01 0700 In: 2515 [P.O.:720; I.V.:1555; NG/GT:240] Out: [id number] [Urine:1950; Emesis/NG output:1480] Intake/Output this shift: No intake/output data recorded.  General appearance: alert, cooperative and no distress GI: soft, non-tender; bowel sounds normal; no masses,  no organomegaly and not distended.  Lab Results:   Recent Labs  07/01/16 0605 07/02/16 0431  WBC 12.5* 13.8*  HGB 12.2 12.5  HCT 38.0 39.0  PLT 365 409*    BMET  Recent Labs  07/01/16 0605 07/02/16 0431  NA 144 143  K 3.1* 3.5  CL 108 106  CO2 27 31  GLUCOSE 92 162*  BUN 9 6  CREATININE 0.48 0.41*  CALCIUM 7.8* 7.8*   PT/INR No results for input(s): LABPROT, INR in the last 72 hours.   Recent Labs Lab 07/01/16 0605  AST 14*  ALT 15  ALKPHOS 33*  BILITOT 0.9  PROT 4.3*  ALBUMIN 2.1*     Lipase     Component Value Date/Time   LIPASE 24 06/19/2016 0739     Studies/Results: Dg Abd Acute W/chest  Result Date: 07/01/2016 CLINICAL DATA:  Small  obstruction, abdominal pain and distention, shortness of breath, weakness EXAM: DG ABDOMEN ACUTE W/ 1V CHEST COMPARISON:  Abdominal radiographs 06/25/2016, chest radiograph 06/19/2016 FINDINGS: Nasogastric tube coiled in stomach with tip at distal antrum. Normal heart size, mediastinal contours, and pulmonary vascularity. 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 Bibasilar small pleural effusions and atelectasis. Remaining lungs clear. No pneumothorax. Persistent dilatation small bowel loops in the mid abdomen. Largest of these loops measures 5.4 cm diameter, little changed. Upper normal wall thickness of loops in LEFT mid abdomen. Retained contrast in colon. No free intraperitoneal air. Bones demineralized. Surgical clips from laparotomy and lysis adhesions on 06/25/2016. IMPRESSION: COPD changes with bibasilar pleural effusions and atelectasis. Persistent dilatation of small bowel loops in the mid abdomen, with some of the loops in the LEFT mid abdomen demonstrating mild bowel wall thickening. Interval laparotomy. Findings may represent postoperative ileus or persistent obstruction. Electronically Signed   By: Lavonia Dana M.D.   On: 07/01/2016 10:57    Medications: . bisacodyl  10 mg Rectal Daily  . chlorhexidine  15 mL Mouth Rinse BID  . enoxaparin (LOVENOX) injection  40 mg Subcutaneous Q24H  . famotidine (PEPCID) IV  20 mg Intravenous Q12H  . lip balm  1 application Topical BID  . mouth rinse  15 mL Mouth Rinse q12n4p  . nicotine  14 mg Transdermal Daily   .  dextrose 5 % and 0.9 % NaCl with KCl 40 mEq/L 75 mL/hr at 07/01/16 2308    Assessment/Plan SBO S/p exploratory laparotomy, with lysis of adhesions, 06/25/16, Dr. 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  Recheck labs in a.m. I have ordered a prealbumin. If she does not open by tomorrow we may have to go forward with CT scan. She is also going to need a PICC line and TNA if she does not open soon. She has been NPO since 06/21/16. I will put a second order and to ambulate her. Recheck labs again in a.m.      LOS: 13 days    Camerin Ladouceur 07/02/2016 [phone]

## 2016-07-02 NOTE — Progress Notes (Signed)
[facility] Progress Note  Patient: Brenda Gibson [id number]   PCP: [name], FNP DOB: May 11, 1941   DOA: 06/19/2016   DOS: 07/02/2016   Date of Service: the patient was seen and examined on 07/02/2016  Subjective: Patient denies any acute complaint. No nausea no vomiting. Did have a small bowel movement as well. Nutrition: nothing by mouth  Brief hospital course: Pt.admitted on 06/19/2016, with complaint of Abdominal pain, was found to have small bowel obstruction as well as emphysematous cystitis. Finish IV antibiotics for emphysematous cystitis on 06/26/2016. S/P exploratory laparotomy and lysis of adhesion for SBO 06/25/2016. Currently further plan is continue to monitor for improvement in bowel obstruction.  Assessment and Plan: 1. SBO (small bowel obstruction) (HCC) Status post lysis of adhesion with expiratory laparotomy 06/25/2016. NG tube was clamped yesterday. Patient was having episodes of nausea this morning and it was placed back to suction today with significant output. Patient was nothing by mouth on 06/30/2016 With monitor the patient closely with the diet and if no improvement patient may require a CT scan to rule out any acute abnormality. Appreciate surgical consultation. We'll continue to follow the recommendation.  2. UTI, emphysematous cystitis. Completed 7 days of IV Rocephin. Continue to closely monitor.  3. Postop urinary retention. She has reoccurrence of urinary retention. Continue Foley catheter at present. Cannot start the patient on Flomax or similar medication while she is nothing by mouth. We will monitor.  4. Hypertension. Continue when necessary hydralazine.  5. Malnutrition. Patient has been moderately malnourished. Will need TPN should the patient need to remain nothing by mouth beyond 07/03/2016.  Pain management: When necessary fentanyl Activity: Consulted physical therapy Bowel regimen: last BM a 07/02/2016 Diet: Nothing by  mouth DVT Prophylaxis: subcutaneous Heparin  Advance goals of care discussion: Full code  Family Communication: no family was present at bedside, at the time of interview.   Disposition:  Discharge to home.  Consultants: general surgery Procedures: s/p exploratory laparotomy  Antibiotics: Anti-infectives    Start     Dose/Rate Route Frequency Ordered Stop   06/25/16 0900  ceFAZolin (ANCEF) IVPB 2g/100 mL premix     2 g 200 mL/hr over 30 Minutes Intravenous  Once 06/25/16 0839 06/25/16 1315   06/20/16 1300  metroNIDAZOLE (FLAGYL) IVPB 500 mg  Status:  Discontinued     500 mg 100 mL/hr over 60 Minutes Intravenous Every 8 hours 06/20/16 1203 06/26/16 1454   06/20/16 1000  cefTRIAXone (ROCEPHIN) 1 g in dextrose 5 % 50 mL IVPB  Status:  Discontinued     1 g 100 mL/hr over 30 Minutes Intravenous Every 24 hours 06/19/16 1254 06/26/16 1454   06/19/16 1100  cefTRIAXone (ROCEPHIN) 1 g in dextrose 5 % 50 mL IVPB     1 g 100 mL/hr over 30 Minutes Intravenous  Once 06/19/16 1052 06/19/16 1129        Intake/Output Summary (Last 24 hours) at 07/02/16 1827 Last data filed at 07/02/16 1705  Gross per 24 hour  Intake           2592.5 ml  Output             2946 ml  Net           -353.5 ml   Filed Weights   06/30/16 0542 07/01/16 1411 07/02/16 0500  Weight: 69.1 kg (152 lb 5.4 oz) 70.7 kg (155 lb 13.8 oz) 69.9 kg (154 lb 1.6 oz)    Objective: Physical Exam: Vitals:  07/01/16 1411 07/01/16 2210 07/02/16 0500 07/02/16 1355  BP: (!) 163/73 (!) 153/69 (!) 156/73 (!) 159/72  Pulse: 78 77 74 82  Resp: 14 16 16 16   Temp: 98.7 F (37.1 C) 98 F (36.7 C) 98.7 F (37.1 C) 98.7 F (37.1 C)  TempSrc: Oral Oral Oral Oral  SpO2: 97% 97% 99% 99%  Weight: 70.7 kg (155 lb 13.8 oz)  69.9 kg (154 lb 1.6 oz)   Height:        General: Alert, Awake and Oriented to Time, Place and Person. Appear in moderate distress Eyes: PERRL, Conjunctiva normal ENT: Oral Mucosa clear moist. Neck: no JVD,  no Abnormal Mass Or lumps Cardiovascular: S1 and S2 Present, no Murmur, Respiratory: Bilateral Air entry equal and Decreased, Clear to Auscultation, no Crackles, no wheezes Abdomen: Bowel Sound absent, Soft and no tenderness Skin: no redness, no Rash  Extremities: no Pedal edema, no calf tenderness Neurologic: Grossly no focal neuro deficit. Bilaterally Equal motor strength  Data Reviewed: CBC:  Recent Labs Lab 06/26/16 0338 06/28/16 0352 06/29/16 0405 07/01/16 0605 07/02/16 0431  WBC 14.5* 11.0* 10.5 12.5* 13.8*  NEUTROABS  --   --   --  10.1*  --   HGB 11.7* 11.5* 11.7* 12.2 12.5  HCT 35.9* 35.7* 36.4 38.0 39.0  MCV 86.1 88.6 88.3 87.8 87.1  PLT 307 342 360 365 [id number]   Basic Metabolic Panel:  Recent Labs Lab 06/29/16 0405 06/29/16 1822 06/30/16 0414 07/01/16 0605 07/02/16 0431  NA 144 142 141 144 143  K 2.8* 3.7 3.6 3.1* 3.5  CL 109 109 108 108 106  CO2 27 25 25 27 31   GLUCOSE 83 99 103* 92 162*  BUN 11 9 8 9 6   CREATININE 0.41* 0.37* 0.49 0.48 0.41*  CALCIUM 7.9* 8.0* 8.1* 7.8* 7.8*  MG 1.6*  --  1.9 1.9  --     Liver Function Tests:  Recent Labs Lab 07/01/16 0605  AST 14*  ALT 15  ALKPHOS 33*  BILITOT 0.9  PROT 4.3*  ALBUMIN 2.1*   No results for input(s): LIPASE, AMYLASE in the last 168 hours. No results for input(s): AMMONIA in the last 168 hours. Coagulation Profile: No results for input(s): INR, PROTIME in the last 168 hours. Cardiac Enzymes: No results for input(s): CKTOTAL, CKMB, CKMBINDEX, TROPONINI in the last 168 hours. BNP (last 3 results) No results for input(s): PROBNP in the last 8760 hours.  CBG: No results for input(s): GLUCAP in the last 168 hours.  Studies: No results found.   Scheduled Meds: . bisacodyl  10 mg Rectal Daily  . chlorhexidine  15 mL Mouth Rinse BID  . enoxaparin (LOVENOX) injection  40 mg Subcutaneous Q24H  . famotidine (PEPCID) IV  20 mg Intravenous Q12H  . lip balm  1 application Topical BID  . mouth  rinse  15 mL Mouth Rinse q12n4p  . nicotine  14 mg Transdermal Daily  . sorbitol, milk of mag, mineral oil, glycerin (SMOG) enema  960 mL Rectal Once   Continuous Infusions: . dextrose 5 % and 0.9 % NaCl with KCl 40 mEq/L 75 mL/hr at 07/02/16 1621   PRN Meds: alum & mag hydroxide-simeth, hydrALAZINE, magic mouthwash, menthol-cetylpyridinium, morphine injection, ondansetron **OR** ondansetron (ZOFRAN) IV, phenol, prochlorperazine  Time spent: 30 minutes  Author: Berle Mull, MD [facility] Pager: [phone] 07/02/2016 6:27 PM  If 7PM-7AM, please contact night-coverage at [email or web address], password [facility]

## 2016-07-03 LAB — CBC WITH DIFFERENTIAL/PLATELET
BASOS PCT: 0 %
Basophils Absolute: 0 10*3/uL (ref 0.0–0.1)
Eosinophils Absolute: 0 10*3/uL (ref 0.0–0.7)
Eosinophils Relative: 0 %
HEMATOCRIT: 37.3 % (ref 36.0–46.0)
Hemoglobin: 11.9 g/dL — ABNORMAL LOW (ref 12.0–15.0)
Lymphocytes Relative: 10 %
Lymphs Abs: 1.2 10*3/uL (ref 0.7–4.0)
MCH: 27.9 pg (ref 26.0–34.0)
MCHC: 31.9 g/dL (ref 30.0–36.0)
MCV: 87.6 fL (ref 78.0–100.0)
MONO ABS: 0.9 10*3/uL (ref 0.1–1.0)
MONOS PCT: 8 %
NEUTROS ABS: 9 10*3/uL — AB (ref 1.7–7.7)
Neutrophils Relative %: 82 %
Platelets: 370 10*3/uL (ref 150–400)
RBC: 4.26 MIL/uL (ref 3.87–5.11)
RDW: 16 % — AB (ref 11.5–15.5)
WBC: 11.1 10*3/uL — ABNORMAL HIGH (ref 4.0–10.5)

## 2016-07-03 LAB — COMPREHENSIVE METABOLIC PANEL
ALBUMIN: 2.2 g/dL — AB (ref 3.5–5.0)
ALT: 19 U/L (ref 14–54)
ANION GAP: 3 — AB (ref 5–15)
AST: 21 U/L (ref 15–41)
Alkaline Phosphatase: 41 U/L (ref 38–126)
BILIRUBIN TOTAL: 0.4 mg/dL (ref 0.3–1.2)
BUN: <5 mg/dL — ABNORMAL LOW (ref 6–20)
CO2: 35 mmol/L — ABNORMAL HIGH (ref 22–32)
Calcium: 7.8 mg/dL — ABNORMAL LOW (ref 8.9–10.3)
Chloride: 105 mmol/L (ref 101–111)
Creatinine, Ser: 0.4 mg/dL — ABNORMAL LOW (ref 0.44–1.00)
GFR calc Af Amer: >60 mL/min (ref >60)
Glucose, Bld: 118 mg/dL — ABNORMAL HIGH (ref 65–99)
POTASSIUM: 2.9 mmol/L — AB (ref 3.5–5.1)
Sodium: 143 mmol/L (ref 135–145)
TOTAL PROTEIN: 4.3 g/dL — AB (ref 6.5–8.1)

## 2016-07-03 LAB — BASIC METABOLIC PANEL
ANION GAP: 5 (ref 5–15)
BUN: <5 mg/dL — ABNORMAL LOW (ref 6–20)
CALCIUM: 7.8 mg/dL — AB (ref 8.9–10.3)
CO2: 31 mmol/L (ref 22–32)
Chloride: 104 mmol/L (ref 101–111)
Creatinine, Ser: 0.4 mg/dL — ABNORMAL LOW (ref 0.44–1.00)
Glucose, Bld: 120 mg/dL — ABNORMAL HIGH (ref 65–99)
Potassium: 3.9 mmol/L (ref 3.5–5.1)
SODIUM: 140 mmol/L (ref 135–145)

## 2016-07-03 LAB — MAGNESIUM: MAGNESIUM: 1.6 mg/dL — AB (ref 1.7–2.4)

## 2016-07-03 LAB — PREALBUMIN: Prealbumin: 9.2 mg/dL — ABNORMAL LOW (ref 18–38)

## 2016-07-03 MED ORDER — POTASSIUM CHLORIDE 10 MEQ/100ML IV SOLN
INTRAVENOUS | Status: AC
  Administered 2016-07-03: 10 meq via INTRAVENOUS
  Filled 2016-07-03: qty 100

## 2016-07-03 MED ORDER — MAGNESIUM SULFATE 2 GM/50ML IV SOLN
2.0000 g | Freq: Once | INTRAVENOUS | Status: AC
  Administered 2016-07-03: 2 g via INTRAVENOUS
  Filled 2016-07-03: qty 50

## 2016-07-03 MED ORDER — POTASSIUM CHLORIDE 10 MEQ/100ML IV SOLN
10.0000 meq | INTRAVENOUS | Status: AC
  Administered 2016-07-03 (×4): 10 meq via INTRAVENOUS
  Filled 2016-07-03 (×2): qty 100

## 2016-07-03 NOTE — Progress Notes (Signed)
Patient ID: Brenda Gibson, female   DOB: 1941/08/14, 75 y.o.   MRN: [id number] 8 Days Post-Op  Subjective: Continuing to pass gas and had BM yesterday.  No n/v.  NGT clamped.    Objective: Vital signs in last 24 hours: Temp:  [98.2 F (36.8 C)-98.7 F (37.1 C)] 98.2 F (36.8 C) (09/02 0522) Pulse Rate:  [82] 82 (09/02 0522) Resp:  [16-20] 20 (09/02 0522) BP: (145-159)/(72-82) 154/76 (09/02 0522) SpO2:  [98 %-99 %] 98 % (09/02 0522) Weight:  [65 kg (143 lb 4.8 oz)] 65 kg (143 lb 4.8 oz) (09/02 0500) Last BM Date: 07/02/16 720 PO NG 240 irrigation 1950 urine, 1480 per NG Bm x 1 Afebrile vital signs are stable WBC is up to 13.8, H/H is stable, electrolytes are normal. Intake/Output from previous day: 09/01 0701 - 09/02 0700 In: 1735 [P.O.:60; I.V.:1575; IV Piggyback:100] Out: 1806 [Urine:1105; Emesis/NG output:700; Stool:1] Intake/Output this shift: No intake/output data recorded.  General appearance: alert, cooperative and no distress GI: soft, non-tender; and not distended.  Lab Results:   Recent Labs  07/02/16 0431 07/03/16 0659  WBC 13.8* 11.1*  HGB 12.5 11.9*  HCT 39.0 37.3  PLT 409* 370    BMET  Recent Labs  07/01/16 0605 07/02/16 0431  NA 144 143  K 3.1* 3.5  CL 108 106  CO2 27 31  GLUCOSE 92 162*  BUN 9 6  CREATININE 0.48 0.41*  CALCIUM 7.8* 7.8*   PT/INR No results for input(s): LABPROT, INR in the last 72 hours.   Recent Labs Lab 07/01/16 0605  AST 14*  ALT 15  ALKPHOS 33*  BILITOT 0.9  PROT 4.3*  ALBUMIN 2.1*     Lipase     Component Value Date/Time   LIPASE 24 06/19/2016 0739     Studies/Results: Dg Abd Acute W/chest  Result Date: 07/01/2016 CLINICAL DATA:  Small obstruction, abdominal pain and distention, shortness of breath, weakness EXAM: DG ABDOMEN ACUTE W/ 1V CHEST COMPARISON:  Abdominal radiographs 06/25/2016, chest radiograph 06/19/2016 FINDINGS: Nasogastric tube coiled in stomach with tip at distal antrum. Normal  heart size, mediastinal contours, and pulmonary vascularity. 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 Bibasilar small pleural effusions and atelectasis. Remaining lungs clear. No pneumothorax. Persistent dilatation small bowel loops in the mid abdomen. Largest of these loops measures 5.4 cm diameter, little changed. Upper normal wall thickness of loops in LEFT mid abdomen. Retained contrast in colon. No free intraperitoneal air. Bones demineralized. Surgical clips from laparotomy and lysis adhesions on 06/25/2016. IMPRESSION: COPD changes with bibasilar pleural effusions and atelectasis. Persistent dilatation of small bowel loops in the mid abdomen, with some of the loops in the LEFT mid abdomen demonstrating mild bowel wall thickening. Interval laparotomy. Findings may represent postoperative ileus or persistent obstruction. Electronically Signed   By: Lavonia Dana M.D.   On: 07/01/2016 10:57    Medications: . bisacodyl  10 mg Rectal Daily  . chlorhexidine  15 mL Mouth Rinse BID  . enoxaparin (LOVENOX) injection  40 mg Subcutaneous Q24H  . famotidine (PEPCID) IV  20 mg Intravenous Q12H  . lip balm  1 application Topical BID  . mouth rinse  15 mL Mouth Rinse q12n4p  . nicotine  14 mg Transdermal Daily  . sorbitol, milk of mag, mineral oil, glycerin (SMOG) enema  960 mL Rectal Once   . dextrose 5 % and 0.9 % NaCl with KCl 40 mEq/L 75 mL/hr at 07/02/16 1621    Assessment/Plan SBO S/p  exploratory laparotomy, with lysis of adhesions, 06/25/16, Dr. Coralie Gibson POD6 Emphysematous cystitis - 7 days of Rocephin Completed on 05/25/16  Post op ileus Tobacco use Hypertension Hypokalemia - adding additional K+ to IV fluids.  Appears resolved.  Will recheck in AM. FEN: d/c NGT. Start small amt clears.   ID: No further antibiotics [id number]         LOS: 14 days    Brenda Gibson 07/03/2016

## 2016-07-03 NOTE — Progress Notes (Signed)
[facility] Progress Note  Patient: Brenda Gibson [id number]   PCP: [name], FNP DOB: 01/30/1941   DOA: 06/19/2016   DOS: 07/03/2016   Date of Service: the patient was seen and examined on 07/03/2016  Subjective: Patient has been passing gas, no further BM after yesterday. Has some abdominal cramps. No nausea. Nutrition: nothing by mouth  Brief hospital course: Pt.admitted on 06/19/2016, with complaint of Abdominal pain, was found to have small bowel obstruction as well as emphysematous cystitis. Finish IV antibiotics for emphysematous cystitis on 06/26/2016. S/P exploratory laparotomy and lysis of adhesion for SBO 06/25/2016. Currently further plan is continue to monitor for improvement in bowel obstruction.  Assessment and Plan: 1. SBO (small bowel obstruction) (HCC) Status post lysis of adhesion with expiratory laparotomy 06/25/2016. NG tube was clamped yesterday. Patient was having episodes of nausea this morning and it was placed back to suction today with significant output. Patient was nothing by mouth until this morning, surgery is advancing diet to clear liquid. Appreciate surgical consultation. We'll continue to follow the recommendation.  2. UTI, emphysematous cystitis. Completed 7 days of IV Rocephin. Continue to closely monitor.  3. Postop urinary retention. She has reoccurrence of urinary retention. Continue Foley catheter at present. Cannot start the patient on Flomax or similar medication while she is nothing by mouth. We will monitor.  4. Hypertension. Continue when necessary hydralazine.  5. Malnutrition. Patient has been moderately malnourished. Monitor the patient for advancement of diet.  Pain management: When necessary fentanyl Activity: Consulted physical therapy Bowel regimen: last BM a 07/02/2016 Diet: Clear liquid diet DVT Prophylaxis: subcutaneous Heparin  Advance goals of care discussion: Full code  Family Communication: no  family was present at bedside, at the time of interview.   Disposition:  Discharge to SNF.  Consultants: general surgery Procedures: s/p exploratory laparotomy  Antibiotics: Anti-infectives    Start     Dose/Rate Route Frequency Ordered Stop   06/25/16 0900  ceFAZolin (ANCEF) IVPB 2g/100 mL premix     2 g 200 mL/hr over 30 Minutes Intravenous  Once 06/25/16 0839 06/25/16 1315   06/20/16 1300  metroNIDAZOLE (FLAGYL) IVPB 500 mg  Status:  Discontinued     500 mg 100 mL/hr over 60 Minutes Intravenous Every 8 hours 06/20/16 1203 06/26/16 1454   06/20/16 1000  cefTRIAXone (ROCEPHIN) 1 g in dextrose 5 % 50 mL IVPB  Status:  Discontinued     1 g 100 mL/hr over 30 Minutes Intravenous Every 24 hours 06/19/16 1254 06/26/16 1454   06/19/16 1100  cefTRIAXone (ROCEPHIN) 1 g in dextrose 5 % 50 mL IVPB     1 g 100 mL/hr over 30 Minutes Intravenous  Once 06/19/16 1052 06/19/16 1129        Intake/Output Summary (Last 24 hours) at 07/03/16 1540 Last data filed at 07/03/16 1350  Gross per 24 hour  Intake             1145 ml  Output             1580 ml  Net             -435 ml   Filed Weights   07/01/16 1411 07/02/16 0500 07/03/16 0500  Weight: 70.7 kg (155 lb 13.8 oz) 69.9 kg (154 lb 1.6 oz) 65 kg (143 lb 4.8 oz)    Objective: Physical Exam: Vitals:   07/02/16 2145 07/03/16 0500 07/03/16 0522 07/03/16 1350  BP: (!) 145/82  (!) 154/76 135/69  Pulse: 82  82 82  Resp: 20  20 18   Temp: 98.5 F (36.9 C)  98.2 F (36.8 C) 98.3 F (36.8 C)  TempSrc: Oral  Oral Oral  SpO2: 99%  98% 92%  Weight:  65 kg (143 lb 4.8 oz)    Height:        General: Alert, Awake and Oriented to Time, Place and Person. Appear in moderate distress Eyes: PERRL, Conjunctiva normal ENT: Oral Mucosa clear moist. Neck: no JVD, no Abnormal Mass Or lumps Cardiovascular: S1 and S2 Present, no Murmur, Respiratory: Bilateral Air entry equal and Decreased, Clear to Auscultation, no Crackles, no wheezes Abdomen: Bowel  Sound absent, Soft and no tenderness Skin: no redness, no Rash  Extremities: no Pedal edema, no calf tenderness Neurologic: Grossly no focal neuro deficit. Bilaterally Equal motor strength  Data Reviewed: CBC:  Recent Labs Lab 06/28/16 0352 06/29/16 0405 07/01/16 0605 07/02/16 0431 07/03/16 0659  WBC 11.0* 10.5 12.5* 13.8* 11.1*  NEUTROABS  --   --  10.1*  --  9.0*  HGB 11.5* 11.7* 12.2 12.5 11.9*  HCT 35.7* 36.4 38.0 39.0 37.3  MCV 88.6 88.3 87.8 87.1 87.6  PLT 342 360 365 409* 0000000   Basic Metabolic Panel:  Recent Labs Lab 06/29/16 0405 06/29/16 1822 06/30/16 0414 07/01/16 0605 07/02/16 0431 07/03/16 0659  NA 144 142 141 144 143 143  K 2.8* 3.7 3.6 3.1* 3.5 2.9*  CL 109 109 108 108 106 105  CO2 27 25 25 27 31  35*  GLUCOSE 83 99 103* 92 162* 118*  BUN 11 9 8 9 6  <5*  CREATININE 0.41* 0.37* 0.49 0.48 0.41* 0.40*  CALCIUM 7.9* 8.0* 8.1* 7.8* 7.8* 7.8*  MG 1.6*  --  1.9 1.9  --  1.6*    Liver Function Tests:  Recent Labs Lab 07/01/16 0605 07/03/16 0659  AST 14* 21  ALT 15 19  ALKPHOS 33* 41  BILITOT 0.9 0.4  PROT 4.3* 4.3*  ALBUMIN 2.1* 2.2*   No results for input(s): LIPASE, AMYLASE in the last 168 hours. No results for input(s): AMMONIA in the last 168 hours. Coagulation Profile: No results for input(s): INR, PROTIME in the last 168 hours. Cardiac Enzymes: No results for input(s): CKTOTAL, CKMB, CKMBINDEX, TROPONINI in the last 168 hours. BNP (last 3 results) No results for input(s): PROBNP in the last 8760 hours.  CBG: No results for input(s): GLUCAP in the last 168 hours.  Studies: No results found.   Scheduled Meds: . bisacodyl  10 mg Rectal Daily  . chlorhexidine  15 mL Mouth Rinse BID  . enoxaparin (LOVENOX) injection  40 mg Subcutaneous Q24H  . famotidine (PEPCID) IV  20 mg Intravenous Q12H  . lip balm  1 application Topical BID  . mouth rinse  15 mL Mouth Rinse q12n4p  . nicotine  14 mg Transdermal Daily  . potassium chloride        . sorbitol, milk of mag, mineral oil, glycerin (SMOG) enema  960 mL Rectal Once   Continuous Infusions: . dextrose 5 % and 0.9 % NaCl with KCl 40 mEq/L 75 mL/hr at 07/02/16 1621   PRN Meds: alum & mag hydroxide-simeth, hydrALAZINE, magic mouthwash, menthol-cetylpyridinium, morphine injection, ondansetron **OR** ondansetron (ZOFRAN) IV, phenol, prochlorperazine  Time spent: 30 minutes  Author: Berle Mull, MD [facility] Pager: [phone] 07/03/2016 3:40 PM  If 7PM-7AM, please contact night-coverage at [email or web address], password [facility]

## 2016-07-04 DIAGNOSIS — E876 Hypokalemia: Secondary | ICD-10-CM | POA: Diagnosis not present

## 2016-07-04 DIAGNOSIS — R339 Retention of urine, unspecified: Secondary | ICD-10-CM | POA: Diagnosis not present

## 2016-07-04 LAB — MAGNESIUM: Magnesium: 2 mg/dL (ref 1.7–2.4)

## 2016-07-04 LAB — CBC WITH DIFFERENTIAL/PLATELET
BASOS ABS: 0 10*3/uL (ref 0.0–0.1)
BASOS PCT: 0 %
Eosinophils Absolute: 0 10*3/uL (ref 0.0–0.7)
Eosinophils Relative: 0 %
HEMATOCRIT: 36.2 % (ref 36.0–46.0)
HEMOGLOBIN: 11.8 g/dL — AB (ref 12.0–15.0)
Lymphocytes Relative: 11 %
Lymphs Abs: 1.4 10*3/uL (ref 0.7–4.0)
MCH: 27.9 pg (ref 26.0–34.0)
MCHC: 32.6 g/dL (ref 30.0–36.0)
MCV: 85.6 fL (ref 78.0–100.0)
Monocytes Absolute: 0.9 10*3/uL (ref 0.1–1.0)
Monocytes Relative: 7 %
NEUTROS ABS: 10.2 10*3/uL — AB (ref 1.7–7.7)
NEUTROS PCT: 82 %
Platelets: 324 10*3/uL (ref 150–400)
RBC: 4.23 MIL/uL (ref 3.87–5.11)
RDW: 16 % — AB (ref 11.5–15.5)
WBC: 12.6 10*3/uL — ABNORMAL HIGH (ref 4.0–10.5)

## 2016-07-04 LAB — COMPREHENSIVE METABOLIC PANEL
ALBUMIN: 2.3 g/dL — AB (ref 3.5–5.0)
ALK PHOS: 47 U/L (ref 38–126)
ALT: 21 U/L (ref 14–54)
AST: 23 U/L (ref 15–41)
Anion gap: 3 — ABNORMAL LOW (ref 5–15)
BILIRUBIN TOTAL: 0.4 mg/dL (ref 0.3–1.2)
CO2: 34 mmol/L — AB (ref 22–32)
Calcium: 8 mg/dL — ABNORMAL LOW (ref 8.9–10.3)
Chloride: 104 mmol/L (ref 101–111)
Creatinine, Ser: 0.45 mg/dL (ref 0.44–1.00)
GFR calc Af Amer: >60 mL/min (ref >60)
GFR calc non Af Amer: >60 mL/min (ref >60)
GLUCOSE: 124 mg/dL — AB (ref 65–99)
POTASSIUM: 3.9 mmol/L (ref 3.5–5.1)
SODIUM: 141 mmol/L (ref 135–145)
TOTAL PROTEIN: 4.6 g/dL — AB (ref 6.5–8.1)

## 2016-07-04 MED ORDER — TAMSULOSIN HCL 0.4 MG PO CAPS
0.4000 mg | ORAL_CAPSULE | Freq: Every day | ORAL | Status: DC
  Administered 2016-07-04 – 2016-07-07 (×4): 0.4 mg via ORAL
  Filled 2016-07-04 (×4): qty 1

## 2016-07-04 MED ORDER — PHENAZOPYRIDINE HCL 100 MG PO TABS
100.0000 mg | ORAL_TABLET | Freq: Three times a day (TID) | ORAL | Status: DC
  Administered 2016-07-04 – 2016-07-06 (×8): 100 mg via ORAL
  Filled 2016-07-04 (×12): qty 1

## 2016-07-04 MED ORDER — SIMETHICONE 80 MG PO CHEW
80.0000 mg | CHEWABLE_TABLET | Freq: Four times a day (QID) | ORAL | Status: DC
  Administered 2016-07-04 – 2016-07-07 (×13): 80 mg via ORAL
  Filled 2016-07-04 (×13): qty 1

## 2016-07-04 NOTE — Progress Notes (Signed)
I asked pt to walk and she stated she was in too much pain.  I then gave her 2 mg morphine.  She tried to walk a little bit later, then c/o that she felt woozy headed.  BP were WNL. Will continue to monitor.

## 2016-07-04 NOTE — Progress Notes (Signed)
[facility] Progress Note  Patient: Brenda Gibson [id number]   PCP: [name], FNP DOB: Jul 30, 1941   DOA: 06/19/2016   DOS: 07/04/2016   Date of Service: the patient was seen and examined on 07/04/2016  Subjective: Patient complains of gas pains. No nausea or vomiting. Continues to pass gas. No chest pain or shortness of breath. Nutrition: Tolerating clear liquids.  Brief hospital course: Pt. 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 Completed antibiotic treatment for emphysematous cystitis on 06/26/2016. Status post excluded laparotomy and lysis of adhesion for SBO on 06/25/2016. Patient developed postoperative ileus, slowly recovering. Also developed urinary retention in the hospital requiring Foley catheter Currently further plan is continue monitoring for improvement in diet tolerance.  Assessment and Plan: 1. SBO (small bowel obstruction) (HCC) S/P exploratory laparotomy on 06/25/2016 with lysis of adhesion. NG tube removed. Clear liquids tolerating. Diet is advance to full liquid. Patient does complain of some gas pain with start her on simethicone scheduled. Appreciate surgical consultation. We'll continue to follow their recommendation. Recommend out of bed to chair every 4 hours with ambulation. Nursing instructed.  2. Emphysematous cystitis, UTI. Completed 7 days of IV Rocephin currently getting better. Continue to monitor.  3. Urinary retention. Required Foley catheter insertion twice during the hospitalization. While the patient will be on full liquid as I would initiate her on Flomax today. Attempt to remove the Foley catheter this evening or tomorrow.  4. Essential hypertension. Continue when necessary hydralazine.  5. Hypomagnesemia, resolved. Hypokalemia currently resolved. Continue IV fluids with potassium. Daily monitoring of  magnesium.  6. Moderate protein calorie malnutrition. Since her diet is advance to full liquid expect improvement with supplementation.  7. Bilateral lower extremity swelling. Likely from hydration as well as immobilization. No evidence of DVT at present. We will use compression stocking. Encourage mobility.  Pain management: When necessary morphine Activity: Consulted physical therapy, recommended SNF Bowel regimen: last BM 07/03/2016 Diet: Full liquid diet DVT Prophylaxis: subcutaneous Heparin  Advance goals of care discussion: Full code  Family Communication: no family was present at bedside, at the time of interview.   Disposition:  Discharge to SNF. Expected discharge date: 07/06/2016, pending improvement in diet tolerance as well as bowel movements  Consultants: Gen. surgery,  Procedures: S/P exploratory laparotomy  Antibiotics: Anti-infectives    Start     Dose/Rate Route Frequency Ordered Stop   06/25/16 0900  ceFAZolin (ANCEF) IVPB 2g/100 mL premix     2 g 200 mL/hr over 30 Minutes Intravenous  Once 06/25/16 0839 06/25/16 1315   06/20/16 1300  metroNIDAZOLE (FLAGYL) IVPB 500 mg  Status:  Discontinued     500 mg 100 mL/hr over 60 Minutes Intravenous Every 8 hours 06/20/16 1203 06/26/16 1454   06/20/16 1000  cefTRIAXone (ROCEPHIN) 1 g in dextrose 5 % 50 mL IVPB  Status:  Discontinued     1 g 100 mL/hr over 30 Minutes Intravenous Every 24 hours 06/19/16 1254 06/26/16 1454   06/19/16 1100  cefTRIAXone (ROCEPHIN) 1 g in dextrose 5 % 50 mL IVPB     1 g 100 mL/hr over 30 Minutes Intravenous  Once 06/19/16 1052 06/19/16 1129        Intake/Output Summary (Last 24 hours) at 07/04/16 0840 Last data filed at 07/04/16 0600  Gross per 24 hour  Intake             2975 ml  Output             1550 ml  Net             1425 ml   Filed Weights   07/02/16 0500 07/03/16 0500 07/04/16 0600  Weight: 69.9 kg (154 lb 1.6 oz) 65 kg (143 lb 4.8 oz) 65.2 kg (143 lb 11.8 oz)     Objective: Physical Exam: Vitals:   07/03/16 0522 07/03/16 1350 07/03/16 2147 07/04/16 0600  BP: (!) 154/76 135/69 (!) 146/73 140/77  Pulse: 82 82 87 87  Resp: 20 18 18 18   Temp: 98.2 F (36.8 C) 98.3 F (36.8 C) 98.3 F (36.8 C) 98.4 F (36.9 C)  TempSrc: Oral Oral Oral Oral  SpO2: 98% 92% 94% 93%  Weight:    65.2 kg (143 lb 11.8 oz)  Height:        General: Alert, Awake and Oriented to Time, Place and Person. Appear in mild distress Eyes: PERRL, Conjunctiva normal ENT: Oral Mucosa clear moist. Neck: no JVD, no Abnormal Mass Or lumps Cardiovascular: S1 and S2 Present, no Murmur, Respiratory: Bilateral Air entry equal and Decreased, Clear to Auscultation, no Crackles, no wheezes Abdomen: Bowel Sound present, Soft and mild tenderness Skin: no redness, no Rash  Extremities: bilateral Pedal edema, no calf tenderness Neurologic: Grossly no focal neuro deficit. Bilaterally Equal motor strength  Data Reviewed: CBC:  Recent Labs Lab 06/29/16 0405 07/01/16 0605 07/02/16 0431 07/03/16 0659 07/04/16 0534  WBC 10.5 12.5* 13.8* 11.1* 12.6*  NEUTROABS  --  10.1*  --  9.0* 10.2*  HGB 11.7* 12.2 12.5 11.9* 11.8*  HCT 36.4 38.0 39.0 37.3 36.2  MCV 88.3 87.8 87.1 87.6 85.6  PLT 360 365 409* 370 0000000   Basic Metabolic Panel:  Recent Labs Lab 06/29/16 0405  06/30/16 0414 07/01/16 0605 07/02/16 0431 07/03/16 0659 07/03/16 1645 07/04/16 0534  NA 144  < > 141 144 143 143 140 141  K 2.8*  < > 3.6 3.1* 3.5 2.9* 3.9 3.9  CL 109  < > 108 108 106 105 104 104  CO2 27  < > 25 27 31  35* 31 34*  GLUCOSE 83  < > 103* 92 162* 118* 120* 124*  BUN 11  < > 8 9 6  <5* <5* <5*  CREATININE 0.41*  < > 0.49 0.48 0.41* 0.40* 0.40* 0.45  CALCIUM 7.9*  < > 8.1* 7.8* 7.8* 7.8* 7.8* 8.0*  MG 1.6*  --  1.9 1.9  --  1.6*  --  2.0  < > = values in this interval not displayed.  Liver Function Tests:  Recent Labs Lab 07/01/16 0605 07/03/16 0659 07/04/16 0534  AST 14* 21 23  ALT 15 19 21    ALKPHOS 33* 41 47  BILITOT 0.9 0.4 0.4  PROT 4.3* 4.3* 4.6*  ALBUMIN 2.1* 2.2* 2.3*   No results for input(s): LIPASE, AMYLASE in the last 168 hours. No results for input(s): AMMONIA in the last 168 hours. Coagulation Profile: No results for input(s): INR, PROTIME in the last 168 hours. Cardiac Enzymes: No results for input(s): CKTOTAL, CKMB, CKMBINDEX, TROPONINI in the last 168 hours. BNP (last 3 results) No results for input(s): PROBNP in the last 8760 hours.  CBG: No results for input(s): GLUCAP in the last 168 hours.  Studies: No results found.   Scheduled Meds: . bisacodyl  10 mg Rectal Daily  . chlorhexidine  15 mL Mouth Rinse BID  . enoxaparin (LOVENOX) injection  40 mg Subcutaneous  Q24H  . famotidine (PEPCID) IV  20 mg Intravenous Q12H  . lip balm  1 application Topical BID  . mouth rinse  15 mL Mouth Rinse q12n4p  . nicotine  14 mg Transdermal Daily  . phenazopyridine  100 mg Oral TID WC  . simethicone  80 mg Oral QID  . sorbitol, milk of mag, mineral oil, glycerin (SMOG) enema  960 mL Rectal Once  . tamsulosin  0.4 mg Oral Daily   Continuous Infusions: . dextrose 5 % and 0.9 % NaCl with KCl 40 mEq/L 75 mL/hr at 07/04/16 0544   PRN Meds: alum & mag hydroxide-simeth, hydrALAZINE, magic mouthwash, menthol-cetylpyridinium, morphine injection, ondansetron **OR** ondansetron (ZOFRAN) IV, phenol, prochlorperazine  Time spent: 30 minutes  Author: Berle Mull, MD [facility] Pager: [phone] 07/04/2016 8:40 AM  If 7PM-7AM, please contact night-coverage at [email or web address], password [facility]

## 2016-07-04 NOTE — Progress Notes (Signed)
Patient ID: Brenda Gibson, female   DOB: [birth year], 75 y.o.   MRN: [id number] 9 Days Post-Op  Subjective: Continuing to pass gas.  Had 1 stool.  C/o some gas pains.  No n/v with bariatric clears yesterday.  Objective: Vital signs in last 24 hours: Temp:  [98.3 F (36.8 C)-98.4 F (36.9 C)] 98.4 F (36.9 C) (09/03 0600) Pulse Rate:  [82-87] 87 (09/03 0600) Resp:  [18] 18 (09/03 0600) BP: (135-146)/(69-77) 140/77 (09/03 0600) SpO2:  [92 %-94 %] 93 % (09/03 0600) Weight:  [65.2 kg (143 lb 11.8 oz)] 65.2 kg (143 lb 11.8 oz) (09/03 0600) Last BM Date: 07/02/16  Intake/Output from previous day: 09/02 0701 - 09/03 0700 In: 2975 [P.O.:1080; I.V.:1795; IV Piggyback:100] Out: 1550 [Urine:1550] Intake/Output this shift: No intake/output data recorded.  General appearance: alert, cooperative and no distress GI: soft, non-tender; and midlly distended.  Lab Results:   Recent Labs  07/03/16 0659 07/04/16 0534  WBC 11.1* 12.6*  HGB 11.9* 11.8*  HCT 37.3 36.2  PLT 370 324    BMET  Recent Labs  07/03/16 1645 07/04/16 0534  NA 140 141  K 3.9 3.9  CL 104 104  CO2 31 34*  GLUCOSE 120* 124*  BUN <5* <5*  CREATININE 0.40* 0.45  CALCIUM 7.8* 8.0*   PT/INR No results for input(s): LABPROT, INR in the last 72 hours.   Recent Labs Lab 07/01/16 0605 07/03/16 0659 07/04/16 0534  AST 14* 21 23  ALT 15 19 21   ALKPHOS 33* 41 47  BILITOT 0.9 0.4 0.4  PROT 4.3* 4.3* 4.6*  ALBUMIN 2.1* 2.2* 2.3*     Lipase     Component Value Date/Time   LIPASE 24 06/19/2016 0739     Studies/Results: No results found.  Medications: . bisacodyl  10 mg Rectal Daily  . chlorhexidine  15 mL Mouth Rinse BID  . enoxaparin (LOVENOX) injection  40 mg Subcutaneous Q24H  . famotidine (PEPCID) IV  20 mg Intravenous Q12H  . lip balm  1 application Topical BID  . mouth rinse  15 mL Mouth Rinse q12n4p  . nicotine  14 mg Transdermal Daily  . phenazopyridine  100 mg Oral TID WC  . simethicone   80 mg Oral QID  . sorbitol, milk of mag, mineral oil, glycerin (SMOG) enema  960 mL Rectal Once  . tamsulosin  0.4 mg Oral Daily   . dextrose 5 % and 0.9 % NaCl with KCl 40 mEq/L 75 mL/hr at 07/04/16 0544    Assessment/Plan SBO S/p exploratory laparotomy, with lysis of adhesions, 06/25/16, Dr. 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  Encouraged patient to go slowly.   ID: No further antibiotics [id number]         LOS: 15 days    Brenda Gibson 07/04/2016

## 2016-07-05 LAB — CBC WITH DIFFERENTIAL/PLATELET
BASOS PCT: 0 %
Basophils Absolute: 0 10*3/uL (ref 0.0–0.1)
Eosinophils Absolute: 0 10*3/uL (ref 0.0–0.7)
Eosinophils Relative: 0 %
HEMATOCRIT: 35.3 % — AB (ref 36.0–46.0)
HEMOGLOBIN: 11.3 g/dL — AB (ref 12.0–15.0)
LYMPHS ABS: 1.4 10*3/uL (ref 0.7–4.0)
Lymphocytes Relative: 13 %
MCH: 27.4 pg (ref 26.0–34.0)
MCHC: 32 g/dL (ref 30.0–36.0)
MCV: 85.5 fL (ref 78.0–100.0)
MONO ABS: 1 10*3/uL (ref 0.1–1.0)
MONOS PCT: 10 %
NEUTROS ABS: 8 10*3/uL — AB (ref 1.7–7.7)
Neutrophils Relative %: 77 %
Platelets: 308 10*3/uL (ref 150–400)
RBC: 4.13 MIL/uL (ref 3.87–5.11)
RDW: 15.8 % — AB (ref 11.5–15.5)
WBC: 10.4 10*3/uL (ref 4.0–10.5)

## 2016-07-05 LAB — COMPREHENSIVE METABOLIC PANEL
ALK PHOS: 51 U/L (ref 38–126)
ALT: 23 U/L (ref 14–54)
ANION GAP: 4 — AB (ref 5–15)
AST: 25 U/L (ref 15–41)
Albumin: 2.2 g/dL — ABNORMAL LOW (ref 3.5–5.0)
BILIRUBIN TOTAL: 0.5 mg/dL (ref 0.3–1.2)
BUN: <5 mg/dL — ABNORMAL LOW (ref 6–20)
CALCIUM: 7.7 mg/dL — AB (ref 8.9–10.3)
CO2: 30 mmol/L (ref 22–32)
Chloride: 106 mmol/L (ref 101–111)
Creatinine, Ser: 0.44 mg/dL (ref 0.44–1.00)
Glucose, Bld: 122 mg/dL — ABNORMAL HIGH (ref 65–99)
POTASSIUM: 4 mmol/L (ref 3.5–5.1)
Sodium: 140 mmol/L (ref 135–145)
TOTAL PROTEIN: 4.4 g/dL — AB (ref 6.5–8.1)

## 2016-07-05 LAB — MAGNESIUM: MAGNESIUM: 1.9 mg/dL (ref 1.7–2.4)

## 2016-07-05 MED ORDER — METOCLOPRAMIDE HCL 5 MG/ML IJ SOLN
5.0000 mg | Freq: Three times a day (TID) | INTRAMUSCULAR | Status: DC
  Administered 2016-07-05 – 2016-07-07 (×7): 5 mg via INTRAVENOUS
  Filled 2016-07-05 (×7): qty 2

## 2016-07-05 NOTE — Progress Notes (Signed)
Physical Therapy Treatment Patient Details Name: Brenda Gibson MRN: [id number] DOB: May 29, 1941 Today's Date: 07/05/2016    History of Present Illness , S/P exploratory lap for SBO on 06/25/16, post op ileus.     PT Comments    Progressing with mobility.   Follow Up Recommendations  SNF     Equipment Recommendations       Recommendations for Other Services       Precautions / Restrictions Precautions Precautions: Fall Restrictions Weight Bearing Restrictions: No    Mobility  Bed Mobility Overal bed mobility: Needs Assistance Bed Mobility: Supine to Sit;Sit to Supine     Supine to sit: Min guard;HOB elevated Sit to supine: Mod assist   General bed mobility comments: Assist for LEs onto bed. Increased time. Moderate reliance on bedrail to get to EOB  Transfers Overall transfer level: Needs assistance Equipment used: Rolling walker (2 wheeled) Transfers: Sit to/from Stand Sit to Stand: Min assist;From elevated surface         General transfer comment: Assist to rise, stabilize, control descent. VCs safety, technique, hand placement.  Ambulation/Gait Ambulation/Gait assistance: Min guard Ambulation Distance (Feet): 100 Feet (x2) Assistive device: Rolling walker (2 wheeled) Gait Pattern/deviations: Step-through pattern;Decreased stride length     General Gait Details: slow gait speed. Pt denied dizziness this session. O2 sats 96% on RA.    Stairs            Wheelchair Mobility    Modified Rankin (Stroke Patients Only)       Balance                                    Cognition Arousal/Alertness: Awake/alert Behavior During Therapy: WFL for tasks assessed/performed Overall Cognitive Status: Within Functional Limits for tasks assessed                      Exercises      General Comments        Pertinent Vitals/Pain Pain Assessment: Faces Faces Pain Scale: Hurts a little bit Pain Location: abd Pain Descriptors /  Indicators: Sore Pain Intervention(s): Premedicated before session    Home Living                      Prior Function            PT Goals (current goals can now be found in the care plan section) Progress towards PT goals: Progressing toward goals    Frequency  Min 3X/week    PT Plan Current plan remains appropriate    Co-evaluation             End of Session   Activity Tolerance: Patient tolerated treatment well Patient left: in bed;with call bell/phone within reach     Time: 1006-1033 PT Time Calculation (min) (ACUTE ONLY): 27 min  Charges:  $Gait Training: 23-37 mins                    G Codes:      Weston Anna, MPT Pager: [phone]

## 2016-07-05 NOTE — Progress Notes (Signed)
[facility] Progress Note  Patient: Brenda Gibson [id number]   PCP: [name], FNP DOB: 1941/04/06   DOA: 06/19/2016   DOS: 07/05/2016   Date of Service: the patient was seen and examined on 07/05/2016  Subjective: Complains about gas pain. Some bloating. It helped bowel movement. Continues to have passing gas. Nutrition: Tolerating clear liquids.  Brief hospital course: Pt. 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 Completed antibiotic treatment for emphysematous cystitis on 06/26/2016. Status post excluded laparotomy and lysis of adhesion for SBO on 06/25/2016. Patient developed postoperative ileus, slowly recovering. Also developed urinary retention in the hospital requiring Foley catheter Currently further plan is continue monitoring for improvement in diet tolerance.  Assessment and Plan: 1. SBO (small bowel obstruction) (HCC) S/P exploratory laparotomy on 06/25/2016 with lysis of adhesion. Postoperative ileus.  NG tube removed. Clear liquids tolerating. Diet is advance to full liquid. Patient does complain of some gas pain with start her on simethicone scheduled. Appreciate surgical consultation. We'll continue to follow their recommendation. Recommend out of bed to chair every 4 hours with ambulation. Nursing instructed. We'll start the patient on Reglan 5 mg  2. Emphysematous cystitis, UTI. Completed 7 days of IV Rocephin currently getting better. Continue to monitor.  3. Urinary retention. Required Foley catheter insertion twice during the hospitalization. Remove Foley catheter, continue Flomax, bladder scan every 4 hours. If the patient requires reinsertion of Foley catheter, urology will be consulted.  4. Essential hypertension. Continue when necessary hydralazine.  5. Hypomagnesemia, resolved. Hypokalemia currently  resolved. Continue IV fluids with potassium. Daily monitoring of magnesium.  6. Moderate protein calorie malnutrition. Since her diet is advance to full liquid expect improvement with supplementation.  7. Bilateral lower extremity swelling. Likely from hydration as well as immobilization. No evidence of DVT at present. We will use compression stocking. Encourage mobility.  Pain management: When necessary morphine Activity: Consulted physical therapy, recommended SNF Bowel regimen: last BM 07/05/2016 Diet: Full liquid diet DVT Prophylaxis: subcutaneous Heparin  Advance goals of care discussion: Full code  Family Communication: no family was present at bedside, at the time of interview.   Disposition:  Discharge to SNF. Expected discharge date: 07/08/2016, pending improvement in diet tolerance as well as bowel movements  Consultants: Gen. surgery,  Procedures: S/P exploratory laparotomy  Antibiotics: Anti-infectives    Start     Dose/Rate Route Frequency Ordered Stop   06/25/16 0900  ceFAZolin (ANCEF) IVPB 2g/100 mL premix     2 g 200 mL/hr over 30 Minutes Intravenous  Once 06/25/16 0839 06/25/16 1315   06/20/16 1300  metroNIDAZOLE (FLAGYL) IVPB 500 mg  Status:  Discontinued     500 mg 100 mL/hr over 60 Minutes Intravenous Every 8 hours 06/20/16 1203 06/26/16 1454   06/20/16 1000  cefTRIAXone (ROCEPHIN) 1 g in dextrose 5 % 50 mL IVPB  Status:  Discontinued     1 g 100 mL/hr over 30 Minutes Intravenous Every 24 hours 06/19/16 1254 06/26/16 1454   06/19/16 1100  cefTRIAXone (ROCEPHIN) 1 g in dextrose 5 % 50 mL IVPB     1 g 100 mL/hr over 30 Minutes Intravenous  Once 06/19/16 1052 06/19/16 1129        Intake/Output Summary (Last 24 hours) at 07/05/16 1705 Last data filed at 07/05/16 1700  Gross per 24 hour  Intake          2501.25 ml  Output             1950 ml  Net           551.25 ml   Filed Weights   07/03/16 0500 07/04/16 0600 07/05/16 0700  Weight: 65 kg (143  lb 4.8 oz) 65.2 kg (143 lb 11.8 oz) 68.7 kg (151 lb 7.3 oz)    Objective: Physical Exam: Vitals:   07/04/16 2036 07/05/16 0318 07/05/16 0700 07/05/16 1407  BP: (!) 162/94 (!) 159/92 (!) 148/83 (!) 149/79  Pulse: 94 81 82 88  Resp: 18 18 18 18   Temp: 98.1 F (36.7 C) 98.3 F (36.8 C) 97.8 F (36.6 C) 98.3 F (36.8 C)  TempSrc: Oral Oral Oral Oral  SpO2: 91% 97% 96% 97%  Weight:   68.7 kg (151 lb 7.3 oz)   Height:        General: Alert, Awake and Oriented to Time, Place and Person. Appear in mild distress Eyes: PERRL, Conjunctiva normal ENT: Oral Mucosa clear moist. Neck: no JVD, no Abnormal Mass Or lumps Cardiovascular: S1 and S2 Present, no Murmur, Respiratory: Bilateral Air entry equal and Decreased, Clear to Auscultation, no Crackles, no wheezes Abdomen: Bowel Sound present, Soft and mild tenderness Skin: no redness, no Rash  Extremities: bilateral Pedal edema, no calf tenderness Neurologic: Grossly no focal neuro deficit. Bilaterally Equal motor strength  Data Reviewed: CBC:  Recent Labs Lab 07/01/16 0605 07/02/16 0431 07/03/16 0659 07/04/16 0534 07/05/16 0433  WBC 12.5* 13.8* 11.1* 12.6* 10.4  NEUTROABS 10.1*  --  9.0* 10.2* 8.0*  HGB 12.2 12.5 11.9* 11.8* 11.3*  HCT 38.0 39.0 37.3 36.2 35.3*  MCV 87.8 87.1 87.6 85.6 85.5  PLT 365 409* 370 324 [id number]   Basic Metabolic Panel:  Recent Labs Lab 06/30/16 0414 07/01/16 0605 07/02/16 0431 07/03/16 0659 07/03/16 1645 07/04/16 0534 07/05/16 0433  NA 141 144 143 143 140 141 140  K 3.6 3.1* 3.5 2.9* 3.9 3.9 4.0  CL 108 108 106 105 104 104 106  CO2 25 27 31  35* 31 34* 30  GLUCOSE 103* 92 162* 118* 120* 124* 122*  BUN 8 9 6  <5* <5* <5* <5*  CREATININE 0.49 0.48 0.41* 0.40* 0.40* 0.45 0.44  CALCIUM 8.1* 7.8* 7.8* 7.8* 7.8* 8.0* 7.7*  MG 1.9 1.9  --  1.6*  --  2.0 1.9    Liver Function Tests:  Recent Labs Lab 07/01/16 0605 07/03/16 0659 07/04/16 0534 07/05/16 0433  AST 14* 21 23 25   ALT 15 19 21 23    ALKPHOS 33* 41 47 51  BILITOT 0.9 0.4 0.4 0.5  PROT 4.3* 4.3* 4.6* 4.4*  ALBUMIN 2.1* 2.2* 2.3* 2.2*   No results for input(s): LIPASE, AMYLASE in the last 168 hours. No results for input(s): AMMONIA in the last 168 hours. Coagulation Profile: No results for input(s): INR, PROTIME in the last 168 hours. Cardiac Enzymes: No results for input(s): CKTOTAL, CKMB, CKMBINDEX, TROPONINI in the last 168 hours. BNP (last 3 results) No results for input(s): PROBNP in the last 8760 hours.  CBG: No results for input(s): GLUCAP in the last 168 hours.  Studies: No results found.   Scheduled Meds: . chlorhexidine  15 mL Mouth Rinse BID  . enoxaparin (LOVENOX) injection  40 mg Subcutaneous Q24H  . famotidine (PEPCID) IV  20 mg Intravenous Q12H  . lip balm  1 application Topical BID  . mouth rinse  15 mL Mouth Rinse q12n4p  . metoCLOPramide (REGLAN) injection  5 mg  Intravenous Q8H  . nicotine  14 mg Transdermal Daily  . phenazopyridine  100 mg Oral TID WC  . simethicone  80 mg Oral QID  . sorbitol, milk of mag, mineral oil, glycerin (SMOG) enema  960 mL Rectal Once  . tamsulosin  0.4 mg Oral Daily   Continuous Infusions: . dextrose 5 % and 0.9 % NaCl with KCl 40 mEq/L 75 mL/hr at 07/04/16 1736   PRN Meds: alum & mag hydroxide-simeth, hydrALAZINE, magic mouthwash, menthol-cetylpyridinium, morphine injection, ondansetron **OR** ondansetron (ZOFRAN) IV, phenol, prochlorperazine  Time spent: 30 minutes  Author: Berle Mull, MD [facility] Pager: [phone] 07/05/2016 5:05 PM  If 7PM-7AM, please contact night-coverage at [email or web address], password [facility]

## 2016-07-05 NOTE — Progress Notes (Signed)
Patient ID: ZAPHIRA WEIMAN, female   DOB: [birth year], 75 y.o.   MRN: [id number] 10 Days Post-Op  Subjective: Had another stool.  Tolerated full liquids without n/v, but still having quite a bit of gas pain.    Objective: Vital signs in last 24 hours: Temp:  [97.8 F (36.6 C)-98.3 F (36.8 C)] 97.8 F (36.6 C) (09/04 0700) Pulse Rate:  [81-94] 82 (09/04 0700) Resp:  [17-18] 18 (09/04 0700) BP: (148-162)/(83-94) 148/83 (09/04 0700) SpO2:  [91 %-97 %] 96 % (09/04 0700) Weight:  [68.7 kg (151 lb 7.3 oz)] 68.7 kg (151 lb 7.3 oz) (09/04 0700) Last BM Date: 07/02/16  Intake/Output from previous day: 09/03 0701 - 09/04 0700 In: 2501.3 [P.O.:600; I.V.:1801.3; IV Piggyback:100] Out: 1500 [Urine:1500] Intake/Output this shift: No intake/output data recorded.  General appearance: alert, cooperative and no distress GI: soft, non-tender; and mildly distended, but much softer than yesterday.    Lab Results:   Recent Labs  07/04/16 0534 07/05/16 0433  WBC 12.6* 10.4  HGB 11.8* 11.3*  HCT 36.2 35.3*  PLT 324 308    BMET  Recent Labs  07/04/16 0534 07/05/16 0433  NA 141 140  K 3.9 4.0  CL 104 106  CO2 34* 30  GLUCOSE 124* 122*  BUN <5* <5*  CREATININE 0.45 0.44  CALCIUM 8.0* 7.7*   PT/INR No results for input(s): LABPROT, INR in the last 72 hours.   Recent Labs Lab 07/01/16 0605 07/03/16 0659 07/04/16 0534 07/05/16 0433  AST 14* 21 23 25   ALT 15 19 21 23   ALKPHOS 33* 41 47 51  BILITOT 0.9 0.4 0.4 0.5  PROT 4.3* 4.3* 4.6* 4.4*  ALBUMIN 2.1* 2.2* 2.3* 2.2*     Lipase     Component Value Date/Time   LIPASE 24 06/19/2016 0739     Studies/Results: No results found.  Medications: . bisacodyl  10 mg Rectal Daily  . chlorhexidine  15 mL Mouth Rinse BID  . enoxaparin (LOVENOX) injection  40 mg Subcutaneous Q24H  . famotidine (PEPCID) IV  20 mg Intravenous Q12H  . lip balm  1 application Topical BID  . mouth rinse  15 mL Mouth Rinse q12n4p  . nicotine  14 mg  Transdermal Daily  . phenazopyridine  100 mg Oral TID WC  . simethicone  80 mg Oral QID  . sorbitol, milk of mag, mineral oil, glycerin (SMOG) enema  960 mL Rectal Once  . tamsulosin  0.4 mg Oral Daily   . dextrose 5 % and 0.9 % NaCl with KCl 40 mEq/L 75 mL/hr at 07/04/16 1736    Assessment/Plan SBO S/p exploratory laparotomy, with lysis of adhesions, 06/25/16, Dr. 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 Would hold on regular diet until tomorrow.  Tobacco use Hypertension Primary team to try low dose reglan. .   ID: No further antibiotics [id number]         LOS: 16 days    Dianne Whelchel 07/05/2016

## 2016-07-05 NOTE — Progress Notes (Signed)
Pt had a small bm just a few moments ago.  Will continue to monitor.

## 2016-07-06 LAB — CBC WITH DIFFERENTIAL/PLATELET
Basophils Absolute: 0 10*3/uL (ref 0.0–0.1)
Basophils Relative: 0 %
EOS ABS: 0 10*3/uL (ref 0.0–0.7)
Eosinophils Relative: 0 %
HCT: 35.1 % — ABNORMAL LOW (ref 36.0–46.0)
HEMOGLOBIN: 11.4 g/dL — AB (ref 12.0–15.0)
LYMPHS ABS: 1.7 10*3/uL (ref 0.7–4.0)
LYMPHS PCT: 20 %
MCH: 27.6 pg (ref 26.0–34.0)
MCHC: 32.5 g/dL (ref 30.0–36.0)
MCV: 85 fL (ref 78.0–100.0)
MONOS PCT: 10 %
Monocytes Absolute: 0.8 10*3/uL (ref 0.1–1.0)
NEUTROS PCT: 70 %
Neutro Abs: 5.7 10*3/uL (ref 1.7–7.7)
Platelets: 309 10*3/uL (ref 150–400)
RBC: 4.13 MIL/uL (ref 3.87–5.11)
RDW: 15.8 % — ABNORMAL HIGH (ref 11.5–15.5)
WBC: 8.3 10*3/uL (ref 4.0–10.5)

## 2016-07-06 LAB — COMPREHENSIVE METABOLIC PANEL
ALK PHOS: 54 U/L (ref 38–126)
ALT: 24 U/L (ref 14–54)
ANION GAP: 5 (ref 5–15)
AST: 26 U/L (ref 15–41)
Albumin: 2.2 g/dL — ABNORMAL LOW (ref 3.5–5.0)
BILIRUBIN TOTAL: 0.3 mg/dL (ref 0.3–1.2)
BUN: <5 mg/dL — ABNORMAL LOW (ref 6–20)
CALCIUM: 8 mg/dL — AB (ref 8.9–10.3)
CO2: 28 mmol/L (ref 22–32)
CREATININE: 0.46 mg/dL (ref 0.44–1.00)
Chloride: 105 mmol/L (ref 101–111)
Glucose, Bld: 113 mg/dL — ABNORMAL HIGH (ref 65–99)
Potassium: 4 mmol/L (ref 3.5–5.1)
SODIUM: 138 mmol/L (ref 135–145)
Total Protein: 4.8 g/dL — ABNORMAL LOW (ref 6.5–8.1)

## 2016-07-06 LAB — MAGNESIUM: Magnesium: 1.9 mg/dL (ref 1.7–2.4)

## 2016-07-06 MED ORDER — POLYETHYLENE GLYCOL 3350 17 G PO PACK
17.0000 g | PACK | Freq: Every day | ORAL | Status: DC
  Administered 2016-07-06 – 2016-07-07 (×2): 17 g via ORAL
  Filled 2016-07-06 (×2): qty 1

## 2016-07-06 NOTE — Progress Notes (Signed)
11 Days Post-Op  Subjective: She looks good, incision healing nicely tolerating full liquids and having BM.   Objective: Vital signs in last 24 hours: Temp:  [97.4 F (36.3 C)-98.4 F (36.9 C)] 97.4 F (36.3 C) (09/05 0535) Pulse Rate:  [79-88] 79 (09/05 0535) Resp:  [18] 18 (09/05 0535) BP: (149-157)/(57-79) 150/67 (09/05 0535) SpO2:  [96 %-98 %] 98 % (09/05 0535) Weight:  [71.6 kg (157 lb 13.6 oz)] 71.6 kg (157 lb 13.6 oz) (09/05 0500) Last BM Date: 07/02/16 840 PO BM x 2 yesterday and one today Afebrile, VSS Lab IOK  Intake/Output from previous day: 09/04 0701 - 09/05 0700 In: 2690 [P.O.:840; I.V.:1800; IV Piggyback:50] Out: 2250 [Urine:2250] Intake/Output this shift: No intake/output data recorded.  General appearance: alert, cooperative and no distress Resp: clear to auscultation bilaterally GI: soft, few BS, still hypoactive.  Incision looks fine +BM Extremities: edema both lower legs.     Lab Results:   Recent Labs  07/05/16 0433 07/06/16 0439  WBC 10.4 8.3  HGB 11.3* 11.4*  HCT 35.3* 35.1*  PLT 308 309    BMET  Recent Labs  07/05/16 0433 07/06/16 0439  NA 140 138  K 4.0 4.0  CL 106 105  CO2 30 28  GLUCOSE 122* 113*  BUN <5* <5*  CREATININE 0.44 0.46  CALCIUM 7.7* 8.0*   PT/INR No results for input(s): LABPROT, INR in the last 72 hours.   Recent Labs Lab 07/01/16 0605 07/03/16 0659 07/04/16 0534 07/05/16 0433 07/06/16 0439  AST 14* 21 23 25 26   ALT 15 19 21 23 24   ALKPHOS 33* 41 47 51 54  BILITOT 0.9 0.4 0.4 0.5 0.3  PROT 4.3* 4.3* 4.6* 4.4* 4.8*  ALBUMIN 2.1* 2.2* 2.3* 2.2* 2.2*     Lipase     Component Value Date/Time   LIPASE 24 06/19/2016 0739     Studies/Results: No results found.  Medications: . chlorhexidine  15 mL Mouth Rinse BID  . enoxaparin (LOVENOX) injection  40 mg Subcutaneous Q24H  . famotidine (PEPCID) IV  20 mg Intravenous Q12H  . lip balm  1 application Topical BID  . mouth rinse  15 mL Mouth Rinse  q12n4p  . metoCLOPramide (REGLAN) injection  5 mg Intravenous Q8H  . nicotine  14 mg Transdermal Daily  . phenazopyridine  100 mg Oral TID WC  . simethicone  80 mg Oral QID  . sorbitol, milk of mag, mineral oil, glycerin (SMOG) enema  960 mL Rectal Once  . tamsulosin  0.4 mg Oral Daily   . dextrose 5 % and 0.9 % NaCl with KCl 40 mEq/L 75 mL/hr at 07/05/16 2336    Assessment/Plan SBO S/p exploratory laparotomy, with lysis of adhesions, 06/25/16, Dr. 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  She is getting close to going home.  It looks like she needs assist to stand and some help with mobilization.  She is on IV Reglan, I am going to stop the IV fluids, ask Case management to see and talk about home.  She is going to have her son there, but he works.  Add Miralax also.  I will let Medicine decide on Reglan and possible diuresis.      LOS:  17 days    Sadler Teschner 07/06/2016 [phone]

## 2016-07-06 NOTE — Progress Notes (Signed)
Discharge planning, spoke with patient at beside. Chose [facility] for HH services, contacted [facility] for referral. Needs RW and 3-n-1, contacted [facility] to deliver to room. [phone] 

## 2016-07-06 NOTE — Care Management Important Message (Signed)
Important Message  Patient Details  Name: JELLISA MCTEE MRN: [id number] Date of Birth: Apr 29, 1941   Medicare Important Message Given:  Yes    [name] 07/06/2016, 11:01 AMImportant Message  Patient Details  Name: IVIE KERBOW MRN: [id number] Date of Birth: 04-29-1941   Medicare Important Message Given:  Yes    [name] 07/06/2016, 11:01 AM

## 2016-07-06 NOTE — Clinical Social Work Note (Signed)
MSW reviewed chart and noted per [facility] note that patient prefers to return home with home health services at this time. Patient lives alone however indicated she will discuss with her son about him moving in for a while to assist her.   PT also recommending HH.   Medical Social Worker will sign off for now as social work intervention is no longer needed. Please consult [place] again if new need arises.  Glendon Axe, MSW [phone] 07/06/2016 12:55 PM

## 2016-07-06 NOTE — Progress Notes (Signed)
Physical Therapy Treatment Patient Details Name: Brenda Gibson MRN: [id number] DOB: 09-24-1941 Today's Date: 07/06/2016    History of Present Illness , S/P exploratory lap for SBO on 06/25/16, post op ileus.     PT Comments    Pt progressing well and now wants to go home.  Will update LPT. Pt will need a walker and HH PT.   Follow Up Recommendations  Home health PT (pt declines SNF and plans to D/C to home with son staying overnight.)     Equipment Recommendations  Rolling walker with 5" wheels    Recommendations for Other Services       Precautions / Restrictions Precautions Precautions: Fall Restrictions Weight Bearing Restrictions: No    Mobility  Bed Mobility Overal bed mobility: Needs Assistance Bed Mobility: Supine to Sit   Sidelying to sit: Min guard;Supervision   Sit to supine: Min assist   General bed mobility comments: able to self perform with increased time.  Extra assist to support B LE up onto bed due to "heaviness" B LE edema  Transfers Overall transfer level: Needs assistance Equipment used: Rolling walker (2 wheeled) Transfers: Sit to/from [facility] Sit to Stand: Supervision;Min guard         General transfer comment: good use of B UE's to support self.  Pt able to self rise from elevated bed but required a little assist from lower level chair.   Ambulation/Gait Ambulation/Gait assistance: Supervision;Min guard Ambulation Distance (Feet): 200 Feet (100 x 2 one sitting rest break) Assistive device: Rolling walker (2 wheeled) Gait Pattern/deviations: Step-through pattern Gait velocity: decreased   General Gait Details: tolerated well using walker.  Mild c/o UE weakness.   Stairs            Wheelchair Mobility    Modified Rankin (Stroke Patients Only)       Balance                                    Cognition Arousal/Alertness: Awake/alert Behavior During Therapy: WFL for tasks  assessed/performed Overall Cognitive Status: Within Functional Limits for tasks assessed                      Exercises      General Comments        Pertinent Vitals/Pain Pain Assessment: No/denies pain    Home Living                      Prior Function            PT Goals (current goals can now be found in the care plan section) Progress towards PT goals: Progressing toward goals    Frequency  Min 3X/week    PT Plan Current plan remains appropriate    Co-evaluation             End of Session Equipment Utilized During Treatment: Gait belt Activity Tolerance: Patient tolerated treatment well Patient left: in bed;with call bell/phone within reach     Time: 0927-0951 PT Time Calculation (min) (ACUTE ONLY): 24 min  Charges:  $Gait Training: 8-22 mins $Therapeutic Activity: 8-22 mins                    G Codes:      Rica Koyanagi  PTA [facility]  Acute  Rehab Pager      [phone]

## 2016-07-06 NOTE — Progress Notes (Signed)
Spoke with patient at bedside. She no longer wants SNF for rehab, states she feels she can do okay at home. She lives alone but says she has talked with her son about moving in for a while. She feels she will be safe at home with [facility] services. Discussed PT services and how often they might come. She is requesting a RW. Contacted [facility] to deliver to room. She will review [facility] list provided and I plan to f\u later on her decision. Awaiting final orders.

## 2016-07-06 NOTE — Progress Notes (Signed)
[facility] Progress Note  Patient: Brenda Gibson [id number]   PCP: [name], FNP DOB: [birth year]   DOA: 06/19/2016   DOS: 07/06/2016   Date of Service: the patient was seen and examined on 07/06/2016  Subjective: Improvement in diet tolerance as well as improvement in bowel movement. No nausea no vomiting. Nutrition: Tolerating clear liquids.  Brief hospital course: Pt. 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 Patient has prolonged hospitalization, with initial conservative management followed by surgical evaluation followed by postoperative ileus and currently showing improvement.  Completed antibiotic treatment for emphysematous cystitis on 06/26/2016. Status post excluded laparotomy and lysis of adhesion for SBO on 06/25/2016. Patient developed postoperative ileus, slowly recovering. Also developed urinary retention in the hospital requiring Foley catheter Currently further plan is continue monitoring for improvement in diet tolerance.  Assessment and Plan: 1. SBO (small bowel obstruction) (HCC) S/P exploratory laparotomy on 06/25/2016 with lysis of adhesion. Postoperative ileus.  NG tube removed. Clear liquids tolerating. Diet is advance to full liquid. Patient does complain of some gas pain with start her on simethicone scheduled. Appreciate surgical consultation. We'll continue to follow their recommendation. Recommend out of bed to chair every 4 hours with ambulation. Nursing instructed. Would continue on Reglan 5 mg today and likely stop on 07/07/2016. Patient has mild fluid overload and is currently correcting her volume status by herself, Avoid giving diuretics.  2. Emphysematous cystitis, UTI. Completed 7 days of IV Rocephin currently getting better. Continue to monitor.  3. Urinary retention. Required Foley catheter insertion twice  during the hospitalization. If the patient requires reinsertion of Foley catheter, urology will be consulted.  4. Essential hypertension. Continue when necessary hydralazine.  5. Hypomagnesemia, resolved. Hypokalemia currently resolved.  Daily monitoring of magnesium.  6. Moderate protein calorie malnutrition. Since her diet is advance to full liquid expect improvement with supplementation.  7. Bilateral lower extremity swelling. Likely from hydration as well as immobilization. No evidence of DVT at present. We will use compression stocking. Encourage mobility.  Pain management: When necessary morphine Activity: Consulted physical therapy, recommended SNF Bowel regimen: last BM 07/06/2016 Diet: Full liquid diet DVT Prophylaxis: subcutaneous Heparin  Advance goals of care discussion: Full code  Family Communication: no family was present at bedside, at the time of interview.   Disposition:  Discharge to SNF. Expected discharge date: 07/09/2016, pending improvement in diet tolerance as well as bowel movements  Consultants: Gen. surgery,  Procedures: S/P exploratory laparotomy  Antibiotics: Anti-infectives    Start     Dose/Rate Route Frequency Ordered Stop   06/25/16 0900  ceFAZolin (ANCEF) IVPB 2g/100 mL premix     2 g 200 mL/hr over 30 Minutes Intravenous  Once 06/25/16 0839 06/25/16 1315   06/20/16 1300  metroNIDAZOLE (FLAGYL) IVPB 500 mg  Status:  Discontinued     500 mg 100 mL/hr over 60 Minutes Intravenous Every 8 hours 06/20/16 1203 06/26/16 1454   06/20/16 1000  cefTRIAXone (ROCEPHIN) 1 g in dextrose 5 % 50 mL IVPB  Status:  Discontinued     1 g 100 mL/hr over 30 Minutes Intravenous Every 24 hours 06/19/16 1254 06/26/16 1454   06/19/16 1100  cefTRIAXone (ROCEPHIN) 1 g in dextrose 5 % 50 mL IVPB     1 g 100 mL/hr over 30 Minutes Intravenous  Once 06/19/16 1052 06/19/16 1129        Intake/Output Summary (Last 24 hours)  at 07/06/16 1902 Last data filed at  07/06/16 1800  Gross per 24 hour  Intake             2040 ml  Output             2450 ml  Net             -410 ml   Filed Weights   07/04/16 0600 07/05/16 0700 07/06/16 0500  Weight: 65.2 kg (143 lb 11.8 oz) 68.7 kg (151 lb 7.3 oz) 71.6 kg (157 lb 13.6 oz)    Objective: Physical Exam: Vitals:   07/05/16 2200 07/06/16 0500 07/06/16 0535 07/06/16 1350  BP: (!) 157/57  (!) 150/67 (!) 146/72  Pulse: 86  79 85  Resp: 18  18 16   Temp: 98.4 F (36.9 C)  97.4 F (36.3 C) 98.2 F (36.8 C)  TempSrc: Oral  Oral Oral  SpO2: 96%  98% 94%  Weight:  71.6 kg (157 lb 13.6 oz)    Height:        General: Alert, Awake and Oriented to Time, Place and Person. Appear in mild distress Eyes: PERRL, Conjunctiva normal ENT: Oral Mucosa clear moist. Neck: no JVD, no Abnormal Mass Or lumps Cardiovascular: S1 and S2 Present, no Murmur, Respiratory: Bilateral Air entry equal and Decreased, Clear to Auscultation, no Crackles, no wheezes Abdomen: Bowel Sound present, Soft and mild tenderness Skin: no redness, no Rash  Extremities: bilateral Pedal edema, no calf tenderness Neurologic: Grossly no focal neuro deficit. Bilaterally Equal motor strength  Data Reviewed: CBC:  Recent Labs Lab 07/01/16 0605 07/02/16 0431 07/03/16 0659 07/04/16 0534 07/05/16 0433 07/06/16 0439  WBC 12.5* 13.8* 11.1* 12.6* 10.4 8.3  NEUTROABS 10.1*  --  9.0* 10.2* 8.0* 5.7  HGB 12.2 12.5 11.9* 11.8* 11.3* 11.4*  HCT 38.0 39.0 37.3 36.2 35.3* 35.1*  MCV 87.8 87.1 87.6 85.6 85.5 85.0  PLT 365 409* 370 324 308 [id number]   Basic Metabolic Panel:  Recent Labs Lab 07/01/16 0605  07/03/16 0659 07/03/16 1645 07/04/16 0534 07/05/16 0433 07/06/16 0439  NA 144  < > 143 140 141 140 138  K 3.1*  < > 2.9* 3.9 3.9 4.0 4.0  CL 108  < > 105 104 104 106 105  CO2 27  < > 35* 31 34* 30 28  GLUCOSE 92  < > 118* 120* 124* 122* 113*  BUN 9  < > <5* <5* <5* <5* <5*  CREATININE 0.48  < > 0.40* 0.40* 0.45 0.44 0.46  CALCIUM 7.8*  < >  7.8* 7.8* 8.0* 7.7* 8.0*  MG 1.9  --  1.6*  --  2.0 1.9 1.9  < > = values in this interval not displayed.  Liver Function Tests:  Recent Labs Lab 07/01/16 0605 07/03/16 0659 07/04/16 0534 07/05/16 0433 07/06/16 0439  AST 14* 21 23 25 26   ALT 15 19 21 23 24   ALKPHOS 33* 41 47 51 54  BILITOT 0.9 0.4 0.4 0.5 0.3  PROT 4.3* 4.3* 4.6* 4.4* 4.8*  ALBUMIN 2.1* 2.2* 2.3* 2.2* 2.2*   No results for input(s): LIPASE, AMYLASE in the last 168 hours. No results for input(s): AMMONIA in the last 168 hours. Coagulation Profile: No results for input(s): INR, PROTIME in the last 168 hours. Cardiac Enzymes: No results for input(s): CKTOTAL, CKMB, CKMBINDEX, TROPONINI in the last 168 hours. BNP (last 3 results) No results for input(s): PROBNP in the last 8760 hours.  CBG: No results for input(s): GLUCAP in  the last 168 hours.  Studies: No results found.   Scheduled Meds: . chlorhexidine  15 mL Mouth Rinse BID  . enoxaparin (LOVENOX) injection  40 mg Subcutaneous Q24H  . famotidine (PEPCID) IV  20 mg Intravenous Q12H  . lip balm  1 application Topical BID  . mouth rinse  15 mL Mouth Rinse q12n4p  . metoCLOPramide (REGLAN) injection  5 mg Intravenous Q8H  . nicotine  14 mg Transdermal Daily  . phenazopyridine  100 mg Oral TID WC  . polyethylene glycol  17 g Oral Daily  . simethicone  80 mg Oral QID  . sorbitol, milk of mag, mineral oil, glycerin (SMOG) enema  960 mL Rectal Once  . tamsulosin  0.4 mg Oral Daily   Continuous Infusions:   PRN Meds: alum & mag hydroxide-simeth, hydrALAZINE, magic mouthwash, menthol-cetylpyridinium, morphine injection, ondansetron **OR** ondansetron (ZOFRAN) IV, phenol, prochlorperazine  Time spent: 30 minutes  Author: Berle Mull, MD [facility] Pager: [phone] 07/06/2016 7:02 PM  If 7PM-7AM, please contact night-coverage at [email or web address], password [facility]

## 2016-07-07 DIAGNOSIS — E876 Hypokalemia: Secondary | ICD-10-CM

## 2016-07-07 DIAGNOSIS — R339 Retention of urine, unspecified: Secondary | ICD-10-CM

## 2016-07-07 DIAGNOSIS — K5669 Other intestinal obstruction: Secondary | ICD-10-CM

## 2016-07-07 DIAGNOSIS — N3080 Other cystitis without hematuria: Secondary | ICD-10-CM

## 2016-07-07 DIAGNOSIS — K913 Postprocedural intestinal obstruction: Secondary | ICD-10-CM

## 2016-07-07 DIAGNOSIS — E440 Moderate protein-calorie malnutrition: Secondary | ICD-10-CM

## 2016-07-07 DIAGNOSIS — I10 Essential (primary) hypertension: Secondary | ICD-10-CM

## 2016-07-07 DIAGNOSIS — K56609 Unspecified intestinal obstruction, unspecified as to partial versus complete obstruction: Secondary | ICD-10-CM

## 2016-07-07 DIAGNOSIS — Z720 Tobacco use: Secondary | ICD-10-CM

## 2016-07-07 LAB — COMPREHENSIVE METABOLIC PANEL
ALK PHOS: 60 U/L (ref 38–126)
ALT: 25 U/L (ref 14–54)
AST: 26 U/L (ref 15–41)
Albumin: 2.1 g/dL — ABNORMAL LOW (ref 3.5–5.0)
Anion gap: 6 (ref 5–15)
CALCIUM: 8.1 mg/dL — AB (ref 8.9–10.3)
CO2: 30 mmol/L (ref 22–32)
CREATININE: 0.51 mg/dL (ref 0.44–1.00)
Chloride: 104 mmol/L (ref 101–111)
Glucose, Bld: 111 mg/dL — ABNORMAL HIGH (ref 65–99)
Potassium: 3.4 mmol/L — ABNORMAL LOW (ref 3.5–5.1)
Sodium: 140 mmol/L (ref 135–145)
Total Bilirubin: 0.6 mg/dL (ref 0.3–1.2)
Total Protein: 4.7 g/dL — ABNORMAL LOW (ref 6.5–8.1)

## 2016-07-07 LAB — CBC WITH DIFFERENTIAL/PLATELET
Basophils Absolute: 0 10*3/uL (ref 0.0–0.1)
Basophils Relative: 0 %
Eosinophils Absolute: 0 10*3/uL (ref 0.0–0.7)
Eosinophils Relative: 0 %
HEMATOCRIT: 33.1 % — AB (ref 36.0–46.0)
HEMOGLOBIN: 10.6 g/dL — AB (ref 12.0–15.0)
LYMPHS ABS: 1.6 10*3/uL (ref 0.7–4.0)
LYMPHS PCT: 23 %
MCH: 27.1 pg (ref 26.0–34.0)
MCHC: 32 g/dL (ref 30.0–36.0)
MCV: 84.7 fL (ref 78.0–100.0)
Monocytes Absolute: 0.9 10*3/uL (ref 0.1–1.0)
Monocytes Relative: 13 %
NEUTROS ABS: 4.5 10*3/uL (ref 1.7–7.7)
NEUTROS PCT: 64 %
Platelets: 309 10*3/uL (ref 150–400)
RBC: 3.91 MIL/uL (ref 3.87–5.11)
RDW: 15.9 % — ABNORMAL HIGH (ref 11.5–15.5)
WBC: 7 10*3/uL (ref 4.0–10.5)

## 2016-07-07 LAB — MAGNESIUM: MAGNESIUM: 1.7 mg/dL (ref 1.7–2.4)

## 2016-07-07 MED ORDER — ENSURE ENLIVE PO LIQD: 237.0000 mL | Freq: Two times a day (BID) | ORAL | 12 refills | Status: DC

## 2016-07-07 MED ORDER — HYDROCODONE-ACETAMINOPHEN 5-325 MG PO TABS: 1.0000 | ORAL_TABLET | Freq: Four times a day (QID) | ORAL | 0 refills | Status: DC | PRN

## 2016-07-07 MED ORDER — FAMOTIDINE 20 MG PO TABS: 20.0000 mg | ORAL_TABLET | Freq: Two times a day (BID) | ORAL | 0 refills | Status: DC

## 2016-07-07 MED ORDER — TAMSULOSIN HCL 0.4 MG PO CAPS: 0.4000 mg | ORAL_CAPSULE | Freq: Every day | ORAL | 0 refills | Status: DC

## 2016-07-07 MED ORDER — POTASSIUM CHLORIDE CRYS ER 20 MEQ PO TBCR
40.0000 meq | EXTENDED_RELEASE_TABLET | Freq: Once | ORAL | Status: AC
  Administered 2016-07-07: 40 meq via ORAL
  Filled 2016-07-07: qty 2

## 2016-07-07 MED ORDER — PSYLLIUM 95 % PO PACK: 1.0000 | PACK | Freq: Every day | ORAL | 0 refills | Status: DC

## 2016-07-07 MED ORDER — METOCLOPRAMIDE HCL 5 MG PO TABS: 5.0000 mg | ORAL_TABLET | Freq: Three times a day (TID) | ORAL | 0 refills | Status: DC | PRN

## 2016-07-07 MED ORDER — ENSURE ENLIVE PO LIQD: 237.0000 mL | Freq: Two times a day (BID) | ORAL | Status: DC

## 2016-07-07 MED ORDER — POLYETHYLENE GLYCOL 3350 17 G PO PACK: 17.0000 g | PACK | Freq: Every day | ORAL | 0 refills | Status: DC

## 2016-07-07 MED ORDER — PSYLLIUM 95 % PO PACK: 1.0000 | PACK | Freq: Every day | ORAL | Status: DC

## 2016-07-07 MED ORDER — NICOTINE 14 MG/24HR TD PT24: 14.0000 mg | MEDICATED_PATCH | Freq: Every day | TRANSDERMAL | 0 refills | Status: DC

## 2016-07-07 NOTE — Progress Notes (Signed)
Patient had staples removed and steri strips placed over incision site. Patient tolerated procedure well.

## 2016-07-07 NOTE — Progress Notes (Signed)
Nutrition Follow-up  DOCUMENTATION CODES:   Non-severe (moderate) malnutrition in context of acute illness/injury  INTERVENTION:  Ensure Enlive po BID, each supplement provides 350 kcal and 20 grams of protein.   Recommended small, frequent meals with emphasis on adequate protein and calories.   NUTRITION DIAGNOSIS:   Inadequate oral intake related to inability to eat as evidenced by NPO status.  Improved with advancement to soft diet - intake still inadequate.  GOAL:   Patient will meet greater than or equal to 90% of their needs  Not meeting. Will address with Ensure Enlive BID.  MONITOR:   Diet advancement, Labs, Weight trends, I & O's  REASON FOR ASSESSMENT:   LOS, NPO/Clear Liquid Diet (x 9 days)    ASSESSMENT:   75 y.o. female with medical history significant of no significant past medical history the comes into the hospital for suprapubic pain that started on the day of admission, she related this or carotid on 1 AM the pain is constant nothing makes it better or worse, she relates episodes of vomiting, relates no diarrhea no fevers.  Plan for discharge home today.  Diet advanced to full liquids on 9/3 and soft on 9/5. Pt reports she has been tolerating diet in the sense that everything is "staying down," but reports early satiety.   She has only been eating 50% of meals. For lunch yesterday she had scrambled eggs, 1/2 biscuit, and 1 sausage patty. For dinner she had roast [place], green beans, and mashed potatoes. For breakfast today she had [place] toast, 1 sausage patty, and coffee. She also had 2 puddings. This provides 1058 kcal (71% of minimal needs), and 35 grams protein (54% of minimal needs).  Medications reviewed and include: Pepcid, Reglan, Miralax, Metamucil.  Labs reviewed: Glucose 113.  Discussed plan with RN.  Reviewed post discharge nutrition with patient. Encouraged her to drink Ensure Enlive BID for at least 1 month. Discussed importance of  adequate protein and calories.  Diet Order:  DIET SOFT Room service appropriate? Yes; Fluid consistency: Thin  Skin:  Reviewed, no issues  Last BM:  8/18  Height:   Ht Readings from Last 1 Encounters:  06/28/16 5\' 5"  (1.651 m)    Weight:   Wt Readings from Last 1 Encounters:  07/07/16 151 lb 3.8 oz (68.6 kg)    Ideal Body Weight:  56.8 kg  BMI:  Body mass index is 25.17 kg/m.  Estimated Nutritional Needs:   Kcal:  1500-1700  Protein:  65-75g  Fluid:  1.7L/day  EDUCATION NEEDS:   No education needs identified at this time  Willey Blade, MS, RD, LDN Pager: [phone] After Hours Pager: [phone]

## 2016-07-07 NOTE — Progress Notes (Signed)
Patient alert and oriented with pain controlled. Patient able to tolerate diet and ambulate in halls with front wheel walker, VSS. Patient and son given discharge instructions and prescriptions. Patient given reminder about follow up appointments. Case management spoke to patient and son in regards to home care and availability of SNF after discharge if needed. Patient made aware that [facility] would call with follow up for home appointments. Patient and son verbalized understanding of discharge instructions and home care. All questions and concerns answered.

## 2016-07-07 NOTE — Progress Notes (Signed)
12 Days Post-Op  Subjective: She looks fine, still infrequent BM's, but no nausea or vomiting with the PO's.  Tolerating soft diet.  Hopefully with more ambulation she will also do better.  She reports going home with home health.  + BS and + BM.  Staple line looks good, I will have staples removed today.    Objective: Vital signs in last 24 hours: Temp:  [97.9 F (36.6 C)-98.2 F (36.8 C)] 97.9 F (36.6 C) (09/06 0626) Pulse Rate:  [80-85] 80 (09/06 0626) Resp:  [16] 16 (09/06 0626) BP: (128-151)/(72-91) 128/91 (09/06 0626) SpO2:  [93 %-95 %] 95 % (09/06 0626) Weight:  [68.6 kg (151 lb 3.8 oz)] 68.6 kg (151 lb 3.8 oz) (09/06 0500) Last BM Date: 07/06/16 BM x 1 PO 600 Afebrile, VSS K+ 3.4, other labs OK    Intake/Output from previous day: 09/05 0701 - 09/06 0700 In: 750 [P.O.:600; IV Piggyback:150] Out: 1000 [Urine:1000] Intake/Output this shift: No intake/output data recorded.  General appearance: alert, cooperative and no distress GI: soft, not really sore, + BS, no distension, staple line looks good.  + BM  Lab Results:   Recent Labs  07/06/16 0439 07/07/16 0455  WBC 8.3 7.0  HGB 11.4* 10.6*  HCT 35.1* 33.1*  PLT 309 309    BMET  Recent Labs  07/06/16 0439 07/07/16 0455  NA 138 140  K 4.0 3.4*  CL 105 104  CO2 28 30  GLUCOSE 113* 111*  BUN <5* <5*  CREATININE 0.46 0.51  CALCIUM 8.0* 8.1*   PT/INR No results for input(s): LABPROT, INR in the last 72 hours.   Recent Labs Lab 07/03/16 0659 07/04/16 0534 07/05/16 0433 07/06/16 0439 07/07/16 0455  AST 21 23 25 26 26   ALT 19 21 23 24 25   ALKPHOS 41 47 51 54 60  BILITOT 0.4 0.4 0.5 0.3 0.6  PROT 4.3* 4.6* 4.4* 4.8* 4.7*  ALBUMIN 2.2* 2.3* 2.2* 2.2* 2.1*     Lipase     Component Value Date/Time   LIPASE 24 06/19/2016 0739     Studies/Results: No results found.  Medications: . chlorhexidine  15 mL Mouth Rinse BID  . enoxaparin (LOVENOX) injection  40 mg Subcutaneous Q24H  .  famotidine (PEPCID) IV  20 mg Intravenous Q12H  . lip balm  1 application Topical BID  . mouth rinse  15 mL Mouth Rinse q12n4p  . metoCLOPramide (REGLAN) injection  5 mg Intravenous Q8H  . nicotine  14 mg Transdermal Daily  . phenazopyridine  100 mg Oral TID WC  . polyethylene glycol  17 g Oral Daily  . simethicone  80 mg Oral QID  . sorbitol, milk of mag, mineral oil, glycerin (SMOG) enema  960 mL Rectal Once  . tamsulosin  0.4 mg Oral Daily    Assessment/Plan SBO S/p exploratory laparotomy, with lysis of adhesions, 06/25/16, Dr. Coralie Keens POD 12 Emphysematous cystitis - 7 days of Rocephin Completed on 05/25/16  Post op ileus - Reglan started 07/03/16 Tobacco use Hypertension Hypokalemia - adding additional K+ to IV fluids FEN: soft diet/IV fluids ID: No further antibiotics [id number]    Plan:  Dr. Clementeen Graham is ready to discharge and I think she can go. I would put her on fiber supplement here and continue this at home.  The more she walks the better.  I will put follow up information in the AVS.  Staples out today, she will get steri strips and can shower tomorrow.  LOS: 18 days    Gaege Sangalang 07/07/2016 [phone]

## 2016-07-07 NOTE — Discharge Instructions (Signed)
ABDOMINAL SURGERY: POST OP INSTRUCTIONS  1. DIET: Follow a light bland diet the first 24 hours after arrival home, such as soup, liquids, crackers, etc.  Be sure to include lots of fluids daily.  Avoid fast food or heavy meals as your are more likely to get nauseated.  Eat a low fat the next few days after surgery.   2. Take your usually prescribed home medications unless otherwise directed. 3. PAIN CONTROL: a. Pain is best controlled by a usual combination of three different methods TOGETHER: i. Ice/Heat ii. Over the counter pain medication iii. Prescription pain medication b. Most patients will experience some swelling and bruising around the incisions.  Ice packs or heating pads (30-60 minutes up to 6 times a day) will help. Use ice for the first few days to help decrease swelling and bruising, then switch to heat to help relax tight/sore spots and speed recovery.  Some people prefer to use ice alone, heat alone, alternating between ice & heat.  Experiment to what works for you.  Swelling and bruising can take several weeks to resolve.   c. It is helpful to take an over-the-counter pain medication regularly for the first few weeks.  Choose one of the following that works best for you: i. Naproxen (Aleve, etc)  Two 228m tabs twice a day ii. Ibuprofen (Advil, etc) Three 2071mtabs four times a day (every meal & bedtime) iii. Acetaminophen (Tylenol, etc) 500-65061mour times a day (every meal & bedtime) d. A  prescription for pain medication (such as oxycodone, hydrocodone, etc) should be given to you upon discharge.  Take your pain medication as prescribed.  i. If you are having problems/concerns with the prescription medicine (does not control pain, nausea, vomiting, rash, itching, etc), please call us [phone] see if we need to switch you to a different pain medicine that will work better for you and/or control your side effect better. ii. If you need a refill on your pain medication,  please contact your pharmacy.  They will contact our office to request authorization. Prescriptions will not be filled after 5 pm or on week-ends. 4. Avoid getting constipated.  Between the surgery and the pain medications, it is common to experience some constipation.  Increasing fluid intake and taking a fiber supplement (such as Metamucil, Citrucel, FiberCon, MiraLax, etc) 1-2 times a day regularly will usually help prevent this problem from occurring.  A mild laxative (prune juice, Milk of Magnesia, MiraLax, etc) should be taken according to package directions if there are no bowel movements after 48 hours.   5. Watch out for diarrhea.  If you have many loose bowel movements, simplify your diet to bland foods & liquids for a few days.  Stop any stool softeners and decrease your fiber supplement.  Switching to mild anti-diarrheal medications (Kayopectate, Pepto Bismol) can help.  If this worsens or does not improve, please call us.[place]. Wash / shower every day.  You may shower over the incision / wound.  Avoid baths until the skin is fully healed.  Continue to shower over incision(s) after the dressing is off. 7. Remove your waterproof bandages 5 days after surgery.  You may leave the incision open to air.  Remove any wicks or ribbons in your wound.  If you have an open wound, please see wound care instructions. You may replace a dressing/Band-Aid to cover the incision for comfort if you wish. 8. ACTIVITIES as tolerated:   a. You may resume regular (light)  daily activities beginning the next day--such as daily self-care, walking, climbing stairs--gradually increasing activities as tolerated.  If you can walk 30 minutes without difficulty, it is safe to try more intense activity such as jogging, treadmill, bicycling, low-impact aerobics, swimming, etc. °b. Save the most intensive and strenuous activity for last such as sit-ups, heavy lifting, contact sports, etc  Refrain from any heavy lifting or straining  until you are off narcotics for pain control.   °c. DO NOT PUSH THROUGH PAIN.  Let pain be your guide: If it hurts to do something, don't do it.  Pain is your body warning you to avoid that activity for another week until the pain goes down. °d. You may drive when you are no longer taking prescription pain medication, you can comfortably wear a seatbelt, and you can safely maneuver your car and apply brakes. °e. You may have sexual intercourse when it is comfortable.  °9. FOLLOW UP in our office °a. Please call [facility] at [phone] to set up an appointment to see your surgeon in the office for a follow-up appointment approximately 1-2 weeks after your surgery. °b. Make sure that you call for this appointment the day you arrive home to insure a convenient appointment time. °10. IF YOU HAVE DISABILITY OR FAMILY LEAVE FORMS, BRING THEM TO THE OFFICE FOR PROCESSING.  DO NOT GIVE THEM TO YOUR DOCTOR. ° ° °WHEN TO CALL US [phone]: °1. Poor pain control °2. Reactions / problems with new medications (rash/itching, nausea, etc)  °3. Fever over 101.5 F (38.5 C) °4. Inability to urinate °5. Nausea and/or vomiting °6. Worsening swelling or bruising °7. Continued bleeding from incision. °8. Increased pain, redness, or drainage from the incision ° °The clinic staff is available to answer your questions during regular business hours (8:30am-5pm).  Please don’t hesitate to call and ask to speak to one of our nurses for clinical concerns.   A surgeon from [facility] is always on call at the hospitals °  °If you have a medical emergency, go to the nearest emergency room or call 911. °  ° °[facility] °[address], [address], [place], [place]  [address] ? °MAIN: [phone] ? TOLL FREE: [phone] ? °FAX [phone] °[email or web address] ° ° ° °

## 2016-07-07 NOTE — Progress Notes (Signed)
Physical Therapy Treatment Patient Details Name: Brenda Gibson MRN: [id number] DOB: [birth year] Today's Date: 07/07/2016    History of Present Illness , S/P exploratory lap for SBO on 06/25/16, post op ileus.     PT Comments    Pt D/C to home today appeared nervous about it.  Addressed her many questions on activity level, safety and toileting.  Assisted from recliner to [facility] to bed only due to Tx session shortened bt Nursing Students needing to remove ABD staples.  RW and 3:1 delivered and in room..   Follow Up Recommendations  Home health PT     Equipment Recommendations  Rolling walker with 5" wheels;3in1 (PT)    Recommendations for Other Services       Precautions / Restrictions Precautions Precautions: Fall Restrictions Weight Bearing Restrictions: No    Mobility  Bed Mobility Overal bed mobility: Needs Assistance         Sit to supine: Min assist   General bed mobility comments: assisted with B LE up onto bed due to "heaviness" B LE edema  Transfers Overall transfer level: Needs assistance Equipment used: Rolling walker (2 wheeled) Transfers: Sit to/from [facility] Sit to Stand: Supervision Stand pivot transfers: Supervision       General transfer comment: good use of B UE's to support self.    Ambulation/Gait             General Gait Details: Tx session shortened by student nurses needing to remove surgical staple from pt ABD so did not amb   Stairs            Wheelchair Mobility    Modified Rankin (Stroke Patients Only)       Balance                                    Cognition Arousal/Alertness: Awake/alert Behavior During Therapy: WFL for tasks assessed/performed Overall Cognitive Status: Within Functional Limits for tasks assessed                      Exercises      General Comments        Pertinent Vitals/Pain Pain Assessment: No/denies pain    Home Living                       Prior Function            PT Goals (current goals can now be found in the care plan section) Progress towards PT goals: Progressing toward goals    Frequency  Min 3X/week    PT Plan Current plan remains appropriate    Co-evaluation             End of Session   Activity Tolerance: Patient tolerated treatment well Patient left: in bed;with nursing/sitter in room     Time: 1135-1147 PT Time Calculation (min) (ACUTE ONLY): 12 min  Charges:  $Therapeutic Activity: 8-22 mins                    G Codes:      Rica Koyanagi  PTA [facility]  Acute  Rehab Pager      [phone]

## 2016-07-07 NOTE — Plan of Care (Signed)
Problem: Inadequate Intake (NI-2.1) Goal: Food and/or nutrient delivery Individualized approach for food/nutrient provision.  Outcome: Completed/Met Date Met: 07/07/16 Nutrition Post Hospital Stay Proper nutrition can help your body recover from illness and injury.   Foods and beverages high in protein, vitamins, and minerals help rebuild muscle loss, promote healing, & reduce fall risk.   .In addition to eating healthy foods, a nutrition shake is an easy, delicious way to get the nutrition you need during and after your hospital stay  It is recommended that you continue to drink 2 bottles per day of: Ensure Enlive for at least 1 month (30 days) after your hospital stay   Tips for adding a nutrition shake into your routine: As allowed, drink one with vitamins or medications instead of water or juice Enjoy one as a tasty mid-morning or afternoon snack Drink cold or make a milkshake out of it Drink one instead of milk with cereal or snacks Use as a coffee creamer   Available at the following grocery stores and pharmacies:           * [employer] * [employer] * [employer]  * [employer]          * [employer] * [employer]  * Wal[facility]reens      * Target  * [employer]'s   * [facility]  * [employer]   * [facility] [phone]            For COUPONS visit: [email or web address] or [email or web address]   Suggested Substitutions Ensure Plus = Boost Plus = Carnation Breakfast Essentials = Boost Compact Ensure Active Clear = Boost Breeze Glucerna Shake = Boost Glucose Control = Carnation Breakfast Essentials SUGAR FREE       

## 2016-07-07 NOTE — Discharge Summary (Signed)
Physician Discharge Summary  Brenda Gibson [id number] DOB: Dec 02, 1940 DOA: 06/19/2016  PCP: [name], FNP  Admit date: 06/19/2016 Discharge date: 07/07/2016  Admitted From: home Disposition:  home  Recommendations for Outpatient Follow-up:  1. Follow up with PCP in 1 weeks 2. Follow up with surgery in 2 weeks . appt scheduled  Home Health: PT/ RN and HH aide Equipment/Devices: none  Discharge Condition: stable CODE STATUS: full code Diet recommendation: soft   Discharge Diagnoses:  Principal Problem:   SBO (small bowel obstruction) s/p lysis of adhesion 06/25/2016   Active Problems:   Tobacco abuse   Essential hypertension, benign   Emphysematous cystitis   Enteritis   Malnutrition of moderate degree   Ileus, postoperative   Hypokalemia   Hypomagnesemia   Urinary retention   Brief narrative Please refer to admission H&P for details, in brief, 75 y/o female with  Hx of hypertension; admitted on 06/19/2016, with complaint of abdominal pain, was found to have emphysematous cystitis and  small bowel obstruction . Patient had prolonged hospitalization, with initial conservative management followed by surgery with  lysis of adhesion  followed by postoperative ileus. Also developed urinary retention in the hospital requiring Foley catheter.   Hospital course: SBO (small bowel obstruction) (HCC) S/P exploratory laparotomy on 06/25/2016 with lysis of adhesion. Postoperative ileus. Now resolved.a tolerating soft diet. Seen by PT and recommends HH. Pt can be discharged home with outpt follow up with PCP and surgery. Will prescribe a short course of pain meds and antiemetics.  Emphysematous cystitis/ UTI. Completed 7 days of IV Rocephin     Urinary retention. Required Foley catheter insertion twice during the hospitalization. Added flomax. Now voiding without difficulty off foley.      Hypomagnesemia/ Hypokalemia  replenished     Moderate protein  calorie malnutrition. Added supplements. Tolerating advanced diet.   Bilateral lower extremity swelling. Possibly hypoalbuminemia with malnutrition. Now improved.  Discharge Instructions     Medication List    TAKE these medications   famotidine 20 MG tablet Commonly known as:  PEPCID Take 1 tablet (20 mg total) by mouth 2 (two) times daily.   feeding supplement (ENSURE ENLIVE) Liqd Take 237 mLs by mouth 2 (two) times daily between meals.   HYDROcodone-acetaminophen 5-325 MG tablet Commonly known as:  NORCO/VICODIN Take 1 tablet by mouth every 6 (six) hours as needed for moderate pain.   metoCLOPramide 5 MG tablet Commonly known as:  REGLAN Take 1 tablet (5 mg total) by mouth every 8 (eight) hours as needed for nausea or vomiting.   nicotine 14 mg/24hr patch Commonly known as:  NICODERM CQ - dosed in mg/24 hours Place 1 patch (14 mg total) onto the skin daily.   polyethylene glycol packet Commonly known as:  MIRALAX / GLYCOLAX Take 17 g by mouth daily.   psyllium 95 % Pack Commonly known as:  HYDROCIL/METAMUCIL Take 1 packet by mouth at bedtime.   tamsulosin 0.4 MG Caps capsule Commonly known as:  FLOMAX Take 1 capsule (0.4 mg total) by mouth daily.   trifluoperazine 5 MG tablet Commonly known as:  STELAZINE Take 1 tablet by mouth every evening.      Follow-up [facility] .   Why:  rolling walker and 3n1 Contact information: [place] [address] [place] .   Why:  physical therapy Contact information: [address]  [place] [address] [phone]        Harl Bowie, MD .   Specialty:  General Surgery Why:  Call and make an appointment in 2 weeks. Contact information: [address] [place] [address] [phone]        [name], FNP. Schedule an appointment as soon as possible for a visit in 1 week(s).    Specialty:  Family Medicine Contact information: [address] [place] [address] [phone]          No Known Allergies  Consultations:  CCS   Procedures/Studies: Dg Abd 1 View  Result Date: 06/24/2016 CLINICAL DATA:  Small bowel obstruction EXAM: ABDOMEN - 1 VIEW COMPARISON:  Supine abdominal radiograph June 22, 2016 FINDINGS: There remain loops of moderately distended gas-filled small bowel in the upper and mid abdomen. There is no significant colonic gas. There is some stool in the right colon. There is no rectal gas. There are no free extraluminal gas collections. The esophagogastric tube tip in proximal port project in the gastric body. IMPRESSION: Persistent mid to distal small bowel obstruction. No evidence of perforation. Electronically Signed   By: David  Martinique M.D.   On: 06/24/2016 09:09   Dg Abd 1 View  Result Date: 06/22/2016 CLINICAL DATA:  Nasogastric tube placement. EXAM: ABDOMEN - 1 VIEW COMPARISON:  Radiograph of June 22, 2016. FINDINGS: Nasogastric tube tip is seen in proximal stomach. There is continued and stable presence of dilated small bowel loops concerning for distal small bowel obstruction. No abnormal calcifications are noted. IMPRESSION: Stable small bowel dilatation concerning for distal small bowel obstruction. Nasogastric tube tip seen in proximal stomach. Electronically Signed   By: Marijo Conception, M.D.   On: 06/22/2016 20:40   Dg Abd 1 View  Result Date: 06/21/2016 CLINICAL DATA:  General abdominal pain and distension EXAM: ABDOMEN - 1 VIEW COMPARISON:  06/19/2016 FINDINGS: Scattered large and small bowel gas is noted. Multiple dilated loops of small bowel are noted consistent with a small-bowel obstruction. This has increased in the interval from the prior exam. IMPRESSION: Increased small bowel dilatation consistent with at least partial small bowel obstruction. Electronically Signed   By: Inez Catalina M.D.   On: 06/21/2016 14:31   Ct  Abdomen Pelvis W Contrast  Result Date: 06/19/2016 CLINICAL DATA:  Abdominal pain with nausea and vomiting for 1 day EXAM: CT ABDOMEN AND PELVIS WITH CONTRAST TECHNIQUE: Multidetector CT imaging of the abdomen and pelvis was performed using the standard protocol following bolus administration of intravenous contrast. Oral contrast was also administered. CONTRAST:  13mL ISOVUE-300 IOPAMIDOL (ISOVUE-300) INJECTION 61% COMPARISON:  Abdomen series June 19, 2016 FINDINGS: Lower chest: There is focal atelectatic change in the posterior right lung base region. Lung bases otherwise are clear. A small amount of gastroesophageal reflux is noted into the distal esophagus. Hepatobiliary: There is hepatic steatosis. No focal liver lesions are evident. There is a gallstone within the gallbladder. The gallbladder wall is not thickened. There is no biliary duct dilatation. Pancreas: There is no pancreatic mass or inflammatory focus. Spleen: No splenic lesions are evident. Adrenals/Urinary Tract: Adrenals appear normal bilaterally. There is no appreciable renal mass or hydronephrosis on either side. There is no renal or ureteral calculus on either side. Urinary bladder wall is normal in thickness. Urinary bladder is midline. A small focus of air is noted within the urinary bladder anteriorly. A mild degree of urinary bladder prolapse is evident. Stomach/Bowel: There are several loops of  proximal to mid jejunum which show mild wall thickening. There is no focal bowel obstruction evident on this study. No free air or portal venous air. Mesentery does not appear thickened. Vascular/Lymphatic: There is atherosclerotic calcification in the aorta and iliac arteries. There is no demonstrable abdominal aortic aneurysm. The major mesenteric vessels appear patent. There is no adenopathy in the abdomen or pelvis. Reproductive: Uterus appears absent. There is no pelvic mass or pelvic fluid collection. Other: There is no appendiceal region  lesion. No abscess is seen in the abdomen or pelvis. A small amount of ascites is noted in the dependent portion of the pelvis. No ascites noted elsewhere. Musculoskeletal: There is degenerative change in the lumbar spine and hip joint regions. There are no blastic or lytic bone lesions. There is no intramuscular or abdominal wall lesion. IMPRESSION: Small focus of air is noted in the urinary bladder. Unless the patient has had recent instrumentation in this area, infectious etiology is felt to be most likely for air within the urinary bladder. Urinary bladder wall is not thickened. No renal or ureteral calculi. No hydronephrosis. There is a small gallstone in the gallbladder. The gallbladder wall does not appear appreciably thickened. There is hepatic steatosis. There is mild wall thickening involving several loops of proximal to mid jejunum without bowel obstruction. Suspect a degree of jejunal enteritis. The surrounding mesenteric does not appear appreciably thickened. Mild ascites in the dependent portion of the pelvis. This finding may be due to the bowel inflammation noted in the left abdomen. Evidence of a degree of gastroesophageal reflux. Extensive aortic atherosclerosis. Evidence of a degree of urinary bladder prolapse. Electronically Signed   By: Lowella Grip III M.D.   On: 06/19/2016 09:22   Dg Abd Acute W/chest  Result Date: 07/01/2016 CLINICAL DATA:  Small obstruction, abdominal pain and distention, shortness of breath, weakness EXAM: DG ABDOMEN ACUTE W/ 1V CHEST COMPARISON:  Abdominal radiographs 06/25/2016, chest radiograph 06/19/2016 FINDINGS: Nasogastric tube coiled in stomach with tip at distal antrum. Normal heart size, mediastinal contours, and pulmonary vascularity. 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 Bibasilar small pleural effusions and atelectasis. Remaining lungs clear. No pneumothorax. Persistent dilatation small bowel loops in the mid abdomen. Largest of  these loops measures 5.4 cm diameter, little changed. Upper normal wall thickness of loops in LEFT mid abdomen. Retained contrast in colon. No free intraperitoneal air. Bones demineralized. Surgical clips from laparotomy and lysis adhesions on 06/25/2016. IMPRESSION: COPD changes with bibasilar pleural effusions and atelectasis. Persistent dilatation of small bowel loops in the mid abdomen, with some of the loops in the LEFT mid abdomen demonstrating mild bowel wall thickening. Interval laparotomy. Findings may represent postoperative ileus or persistent obstruction. Electronically Signed   By: Lavonia Dana M.D.   On: 07/01/2016 10:57   Dg Abdomen Acute W/chest  Result Date: 06/19/2016 CLINICAL DATA:  Abdominal pain with nausea and vomiting EXAM: DG ABDOMEN ACUTE W/ 1V CHEST COMPARISON:  None. FINDINGS: PA chest: No edema or consolidation. The heart size and pulmonary vascularity are normal. No adenopathy. Supine and upright abdomen: There is no appreciable bowel dilatation. There are occasional air-fluid levels on the left. No free air. There are phleboliths in the pelvis. IMPRESSION: Scattered air-fluid levels without bowel dilatation. This finding may be indicative of early ileus or enteritis. Obstruction is not felt to be likely. No free air. Lungs clear. Electronically Signed   By: Lowella Grip III M.D.   On: 06/19/2016 08:22   Dg Abd Portable 1v  Result Date: 06/25/2016 CLINICAL DATA:  Small-bowel obstruction. EXAM: PORTABLE ABDOMEN - 1 VIEW COMPARISON:  06/24/2016. FINDINGS: NG tube noted in stable position within the stomach. Persistent dilated loops of small bowel with a paucity of intra colonic gas again noted. Findings consistent with persistent small-bowel obstruction. No interim change. No free air. Bibasilar subsegmental atelectasis. IMPRESSION: 1. NG tube in stable position. 2. Persistent unchanged small bowel obstruction. Electronically Signed   By: Marcello Moores  Register   On: 06/25/2016 06:58    Dg Abd Portable 1v  Result Date: 06/22/2016 CLINICAL DATA:  Encounter for nasogastric tube. EXAM: PORTABLE ABDOMEN - 1 VIEW COMPARISON:  Earlier today FINDINGS: Nasogastric tube tip and side port are in the fundus of the stomach. No interval progression of oral contrast which is still in the stomach and proximal small bowel. Unchanged gaseous distention of bowel. No gross pneumoperitoneum in the supine position. Elevated left diaphragm. IMPRESSION: 1. Stable positioning of nasogastric tube with tip in the gastric fundus. 2. Small bowel obstruction with no progression of oral contrast since earlier today. Small bowel obstruction has progressed since admission CT 06/19/2016. Electronically Signed   By: Monte Fantasia M.D.   On: 06/22/2016 22:14   Dg Abd Portable 1v-small Bowel Obstruction Protocol-initial, 8 Hr Delay  Result Date: 06/22/2016 CLINICAL DATA:  Small bowel obstruction.  8 hour follow-up. EXAM: PORTABLE ABDOMEN - 1 VIEW COMPARISON:  06/21/2016 FINDINGS: There is an ongoing small bowel obstruction with similar degree of small bowel dilation, maximum diameter of 4.6 cm. No free intra-abdominal gas or intestinal pneumatosis on this limited radiograph. IMPRESSION: Ongoing small bowel obstruction. Electronically Signed   By: Fidela Salisbury M.D.   On: 06/22/2016 18:38       Subjective: Minimal abd discomfort. tolerating diet.  Discharge Exam: Vitals:   07/06/16 2043 07/07/16 0626  BP: (!) 151/78 (!) 128/91  Pulse: 81 80  Resp: 16 16  Temp: 98.2 F (36.8 C) 97.9 F (36.6 C)   Vitals:   07/06/16 1350 07/06/16 2043 07/07/16 0500 07/07/16 0626  BP: (!) 146/72 (!) 151/78  (!) 128/91  Pulse: 85 81  80  Resp: 16 16  16   Temp: 98.2 F (36.8 C) 98.2 F (36.8 C)  97.9 F (36.6 C)  TempSrc: Oral Axillary  Oral  SpO2: 94% 93%  95%  Weight:   68.6 kg (151 lb 3.8 oz)   Height:        General: thin built elderly female in NAD HEENT: no pallor, moist mucosa Cardiovascular:  NS1&S2, no murmurs Respiratory: clear b/l, no added sounds Abdominal: Soft, NT, ND, bowel sounds +, steri strips applied over surgical site. Extremities: trace edema CNS: alert and oriented    The results of significant diagnostics from this hospitalization (including imaging, microbiology, ancillary and laboratory) are listed below for reference.     Microbiology: No results found for this or any previous visit (from the past 240 hour(s)).   Labs: BNP (last 3 results) No results for input(s): BNP in the last 8760 hours. Basic Metabolic Panel:  Recent Labs Lab 07/03/16 0659 07/03/16 1645 07/04/16 0534 07/05/16 0433 07/06/16 0439 07/07/16 0455  NA 143 140 141 140 138 140  K 2.9* 3.9 3.9 4.0 4.0 3.4*  CL 105 104 104 106 105 104  CO2 35* 31 34* 30 28 30   GLUCOSE 118* 120* 124* 122* 113* 111*  BUN <5* <5* <5* <5* <5* <5*  CREATININE 0.40* 0.40* 0.45 0.44 0.46 0.51  CALCIUM 7.8* 7.8* 8.0* 7.7* 8.0*  8.1*  MG 1.6*  --  2.0 1.9 1.9 1.7   Liver Function Tests:  Recent Labs Lab 07/03/16 0659 07/04/16 0534 07/05/16 0433 07/06/16 0439 07/07/16 0455  AST 21 23 25 26 26   ALT 19 21 23 24 25   ALKPHOS 41 47 51 54 60  BILITOT 0.4 0.4 0.5 0.3 0.6  PROT 4.3* 4.6* 4.4* 4.8* 4.7*  ALBUMIN 2.2* 2.3* 2.2* 2.2* 2.1*   No results for input(s): LIPASE, AMYLASE in the last 168 hours. No results for input(s): AMMONIA in the last 168 hours. CBC:  Recent Labs Lab 07/03/16 0659 07/04/16 0534 07/05/16 0433 07/06/16 0439 07/07/16 0455  WBC 11.1* 12.6* 10.4 8.3 7.0  NEUTROABS 9.0* 10.2* 8.0* 5.7 4.5  HGB 11.9* 11.8* 11.3* 11.4* 10.6*  HCT 37.3 36.2 35.3* 35.1* 33.1*  MCV 87.6 85.6 85.5 85.0 84.7  PLT 370 324 308 309 309   Cardiac Enzymes: No results for input(s): CKTOTAL, CKMB, CKMBINDEX, TROPONINI in the last 168 hours. BNP: Invalid input(s): POCBNP CBG: No results for input(s): GLUCAP in the last 168 hours. D-Dimer No results for input(s): DDIMER in the last 72  hours. Hgb A1c No results for input(s): HGBA1C in the last 72 hours. Lipid Profile No results for input(s): CHOL, HDL, LDLCALC, TRIG, CHOLHDL, LDLDIRECT in the last 72 hours. Thyroid function studies No results for input(s): TSH, T4TOTAL, T3FREE, THYROIDAB in the last 72 hours.  Invalid input(s): FREET3 Anemia work up No results for input(s): VITAMINB12, FOLATE, FERRITIN, TIBC, IRON, RETICCTPCT in the last 72 hours. Urinalysis    Component Value Date/Time   COLORURINE YELLOW 06/19/2016 0854   COLORURINE YELLOW 06/19/2016 0854   APPEARANCEUR CLEAR 06/19/2016 [place] 06/19/2016 0854   LABSPEC 1.042 (H) 06/19/2016 0854   LABSPEC 1.042 (H) 06/19/2016 0854   PHURINE 6.5 06/19/2016 0854   PHURINE 6.5 06/19/2016 0854   GLUCOSEU NEGATIVE 06/19/2016 0854   GLUCOSEU NEGATIVE 06/19/2016 0854   HGBUR NEGATIVE 06/19/2016 0854   HGBUR NEGATIVE 06/19/2016 0854   BILIRUBINUR NEGATIVE 06/19/2016 0854   BILIRUBINUR NEGATIVE 06/19/2016 0854   BILIRUBINUR neg 07/08/2015 1136   KETONESUR NEGATIVE 06/19/2016 0854   KETONESUR NEGATIVE 06/19/2016 0854   PROTEINUR NEGATIVE 06/19/2016 0854   PROTEINUR NEGATIVE 06/19/2016 0854   UROBILINOGEN 0.2 07/08/2015 1136   NITRITE NEGATIVE 06/19/2016 0854   NITRITE NEGATIVE 06/19/2016 0854   LEUKOCYTESUR MODERATE (A) 06/19/2016 0854   LEUKOCYTESUR MODERATE (A) 06/19/2016 0854   Sepsis Labs Invalid input(s): PROCALCITONIN,  WBC,  LACTICIDVEN Microbiology No results found for this or any previous visit (from the past 240 hour(s)).   Time coordinating discharge: Over 30 minutes  SIGNED:   Louellen Molder, MD  [facility] 07/07/2016, 2:24 PM Pager   If 7PM-7AM, please contact night-coverage [email or web address] Password [email or web address]

## 2016-07-08 ENCOUNTER — Telehealth: Payer: Self-pay | Admitting: Emergency Medicine

## 2016-07-08 DIAGNOSIS — R2689 Other abnormalities of gait and mobility: Secondary | ICD-10-CM | POA: Diagnosis present

## 2016-07-08 DIAGNOSIS — K50012 Crohn's disease of small intestine with intestinal obstruction: Secondary | ICD-10-CM | POA: Diagnosis present

## 2016-07-08 DIAGNOSIS — R339 Retention of urine, unspecified: Secondary | ICD-10-CM | POA: Diagnosis not present

## 2016-07-08 DIAGNOSIS — F29 Unspecified psychosis not due to a substance or known physiological condition: Secondary | ICD-10-CM | POA: Diagnosis not present

## 2016-07-08 DIAGNOSIS — M6281 Muscle weakness (generalized): Secondary | ICD-10-CM | POA: Diagnosis present

## 2016-07-08 DIAGNOSIS — R5381 Other malaise: Secondary | ICD-10-CM | POA: Diagnosis not present

## 2016-07-08 DIAGNOSIS — I10 Essential (primary) hypertension: Secondary | ICD-10-CM | POA: Diagnosis present

## 2016-07-08 DIAGNOSIS — Z720 Tobacco use: Secondary | ICD-10-CM | POA: Diagnosis not present

## 2016-07-08 DIAGNOSIS — E43 Unspecified severe protein-calorie malnutrition: Secondary | ICD-10-CM | POA: Diagnosis not present

## 2016-07-08 DIAGNOSIS — D62 Acute posthemorrhagic anemia: Secondary | ICD-10-CM | POA: Diagnosis not present

## 2016-07-08 DIAGNOSIS — K5900 Constipation, unspecified: Secondary | ICD-10-CM | POA: Diagnosis not present

## 2016-07-08 DIAGNOSIS — R278 Other lack of coordination: Secondary | ICD-10-CM | POA: Diagnosis present

## 2016-07-08 DIAGNOSIS — E876 Hypokalemia: Secondary | ICD-10-CM | POA: Diagnosis not present

## 2016-07-08 DIAGNOSIS — Z5189 Encounter for other specified aftercare: Secondary | ICD-10-CM | POA: Diagnosis present

## 2016-07-08 DIAGNOSIS — K5669 Other intestinal obstruction: Secondary | ICD-10-CM | POA: Diagnosis not present

## 2016-07-08 DIAGNOSIS — E46 Unspecified protein-calorie malnutrition: Secondary | ICD-10-CM | POA: Diagnosis not present

## 2016-07-08 DIAGNOSIS — N3080 Other cystitis without hematuria: Secondary | ICD-10-CM | POA: Diagnosis not present

## 2016-07-08 DIAGNOSIS — K219 Gastro-esophageal reflux disease without esophagitis: Secondary | ICD-10-CM | POA: Diagnosis not present

## 2016-07-08 NOTE — Telephone Encounter (Signed)
Pt called and stated she was released from the hospital yesterday. She has made a Hosp FU appt but the polyethylene glycol (MIRALAX / GLYCOLAX) packet and psyllium (HYDROCIL/METAMUCIL) 95 % PACK they prescribed her is too much. She wants to know what you recommend. Please advise thanks.

## 2016-07-08 NOTE — Telephone Encounter (Signed)
Please advise 

## 2016-07-09 ENCOUNTER — Non-Acute Institutional Stay (SKILLED_NURSING_FACILITY): Payer: Medicare Other | Admitting: Internal Medicine

## 2016-07-09 ENCOUNTER — Encounter: Payer: Self-pay | Admitting: Internal Medicine

## 2016-07-09 DIAGNOSIS — N3080 Other cystitis without hematuria: Secondary | ICD-10-CM

## 2016-07-09 DIAGNOSIS — E876 Hypokalemia: Secondary | ICD-10-CM

## 2016-07-09 DIAGNOSIS — K219 Gastro-esophageal reflux disease without esophagitis: Secondary | ICD-10-CM

## 2016-07-09 DIAGNOSIS — K5669 Other intestinal obstruction: Secondary | ICD-10-CM

## 2016-07-09 DIAGNOSIS — R5381 Other malaise: Secondary | ICD-10-CM | POA: Diagnosis not present

## 2016-07-09 DIAGNOSIS — K5900 Constipation, unspecified: Secondary | ICD-10-CM

## 2016-07-09 DIAGNOSIS — K56609 Unspecified intestinal obstruction, unspecified as to partial versus complete obstruction: Secondary | ICD-10-CM

## 2016-07-09 DIAGNOSIS — F29 Unspecified psychosis not due to a substance or known physiological condition: Secondary | ICD-10-CM | POA: Diagnosis not present

## 2016-07-09 DIAGNOSIS — R339 Retention of urine, unspecified: Secondary | ICD-10-CM | POA: Diagnosis not present

## 2016-07-09 DIAGNOSIS — D62 Acute posthemorrhagic anemia: Secondary | ICD-10-CM | POA: Diagnosis not present

## 2016-07-09 DIAGNOSIS — E46 Unspecified protein-calorie malnutrition: Secondary | ICD-10-CM | POA: Diagnosis not present

## 2016-07-09 NOTE — Telephone Encounter (Signed)
Those are about as inexpensive as medications are. She can also try Ducolox as well.

## 2016-07-09 NOTE — Telephone Encounter (Signed)
There was a misunderstanding one of the doctors that pt sees told her to take both of those stool softners and pt feels like it was too much. I advised that pt call doctor who prescribed them to see why it was prescribed that way. Pt agreed.

## 2016-07-09 NOTE — Progress Notes (Signed)
LOCATION: [place]  PCP: [name], FNP   Code Status: Full Code  Goals of care: Advanced Directive information Advanced Directives 06/19/2016  Does patient have an advance directive? Yes  Does patient want to make changes to advanced directive? No - Patient declined       Extended Emergency Contact Information Primary Emergency Contact: Rushie Nyhan,  Home Phone: [phone] Relation: None   No Known Allergies  Chief Complaint  Patient presents with  . New Admit To SNF    New Admission     HPI:  Patient is a 75 y.o. female seen today for short term rehabilitation post hospital admission from 06/19/16-07/08/16 with small bowel obstruction and emphysematous cystitis. She failed conservative management and underwent lysis of adhesion on 06/25/16 and then had post op ileus. She was treated for UTI and urinary retention with antibiotics, flomax and foley catheter. She has completed her antibiotics. Her foley catheter has been removed and she is voiding well. She is seen in her room today.   Review of Systems:  Constitutional: Negative for fever, chills, diaphoresis. Feels weak and tired. HENT: Negative for headache, congestion,sore throat. Positive for difficulty swallowing.  She has occasional dry cough.  Eyes: Negative for double vision and discharge.  Respiratory: Negative for shortness of breath and wheezing.   Cardiovascular: Negative for chest pain, palpitations. Positive for leg swelling.  Gastrointestinal: Negative for heartburn, nausea, vomiting. Positive for feeling gasey and has some abdominal discomfort. Last bowel movement was 2 days ago. Negative for rectal bleed. Genitourinary: Negative for dysuria and flank pain.  Musculoskeletal: Negative for back pain, fall.  Skin: Negative for itching, rash.  Neurological: Negative for dizziness. Psychiatric/Behavioral: Negative for depression    Past Medical History:  Diagnosis Date  .  Basal cell carcinoma of ala nasi   . Cataract   . Knee pain, bilateral   . Measles   . Myalgia   . Seborrheic dermatitis of scalp   . Varicella    Past Surgical History:  Procedure Laterality Date  . BASAL CELL CARCINOMA EXCISION     '11 [facility])  . CATARACT EXTRACTION W/ INTRAOCULAR LENS IMPLANT     right eye. '09 (Dr. Dolores Lory)  . LAPAROTOMY N/A 06/25/2016   Procedure: EXPLORATORY LAPAROTOMY, lysis of adhesion;  Surgeon: Coralie Keens, MD;  Location: [facility];  Service: General;  Laterality: N/A;  . LYSIS OF ADHESION  06/25/2016   Dr Nedra Hai   Social History:   reports that she has been smoking Cigarettes.  She has a 31.20 pack-year smoking history. She has never used smokeless tobacco. She reports that she drinks alcohol. She reports that she does not use drugs.  Family History  Problem Relation Age of Onset  . Alcohol abuse Mother   . Cancer Father 59    stomach cancer  . Prostate cancer Father   . Hypertension Maternal Grandfather   . Diabetes Neg Hx   . Heart disease Neg Hx     Medications:   Medication List       Accurate as of 07/09/16 10:24 AM. Always use your most recent med list.          famotidine 20 MG tablet Commonly known as:  PEPCID Take 1 tablet (20 mg total) by mouth 2 (two) times daily.   HYDROcodone-acetaminophen 5-325 MG tablet Commonly known as:  NORCO/VICODIN Take 1 tablet by mouth every 6 (six) hours as needed for moderate  pain.   nicotine 14 mg/24hr patch Commonly known as:  NICODERM CQ - dosed in mg/24 hours Place 1 patch (14 mg total) onto the skin daily.   polyethylene glycol packet Commonly known as:  MIRALAX / GLYCOLAX Take 17 g by mouth daily as needed for mild constipation.   psyllium 95 % Pack Commonly known as:  HYDROCIL/METAMUCIL Take 1 packet by mouth at bedtime.   tamsulosin 0.4 MG Caps capsule Commonly known as:  FLOMAX Take 1 capsule (0.4 mg total) by mouth daily.   trifluoperazine 5 MG tablet Commonly known  as:  STELAZINE Take 1 tablet by mouth every evening.   UNABLE TO FIND Med Name: Med pass 120 mL 2 times daily between meals       Immunizations: Immunization History  Administered Date(s) Administered  . Influenza Whole 08/01/2010, 08/15/2012  . Influenza, High Dose Seasonal PF 07/24/2013, 07/15/2014  . Influenza-Unspecified 07/24/2013  . PPD Test 07/08/2016  . Pneumococcal Conjugate-13 08/12/2014  . Pneumococcal Polysaccharide-23 10/01/2008  . Tdap 01/01/2004  . Tetanus 08/12/2014  . Zoster 10/05/2008     Physical Exam:  Vitals:   07/09/16 1018  BP: 132/78  Pulse: 81  Resp: 18  Temp: 97.5 F (36.4 C)  TempSrc: Oral  SpO2: 98%  Weight: 151 lb 3.2 oz (68.6 kg)  Height: 5\' 5"  (1.651 m)   Body mass index is 25.16 kg/m.  General- elderly female, well built, in no acute distress Head- normocephalic, atraumatic Nose- no maxillary or frontal sinus tenderness, no nasal discharge Throat- moist mucus membrane Eyes- PERRLA, EOMI, no pallor, no icterus, no discharge, normal conjunctiva, normal sclera Neck- no cervical lymphadenopathy Cardiovascular- normal s1,s2, no murmur, 1+ leg edema Respiratory- bilateral clear to auscultation, no wheeze, no rhonchi, no crackles, no use of accessory muscles Abdomen- bowel sounds present, soft, distended, non tender Musculoskeletal- able to move all 4 extremities, generalized weakness Neurological- =alert and oriented to person, place and time Skin- warm and dry, mid abdominal wall incision healing well with steri strip, no signs of infection Psychiatry- normal mood and affect    Labs reviewed: Basic Metabolic Panel:  Recent Labs  07/05/16 0433 07/06/16 0439 07/07/16 0455  NA 140 138 140  K 4.0 4.0 3.4*  CL 106 105 104  CO2 30 28 30   GLUCOSE 122* 113* 111*  BUN <5* <5* <5*  CREATININE 0.44 0.46 0.51  CALCIUM 7.7* 8.0* 8.1*  MG 1.9 1.9 1.7   Liver Function Tests:  Recent Labs  07/05/16 0433 07/06/16 0439  07/07/16 0455  AST 25 26 26   ALT 23 24 25   ALKPHOS 51 54 60  BILITOT 0.5 0.3 0.6  PROT 4.4* 4.8* 4.7*  ALBUMIN 2.2* 2.2* 2.1*    Recent Labs  06/19/16 0739  LIPASE 24   No results for input(s): AMMONIA in the last 8760 hours. CBC:  Recent Labs  07/05/16 0433 07/06/16 0439 07/07/16 0455  WBC 10.4 8.3 7.0  NEUTROABS 8.0* 5.7 4.5  HGB 11.3* 11.4* 10.6*  HCT 35.3* 35.1* 33.1*  MCV 85.5 85.0 84.7  PLT 308 309 309   Cardiac Enzymes: No results for input(s): CKTOTAL, CKMB, CKMBINDEX, TROPONINI in the last 8760 hours. BNP: Invalid input(s): POCBNP CBG: No results for input(s): GLUCAP in the last 8760 hours.  Radiological Exams: Dg Abd 1 View  Result Date: 06/24/2016 CLINICAL DATA:  Small bowel obstruction EXAM: ABDOMEN - 1 VIEW COMPARISON:  Supine abdominal radiograph June 22, 2016 FINDINGS: There remain loops of moderately distended gas-filled small bowel  in the upper and mid abdomen. There is no significant colonic gas. There is some stool in the right colon. There is no rectal gas. There are no free extraluminal gas collections. The esophagogastric tube tip in proximal port project in the gastric body. IMPRESSION: Persistent mid to distal small bowel obstruction. No evidence of perforation. Electronically Signed   By: David  Martinique M.D.   On: 06/24/2016 09:09   Dg Abd 1 View  Result Date: 06/22/2016 CLINICAL DATA:  Nasogastric tube placement. EXAM: ABDOMEN - 1 VIEW COMPARISON:  Radiograph of June 22, 2016. FINDINGS: Nasogastric tube tip is seen in proximal stomach. There is continued and stable presence of dilated small bowel loops concerning for distal small bowel obstruction. No abnormal calcifications are noted. IMPRESSION: Stable small bowel dilatation concerning for distal small bowel obstruction. Nasogastric tube tip seen in proximal stomach. Electronically Signed   By: Marijo Conception, M.D.   On: 06/22/2016 20:40   Dg Abd 1 View  Result Date: 06/21/2016 CLINICAL  DATA:  General abdominal pain and distension EXAM: ABDOMEN - 1 VIEW COMPARISON:  06/19/2016 FINDINGS: Scattered large and small bowel gas is noted. Multiple dilated loops of small bowel are noted consistent with a small-bowel obstruction. This has increased in the interval from the prior exam. IMPRESSION: Increased small bowel dilatation consistent with at least partial small bowel obstruction. Electronically Signed   By: Inez Catalina M.D.   On: 06/21/2016 14:31   Ct Abdomen Pelvis W Contrast  Result Date: 06/19/2016 CLINICAL DATA:  Abdominal pain with nausea and vomiting for 1 day EXAM: CT ABDOMEN AND PELVIS WITH CONTRAST TECHNIQUE: Multidetector CT imaging of the abdomen and pelvis was performed using the standard protocol following bolus administration of intravenous contrast. Oral contrast was also administered. CONTRAST:  1102mL ISOVUE-300 IOPAMIDOL (ISOVUE-300) INJECTION 61% COMPARISON:  Abdomen series June 19, 2016 FINDINGS: Lower chest: There is focal atelectatic change in the posterior right lung base region. Lung bases otherwise are clear. A small amount of gastroesophageal reflux is noted into the distal esophagus. Hepatobiliary: There is hepatic steatosis. No focal liver lesions are evident. There is a gallstone within the gallbladder. The gallbladder wall is not thickened. There is no biliary duct dilatation. Pancreas: There is no pancreatic mass or inflammatory focus. Spleen: No splenic lesions are evident. Adrenals/Urinary Tract: Adrenals appear normal bilaterally. There is no appreciable renal mass or hydronephrosis on either side. There is no renal or ureteral calculus on either side. Urinary bladder wall is normal in thickness. Urinary bladder is midline. A small focus of air is noted within the urinary bladder anteriorly. A mild degree of urinary bladder prolapse is evident. Stomach/Bowel: There are several loops of proximal to mid jejunum which show mild wall thickening. There is no focal  bowel obstruction evident on this study. No free air or portal venous air. Mesentery does not appear thickened. Vascular/Lymphatic: There is atherosclerotic calcification in the aorta and iliac arteries. There is no demonstrable abdominal aortic aneurysm. The major mesenteric vessels appear patent. There is no adenopathy in the abdomen or pelvis. Reproductive: Uterus appears absent. There is no pelvic mass or pelvic fluid collection. Other: There is no appendiceal region lesion. No abscess is seen in the abdomen or pelvis. A small amount of ascites is noted in the dependent portion of the pelvis. No ascites noted elsewhere. Musculoskeletal: There is degenerative change in the lumbar spine and hip joint regions. There are no blastic or lytic bone lesions. There is no intramuscular or abdominal  wall lesion. IMPRESSION: Small focus of air is noted in the urinary bladder. Unless the patient has had recent instrumentation in this area, infectious etiology is felt to be most likely for air within the urinary bladder. Urinary bladder wall is not thickened. No renal or ureteral calculi. No hydronephrosis. There is a small gallstone in the gallbladder. The gallbladder wall does not appear appreciably thickened. There is hepatic steatosis. There is mild wall thickening involving several loops of proximal to mid jejunum without bowel obstruction. Suspect a degree of jejunal enteritis. The surrounding mesenteric does not appear appreciably thickened. Mild ascites in the dependent portion of the pelvis. This finding may be due to the bowel inflammation noted in the left abdomen. Evidence of a degree of gastroesophageal reflux. Extensive aortic atherosclerosis. Evidence of a degree of urinary bladder prolapse. Electronically Signed   By: Lowella Grip III M.D.   On: 06/19/2016 09:22   Dg Abd Acute W/chest  Result Date: 07/01/2016 CLINICAL DATA:  Small obstruction, abdominal pain and distention, shortness of breath,  weakness EXAM: DG ABDOMEN ACUTE W/ 1V CHEST COMPARISON:  Abdominal radiographs 06/25/2016, chest radiograph 06/19/2016 FINDINGS: Nasogastric tube coiled in stomach with tip at distal antrum. Normal heart size, mediastinal contours, and pulmonary vascularity. 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 Bibasilar small pleural effusions and atelectasis. Remaining lungs clear. No pneumothorax. Persistent dilatation small bowel loops in the mid abdomen. Largest of these loops measures 5.4 cm diameter, little changed. Upper normal wall thickness of loops in LEFT mid abdomen. Retained contrast in colon. No free intraperitoneal air. Bones demineralized. Surgical clips from laparotomy and lysis adhesions on 06/25/2016. IMPRESSION: COPD changes with bibasilar pleural effusions and atelectasis. Persistent dilatation of small bowel loops in the mid abdomen, with some of the loops in the LEFT mid abdomen demonstrating mild bowel wall thickening. Interval laparotomy. Findings may represent postoperative ileus or persistent obstruction. Electronically Signed   By: Lavonia Dana M.D.   On: 07/01/2016 10:57   Dg Abdomen Acute W/chest  Result Date: 06/19/2016 CLINICAL DATA:  Abdominal pain with nausea and vomiting EXAM: DG ABDOMEN ACUTE W/ 1V CHEST COMPARISON:  None. FINDINGS: PA chest: No edema or consolidation. The heart size and pulmonary vascularity are normal. No adenopathy. Supine and upright abdomen: There is no appreciable bowel dilatation. There are occasional air-fluid levels on the left. No free air. There are phleboliths in the pelvis. IMPRESSION: Scattered air-fluid levels without bowel dilatation. This finding may be indicative of early ileus or enteritis. Obstruction is not felt to be likely. No free air. Lungs clear. Electronically Signed   By: Lowella Grip III M.D.   On: 06/19/2016 08:22   Dg Abd Portable 1v  Result Date: 06/25/2016 CLINICAL DATA:  Small-bowel obstruction. EXAM: PORTABLE  ABDOMEN - 1 VIEW COMPARISON:  06/24/2016. FINDINGS: NG tube noted in stable position within the stomach. Persistent dilated loops of small bowel with a paucity of intra colonic gas again noted. Findings consistent with persistent small-bowel obstruction. No interim change. No free air. Bibasilar subsegmental atelectasis. IMPRESSION: 1. NG tube in stable position. 2. Persistent unchanged small bowel obstruction. Electronically Signed   By: Marcello Moores  Register   On: 06/25/2016 06:58   Dg Abd Portable 1v  Result Date: 06/22/2016 CLINICAL DATA:  Encounter for nasogastric tube. EXAM: PORTABLE ABDOMEN - 1 VIEW COMPARISON:  Earlier today FINDINGS: Nasogastric tube tip and side port are in the fundus of the stomach. No interval progression of oral contrast which is still in the stomach and  proximal small bowel. Unchanged gaseous distention of bowel. No gross pneumoperitoneum in the supine position. Elevated left diaphragm. IMPRESSION: 1. Stable positioning of nasogastric tube with tip in the gastric fundus. 2. Small bowel obstruction with no progression of oral contrast since earlier today. Small bowel obstruction has progressed since admission CT 06/19/2016. Electronically Signed   By: Monte Fantasia M.D.   On: 06/22/2016 22:14   Dg Abd Portable 1v-small Bowel Obstruction Protocol-initial, 8 Hr Delay  Result Date: 06/22/2016 CLINICAL DATA:  Small bowel obstruction.  8 hour follow-up. EXAM: PORTABLE ABDOMEN - 1 VIEW COMPARISON:  06/21/2016 FINDINGS: There is an ongoing small bowel obstruction with similar degree of small bowel dilation, maximum diameter of 4.6 cm. No free intra-abdominal gas or intestinal pneumatosis on this limited radiograph. IMPRESSION: Ongoing small bowel obstruction. Electronically Signed   By: Fidela Salisbury M.D.   On: 06/22/2016 18:38    Assessment/Plan  Physical deconditioning Will have her work with physical therapy and occupational therapy team to help with gait training and  muscle strengthening exercises.fall precautions. Skin care. Encourage to be out of bed.   Small bowel obstruction S/p ex lap with adhesion lysis. Has follow up with general surgery. Monitor her bowel movement. Soft diet for now and advance as tolerated. Continue norco 5-325 mg q6h prn pain.   Constipation Change her miralax to 17 g daily for now. Continue metamucil. Add senna s 2 tab qhs and monitor  Emphysematous cystitis Asymptomatic at present. Has completed her antibiotic course. Monitor clinically. Hydration to be maintained  Blood loss anemia Post op, monitor cbc  Hypokalemia Monitor bmp  Protein calorie malnutrition RD to evaluate. Monitor po intake. Continue medpass  Psychosis Continue her stelazine, per patient taking 2.5 mg daily at home, change to home dosage and monitor.   gerd Continue famotidine 20 mg daily  Urinary retention Has good urine output at present. Continue flomax and monitor output.     Goals of care: short term rehabilitation   Labs/tests ordered: cbc, cmp 07/12/16  Family/ staff Communication: reviewed care plan with patient and nursing supervisor    Blanchie Serve, MD Internal Medicine [place], [place] [address] Cell Phone (Monday-Friday 8 am - 5 pm): [phone] On Call: [phone] and follow prompts after 5 pm and on weekends Office Phone: [phone] Office Fax: [phone]

## 2016-07-12 LAB — CBC AND DIFFERENTIAL
HEMATOCRIT: 33 % — AB (ref 36–46)
Hemoglobin: 10.1 g/dL — AB (ref 12.0–16.0)
NEUTROS ABS: 4 /uL
Platelets: 305 10*3/uL (ref 150–399)
WBC: 7 10*3/mL

## 2016-07-12 LAB — HEPATIC FUNCTION PANEL
ALT: 17 U/L (ref 7–35)
AST: 13 U/L (ref 13–35)
Alkaline Phosphatase: 62 U/L (ref 25–125)
BILIRUBIN, TOTAL: 0.3 mg/dL

## 2016-07-12 LAB — BASIC METABOLIC PANEL
BUN: 5 mg/dL (ref 4–21)
Creatinine: 0.4 mg/dL — AB (ref 0.5–1.1)
GLUCOSE: 104 mg/dL
Potassium: 4.4 mmol/L (ref 3.4–5.3)
SODIUM: 142 mmol/L (ref 137–147)

## 2016-07-13 ENCOUNTER — Inpatient Hospital Stay: Payer: Medicare Other | Admitting: Family

## 2016-07-23 ENCOUNTER — Encounter: Payer: Self-pay | Admitting: Adult Health

## 2016-07-23 ENCOUNTER — Non-Acute Institutional Stay (SKILLED_NURSING_FACILITY): Payer: Medicare Other | Admitting: Adult Health

## 2016-07-23 DIAGNOSIS — E43 Unspecified severe protein-calorie malnutrition: Secondary | ICD-10-CM | POA: Diagnosis not present

## 2016-07-23 DIAGNOSIS — K219 Gastro-esophageal reflux disease without esophagitis: Secondary | ICD-10-CM | POA: Diagnosis not present

## 2016-07-23 DIAGNOSIS — R5381 Other malaise: Secondary | ICD-10-CM | POA: Diagnosis not present

## 2016-07-23 DIAGNOSIS — F29 Unspecified psychosis not due to a substance or known physiological condition: Secondary | ICD-10-CM

## 2016-07-23 DIAGNOSIS — Z720 Tobacco use: Secondary | ICD-10-CM | POA: Diagnosis not present

## 2016-07-23 DIAGNOSIS — R339 Retention of urine, unspecified: Secondary | ICD-10-CM | POA: Diagnosis not present

## 2016-07-23 DIAGNOSIS — N3080 Other cystitis without hematuria: Secondary | ICD-10-CM

## 2016-07-23 DIAGNOSIS — K5900 Constipation, unspecified: Secondary | ICD-10-CM

## 2016-07-23 DIAGNOSIS — K5669 Other intestinal obstruction: Secondary | ICD-10-CM

## 2016-07-23 DIAGNOSIS — D62 Acute posthemorrhagic anemia: Secondary | ICD-10-CM

## 2016-07-23 DIAGNOSIS — K56609 Unspecified intestinal obstruction, unspecified as to partial versus complete obstruction: Secondary | ICD-10-CM

## 2016-07-23 NOTE — Progress Notes (Signed)
Patient ID: Brenda Gibson, female   DOB: [birth year], 75 y.o.   MRN: [id number]    DATE:  07/23/2016    MRN:  [id number]  BIRTHDAY: [birth year]  Facility:  Nursing Home Location:  [place] Room Number: [id number]  LEVEL OF CARE:  SNF [phone])  Contact Information    Name [place]   [phone]       Code Status History    Date Active Date Inactive Code Status Order ID Comments User Context   06/19/2016 12:54 PM 07/07/2016  6:18 PM Full Code [id number]  Charlynne Cousins, MD Inpatient       Chief Complaint  Patient presents with  . Discharge Note    HISTORY OF PRESENT ILLNESS:  This is a 75 year old female who is for discharge home with Home health PT, OT and Nursing.   She has been admitted to [facility] on 07/08/16 from [facility] with small bowel obstruction and emphysematous cystitis. She failed conservative management and underwent lysis of adhesion on 06/25/16 and then had post op ileus. She was treated for UTI and urinary retention with antibiotics, flomax and foley catheter  Patient was admitted to this facility for short-term rehabilitation after the patient's recent hospitalization.  Patient has completed SNF rehabilitation and therapy has cleared the patient for discharge.   PAST MEDICAL HISTORY:  Past Medical History:  Diagnosis Date  . Basal cell carcinoma of ala nasi   . Cataract   . Knee pain, bilateral   . Measles   . Myalgia   . Seborrheic dermatitis of scalp   . Varicella      CURRENT MEDICATIONS: Reviewed  Patient's Medications  New Prescriptions   No medications on file  Previous Medications   FAMOTIDINE (PEPCID) 20 MG TABLET    Take 1 tablet (20 mg total) by mouth 2 (two) times daily.   HYDROCODONE-ACETAMINOPHEN (NORCO/VICODIN) 5-325 MG TABLET    Take 1 tablet by mouth every 6 (six) hours as needed for moderate pain.   NICOTINE  (NICODERM CQ - DOSED IN MG/24 HOURS) 14 MG/24HR PATCH    Place 1 patch (14 mg total) onto the skin daily.   POLYETHYLENE GLYCOL (MIRALAX / GLYCOLAX) PACKET    Take 17 g by mouth daily as needed for mild constipation.   PSYLLIUM (HYDROCIL/METAMUCIL) 95 % PACK    Take 1 packet by mouth at bedtime.   SENNOSIDES-DOCUSATE SODIUM (SENOKOT-S) 8.6-50 MG TABLET    Take 2 tablets by mouth at bedtime.   TAMSULOSIN (FLOMAX) 0.4 MG CAPS CAPSULE    Take 1 capsule (0.4 mg total) by mouth daily.   TRIFLUOPERAZINE (STELAZINE) 5 MG TABLET    Take 0.5 tablets by mouth every evening. Take 1/2 tablet to = 2.5 mg qd   UNABLE TO FIND    Med Name: Med pass 120 mL 2 times daily between meals  Modified Medications   No medications on file  Discontinued Medications   No medications on file     No Known Allergies   REVIEW OF SYSTEMS:  GENERAL: no change in appetite, no fatigue, no weight changes, no fever, chills or weakness EYES: Denies change in vision, dry eyes, eye pain, itching or discharge EARS: Denies change in hearing, ringing in ears, or earache NOSE: Denies nasal congestion or epistaxis MOUTH and THROAT: Denies oral discomfort, gingival pain or bleeding, pain from teeth or hoarseness  RESPIRATORY: no cough, SOB, DOE, wheezing, hemoptysis CARDIAC: no chest pain, edema or palpitations GI: no abdominal pain, diarrhea, constipation, heart burn, nausea or vomiting GU: Denies dysuria, frequency, hematuria, incontinence, or discharge PSYCHIATRIC: Denies feeling of depression or anxiety. No report of hallucinations, insomnia, paranoia, or agitation     PHYSICAL EXAMINATION  GENERAL APPEARANCE: Well nourished. In no acute distress. Normal body habitus SKIN:  Abdominal midline incision is healed  HEAD: Normal in size and contour. No evidence of trauma EYES: Lids open and close normally. No blepharitis, entropion or ectropion. PERRL. Conjunctivae are clear and sclerae are white. Lenses are without  opacity EARS: Pinnae are normal. Patient hears normal voice tunes of the examiner MOUTH and THROAT: Lips are without lesions. Oral mucosa is moist and without lesions. Tongue is normal in shape, size, and color and without lesions NECK: supple, trachea midline, no neck masses, no thyroid tenderness, no thyromegaly LYMPHATICS: no LAN in the neck, no supraclavicular LAN RESPIRATORY: breathing is even & unlabored, BS CTAB CARDIAC: RRR, no murmur,no extra heart sounds, no edema GI: abdomen soft, normal BS, no masses, no tenderness, no hepatomegaly, no splenomegaly EXTREMITIES:  Able to move X 4 extremities PSYCHIATRIC: Alert and oriented X 3. Affect and behavior are appropriate  LABS/RADIOLOGY: Labs reviewed: Basic Metabolic Panel:  Recent Labs  07/05/16 0433 07/06/16 0439 07/07/16 0455 07/12/16  NA 140 138 140 142  K 4.0 4.0 3.4* 4.4  CL 106 105 104  --   CO2 30 28 30   --   GLUCOSE 122* 113* 111*  --   BUN <5* <5* <5* 5  CREATININE 0.44 0.46 0.51 0.4*  CALCIUM 7.7* 8.0* 8.1*  --   MG 1.9 1.9 1.7  --    Liver Function Tests:  Recent Labs  07/05/16 0433 07/06/16 0439 07/07/16 0455 07/12/16  AST 25 26 26 13   ALT 23 24 25 17   ALKPHOS 51 54 60 62  BILITOT 0.5 0.3 0.6  --   PROT 4.4* 4.8* 4.7*  --   ALBUMIN 2.2* 2.2* 2.1*  --     Recent Labs  06/19/16 0739  LIPASE 24   CBC:  Recent Labs  07/05/16 0433 07/06/16 0439 07/07/16 0455 07/12/16  WBC 10.4 8.3 7.0 7.0  NEUTROABS 8.0* 5.7 4.5 4  HGB 11.3* 11.4* 10.6* 10.1*  HCT 35.3* 35.1* 33.1* 33*  MCV 85.5 85.0 84.7  --   PLT 308 309 309 305      Dg Abd 1 View  Result Date: 06/24/2016 CLINICAL DATA:  Small bowel obstruction EXAM: ABDOMEN - 1 VIEW COMPARISON:  Supine abdominal radiograph June 22, 2016 FINDINGS: There remain loops of moderately distended gas-filled small bowel in the upper and mid abdomen. There is no significant colonic gas. There is some stool in the right colon. There is no rectal gas. There  are no free extraluminal gas collections. The esophagogastric tube tip in proximal port project in the gastric body. IMPRESSION: Persistent mid to distal small bowel obstruction. No evidence of perforation. Electronically Signed   By: David  Martinique M.D.   On: 06/24/2016 09:09   Dg Abd Acute W/chest  Result Date: 07/01/2016 CLINICAL DATA:  Small obstruction, abdominal pain and distention, shortness of breath, weakness EXAM: DG ABDOMEN ACUTE W/ 1V CHEST COMPARISON:  Abdominal radiographs 06/25/2016, chest radiograph 06/19/2016 FINDINGS: Nasogastric tube coiled in stomach with tip at distal antrum. Normal heart size, mediastinal contours, and pulmonary vascularity. 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 Bibasilar small pleural effusions and  atelectasis. Remaining lungs clear. No pneumothorax. Persistent dilatation small bowel loops in the mid abdomen. Largest of these loops measures 5.4 cm diameter, little changed. Upper normal wall thickness of loops in LEFT mid abdomen. Retained contrast in colon. No free intraperitoneal air. Bones demineralized. Surgical clips from laparotomy and lysis adhesions on 06/25/2016. IMPRESSION: COPD changes with bibasilar pleural effusions and atelectasis. Persistent dilatation of small bowel loops in the mid abdomen, with some of the loops in the LEFT mid abdomen demonstrating mild bowel wall thickening. Interval laparotomy. Findings may represent postoperative ileus or persistent obstruction. Electronically Signed   By: Lavonia Dana M.D.   On: 07/01/2016 10:57   Dg Abd Portable 1v  Result Date: 06/25/2016 CLINICAL DATA:  Small-bowel obstruction. EXAM: PORTABLE ABDOMEN - 1 VIEW COMPARISON:  06/24/2016. FINDINGS: NG tube noted in stable position within the stomach. Persistent dilated loops of small bowel with a paucity of intra colonic gas again noted. Findings consistent with persistent small-bowel obstruction. No interim change. No free air. Bibasilar  subsegmental atelectasis. IMPRESSION: 1. NG tube in stable position. 2. Persistent unchanged small bowel obstruction. Electronically Signed   By: Marcello Moores  Register   On: 06/25/2016 06:58    ASSESSMENT/PLAN:  Physical deconditioning - will have Homehealth PT, OT and Nursing; fall precaution  Small bowel obstruction S/P Ex Lap with adhesion lysis - follow-up with general surgeon; complains that Norco makes her have nightmares, so Norco will be discontinued and will start Ultram 50 mg 1 tab PO BID PRN for pain  Emphysematous cystitis - asymptomatic; completed antibiotic course  Constipation - continue Miralax 17 gm PO daily, Metamucil 1 packet PO Q HS and Senna-S 2 tabs PO Q HS  Tobacco use disorder - continue Nicotine CQ 14 mg/24 hour 1 patch onto skin daily  Urinary retention - has good urine output; continue Flomax 0.4 mg PO daily  Psychosis - mood is stable; continue Trifluoperazine 5 mg 1/2 tab = 2.5 mg daily  GERD - continue Pepcid 20 mg 1 tab PO BID  Protein-calorie malnutrition, severe - albumin 2.1; continue Med pass 120 ml PO BID  Anemia, acute blood loss - stable Lab Results  Component Value Date   HGB 10.1 (A) 07/12/2016       I have filled out patient's discharge paperwork and written prescriptions.  Patient will receive home health PT, OT and Nursing.  DME provided:  None  Total discharge time: Greater than 30 minutes Greater than 50% was spent in counseling and coordination of care with the patient.    Discharge time involved coordination of the discharge process with social worker, nursing staff and therapy department. Medical justification for home health services verified.    Durenda Age, NP [employer] [phone]

## 2016-07-29 DIAGNOSIS — Z720 Tobacco use: Secondary | ICD-10-CM | POA: Diagnosis not present

## 2016-07-29 DIAGNOSIS — E785 Hyperlipidemia, unspecified: Secondary | ICD-10-CM | POA: Diagnosis not present

## 2016-07-29 DIAGNOSIS — I10 Essential (primary) hypertension: Secondary | ICD-10-CM | POA: Diagnosis not present

## 2016-07-29 DIAGNOSIS — E440 Moderate protein-calorie malnutrition: Secondary | ICD-10-CM | POA: Diagnosis not present

## 2016-07-29 DIAGNOSIS — K219 Gastro-esophageal reflux disease without esophagitis: Secondary | ICD-10-CM | POA: Diagnosis not present

## 2016-07-29 DIAGNOSIS — F419 Anxiety disorder, unspecified: Secondary | ICD-10-CM | POA: Diagnosis not present

## 2016-07-29 DIAGNOSIS — Z48815 Encounter for surgical aftercare following surgery on the digestive system: Secondary | ICD-10-CM | POA: Diagnosis not present

## 2016-07-29 DIAGNOSIS — Z87440 Personal history of urinary (tract) infections: Secondary | ICD-10-CM | POA: Diagnosis not present

## 2016-07-29 DIAGNOSIS — R339 Retention of urine, unspecified: Secondary | ICD-10-CM | POA: Diagnosis not present

## 2016-07-29 NOTE — Progress Notes (Signed)
Subjective:    Patient ID: Brenda Gibson, female    DOB: 02-21-1941, 75 y.o.   MRN: [id number]  Chief Complaint  Patient presents with  . Hospitalization Follow-up    HPI:  Brenda Gibson is a 75 y.o. female who  has a past medical history of Basal cell carcinoma of ala nasi; Cataract; Knee pain, bilateral; Measles; Myalgia; Seborrheic dermatitis of scalp; and Varicella. and presents today for a hospitalization follow up.   This is a new problem. Recently evaluated in the emergency department and admitted to the hospital with lower abdominal pain described as constant and 3 episodes of nonbloody and nonbilious emesis. Physical exam with diffuse tenderness worse in the lower quadrants with no rebound and voluntary guarding throughout. Urinalysis appears with symptoms of infection and acute abdominal series with no bowel traction. CT scan was concerning for emphysematous cystitis. She was started on Rocephin and admitted. She was also noted to have a small bowel obstruction requiring a exploratory laparotomy on 06/25/16 with lysis of adhesions. She did experience a postoperative ileus which resolved upon discharge. Physical therapy recommended home health with primary care follow-up. Did have urinary retention and required Foley catheter insertion twice during hospitalization which was improved with Flomax. She was admitted to skilled nursing facility for short-term rehabilitation. She was noted to have physical deconditioning with continued home health physical therapy, occupational therapy, nursing, and fall precautions. She was started on Ultram as needed for pain as Norco resulted in her having nightmares. She completed the antibiotic course and the cystitis was completely resolved. Upon discharge from skilled nursing facility recommended to continue MiraLAX and Metamucil. She was continued on Flomax for good urinary output. Her mood was noted to be stable. She also noted to have continued protein  calorie malnutrition. All hospital records, imaging, and labs were reviewed in detail.  Indicates that she has been doing fairly well. Expresses some difficulty getting around secondary to generalized weakness. Continues to work with physical and occupational therapists through [employer]. Moving around with a walker. Working on improving functional activities and ADLs. Continues to eat and drink meals and drinking Ensure between meals as a supplement. Has concern about how much fiber she needs to take and her medications to prevent constipation.     No Known Allergies    Outpatient Medications Prior to Visit  Medication Sig Dispense Refill  . famotidine (PEPCID) 20 MG tablet Take 1 tablet (20 mg total) by mouth 2 (two) times daily. 20 tablet 0  . polyethylene glycol (MIRALAX / GLYCOLAX) packet Take 17 g by mouth daily as needed for mild constipation.    . psyllium (HYDROCIL/METAMUCIL) 95 % PACK Take 1 packet by mouth at bedtime. 15 each 0  . sennosides-docusate sodium (SENOKOT-S) 8.6-50 MG tablet Take 2 tablets by mouth at bedtime.    . tamsulosin (FLOMAX) 0.4 MG CAPS capsule Take 1 capsule (0.4 mg total) by mouth daily. 30 capsule 0  . trifluoperazine (STELAZINE) 5 MG tablet Take 0.5 tablets by mouth every evening. Take 1/2 tablet to = 2.5 mg qd  7  . UNABLE TO FIND Med Name: Med pass 120 mL 2 times daily between meals    . HYDROcodone-acetaminophen (NORCO/VICODIN) 5-325 MG tablet Take 1 tablet by mouth every 6 (six) hours as needed for moderate pain. 15 tablet 0  . nicotine (NICODERM CQ - DOSED IN MG/24 HOURS) 14 mg/24hr patch Place 1 patch (14 mg total) onto the skin daily. 28 patch 0  No facility-administered medications prior to visit.       Past Surgical History:  Procedure Laterality Date  . BASAL CELL CARCINOMA EXCISION     '11 Allyson Sabal)  . CATARACT EXTRACTION W/ INTRAOCULAR LENS IMPLANT     right eye. '09 (Dr. Dolores Lory)  . LAPAROTOMY N/A 06/25/2016   Procedure: EXPLORATORY  LAPAROTOMY, lysis of adhesion;  Surgeon: Coralie Keens, MD;  Location: [facility];  Service: General;  Laterality: N/A;  . LYSIS OF ADHESION  06/25/2016   Dr Nedra Hai      Past Medical History:  Diagnosis Date  . Basal cell carcinoma of ala nasi   . Cataract   . Knee pain, bilateral   . Measles   . Myalgia   . Seborrheic dermatitis of scalp   . Varicella       Review of Systems  Constitutional: Negative for chills and fever.  Respiratory: Negative for chest tightness and shortness of breath.   Cardiovascular: Negative for chest pain and palpitations.  Gastrointestinal: Positive for constipation. Negative for abdominal distention, abdominal pain, anal bleeding, blood in stool, diarrhea, nausea and vomiting.  Genitourinary: Negative for dysuria, flank pain, frequency, hematuria, pelvic pain and urgency.      Objective:    BP 118/72 (BP Location: Left Arm, Patient Position: Sitting, Cuff Size: Normal)   Pulse 93   Temp 98.3 F (36.8 C) (Oral)   Resp 16   Ht 5\' 5"  (1.651 m)   Wt 115 lb (52.2 kg)   SpO2 94%   BMI 19.14 kg/m  Nursing note and vital signs reviewed.  Physical Exam  Constitutional: She is oriented to person, place, and time. She appears well-developed and well-nourished. No distress.  Cardiovascular: Normal rate, regular rhythm, normal heart sounds and intact distal pulses.   Pulmonary/Chest: Effort normal and breath sounds normal.  Abdominal: Soft. Bowel sounds are normal. She exhibits no distension and no mass. There is no tenderness. There is no rebound and no guarding.  Neurological: She is alert and oriented to person, place, and time.  Skin: Skin is warm and dry.  Psychiatric: She has a normal mood and affect. Her behavior is normal. Judgment and thought content normal.       Assessment & Plan:   Problem List Items Addressed This Visit      Digestive   Small bowel obstruction ([place]) - Primary    Small bowel obstruction appears resolved with  no complications and no further symptoms of abdominal pain. Does experience mild constipation times. Discussed importance of balancing medications recommend continuing psyllium and using MiraLAX or Senokot as needed for constipation. No further treatment necessary at this time. We'll continue to monitor constipation.        Other   Physical deconditioning    Physical deconditioning secondary to most recent hospitalization with slow gradual improvements. Completed rehabilitation through skilled nursing facility and now progressing to home health physical and occupational therapy. Continues to ambulate with a walker with goals of return to normal function with therapies. Handicap placard signed for temporary assistance. Recommend continue nutritional intake focused on calories and protein. Follow-up in one month or sooner if needed.       Other Visit Diagnoses    Encounter for immunization       Relevant Orders   Flu vaccine HIGH DOSE PF (Completed)       I have discontinued Ms. Nordgren's nicotine and HYDROcodone-acetaminophen. I am also having her maintain her trifluoperazine, psyllium, tamsulosin, famotidine, polyethylene glycol, UNABLE TO  FIND, and sennosides-docusate sodium.   Follow-up: Return in about 1 month (around 08/29/2016), or if symptoms worsen or fail to improve.  [name], FNP

## 2016-07-30 ENCOUNTER — Ambulatory Visit (INDEPENDENT_AMBULATORY_CARE_PROVIDER_SITE_OTHER): Payer: Medicare Other | Admitting: Family

## 2016-07-30 ENCOUNTER — Encounter: Payer: Self-pay | Admitting: Family

## 2016-07-30 VITALS — BP 118/72 | HR 93 | Temp 98.3°F | Resp 16 | Ht 65.0 in | Wt 115.0 lb

## 2016-07-30 DIAGNOSIS — R5381 Other malaise: Secondary | ICD-10-CM

## 2016-07-30 DIAGNOSIS — K5669 Other intestinal obstruction: Secondary | ICD-10-CM | POA: Diagnosis not present

## 2016-07-30 DIAGNOSIS — R339 Retention of urine, unspecified: Secondary | ICD-10-CM | POA: Diagnosis not present

## 2016-07-30 DIAGNOSIS — Z48815 Encounter for surgical aftercare following surgery on the digestive system: Secondary | ICD-10-CM | POA: Diagnosis not present

## 2016-07-30 DIAGNOSIS — I10 Essential (primary) hypertension: Secondary | ICD-10-CM | POA: Diagnosis not present

## 2016-07-30 DIAGNOSIS — E440 Moderate protein-calorie malnutrition: Secondary | ICD-10-CM | POA: Diagnosis not present

## 2016-07-30 DIAGNOSIS — K219 Gastro-esophageal reflux disease without esophagitis: Secondary | ICD-10-CM | POA: Diagnosis not present

## 2016-07-30 DIAGNOSIS — F419 Anxiety disorder, unspecified: Secondary | ICD-10-CM | POA: Diagnosis not present

## 2016-07-30 DIAGNOSIS — Z23 Encounter for immunization: Secondary | ICD-10-CM

## 2016-07-30 DIAGNOSIS — K56609 Unspecified intestinal obstruction, unspecified as to partial versus complete obstruction: Secondary | ICD-10-CM

## 2016-07-30 NOTE — Patient Instructions (Signed)
Thank you for choosing [facility].  SUMMARY AND INSTRUCTIONS:  Please continue to take her medications as prescribed.  Continue to use the bowel regimen including fiber and either MiraLAX or Senokot.  Tylenol as needed for pain.  Continue to work with physical and occupational therapy to improve strength and endurance.   Follow up:  If your symptoms worsen or fail to improve, please contact our office for further instruction, or in case of emergency go directly to the emergency room at the closest medical facility.

## 2016-07-30 NOTE — Assessment & Plan Note (Addendum)
Physical deconditioning secondary to most recent hospitalization with slow gradual improvements. Completed rehabilitation through skilled nursing facility and now progressing to home health physical and occupational therapy. Continues to ambulate with a walker with goals of return to normal function with therapies. Handicap placard signed for temporary assistance. Recommend continue nutritional intake focused on calories and protein. Follow-up in one month or sooner if needed.

## 2016-07-30 NOTE — Assessment & Plan Note (Signed)
Small bowel obstruction appears resolved with no complications and no further symptoms of abdominal pain. Does experience mild constipation times. Discussed importance of balancing medications recommend continuing psyllium and using MiraLAX or Senokot as needed for constipation. No further treatment necessary at this time. We'll continue to monitor constipation.

## 2016-08-02 DIAGNOSIS — F419 Anxiety disorder, unspecified: Secondary | ICD-10-CM | POA: Diagnosis not present

## 2016-08-02 DIAGNOSIS — Z48815 Encounter for surgical aftercare following surgery on the digestive system: Secondary | ICD-10-CM | POA: Diagnosis not present

## 2016-08-02 DIAGNOSIS — K219 Gastro-esophageal reflux disease without esophagitis: Secondary | ICD-10-CM | POA: Diagnosis not present

## 2016-08-02 DIAGNOSIS — R339 Retention of urine, unspecified: Secondary | ICD-10-CM | POA: Diagnosis not present

## 2016-08-02 DIAGNOSIS — E440 Moderate protein-calorie malnutrition: Secondary | ICD-10-CM | POA: Diagnosis not present

## 2016-08-02 DIAGNOSIS — I10 Essential (primary) hypertension: Secondary | ICD-10-CM | POA: Diagnosis not present

## 2016-08-03 DIAGNOSIS — E440 Moderate protein-calorie malnutrition: Secondary | ICD-10-CM | POA: Diagnosis not present

## 2016-08-03 DIAGNOSIS — F419 Anxiety disorder, unspecified: Secondary | ICD-10-CM | POA: Diagnosis not present

## 2016-08-03 DIAGNOSIS — K219 Gastro-esophageal reflux disease without esophagitis: Secondary | ICD-10-CM | POA: Diagnosis not present

## 2016-08-03 DIAGNOSIS — I10 Essential (primary) hypertension: Secondary | ICD-10-CM | POA: Diagnosis not present

## 2016-08-03 DIAGNOSIS — Z48815 Encounter for surgical aftercare following surgery on the digestive system: Secondary | ICD-10-CM | POA: Diagnosis not present

## 2016-08-03 DIAGNOSIS — R339 Retention of urine, unspecified: Secondary | ICD-10-CM | POA: Diagnosis not present

## 2016-08-04 DIAGNOSIS — E440 Moderate protein-calorie malnutrition: Secondary | ICD-10-CM | POA: Diagnosis not present

## 2016-08-04 DIAGNOSIS — Z48815 Encounter for surgical aftercare following surgery on the digestive system: Secondary | ICD-10-CM | POA: Diagnosis not present

## 2016-08-04 DIAGNOSIS — K219 Gastro-esophageal reflux disease without esophagitis: Secondary | ICD-10-CM | POA: Diagnosis not present

## 2016-08-04 DIAGNOSIS — F419 Anxiety disorder, unspecified: Secondary | ICD-10-CM | POA: Diagnosis not present

## 2016-08-04 DIAGNOSIS — I10 Essential (primary) hypertension: Secondary | ICD-10-CM | POA: Diagnosis not present

## 2016-08-04 DIAGNOSIS — R339 Retention of urine, unspecified: Secondary | ICD-10-CM | POA: Diagnosis not present

## 2016-08-05 DIAGNOSIS — K219 Gastro-esophageal reflux disease without esophagitis: Secondary | ICD-10-CM | POA: Diagnosis not present

## 2016-08-05 DIAGNOSIS — F419 Anxiety disorder, unspecified: Secondary | ICD-10-CM | POA: Diagnosis not present

## 2016-08-05 DIAGNOSIS — Z48815 Encounter for surgical aftercare following surgery on the digestive system: Secondary | ICD-10-CM | POA: Diagnosis not present

## 2016-08-05 DIAGNOSIS — E440 Moderate protein-calorie malnutrition: Secondary | ICD-10-CM | POA: Diagnosis not present

## 2016-08-05 DIAGNOSIS — I10 Essential (primary) hypertension: Secondary | ICD-10-CM | POA: Diagnosis not present

## 2016-08-05 DIAGNOSIS — R339 Retention of urine, unspecified: Secondary | ICD-10-CM | POA: Diagnosis not present

## 2016-08-06 DIAGNOSIS — I10 Essential (primary) hypertension: Secondary | ICD-10-CM | POA: Diagnosis not present

## 2016-08-06 DIAGNOSIS — Z48815 Encounter for surgical aftercare following surgery on the digestive system: Secondary | ICD-10-CM | POA: Diagnosis not present

## 2016-08-06 DIAGNOSIS — F419 Anxiety disorder, unspecified: Secondary | ICD-10-CM | POA: Diagnosis not present

## 2016-08-06 DIAGNOSIS — R339 Retention of urine, unspecified: Secondary | ICD-10-CM | POA: Diagnosis not present

## 2016-08-06 DIAGNOSIS — E440 Moderate protein-calorie malnutrition: Secondary | ICD-10-CM | POA: Diagnosis not present

## 2016-08-06 DIAGNOSIS — K219 Gastro-esophageal reflux disease without esophagitis: Secondary | ICD-10-CM | POA: Diagnosis not present

## 2016-08-09 DIAGNOSIS — K219 Gastro-esophageal reflux disease without esophagitis: Secondary | ICD-10-CM | POA: Diagnosis not present

## 2016-08-09 DIAGNOSIS — F419 Anxiety disorder, unspecified: Secondary | ICD-10-CM | POA: Diagnosis not present

## 2016-08-09 DIAGNOSIS — Z48815 Encounter for surgical aftercare following surgery on the digestive system: Secondary | ICD-10-CM | POA: Diagnosis not present

## 2016-08-09 DIAGNOSIS — R339 Retention of urine, unspecified: Secondary | ICD-10-CM | POA: Diagnosis not present

## 2016-08-09 DIAGNOSIS — E440 Moderate protein-calorie malnutrition: Secondary | ICD-10-CM | POA: Diagnosis not present

## 2016-08-09 DIAGNOSIS — I10 Essential (primary) hypertension: Secondary | ICD-10-CM | POA: Diagnosis not present

## 2016-08-10 DIAGNOSIS — I10 Essential (primary) hypertension: Secondary | ICD-10-CM | POA: Diagnosis not present

## 2016-08-10 DIAGNOSIS — F419 Anxiety disorder, unspecified: Secondary | ICD-10-CM | POA: Diagnosis not present

## 2016-08-10 DIAGNOSIS — R339 Retention of urine, unspecified: Secondary | ICD-10-CM | POA: Diagnosis not present

## 2016-08-10 DIAGNOSIS — K219 Gastro-esophageal reflux disease without esophagitis: Secondary | ICD-10-CM | POA: Diagnosis not present

## 2016-08-10 DIAGNOSIS — Z48815 Encounter for surgical aftercare following surgery on the digestive system: Secondary | ICD-10-CM | POA: Diagnosis not present

## 2016-08-10 DIAGNOSIS — E440 Moderate protein-calorie malnutrition: Secondary | ICD-10-CM | POA: Diagnosis not present

## 2016-08-11 DIAGNOSIS — F419 Anxiety disorder, unspecified: Secondary | ICD-10-CM | POA: Diagnosis not present

## 2016-08-11 DIAGNOSIS — K219 Gastro-esophageal reflux disease without esophagitis: Secondary | ICD-10-CM | POA: Diagnosis not present

## 2016-08-11 DIAGNOSIS — E440 Moderate protein-calorie malnutrition: Secondary | ICD-10-CM | POA: Diagnosis not present

## 2016-08-11 DIAGNOSIS — R339 Retention of urine, unspecified: Secondary | ICD-10-CM | POA: Diagnosis not present

## 2016-08-11 DIAGNOSIS — I10 Essential (primary) hypertension: Secondary | ICD-10-CM | POA: Diagnosis not present

## 2016-08-11 DIAGNOSIS — Z48815 Encounter for surgical aftercare following surgery on the digestive system: Secondary | ICD-10-CM | POA: Diagnosis not present

## 2016-08-13 DIAGNOSIS — Z48815 Encounter for surgical aftercare following surgery on the digestive system: Secondary | ICD-10-CM | POA: Diagnosis not present

## 2016-08-13 DIAGNOSIS — E440 Moderate protein-calorie malnutrition: Secondary | ICD-10-CM | POA: Diagnosis not present

## 2016-08-13 DIAGNOSIS — K219 Gastro-esophageal reflux disease without esophagitis: Secondary | ICD-10-CM | POA: Diagnosis not present

## 2016-08-13 DIAGNOSIS — F419 Anxiety disorder, unspecified: Secondary | ICD-10-CM | POA: Diagnosis not present

## 2016-08-13 DIAGNOSIS — I10 Essential (primary) hypertension: Secondary | ICD-10-CM | POA: Diagnosis not present

## 2016-08-13 DIAGNOSIS — R339 Retention of urine, unspecified: Secondary | ICD-10-CM | POA: Diagnosis not present

## 2016-08-16 DIAGNOSIS — R339 Retention of urine, unspecified: Secondary | ICD-10-CM | POA: Diagnosis not present

## 2016-08-16 DIAGNOSIS — K219 Gastro-esophageal reflux disease without esophagitis: Secondary | ICD-10-CM | POA: Diagnosis not present

## 2016-08-16 DIAGNOSIS — E440 Moderate protein-calorie malnutrition: Secondary | ICD-10-CM | POA: Diagnosis not present

## 2016-08-16 DIAGNOSIS — Z48815 Encounter for surgical aftercare following surgery on the digestive system: Secondary | ICD-10-CM | POA: Diagnosis not present

## 2016-08-16 DIAGNOSIS — I10 Essential (primary) hypertension: Secondary | ICD-10-CM | POA: Diagnosis not present

## 2016-08-16 DIAGNOSIS — F419 Anxiety disorder, unspecified: Secondary | ICD-10-CM | POA: Diagnosis not present

## 2016-08-19 DIAGNOSIS — F259 Schizoaffective disorder, unspecified: Secondary | ICD-10-CM | POA: Diagnosis not present

## 2016-08-24 ENCOUNTER — Telehealth: Payer: Self-pay | Admitting: *Deleted

## 2016-08-24 NOTE — Telephone Encounter (Signed)
Patient called to follow up. Advised to allow more time

## 2016-08-24 NOTE — Telephone Encounter (Signed)
Rec'd call pt states she is finish w/the Tamulosin that was given while she was in hosp, wanting to know should she continue taking. She states her flow is still little, doesn't go as much...Johny Chess

## 2016-08-24 NOTE — Telephone Encounter (Signed)
If the Floxmax is helping then yes please continue. Please ensure she is drinking plenty of fluids as well.

## 2016-08-25 MED ORDER — TAMSULOSIN HCL 0.4 MG PO CAPS: 0.4000 mg | ORAL_CAPSULE | Freq: Every day | ORAL | 0 refills | Status: DC

## 2016-08-25 NOTE — Telephone Encounter (Signed)
Spoke with pt and flomax is helping. Would like more sent to [facility].

## 2016-08-25 NOTE — Telephone Encounter (Signed)
Called pt no answer left detail msg w/[name] response below. Rx sent to [facility]...Brenda Gibson

## 2016-08-25 NOTE — Telephone Encounter (Signed)
Rx has been sent  

## 2016-08-25 NOTE — Addendum Note (Signed)
Addended by: Earnstine Regal on: 08/25/2016 09:20 AM   Modules accepted: Orders

## 2016-08-30 ENCOUNTER — Ambulatory Visit (INDEPENDENT_AMBULATORY_CARE_PROVIDER_SITE_OTHER): Payer: Medicare Other | Admitting: Family

## 2016-08-30 ENCOUNTER — Encounter: Payer: Self-pay | Admitting: Family

## 2016-08-30 VITALS — BP 110/82 | HR 77 | Temp 97.6°F | Wt 113.0 lb

## 2016-08-30 DIAGNOSIS — E440 Moderate protein-calorie malnutrition: Secondary | ICD-10-CM

## 2016-08-30 DIAGNOSIS — R5381 Other malaise: Secondary | ICD-10-CM | POA: Diagnosis not present

## 2016-08-30 NOTE — Patient Instructions (Signed)
Thank you for choosing [facility].  SUMMARY AND INSTRUCTIONS:  Please continue to work on home exercise program and nutrition.   Keep drinking the Boost / Ensure.   Medication:  Discontinue the Pepcid.   Trial without the Flomax (tamsulosin). If needed please restart.   Follow up:  If your symptoms worsen or fail to improve, please contact our office for further instruction, or in case of emergency go directly to the emergency room at the closest medical facility.

## 2016-08-30 NOTE — Progress Notes (Signed)
Pre visit review using our clinic review tool, if applicable. No additional management support is needed unless otherwise documented below in the visit note. 

## 2016-08-30 NOTE — Progress Notes (Signed)
Subjective:    Patient ID: Brenda Gibson, female    DOB: 09-01-1941, 75 y.o.   MRN: [id number]  Chief Complaint  Patient presents with  . Follow-up    physical deconditioning  . Constipation    HPI:  Brenda Gibson is a 75 y.o. female who  has a past medical history of Basal cell carcinoma of ala nasi; Cataract; Knee pain, bilateral; Measles; Myalgia; Seborrheic dermatitis of scalp; and Varicella. and presents today for a follow up office visit.   Physical deconditioning - Reports that she has been out to the store and moving around. Does continue to have some decreased energy at times. She is no longer using the walker. Has been discharged from [facility] for about 2 weeks and reports she was doing well. Nutritionally she is eating, but continues to have little appetite and has been supplementing with the Ensure. Reports the constipation has also resolved and now taking the Metamucil and reports daily bowel movements.   Wt Readings from Last 3 Encounters:  08/30/16 113 lb (51.3 kg)  07/30/16 115 lb (52.2 kg)  07/23/16 151 lb 3.2 oz (68.6 kg)     No Known Allergies    Outpatient Medications Prior to Visit  Medication Sig Dispense Refill  . psyllium (HYDROCIL/METAMUCIL) 95 % PACK Take 1 packet by mouth at bedtime. 15 each 0  . sennosides-docusate sodium (SENOKOT-S) 8.6-50 MG tablet Take 2 tablets by mouth at bedtime.    . tamsulosin (FLOMAX) 0.4 MG CAPS capsule Take 1 capsule (0.4 mg total) by mouth daily. 30 capsule 0  . trifluoperazine (STELAZINE) 5 MG tablet Take 0.5 tablets by mouth every evening. Take 1/2 tablet to = 2.5 mg qd  7  . UNABLE TO FIND Med Name: Med pass 120 mL 2 times daily between meals    . famotidine (PEPCID) 20 MG tablet Take 1 tablet (20 mg total) by mouth 2 (two) times daily. 20 tablet 0  . polyethylene glycol (MIRALAX / GLYCOLAX) packet Take 17 g by mouth daily as needed for mild constipation.     No facility-administered medications prior to visit.      Review of Systems  Constitutional: Positive for appetite change. Negative for chills and fever.  Respiratory: Negative for chest tightness and shortness of breath.   Gastrointestinal: Negative for abdominal pain, constipation, diarrhea, nausea and vomiting.      Objective:    BP 110/82   Pulse 77   Temp 97.6 F (36.4 C)   Wt 113 lb (51.3 kg)   SpO2 98%   BMI 18.80 kg/m  Nursing note and vital signs reviewed.  Physical Exam  Constitutional: She is oriented to person, place, and time. She appears well-developed. She appears cachectic. No distress.  Cardiovascular: Normal rate, regular rhythm, normal heart sounds and intact distal pulses.   Pulmonary/Chest: Effort normal and breath sounds normal.  Abdominal: She exhibits no distension and no mass. There is no tenderness. There is no rebound and no guarding.  Neurological: She is alert and oriented to person, place, and time.  Skin: Skin is warm and dry.  Psychiatric: She has a normal mood and affect. Her behavior is normal. Judgment and thought content normal.       Assessment & Plan:   Problem List Items Addressed This Visit      Other   Malnutrition of moderate degree    Has lost an additional 2 pounds since previous office visit. Recommend continuing to eat frequent small meals  and nutritional supplements between meals. Follow up weight check in 1-2 months. Samples of ensure provided.       Physical deconditioning    Physical deconditioning appears improving is been discharged from home health physical therapy with additional home health exercises. No longer ambulating with the walker. Able to perform most activities of daily living and instrumental activities of daily activity with some fatigue. Continue to monitor.        Other Visit Diagnoses   None.      I have discontinued Ms. Defibaugh's famotidine and polyethylene glycol. I am also having her maintain her trifluoperazine, psyllium, UNABLE TO FIND,  sennosides-docusate sodium, and tamsulosin.   Follow-up: Return in about 1 month (around 09/30/2016) for Nurse visit for weight check. [name], FNP

## 2016-08-30 NOTE — Assessment & Plan Note (Signed)
Has lost an additional 2 pounds since previous office visit. Recommend continuing to eat frequent small meals and nutritional supplements between meals. Follow up weight check in 1-2 months. Samples of ensure provided.

## 2016-08-30 NOTE — Assessment & Plan Note (Signed)
Physical deconditioning appears improving is been discharged from home health physical therapy with additional home health exercises. No longer ambulating with the walker. Able to perform most activities of daily living and instrumental activities of daily activity with some fatigue. Continue to monitor.

## 2016-09-08 IMAGING — DX DG ABDOMEN ACUTE W/ 1V CHEST
3 series · 3 of 3 positions shown · non-contrast
Comparison: Abdominal radiographs 06/25/2016, chest radiograph
06/19/2016

CLINICAL DATA: Small obstruction, abdominal pain and distention,
shortness of breath, weakness

EXAM:
DG ABDOMEN ACUTE W/ 1V CHEST

[chest pa]
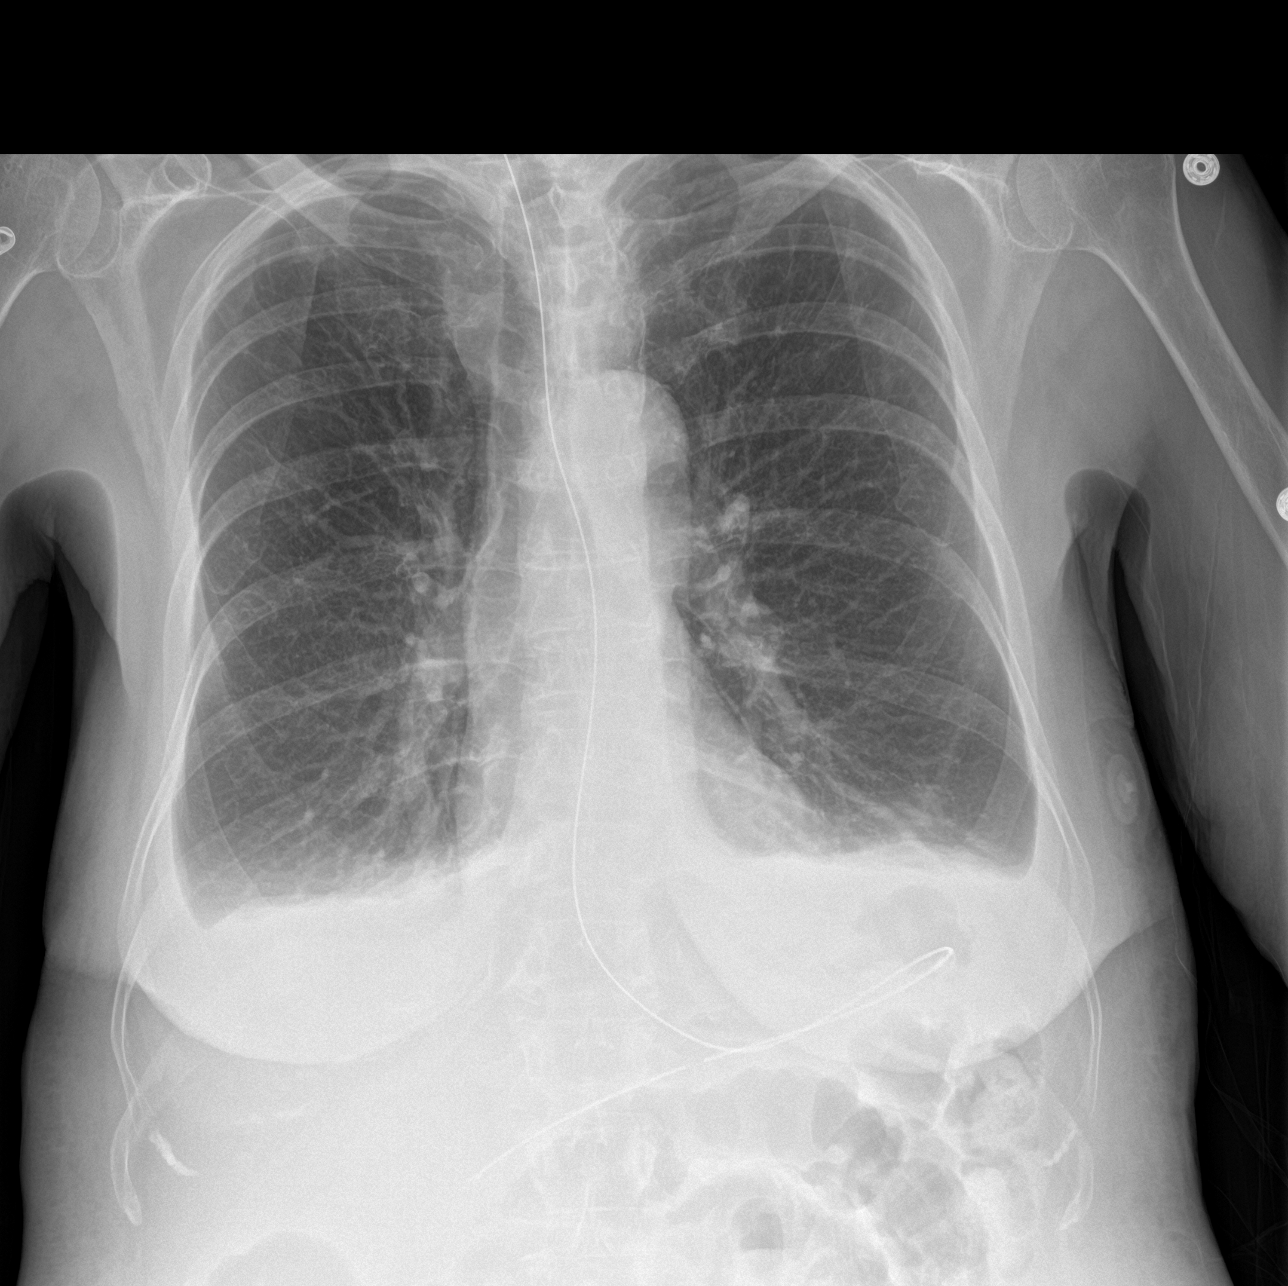

[abdomen erect]
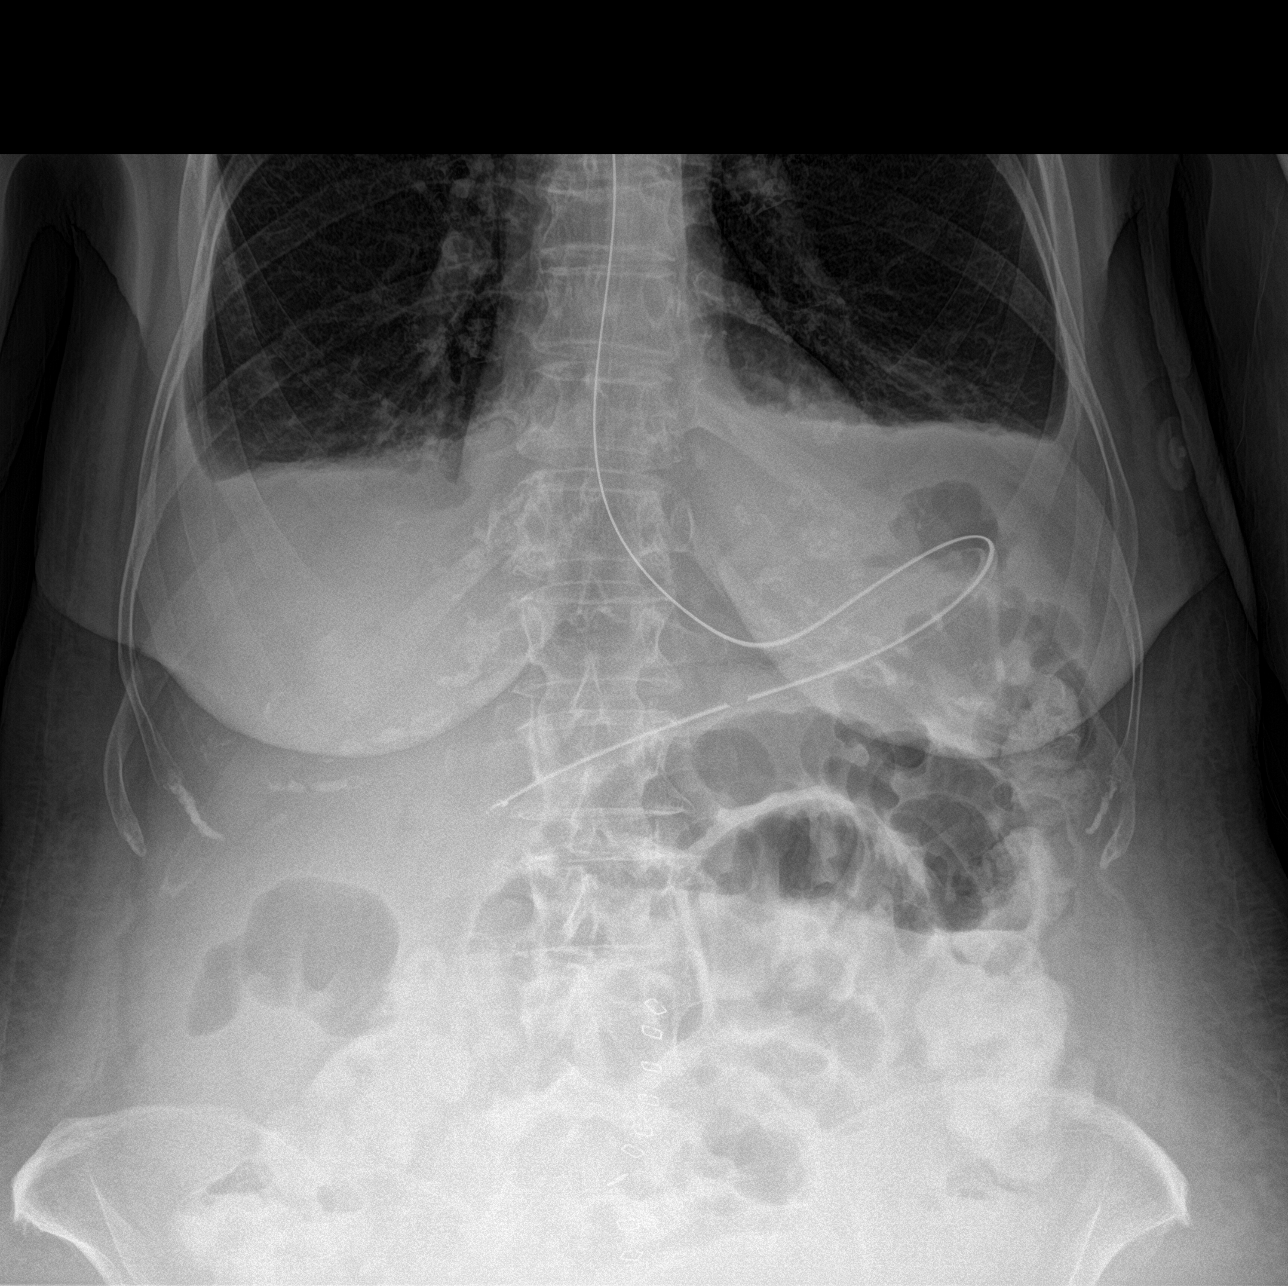

[abdomen supine]
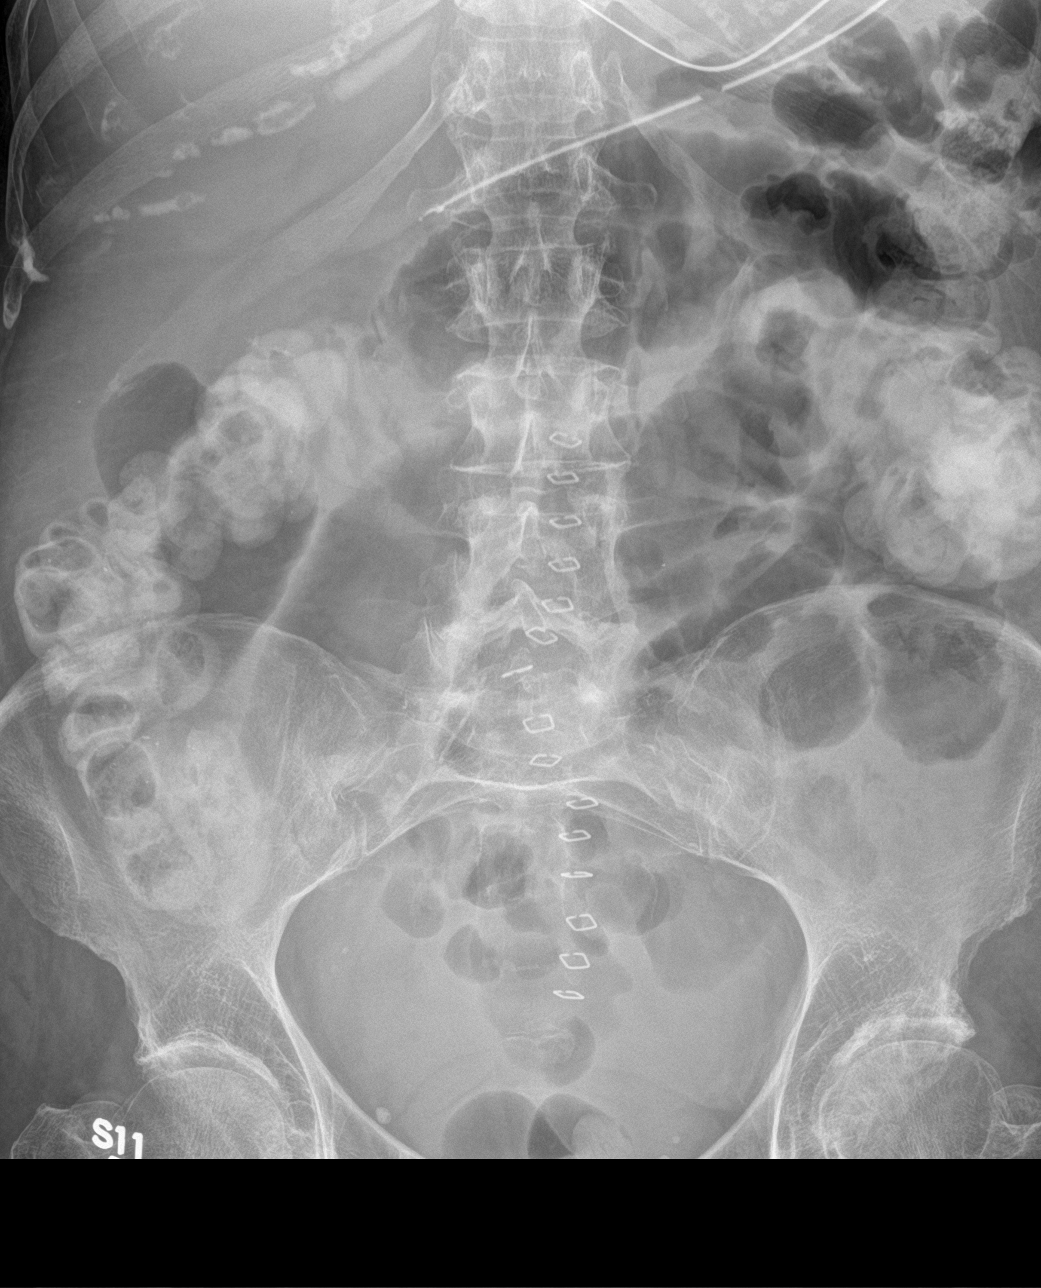

[3 of 3 positions shown; findings below may reference images not displayed]

FINDINGS: Nasogastric tube coiled in stomach with tip at distal antrum.

Normal heart size, mediastinal contours, and pulmonary vascularity.

Emphysematous and bronchitic changes consistent with COPD.

Bibasilar small pleural effusions and atelectasis.

Remaining lungs clear.

No pneumothorax.

Persistent dilatation small bowel loops in the mid abdomen.

Largest of these loops measures 5.4 cm diameter, little changed.

Upper normal wall thickness of loops in LEFT mid abdomen.

Retained contrast in colon.

No free intraperitoneal air.

Bones demineralized.

Surgical clips from laparotomy and lysis adhesions on 06/25/2016.
IMPRESSION: COPD changes with bibasilar pleural effusions and atelectasis.

Persistent dilatation of small bowel loops in the mid abdomen, with
some of the loops in the LEFT mid abdomen demonstrating mild bowel
wall thickening.

Interval laparotomy.

Findings may represent postoperative ileus or persistent
obstruction.

## 2016-09-22 DIAGNOSIS — F259 Schizoaffective disorder, unspecified: Secondary | ICD-10-CM | POA: Diagnosis not present

## 2016-10-15 ENCOUNTER — Ambulatory Visit: Payer: Medicare Other | Admitting: Geriatric Medicine

## 2016-10-15 VITALS — Wt 114.8 lb

## 2016-10-15 DIAGNOSIS — R634 Abnormal weight loss: Secondary | ICD-10-CM

## 2016-10-15 NOTE — Progress Notes (Signed)
Patient came in to have a 2 month check in on her weight, due to weight loss.

## 2016-10-19 DIAGNOSIS — F259 Schizoaffective disorder, unspecified: Secondary | ICD-10-CM | POA: Diagnosis not present

## 2016-11-24 DIAGNOSIS — F259 Schizoaffective disorder, unspecified: Secondary | ICD-10-CM | POA: Diagnosis not present

## 2017-01-18 DIAGNOSIS — F259 Schizoaffective disorder, unspecified: Secondary | ICD-10-CM | POA: Diagnosis not present

## 2017-02-02 ENCOUNTER — Telehealth: Payer: Self-pay | Admitting: Family

## 2017-02-02 NOTE — Telephone Encounter (Signed)
Called patient to schedule awv. Lvm for patient to call office to schedule appt.  °

## 2017-03-03 DIAGNOSIS — F259 Schizoaffective disorder, unspecified: Secondary | ICD-10-CM | POA: Diagnosis not present

## 2017-04-01 DIAGNOSIS — F259 Schizoaffective disorder, unspecified: Secondary | ICD-10-CM | POA: Diagnosis not present

## 2017-05-17 ENCOUNTER — Ambulatory Visit (INDEPENDENT_AMBULATORY_CARE_PROVIDER_SITE_OTHER): Payer: Medicare Other | Admitting: *Deleted

## 2017-05-17 VITALS — BP 112/62 | HR 74 | Resp 20 | Ht 65.0 in | Wt 116.0 lb

## 2017-05-17 DIAGNOSIS — Z0000 Encounter for general adult medical examination without abnormal findings: Secondary | ICD-10-CM | POA: Diagnosis not present

## 2017-05-17 DIAGNOSIS — Z1231 Encounter for screening mammogram for malignant neoplasm of breast: Secondary | ICD-10-CM

## 2017-05-17 NOTE — Patient Instructions (Addendum)
[facility] in [place], [place] Address: [address], [place], [place] [address] Hours: Open ? Closes 5:30PM  Continue doing brain stimulating activities (puzzles, reading, adult coloring books, staying active) to keep memory sharp.   Continue to eat heart healthy diet (full of fruits, vegetables, whole grains, lean protein, water--limit salt, fat, and sugar intake) and increase physical activity as tolerated.   Brenda Gibson , Thank you for taking time to come for your Medicare Wellness Visit. I appreciate your ongoing commitment to your health goals. Please review the following plan we discussed and let me know if I can assist you in the future.   These are the goals we discussed: Goals    . Maintain current health status          Watch diet intake, eat small frequent meals, drink 2 bottles of water a day, increase physical activity by going to the [facility].        This is a list of the screening recommended for you and due dates:  Health Maintenance  Topic Date Due  . Mammogram  11/02/2010  . Flu Shot  06/01/2017  . Cologuard (Stool DNA test)  02/24/2018  . Tetanus Vaccine  08/12/2024  . DEXA scan (bone density measurement)  Completed  . Pneumonia vaccines  Completed

## 2017-05-17 NOTE — Progress Notes (Addendum)
Subjective:   Brenda Gibson is a 76 y.o. female who presents for Medicare Annual (Subsequent) preventive examination.  Review of Systems:  No ROS.  Medicare Wellness Visit. Additional risk factors are reflected in the social history.  Cardiac Risk Factors include: advanced age (>38men, >16 women);dyslipidemia;hypertension;sedentary lifestyle Sleep patterns: feels rested on waking, does not get up to void and sleeps 7-8 hours nightly.    Home Safety/Smoke Alarms: Feels safe in home. Smoke alarms in place.  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 Lives with son, no needs for DME, good support system  Seat Belt Safety/Bike Helmet: Wears seat belt.   Counseling:   Eye Exam- Last 2 years,  Resource provided for eye center close to patient   Dental- appointment yearly, mainly has dentures  Female:   Pap- N/A      Mammo- Last 11/02/08, referral placed today    Dexa scan- Last 08/19/14,  T-score -2.2  CCS- Cologuard 02/17/15, negative, recall 3 years     Objective:     Vitals: BP 112/62   Pulse 74   Resp 20   Ht 5\' 5"  (1.651 m)   Wt 116 lb (52.6 kg)   SpO2 98%   BMI 19.30 kg/m   Body mass index is 19.3 kg/m.   Tobacco History  Smoking Status  . Current Every Day Smoker  . Packs/day: 0.60  . Years: 52.00  . Types: Cigarettes  Smokeless Tobacco  . Never Used    Comment: Pt smokes 5 cigarettes per day     Ready to quit: Not Answered Counseling given: Not Answered   Past Medical History:  Diagnosis Date  . Basal cell carcinoma of ala nasi   . Cataract   . Knee pain, bilateral   . Measles   . Myalgia   . Seborrheic dermatitis of scalp   . Varicella    Past Surgical History:  Procedure Laterality Date  . BASAL CELL CARCINOMA EXCISION     '11 Allyson Sabal)  . CATARACT EXTRACTION W/ INTRAOCULAR LENS IMPLANT     right eye. '09 (Dr. Dolores Lory)  .  LAPAROTOMY N/A 06/25/2016   Procedure: EXPLORATORY LAPAROTOMY, lysis of adhesion;  Surgeon: Coralie Keens, MD;  Location: [facility];  Service: General;  Laterality: N/A;  . LYSIS OF ADHESION  06/25/2016   Dr Nedra Hai   Family History  Problem Relation Age of Onset  . Alcohol abuse Mother   . Cancer Father 42       stomach cancer  . Prostate cancer Father   . Hypertension Maternal Grandfather   . Diabetes Neg Hx   . Heart disease Neg Hx    History  Sexual Activity  . Sexual activity: Not Currently    Outpatient Encounter Prescriptions as of 05/17/2017  Medication Sig  . trifluoperazine (STELAZINE) 5 MG tablet Take 0.5 tablets by mouth every evening. Take 1/2 tablet to = 2.5 mg qd  . psyllium (HYDROCIL/METAMUCIL) 95 % PACK Take 1 packet by mouth at bedtime.  . [DISCONTINUED] sennosides-docusate sodium (SENOKOT-S) 8.6-50 MG tablet Take 2 tablets by mouth at bedtime.  . [DISCONTINUED] tamsulosin (FLOMAX) 0.4 MG CAPS capsule Take 1 capsule (0.4 mg total) by mouth daily. (Patient not taking: Reported on 05/17/2017)  . [DISCONTINUED] UNABLE TO FIND Med Name: Med pass 120 mL 2 times daily between meals   No facility-administered encounter medications on file as of 05/17/2017.  Activities of Daily Living In your present state of health, do you have any difficulty performing the following activities: 05/17/2017 06/19/2016  Hearing? N N  Vision? N N  Difficulty concentrating or making decisions? N N  Walking or climbing stairs? N Y  Dressing or bathing? N N  Doing errands, shopping? N N  Preparing Food and eating ? N -  Using the Toilet? N -  In the past six months, have you accidently leaked urine? Y -  Do you have problems with loss of bowel control? N -  Managing your Medications? N -  Managing your Finances? N -  Housekeeping or managing your Housekeeping? N -  Some recent data might be hidden    Patient Care Team: Golden Circle, FNP as PCP - General (Family  Medicine) Zadie Rhine Clent Demark, MD as Consulting Physician (Ophthalmology)    Assessment:    Physical assessment deferred to PCP.  Exercise Activities and Dietary recommendations Current Exercise Habits: The patient does not participate in regular exercise at present, Exercise limited by: None identified Encouraged patient to increase her physical activity  Diet (meal preparation, eat out, water intake, caffeinated beverages, dairy products, fruits and vegetables): in general, a "healthy" diet  , on average, 2 meals per day, reports poor appetite, drinks 3-4 glasses of tea or coffee daily, drinks 1 bottle of water daily.   Discussed eating small frequent meals and gave list of healthy food items to help gain and maintain weight, encouraged patient to increase daily water intake. Reviewed heart healthy diet. Discussed supplementing with ensure or boost, patient states she cannot tolerate and does not drink supplements.   Goals    . Maintain current health status          Watch diet intake, eat small frequent meals, drink 2 bottles of water a day, increase physical activity by going to the [facility].       Fall Risk Fall Risk  05/17/2017 02/23/2016  Falls in the past year? No No   Depression Screen PHQ 2/9 Scores 05/17/2017 02/23/2016  PHQ - 2 Score 2 0  PHQ- 9 Score 7 -     Cognitive Function MMSE - Mini Mental State Exam 05/17/2017  Orientation to time 5  Orientation to Place 5  Registration 3  Attention/ Calculation 4  Recall 2  Language- name 2 objects 2  Language- repeat 1  Language- follow 3 step command 3  Language- read & follow direction 1  Write a sentence 1  Copy design 1  Total score 28        Immunization History  Administered Date(s) Administered  . Influenza Whole 08/01/2010, 08/15/2012  . Influenza, High Dose Seasonal PF 07/24/2013, 07/15/2014, 07/30/2016  . Influenza-Unspecified 07/24/2013  . PPD Test 07/08/2016  . Pneumococcal Conjugate-13 08/12/2014   . Pneumococcal Polysaccharide-23 10/01/2008  . Tdap 01/01/2004  . Tetanus 08/12/2014  . Zoster 10/05/2008   Screening Tests Health Maintenance  Topic Date Due  . MAMMOGRAM  11/02/2010  . INFLUENZA VACCINE  06/01/2017  . Fecal DNA (Cologuard)  02/24/2018  . TETANUS/TDAP  08/12/2024  . DEXA SCAN  Completed  . PNA vac Low Risk Adult  Completed      Plan:    Continue doing brain stimulating activities (puzzles, reading, adult coloring books, staying active) to keep memory sharp.   Continue to eat heart healthy diet (full of fruits, vegetables, whole grains, lean protein, water--limit salt, fat, and sugar intake) and increase physical activity as tolerated.  I have personally reviewed and noted the following in the patient's chart:   . Medical and social history . Use of alcohol, tobacco or illicit drugs  . Current medications and supplements . Functional ability and status . Nutritional status . Physical activity . Advanced directives . List of other physicians . Vitals . Screenings to include cognitive, depression, and falls . Referrals and appointments  In addition, I have reviewed and discussed with patient certain preventive protocols, quality metrics, and best practice recommendations. A written personalized care plan for preventive services as well as general preventive health recommendations were provided to patient.     Michiel Cowboy, RN  05/17/2017   Medical screening examination/treatment/procedure(s) were performed by non-physician practitioner and as supervising provider I was immediately available for consultation/collaboration. I agree with treatment plan. [name], FNP

## 2017-05-17 NOTE — Progress Notes (Signed)
Pre visit review using our clinic review tool, if applicable. No additional management support is needed unless otherwise documented below in the visit note. 

## 2017-06-16 DIAGNOSIS — F259 Schizoaffective disorder, unspecified: Secondary | ICD-10-CM | POA: Diagnosis not present

## 2017-07-11 DIAGNOSIS — Z23 Encounter for immunization: Secondary | ICD-10-CM | POA: Diagnosis not present

## 2017-11-04 DIAGNOSIS — F259 Schizoaffective disorder, unspecified: Secondary | ICD-10-CM | POA: Diagnosis not present

## 2018-03-23 ENCOUNTER — Ambulatory Visit (INDEPENDENT_AMBULATORY_CARE_PROVIDER_SITE_OTHER): Payer: Medicare Other | Admitting: Nurse Practitioner

## 2018-03-23 ENCOUNTER — Encounter: Payer: Self-pay | Admitting: Nurse Practitioner

## 2018-03-23 VITALS — BP 158/96 | HR 68 | Temp 98.4°F | Resp 18 | Ht 65.0 in | Wt 113.0 lb

## 2018-03-23 DIAGNOSIS — R030 Elevated blood-pressure reading, without diagnosis of hypertension: Secondary | ICD-10-CM | POA: Diagnosis not present

## 2018-03-23 DIAGNOSIS — R21 Rash and other nonspecific skin eruption: Secondary | ICD-10-CM | POA: Diagnosis not present

## 2018-03-23 MED ORDER — TRIAMCINOLONE ACETONIDE 0.1 % EX CREA: 1.0000 "application " | TOPICAL_CREAM | Freq: Two times a day (BID) | CUTANEOUS | 0 refills | Status: DC
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
## 2018-03-23 NOTE — Patient Instructions (Addendum)
Please return in about 2 weeks so we can recheck your blood pressure and recheck your rash.  I have sent a prescription strength steroid cream called kenalog to apply to your rash twice daily. If the rash continues to spread, you develop fevers or shortness of breath, or the rash is not better in about 1 week please follow up sooner.   Rash A rash is a change in the color of the skin. A rash can also change the way your skin feels. There are many different conditions and factors that can cause a rash. Follow these instructions at home: Pay attention to any changes in your symptoms. Follow these instructions to help with your condition: Medicine Take or apply over-the-counter and prescription medicines only as told by your doctor. These may include:  Corticosteroid cream.  Anti-itch lotions.  Oral antihistamines.  Skin Care  Put cool compresses on the affected areas.  Try taking a bath with: ? Epsom salts. Follow the instructions on the packaging. You can get these at your local pharmacy or grocery store. ? Baking soda. Pour a small amount into the bath as told by your doctor. ? Colloidal oatmeal. Follow the instructions on the packaging. You can get this at your local pharmacy or grocery store.  Try putting baking soda paste onto your skin. Stir water into baking soda until it gets like a paste.  Do not scratch or rub your skin.  Avoid covering the rash. Make sure the rash is exposed to air as much as possible. General instructions  Avoid hot showers or baths, which can make itching worse. A cold shower may help.  Avoid scented soaps, detergents, and perfumes. Use gentle soaps, detergents, perfumes, and other cosmetic products.  Avoid anything that causes your rash. Keep a journal to help track what causes your rash. Write down: ? What you eat. ? What cosmetic products you use. ? What you drink. ? What you wear. This includes jewelry.  Keep all follow-up visits as told by  your doctor. This is important. Contact a doctor if:  You sweat at night.  You lose weight.  You pee (urinate) more than normal.  You feel weak.  You throw up (vomit).  Your skin or the whites of your eyes look yellow (jaundice).  Your skin: ? Tingles. ? Is numb.  Your rash: ? Does not go away after a few days. ? Gets worse.  You are: ? More thirsty than normal. ? More tired than normal.  You have: ? New symptoms. ? Pain in your belly (abdomen). ? A fever. ? Watery poop (diarrhea). Get help right away if:  Your rash covers all or most of your body. The rash may or may not be painful.  You have blisters that: ? Are on top of the rash. ? Grow larger. ? Grow together. ? Are painful. ? Are inside your nose or mouth.  You have a rash that: ? Looks like purple pinprick-sized spots all over your body. ? Has a "bull's eye" or looks like a target. ? Is red and painful, causes your skin to peel, and is not from being in the sun too long. This information is not intended to replace advice given to you by your health care provider. Make sure you discuss any questions you have with your health care provider. Document Released: 04/05/2008 Document Revised: 03/25/2016 Document Reviewed: 03/05/2015 Elsevier Interactive Patient Education  2018 [place] Eating Plan DASH stands for "Dietary Approaches  to Stop Hypertension." The DASH eating plan is a healthy eating plan that has been shown to reduce high blood pressure (hypertension). It may also reduce your risk for type 2 diabetes, heart disease, and stroke. The DASH eating plan may also help with weight loss. What are tips for following this plan? General guidelines  Avoid eating more than 2,300 mg (milligrams) of salt (sodium) a day. If you have hypertension, you may need to reduce your sodium intake to 1,500 mg a day.  Limit alcohol intake to no more than 1 drink a day for nonpregnant women and 2 drinks a  day for men. One drink equals 12 oz of beer, 5 oz of wine, or 1 oz of hard liquor.  Work with your health care provider to maintain a healthy body weight or to lose weight. Ask what an ideal weight is for you.  Get at least 30 minutes of exercise that causes your heart to beat faster (aerobic exercise) most days of the week. Activities may include walking, swimming, or biking.  Work with your health care provider or diet and nutrition specialist (dietitian) to adjust your eating plan to your individual calorie needs. Reading food labels  Check food labels for the amount of sodium per serving. Choose foods with less than 5 percent of the Daily Value of sodium. Generally, foods with less than 300 mg of sodium per serving fit into this eating plan.  To find whole grains, look for the word "whole" as the first word in the ingredient list. Shopping  Buy products labeled as "low-sodium" or "no salt added."  Buy fresh foods. Avoid canned foods and premade or frozen meals. Cooking  Avoid adding salt when cooking. Use salt-free seasonings or herbs instead of table salt or sea salt. Check with your health care provider or pharmacist before using salt substitutes.  Do not fry foods. Cook foods using healthy methods such as baking, boiling, grilling, and broiling instead.  Cook with heart-healthy oils, such as olive, canola, soybean, or sunflower oil. Meal planning   Eat a balanced diet that includes: ? 5 or more servings of fruits and vegetables each day. At each meal, try to fill half of your plate with fruits and vegetables. ? Up to 6-8 servings of whole grains each day. ? Less than 6 oz of lean meat, poultry, or fish each day. A 3-oz serving of meat is about the same size as a deck of cards. One egg equals 1 oz. ? 2 servings of low-fat dairy each day. ? A serving of nuts, seeds, or beans 5 times each week. ? Heart-healthy fats. Healthy fats called Omega-3 fatty acids are found in foods such  as flaxseeds and coldwater fish, like sardines, salmon, and mackerel.  Limit how much you eat of the following: ? Canned or prepackaged foods. ? Food that is high in trans fat, such as fried foods. ? Food that is high in saturated fat, such as fatty meat. ? Sweets, desserts, sugary drinks, and other foods with added sugar. ? Full-fat dairy products.  Do not salt foods before eating.  Try to eat at least 2 vegetarian meals each week.  Eat more home-cooked food and less restaurant, buffet, and fast food.  When eating at a restaurant, ask that your food be prepared with less salt or no salt, if possible. What foods are recommended? The items listed may not be a complete list. Talk with your dietitian about what dietary choices are best for you. Grains  Whole-grain or whole-wheat bread. Whole-grain or whole-wheat pasta. Brown rice. [place]. Bulgur. Whole-grain and low-sodium cereals. Pita bread. Low-fat, low-sodium crackers. Whole-wheat flour tortillas. Vegetables Fresh or frozen vegetables (raw, steamed, roasted, or grilled). Low-sodium or reduced-sodium tomato and vegetable juice. Low-sodium or reduced-sodium tomato sauce and tomato paste. Low-sodium or reduced-sodium canned vegetables. Fruits All fresh, dried, or frozen fruit. Canned fruit in natural juice (without added sugar). Meat and other protein foods Skinless chicken or [place]. Ground chicken or [place]. Pork with fat trimmed off. Fish and seafood. Egg whites. Dried beans, peas, or lentils. Unsalted nuts, nut butters, and seeds. Unsalted canned beans. Lean cuts of beef with fat trimmed off. Low-sodium, lean deli meat. Dairy Low-fat (1%) or fat-free (skim) milk. Fat-free, low-fat, or reduced-fat cheeses. Nonfat, low-sodium ricotta or cottage cheese. Low-fat or nonfat yogurt. Low-fat, low-sodium cheese. Fats and oils Soft margarine without trans fats. Vegetable oil. Low-fat, reduced-fat, or light mayonnaise and salad dressings  (reduced-sodium). Canola, safflower, olive, soybean, and sunflower oils. Avocado. Seasoning and other foods Herbs. Spices. Seasoning mixes without salt. Unsalted popcorn and pretzels. Fat-free sweets. What foods are not recommended? The items listed may not be a complete list. Talk with your dietitian about what dietary choices are best for you. Grains Baked goods made with fat, such as croissants, muffins, or some breads. Dry pasta or rice meal packs. Vegetables Creamed or fried vegetables. Vegetables in a cheese sauce. Regular canned vegetables (not low-sodium or reduced-sodium). Regular canned tomato sauce and paste (not low-sodium or reduced-sodium). Regular tomato and vegetable juice (not low-sodium or reduced-sodium). Angie Fava. Olives. Fruits Canned fruit in a light or heavy syrup. Fried fruit. Fruit in cream or butter sauce. Meat and other protein foods Fatty cuts of meat. Ribs. Fried meat. Berniece Salines. Sausage. Bologna and other processed lunch meats. Salami. Fatback. Hotdogs. Bratwurst. Salted nuts and seeds. Canned beans with added salt. Canned or smoked fish. Whole eggs or egg yolks. Chicken or [place] with skin. Dairy Whole or 2% milk, cream, and half-and-half. Whole or full-fat cream cheese. Whole-fat or sweetened yogurt. Full-fat cheese. Nondairy creamers. Whipped toppings. Processed cheese and cheese spreads. Fats and oils Butter. Stick margarine. Lard. Shortening. Ghee. Bacon fat. Tropical oils, such as coconut, palm kernel, or palm oil. Seasoning and other foods Salted popcorn and pretzels. Onion salt, garlic salt, seasoned salt, table salt, and sea salt. Worcestershire sauce. Tartar sauce. Barbecue sauce. Teriyaki sauce. Soy sauce, including reduced-sodium. Steak sauce. Canned and packaged gravies. Fish sauce. Oyster sauce. Cocktail sauce. Horseradish that you find on the shelf. Ketchup. Mustard. Meat flavorings and tenderizers. Bouillon cubes. Hot sauce and Tabasco sauce. Premade or  packaged marinades. Premade or packaged taco seasonings. Relishes. Regular salad dressings. Where to find more information:  National Heart, Lung, and [place]: https://wilson-eaton.com/  American Heart Association: www.heart.org Summary  The DASH eating plan is a healthy eating plan that has been shown to reduce high blood pressure (hypertension). It may also reduce your risk for type 2 diabetes, heart disease, and stroke.  With the DASH eating plan, you should limit salt (sodium) intake to 2,300 mg a day. If you have hypertension, you may need to reduce your sodium intake to 1,500 mg a day.  When on the DASH eating plan, aim to eat more fresh fruits and vegetables, whole grains, lean proteins, low-fat dairy, and heart-healthy fats.  Work with your health care provider or diet and nutrition specialist (dietitian) to adjust your eating plan to your individual calorie needs. This information is not intended  to replace advice given to you by your health care provider. Make sure you discuss any questions you have with your health care provider. Document Released: 10/07/2011 Document Revised: 10/11/2016 Document Reviewed: 10/11/2016 Elsevier Interactive Patient Education  [employer].

## 2018-04-03 ENCOUNTER — Ambulatory Visit: Payer: Self-pay

## 2018-04-03 ENCOUNTER — Other Ambulatory Visit: Payer: Self-pay

## 2018-04-03 ENCOUNTER — Encounter (HOSPITAL_COMMUNITY): Payer: Self-pay | Admitting: Emergency Medicine

## 2018-04-03 ENCOUNTER — Emergency Department (HOSPITAL_COMMUNITY)
Admission: EM | Admit: 2018-04-03 | Discharge: 2018-04-03 | Disposition: A | Discharge status: Home / Self Care | Payer: Medicare Other | Attending: Emergency Medicine | Admitting: Emergency Medicine

## 2018-04-03 DIAGNOSIS — F17210 Nicotine dependence, cigarettes, uncomplicated: Secondary | ICD-10-CM | POA: Insufficient documentation

## 2018-04-03 DIAGNOSIS — I10 Essential (primary) hypertension: Secondary | ICD-10-CM | POA: Diagnosis not present

## 2018-04-03 MED ORDER — AMLODIPINE BESYLATE 5 MG PO TABS: 5.0000 mg | ORAL_TABLET | Freq: Every day | ORAL | 0 refills | Status: DC

## 2018-04-03 NOTE — ED Provider Notes (Signed)
[facility] DEPT Provider Note   CSN: [id number] Arrival date & time: 04/03/18  1650     History   Chief Complaint Chief Complaint  Patient presents with  . Hypertension    HPI Brenda Gibson is a 77 y.o. female.  HPI   67yF with HTN. Noted to be high on several checks over several days recently. She really has no other complaints. Denies chest, HA, swelling, urinary symptoms, dyspnea, etc. Was concerned when systolic BP got to 505L so came to ED.   Past Medical History:  Diagnosis Date  . Basal cell carcinoma of ala nasi   . Cataract   . Knee pain, bilateral   . Measles   . Myalgia   . Seborrheic dermatitis of scalp   . Varicella     Patient Active Problem List   Diagnosis Date Noted  . Physical deconditioning 07/30/2016  . Small bowel obstruction ([place])   . Hypokalemia 07/04/2016  . Hypomagnesemia 07/04/2016  . Urinary retention 07/04/2016  . Ileus, postoperative ([place]) 07/02/2016  . Malnutrition of moderate degree 06/28/2016  . SBO (small bowel obstruction) s/p lysis of adhesion 06/25/2016 06/21/2016  . Enteritis 06/21/2016  . 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 Elevated blood pressure reading without diagnosis of hypertension 06/19/2016  . Medicare annual wellness visit, subsequent 02/23/2016  . Female bladder prolapse 09/03/2014  . Routine general medical examination at a health care facility 08/12/2014  . At risk for osteoporosis/osteopenia 08/12/2014  . Overactive bladder 08/02/2013  . Essential hypertension, benign 06/05/2013  . 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 Ganglion cyst of wrist 02/22/2013  . Tobacco abuse 02/24/2011    Past Surgical History:  Procedure Laterality Date  . BASAL CELL CARCINOMA EXCISION     '11 Allyson Sabal)  . CATARACT EXTRACTION W/ INTRAOCULAR LENS IMPLANT     right eye. '09 (Dr. Dolores Lory)  . LAPAROTOMY N/A 06/25/2016   Procedure: EXPLORATORY LAPAROTOMY, lysis of adhesion;  Surgeon: Coralie Keens, MD;   Location: [facility] ORS;  Service: General;  Laterality: N/A;  . LYSIS OF ADHESION  06/25/2016   Dr Nedra Hai     OB History   None      Home Medications    Prior to Admission medications   Medication Sig Start Date End Date Taking? Authorizing Provider  triamcinolone cream (KENALOG) 0.1 % Apply 1 application topically 2 (two) times daily. 03/23/18   Lance Sell, NP  trifluoperazine (STELAZINE) 5 MG tablet Take 0.5 tablets by mouth every evening. Take 1/2 tablet to = 2.5 mg qd 06/04/16   [provider]    Family History Family History  Problem Relation Age of Onset  . Alcohol abuse Mother   . Cancer Father 68       stomach cancer  . Prostate cancer Father   . Hypertension Maternal Grandfather   . Diabetes Neg Hx   . Heart disease Neg Hx     Social History Social History   Tobacco Use  . Smoking status: Current Every Day Smoker    Packs/day: 0.60    Years: 52.00    Pack years: 31.20    Types: Cigarettes  . Smokeless tobacco: Never Used  . Tobacco comment: Pt smokes 5 cigarettes per day  Substance Use Topics  . Alcohol use: Yes    Comment: rare use  . Drug use: No     Allergies   Patient has no known allergies.   Review of Systems Review of Systems  All systems  reviewed and negative, other than as noted in HPI.  Physical Exam Updated Vital Signs BP (!) 181/93 (BP Location: Right Arm)   Pulse 66   Temp 97.7 F (36.5 C) (Oral)   Resp 12   Ht 5\' 5"  (1.651 m)   Wt 51.3 kg (113 lb)   SpO2 97%   BMI 18.80 kg/m   Physical Exam  Constitutional: She appears well-developed and well-nourished. No distress.  HENT:  Head: Normocephalic and atraumatic.  Eyes: Conjunctivae are normal. Right eye exhibits no discharge. Left eye exhibits no discharge.  Neck: Neck supple.  Cardiovascular: Normal rate, regular rhythm and normal heart sounds. Exam reveals no gallop and no friction rub.  No murmur heard. Pulmonary/Chest: Effort normal and breath  sounds normal. No respiratory distress.  Abdominal: Soft. She exhibits no distension. There is no tenderness.  Musculoskeletal: She exhibits no edema or tenderness.  Neurological: She is alert.  Skin: Skin is warm and dry.  Psychiatric: She has a normal mood and affect. Her behavior is normal. Thought content normal.  Nursing note and vitals reviewed.    ED Treatments / Results  Labs (all labs ordered are listed, but only abnormal results are displayed) Labs Reviewed - No data to display  EKG None  Radiology No results found.  Procedures Procedures (including critical care time)  Medications Ordered in ED Medications - No data to display   Initial Impression / Assessment and Plan / ED Course  I have reviewed the triage vital signs and the nursing notes.  Pertinent labs & imaging results that were available during my care of the patient were reviewed by me and considered in my medical decision making (see chart for details).     77 year old female with hypertension.  She has now been hypertensive when checked several times over several day.  PCP, at home, drugstore and again here today.  Scuffs options with her.  She has an upcoming appointment on Friday for recheck.  Advised she can wait until then to discuss further PCP in regards to her on the medication today.  I think both options are reasonable.  Her and her son are somewhat concerned with her readings.  I'll will start her on Norvasc for now and defer further management to PCPs discretion.  Advised to continue to keep a log of her blood pressure taken to her upcoming appointment.  Return precautions discussed.  Final Clinical Impressions(s) / ED Diagnoses   Final diagnoses:  Hypertension, unspecified type    ED Discharge Orders    None       Virgel Manifold, MD 04/05/18 1234

## 2018-04-03 NOTE — ED Triage Notes (Signed)
Pt reports taking her BP this morning and was 025-852D systolic when at PCP this am when seen for a rash.  Is scheduled to come back on Friday for re-check and been using her friend's monitor to watch it at home. Denies Hx of HTN.

## 2018-04-03 NOTE — ED Notes (Signed)
Pt is alert and oriented x 4 and is verbally responsive. Pt reports that she has High blood pressure Pt  Reports that she does not take medication for BP. Pt reports that she does not have HTN normally. Pt denies  pain at this time

## 2018-04-03 NOTE — Telephone Encounter (Signed)
Pt and pt's son calling to report 2 week h/o high blood pressure. Pt states that her BP is running 180/90 or above. Pt is c/o lightheadedness and is SOB. Pt has no h/o HTN and is not currently taking any BP meds.  Today's BP 184/96, 180/96, 184/91.  Advised pt to be driven to the ED. Son will drive pt to [facility] ED. Care advice given per protocol.  Reason for Disposition . [7] Systolic BP  >= 943 OR Diastolic >= 276 AND [1] cardiac or neurologic symptoms (e.g., chest pain, difficulty breathing, unsteady gait, blurred vision)  Answer Assessment - Initial Assessment Questions 1. BLOOD PRESSURE: "What is the blood pressure?" "Did you take at least two measurements 5 minutes apart?"     184/96 180/96 184/91 2. ONSET: "When did you take your blood pressure?"     This am   3. HOW: "How did you obtain the blood pressure?" (e.g., visiting nurse, automatic home BP monitor)     Automatic home BP machine 4. HISTORY: "Do you have a history of high blood pressure?"     no 5. MEDICATIONS: "Are you taking any medications for blood pressure?" "Have you missed any doses recently?"     no 6. OTHER SYMPTOMS: "Do you have any symptoms?" (e.g., headache, chest pain, blurred vision, difficulty breathing, weakness)     Son states that she is SOB and  feels lightheaded- no headache or chest pain 7. PREGNANCY: "Is there any chance you are pregnant?" "When was your last menstrual period?"     n/a  Protocols used: HIGH BLOOD PRESSURE-A-AH

## 2018-04-07 ENCOUNTER — Ambulatory Visit (INDEPENDENT_AMBULATORY_CARE_PROVIDER_SITE_OTHER): Payer: Medicare Other | Admitting: Nurse Practitioner

## 2018-04-07 ENCOUNTER — Encounter: Payer: Self-pay | Admitting: Nurse Practitioner

## 2018-04-07 VITALS — BP 140/80 | HR 69 | Temp 97.7°F | Ht 65.0 in | Wt 112.5 lb

## 2018-04-07 DIAGNOSIS — Z1212 Encounter for screening for malignant neoplasm of rectum: Secondary | ICD-10-CM | POA: Diagnosis not present

## 2018-04-07 DIAGNOSIS — R21 Rash and other nonspecific skin eruption: Secondary | ICD-10-CM

## 2018-04-07 DIAGNOSIS — I10 Essential (primary) hypertension: Secondary | ICD-10-CM | POA: Diagnosis not present

## 2018-04-07 DIAGNOSIS — Z1211 Encounter for screening for malignant neoplasm of colon: Secondary | ICD-10-CM
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
## 2018-04-07 NOTE — Patient Instructions (Addendum)
Please try to check your blood pressure once daily or at least a few times a week, at the same time each day, and keep a log. Please return in about 3 weeks with your blood pressure monitor and we will recheck your blood pressure.  We can also update annual labs at your next visit.  It looks like your wellness visit with jill is due after 7/17.  It was good to see you. Thanks for letting me take care of you today

## 2018-04-07 NOTE — Assessment & Plan Note (Addendum)
BP appears to be improving on amlodipine Continue current dosage Encouraged to log BP readings at home RTC in 3 weeks for F/U: recheck BP, calibrate home monitor

## 2018-04-13 DIAGNOSIS — F259 Schizoaffective disorder, unspecified: Secondary | ICD-10-CM | POA: Diagnosis not present

## 2018-04-28 ENCOUNTER — Other Ambulatory Visit (INDEPENDENT_AMBULATORY_CARE_PROVIDER_SITE_OTHER): Payer: Medicare Other

## 2018-04-28 ENCOUNTER — Ambulatory Visit (INDEPENDENT_AMBULATORY_CARE_PROVIDER_SITE_OTHER): Payer: Medicare Other | Admitting: Nurse Practitioner

## 2018-04-28 ENCOUNTER — Encounter: Payer: Self-pay | Admitting: Nurse Practitioner

## 2018-04-28 VITALS — BP 158/86 | HR 69 | Ht 65.0 in | Wt 112.0 lb

## 2018-04-28 DIAGNOSIS — I10 Essential (primary) hypertension: Secondary | ICD-10-CM

## 2018-04-28 LAB — CBC
HEMATOCRIT: 45 % (ref 36.0–46.0)
HEMOGLOBIN: 15 g/dL (ref 12.0–15.0)
MCHC: 33.4 g/dL (ref 30.0–36.0)
MCV: 87.9 fl (ref 78.0–100.0)
PLATELETS: 288 10*3/uL (ref 150.0–400.0)
RBC: 5.12 Mil/uL — AB (ref 3.87–5.11)
RDW: 14.5 % (ref 11.5–15.5)
WBC: 8.2 10*3/uL (ref 4.0–10.5)

## 2018-04-28 LAB — LIPID PANEL
Cholesterol: 226 mg/dL — ABNORMAL HIGH (ref 0–200)
HDL: 100.3 mg/dL (ref >39.00)
LDL Cholesterol: 90 mg/dL (ref 0–99)
NonHDL: 125.37
TRIGLYCERIDES: 177 mg/dL — AB (ref 0.0–149.0)
Total CHOL/HDL Ratio: 2
VLDL: 35.4 mg/dL (ref 0.0–40.0)

## 2018-04-28 LAB — COMPREHENSIVE METABOLIC PANEL
ALBUMIN: 4.3 g/dL (ref 3.5–5.2)
ALT: 10 U/L (ref 0–35)
AST: 13 U/L (ref 0–37)
Alkaline Phosphatase: 68 U/L (ref 39–117)
BILIRUBIN TOTAL: 0.4 mg/dL (ref 0.2–1.2)
BUN: 10 mg/dL (ref 6–23)
CALCIUM: 9.6 mg/dL (ref 8.4–10.5)
CO2: 27 meq/L (ref 19–32)
CREATININE: 0.63 mg/dL (ref 0.40–1.20)
Chloride: 101 mEq/L (ref 96–112)
GFR: 97.9 mL/min (ref >60.00)
Glucose, Bld: 99 mg/dL (ref 70–99)
Potassium: 4 mEq/L (ref 3.5–5.1)
SODIUM: 139 meq/L (ref 135–145)
Total Protein: 6.9 g/dL (ref 6.0–8.3)

## 2018-04-28 LAB — TSH: TSH: 1.18 u[IU]/mL (ref 0.35–4.50)

## 2018-04-28 MED ORDER — AMLODIPINE BESYLATE 10 MG PO TABS: 10.0000 mg | ORAL_TABLET | Freq: Every day | ORAL | 1 refills | Status: DC

## 2018-04-28 NOTE — Assessment & Plan Note (Signed)
BP remains above goal at home and in office today She is tolerating amlodipine well, dosage increased to 10mg  daily Continue to monitor BP readings at home RTC in 3 weeks for F/U - CBC; Future - Comprehensive metabolic panel; Future - TSH; Future - Lipid panel; Future - amLODipine (NORVASC) 10 MG tablet; Take 1 tablet (10 mg total) by mouth daily.  Dispense: 30 tablet; Refill: 1
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
## 2018-04-28 NOTE — Patient Instructions (Signed)
Please head downstairs for lab work. If any of your test results are critically abnormal, you will be contacted right away. Otherwise, I will contact you within a week about your test results and any recommendations for abnormalities.  Please increase your amlodipine dosage to 10mg  once daily. Please try to check your blood pressure once daily or at least a few times a week, at the same time each day, and keep a log. Your blood pressure goal is less than 140/90.  Please come back in about 3-4 weeks, so I can see how you are doing on the medication adjustment.

## 2018-05-08 DIAGNOSIS — Z1212 Encounter for screening for malignant neoplasm of rectum: Secondary | ICD-10-CM | POA: Diagnosis not present

## 2018-05-08 DIAGNOSIS — Z1211 Encounter for screening for malignant neoplasm of colon: Secondary | ICD-10-CM | POA: Diagnosis not present

## 2018-05-19 ENCOUNTER — Other Ambulatory Visit: Payer: Self-pay

## 2018-05-19 DIAGNOSIS — R21 Rash and other nonspecific skin eruption: Secondary | ICD-10-CM

## 2018-05-19 MED ORDER — TRIAMCINOLONE ACETONIDE 0.1 % EX CREA: 1.0000 "application " | TOPICAL_CREAM | Freq: Two times a day (BID) | CUTANEOUS | 0 refills | Status: DC

## 2018-05-25 ENCOUNTER — Encounter: Payer: Self-pay | Admitting: Nurse Practitioner

## 2018-05-25 ENCOUNTER — Ambulatory Visit (INDEPENDENT_AMBULATORY_CARE_PROVIDER_SITE_OTHER): Payer: Medicare Other | Admitting: Nurse Practitioner

## 2018-05-25 VITALS — BP 130/80 | HR 64 | Temp 98.1°F | Resp 16 | Ht 65.0 in | Wt 111.0 lb

## 2018-05-25 DIAGNOSIS — I10 Essential (primary) hypertension: Secondary | ICD-10-CM | POA: Diagnosis not present
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
## 2018-05-25 NOTE — Patient Instructions (Signed)
Please try to check your blood pressure once daily or at least a few times a week, at the same time each day, and keep a log.  Please return in about 3 months so we can recheck your blood pressure and make sure you are still doing okay, or sooner for blood pressure readings over 140/90.   Hypertension Hypertension is another name for high blood pressure. High blood pressure forces your heart to work harder to pump blood. This can cause problems over time. There are two numbers in a blood pressure reading. There is a top number (systolic) over a bottom number (diastolic). It is best to have a blood pressure below 120/80. Healthy choices can help lower your blood pressure. You may need medicine to help lower your blood pressure if:  Your blood pressure cannot be lowered with healthy choices.  Your blood pressure is higher than 130/80.  Follow these instructions at home: Eating and drinking  If directed, follow the DASH eating plan. This diet includes: ? Filling half of your plate at each meal with fruits and vegetables. ? Filling one quarter of your plate at each meal with whole grains. Whole grains include whole wheat pasta, brown rice, and whole grain bread. ? Eating or drinking low-fat dairy products, such as skim milk or low-fat yogurt. ? Filling one quarter of your plate at each meal with low-fat (lean) proteins. Low-fat proteins include fish, skinless chicken, eggs, beans, and tofu. ? Avoiding fatty meat, cured and processed meat, or chicken with skin. ? Avoiding premade or processed food.  Eat less than 1,500 mg of salt (sodium) a day.  Limit alcohol use to no more than 1 drink a day for nonpregnant women and 2 drinks a day for men. One drink equals 12 oz of beer, 5 oz of wine, or 1 oz of hard liquor. Lifestyle  Work with your doctor to stay at a healthy weight or to lose weight. Ask your doctor what the best weight is for you.  Get at least 30 minutes of exercise that causes  your heart to beat faster (aerobic exercise) most days of the week. This may include walking, swimming, or biking.  Get at least 30 minutes of exercise that strengthens your muscles (resistance exercise) at least 3 days a week. This may include lifting weights or pilates.  Do not use any products that contain nicotine or tobacco. This includes cigarettes and e-cigarettes. If you need help quitting, ask your doctor.  Check your blood pressure at home as told by your doctor.  Keep all follow-up visits as told by your doctor. This is important. Medicines  Take over-the-counter and prescription medicines only as told by your doctor. Follow directions carefully.  Do not skip doses of blood pressure medicine. The medicine does not work as well if you skip doses. Skipping doses also puts you at risk for problems.  Ask your doctor about side effects or reactions to medicines that you should watch for. Contact a doctor if:  You think you are having a reaction to the medicine you are taking.  You have headaches that keep coming back (recurring).  You feel dizzy.  You have swelling in your ankles.  You have trouble with your vision. Get help right away if:  You get a very bad headache.  You start to feel confused.  You feel weak or numb.  You feel faint.  You get very bad pain in your: ? Chest. ? Belly (abdomen).  You throw up (  vomit) more than once.  You have trouble breathing. Summary  Hypertension is another name for high blood pressure.  Making healthy choices can help lower blood pressure. If your blood pressure cannot be controlled with healthy choices, you may need to take medicine. This information is not intended to replace advice given to you by your health care provider. Make sure you discuss any questions you have with your health care provider. Document Released: 04/05/2008 Document Revised: 09/15/2016 Document Reviewed: 09/15/2016 Elsevier Interactive Patient  Education  [employer].

## 2018-05-25 NOTE — Assessment & Plan Note (Signed)
BP appears stable on current dosage of amlodipine, continue Continue to monitor BP readings at home We also discussed  role of healthy diet and exercise in the management of HTN and provided additional information on AVS RTC in 3 months for F/U-recheck BP, or sooner for BP readings >140/90

## 2018-06-01 DIAGNOSIS — F259 Schizoaffective disorder, unspecified: Secondary | ICD-10-CM | POA: Diagnosis not present

## 2018-06-26 ENCOUNTER — Other Ambulatory Visit: Payer: Self-pay | Admitting: Nurse Practitioner

## 2018-06-26 DIAGNOSIS — I10 Essential (primary) hypertension: Secondary | ICD-10-CM

## 2018-07-13 DIAGNOSIS — Z23 Encounter for immunization: Secondary | ICD-10-CM | POA: Diagnosis not present

## 2018-09-08 ENCOUNTER — Other Ambulatory Visit: Payer: Self-pay | Admitting: Nurse Practitioner

## 2018-09-08 DIAGNOSIS — I10 Essential (primary) hypertension: Secondary | ICD-10-CM

## 2018-10-19 DIAGNOSIS — F259 Schizoaffective disorder, unspecified: Secondary | ICD-10-CM | POA: Diagnosis not present

## 2018-11-28 DIAGNOSIS — F259 Schizoaffective disorder, unspecified: Secondary | ICD-10-CM | POA: Diagnosis not present

## 2019-02-05 DIAGNOSIS — F259 Schizoaffective disorder, unspecified: Secondary | ICD-10-CM | POA: Diagnosis not present

## 2019-03-07 ENCOUNTER — Other Ambulatory Visit: Payer: Self-pay | Admitting: *Deleted

## 2019-03-07 ENCOUNTER — Other Ambulatory Visit: Payer: Self-pay | Admitting: Internal Medicine

## 2019-03-07 DIAGNOSIS — I10 Essential (primary) hypertension: Secondary | ICD-10-CM

## 2019-03-07 MED ORDER — AMLODIPINE BESYLATE 10 MG PO TABS: ORAL_TABLET | ORAL | 0 refills | Status: DC

## 2019-05-31 ENCOUNTER — Other Ambulatory Visit: Payer: Self-pay

## 2019-07-18 DIAGNOSIS — Z23 Encounter for immunization: Secondary | ICD-10-CM | POA: Diagnosis not present

## 2019-08-31 DIAGNOSIS — F259 Schizoaffective disorder, unspecified: Secondary | ICD-10-CM | POA: Diagnosis not present

## 2019-10-02 DIAGNOSIS — F259 Schizoaffective disorder, unspecified: Secondary | ICD-10-CM | POA: Diagnosis not present

## 2019-11-29 DIAGNOSIS — F259 Schizoaffective disorder, unspecified: Secondary | ICD-10-CM | POA: Diagnosis not present

## 2020-03-15 ENCOUNTER — Other Ambulatory Visit: Payer: Self-pay | Admitting: Internal Medicine

## 2020-03-15 DIAGNOSIS — I10 Essential (primary) hypertension: Secondary | ICD-10-CM

## 2020-07-29 ENCOUNTER — Other Ambulatory Visit: Payer: Self-pay

## 2020-07-29 ENCOUNTER — Ambulatory Visit (INDEPENDENT_AMBULATORY_CARE_PROVIDER_SITE_OTHER): Payer: Medicare Other

## 2020-07-29 ENCOUNTER — Encounter: Payer: Self-pay | Admitting: Family Medicine

## 2020-07-29 ENCOUNTER — Ambulatory Visit (INDEPENDENT_AMBULATORY_CARE_PROVIDER_SITE_OTHER): Payer: Medicare Other | Admitting: Family Medicine

## 2020-07-29 ENCOUNTER — Ambulatory Visit: Payer: Self-pay

## 2020-07-29 VITALS — BP 110/64 | HR 80 | Ht 65.0 in | Wt 107.0 lb

## 2020-07-29 DIAGNOSIS — S82142A Displaced bicondylar fracture of left tibia, initial encounter for closed fracture: Secondary | ICD-10-CM | POA: Diagnosis not present

## 2020-07-29 DIAGNOSIS — M25562 Pain in left knee: Secondary | ICD-10-CM

## 2020-07-29 NOTE — Patient Instructions (Addendum)
Thank you for coming in today.  Please get an Xray today before you leave  Please use voltaren gel up to 4x daily for pain as needed.   If not better recheck with me soon.  Can do injection.

## 2020-07-29 NOTE — Progress Notes (Signed)
    Subjective:    CC: L leg pain  I, Molly Weber, LAT, ATC, am serving as scribe for Dr. Lynne Leader.  HPI: Pt is a 79 y/o female presenting w/ c/o L leg pain since Sunday when she twisted her L leg and knee trying to prevent herself from falling.  Pain located predominantly lateral knee.  Patient only has pain with activity and notes that her symptoms are improving.  No locking or catching.  Low back pain: No Radiating pain: yes into the L thigh and lower leg Aggravating factors: walking/weight bearing; L knee active flexion Treatments tried: Nothing  Pertinent review of Systems: No fevers or chills  Relevant historical information: Petite smoker.  Osteopenia on DEXA scan 2015.   Objective:    Vitals:   07/29/20 1309  BP: 110/64  Pulse: 80  SpO2: 98%   General: Well Developed, well nourished, and in no acute distress.   MSK: Left knee normal-appearing without effusion. Normal motion with crepitation. Tender to palpation lateral joint line mildly. Stable ligamentous exam. Positive lateral McMurray's test mildly. Intact strength.  Lab and Radiology Results  X-ray images left knee obtained today personally and independently interpreted Sclerosis at lateral tibial condyle with decreased bone mineral density without clear obvious fracture.  Mild DJD present. Await formal radiology review  Diagnostic Limited MSK Ultrasound of: Left knee Quad tendon intact normal-appearing Trace joint effusion superior patellar space. Patellar tendon normal. Medial joint line largely normal-appearing  Lateral joint line narrowed without clear definitive lateral meniscus tear. Impression: DJD    Impression and Recommendations:    Assessment and Plan: 79 y.o. female with left knee pain most likely to meniscus injury or exacerbation.  She may have a tiny tibial plateau fracture but it is hard to tell and I am awaiting formal radiology over read.  If uncertain could proceed with CT  scan or MRI in the near future.  Regardless her symptoms are pretty mild and she is improving spontaneously.  Plan for Voltaren gel and watchful waiting.  If needed return to clinic or proceed with injection.Marland Kitchen  PDMP not reviewed this encounter. Orders Placed This Encounter  Procedures  . [place] LIMITED JOINT SPACE STRUCTURES LOW LEFT(NO LINKED CHARGES)    Order Specific Question:   Reason for Exam (SYMPTOM  OR DIAGNOSIS REQUIRED)    Answer:   L knee pain    Order Specific Question:   Preferred imaging location?    Answer:   [facility]  . DG Knee AP/LAT W/Sunrise Left    Standing Status:   Future    Standing Expiration Date:   07/29/2021    Order Specific Question:   Reason for Exam (SYMPTOM  OR DIAGNOSIS REQUIRED)    Answer:   eval left knee pain    Order Specific Question:   Preferred imaging location?    Answer:   [facility]    Order Specific Question:   Radiology Contrast Protocol - do NOT remove file path    Answer:   [email or web address]   No orders of the defined types were placed in this encounter.   Discussed warning signs or symptoms. Please see discharge instructions. Patient expresses understanding.   The above documentation has been reviewed and is accurate and complete Lynne Leader, M.D.

## 2020-07-30 ENCOUNTER — Telehealth: Payer: Self-pay | Admitting: Family Medicine

## 2020-07-30 ENCOUNTER — Telehealth: Payer: Self-pay | Admitting: General Practice

## 2020-07-30 DIAGNOSIS — M25562 Pain in left knee: Secondary | ICD-10-CM

## 2020-07-30 DIAGNOSIS — S82142A Displaced bicondylar fracture of left tibia, initial encounter for closed fracture: Secondary | ICD-10-CM

## 2020-07-30 NOTE — Telephone Encounter (Signed)
Patient was wondering if she could be  TOC patient to Jodi Mourning, FNP from [facility] Chestnut, NP. Please advice

## 2020-07-30 NOTE — Telephone Encounter (Signed)
Valarie Merino from [facility] called with stat report for patient's knee xray, confirmed that these results are in Epic for review.

## 2020-07-31 NOTE — Telephone Encounter (Signed)
CT scan knee ordered to further evaluate possible tibial plateau fracture.

## 2020-07-31 NOTE — Progress Notes (Signed)
X-ray of knee is concerning for a tibial plateau fracture.  This is where a small portion of the bone crutches down.  It is hard to tell for sure so I have ordered a CT scan of the knee which would give [place] much more information about the bone.  You should hear soon about scheduling.  I recommend using a hinged knee brace and a walker.  You can get both a walker and a hinged knee brace from a medical supply company.  I can send a prescription if needed.  Repeat schedule with me after CT scan to go over the results.

## 2020-08-01 NOTE — Telephone Encounter (Signed)
Unable to accept at this time; can check back in a few months after Dr. Sharlet Salina is back; at this time, would recommend that she call other [place] offices.

## 2020-08-01 NOTE — Telephone Encounter (Signed)
Error- lw 

## 2020-08-04 DIAGNOSIS — Z23 Encounter for immunization: Secondary | ICD-10-CM | POA: Diagnosis not present

## 2020-08-07 ENCOUNTER — Other Ambulatory Visit: Payer: Medicare Other

## 2020-08-08 ENCOUNTER — Other Ambulatory Visit: Payer: Self-pay

## 2020-08-08 ENCOUNTER — Ambulatory Visit (INDEPENDENT_AMBULATORY_CARE_PROVIDER_SITE_OTHER): Payer: Medicare Other

## 2020-08-08 DIAGNOSIS — M25562 Pain in left knee: Secondary | ICD-10-CM

## 2020-08-08 DIAGNOSIS — M85862 Other specified disorders of bone density and structure, left lower leg: Secondary | ICD-10-CM

## 2020-08-08 DIAGNOSIS — S82142A Displaced bicondylar fracture of left tibia, initial encounter for closed fracture: Secondary | ICD-10-CM

## 2020-08-08 DIAGNOSIS — S8992XA Unspecified injury of left lower leg, initial encounter: Secondary | ICD-10-CM | POA: Diagnosis not present

## 2020-08-08 DIAGNOSIS — M25462 Effusion, left knee: Secondary | ICD-10-CM | POA: Diagnosis not present

## 2020-08-11 NOTE — Progress Notes (Signed)
CT scan knee shows minimal fracture that looks like it is healing.  Recommend schedule follow-up appoint with me in the near future to go over the results and discuss what our next steps are.

## 2020-08-14 ENCOUNTER — Other Ambulatory Visit: Payer: Self-pay

## 2020-08-14 ENCOUNTER — Encounter: Payer: Self-pay | Admitting: Family Medicine

## 2020-08-14 ENCOUNTER — Ambulatory Visit (INDEPENDENT_AMBULATORY_CARE_PROVIDER_SITE_OTHER): Payer: Medicare Other | Admitting: Family Medicine

## 2020-08-14 VITALS — BP 104/70 | HR 81 | Ht 65.0 in | Wt 104.4 lb

## 2020-08-14 DIAGNOSIS — Z780 Asymptomatic menopausal state: Secondary | ICD-10-CM

## 2020-08-14 DIAGNOSIS — M8080XA Other osteoporosis with current pathological fracture, unspecified site, initial encounter for fracture: Secondary | ICD-10-CM | POA: Diagnosis not present

## 2020-08-14 DIAGNOSIS — S82142A Displaced bicondylar fracture of left tibia, initial encounter for closed fracture: Secondary | ICD-10-CM | POA: Insufficient documentation

## 2020-08-14 LAB — COMPREHENSIVE METABOLIC PANEL
ALT: 9 U/L (ref 0–35)
AST: 12 U/L (ref 0–37)
Albumin: 4 g/dL (ref 3.5–5.2)
Alkaline Phosphatase: 75 U/L (ref 39–117)
BUN: 10 mg/dL (ref 6–23)
CO2: 28 mEq/L (ref 19–32)
Calcium: 9.2 mg/dL (ref 8.4–10.5)
Chloride: 101 mEq/L (ref 96–112)
Creatinine, Ser: 0.57 mg/dL (ref 0.40–1.20)
GFR: 89.2 mL/min (ref >60.00)
Glucose, Bld: 104 mg/dL — ABNORMAL HIGH (ref 70–99)
Potassium: 3 mEq/L — ABNORMAL LOW (ref 3.5–5.1)
Sodium: 138 mEq/L (ref 135–145)
Total Bilirubin: 0.6 mg/dL (ref 0.2–1.2)
Total Protein: 6.9 g/dL (ref 6.0–8.3)

## 2020-08-14 NOTE — Progress Notes (Signed)
I, Wendy Poet, LAT, ATC, am serving as scribe for Dr. Lynne Leader.  Brenda Gibson is a 79 y.o. female who presents to [place] at [facility] today for f/u of L knee pain and L knee CT review.  She was last seen by Dr. Georgina Snell on 07/29/20 after twisting her L knee while trying to prevent herself from falling.  Since her last visit, pt reports that her L knee is feeling better and denies having any pain or swelling in her L knee at this point.  She does not currently have a primary care provider.  Not currently taking any medication for bone density.  Previous bone density test 2015 showed osteopenia pia with T score -1.9  Diagnostic testing: L knee CT - 08/08/20; L knee XR- 07/29/20   Pertinent review of systems: No fevers or chills  Relevant historical information: Deconditioning smoker underweight   Exam:  BP 104/70 (BP Location: Right Arm, Patient Position: Sitting, Cuff Size: Normal)   Pulse 81   Ht 5\' 5"  (1.651 m)   Wt 104 lb 6.4 oz (47.4 kg)   SpO2 96%   BMI 17.37 kg/m  General: Well Developed, well nourished, and in no acute distress.   MSK: Left knee nontender normal motion.    Lab and Radiology Results CT KNEE LEFT WO CONTRAST  Result Date: 08/08/2020 CLINICAL DATA:  Left knee pain since a twisting injury 07/27/2020. Possible fracture on prior plain films. EXAM: CT OF THE LEFT KNEE WITHOUT CONTRAST TECHNIQUE: Multidetector CT imaging of the left knee was performed according to the standard protocol. Multiplanar CT image reconstructions were also generated. COMPARISON:  Plain films left knee 07/29/2020. FINDINGS: Bones/Joint/Cartilage Bones appear osteopenic. There is minimal peripheral sloping of the lateral tibial plateau. No displaced fracture or cortical step-off is identified. No lytic or sclerotic lesion is seen. Joint spaces are preserved. Small joint effusion is noted. Ligaments Suboptimally assessed by CT. The cruciate and collateral ligaments  appear intact. Muscles and Tendons Intact. Soft tissues No fluid collection or mass.  No Baker's cyst. IMPRESSION: Minimal peripheral sloping of the lateral tibial plateau suggestive of a minimal impaction fracture. No displaced fracture or cortical step-off is identified. Osteopenia. Electronically Signed   By: Inge Rise M.D.   On: 08/08/2020 15:04   [place] LIMITED JOINT SPACE STRUCTURES LOW LEFT(NO LINKED CHARGES)  Result Date: 07/31/2020 Diagnostic Limited MSK Ultrasound of: Left knee Quad tendon intact normal-appearing Trace joint effusion superior patellar space. Patellar tendon normal. Medial joint line largely normal-appearing Lateral joint line narrowed without clear definitive lateral meniscus tear. Impression: DJD  DG Knee AP/LAT W/Sunrise Left  Result Date: 07/30/2020 CLINICAL DATA:  Acute knee pain. EXAM: LEFT KNEE 3 VIEWS COMPARISON:  None. FINDINGS: The lateral tibial plateau shows loss of cortical definition consistent with acute fracture with slight displacement. There is a joint effusion. Medial joint space normal. Patella normal. Early arterial calcification popliteal artery. IMPRESSION: Slightly displaced fracture lateral tibial plateau with joint effusion. These results will be called to the ordering clinician or representative by the Radiologist Assistant, and communication documented in the PACS or [employer]. Electronically Signed   By: Franchot Gallo M.D.   On: 07/30/2020 16:41   I, Lynne Leader, personally (independently) visualized and performed the interpretation of the x-ray and CT scan images attached in this note. Small depressed tibial plateau fracture laterally     Assessment and Plan: 79 y.o. female with left tibial plateau fracture very likely due to osteoporosis  as not significantly impacting causing the fracture.  Fortunately she is effectively pain-free at this point.  I think asking her to restrict weightbearing and use a walker will more likely cause a  fall and cause more harm than allowing her to continue to walk on her leg as it is now.  She seems to be doing quite well.   However this fracture is likely due to osteoporosis. Currently I do believe that she meets criteria for osteoporosis with osteopenia T score from 2015 with minimal energy fracture. Will update bone density test and get the process started for Prolia. Will check metabolic panel now so that we have a sense of calcium prior to starting Prolia.  She currently does not effectively have a primary care provider as she receives the majority of her care from a psychiatrist. Advised her to reschedule with PCP.   PDMP not reviewed this encounter. Orders Placed This Encounter  Procedures  . DG Bone Density    Standing Status:   Future    Standing Expiration Date:   08/14/2021    Order Specific Question:   Reason for Exam (SYMPTOM  OR DIAGNOSIS REQUIRED)    Answer:   eval bone density    Order Specific Question:   Preferred imaging location?    Answer:   [facility]  . COMPLETE METABOLIC PANEL WITH GFR    Standing Status:   Future    Standing Expiration Date:   08/14/2021   No orders of the defined types were placed in this encounter.    Discussed warning signs or symptoms. Please see discharge instructions. Patient expresses understanding.   The above documentation has been reviewed and is accurate and complete Lynne Leader, M.D.

## 2020-08-14 NOTE — Patient Instructions (Signed)
Thank you for coming in today.  Please get labs today before you leave  Schedule the bone density test as you leave.   Return once we get prolia approved.   Schedule with a new primary care proveder upstairs to replace [employer]

## 2020-08-15 NOTE — Progress Notes (Signed)
Calcium level is normal for Prolia.  Potassium is a bit low.  Consider taking some potassium supplementation

## 2020-09-12 NOTE — Progress Notes (Signed)
   Rito Ehrlich, am serving as a Education administrator for Dr. Lynne Leader.  Brenda Gibson is a 79 y.o. female who presents to [place] at [facility] today for back pn after a fall in the bathtub. Pt was previously seen by Dr. Georgina Snell on 08/14/20 for a L tibial plateau fracture. Today, pt reports she fell in her bathroom while getting out of the tub X5 days ago. Pt locates pn to Right lower back. Patient states that as she was getting out of the tub she slipped and fell backward and hit R low back on the edge of the tub. Did not hit head, thought the back pain would subside but it hasn't. No radiating pain down the legs. Describes pain as a constant sore pain that is worse if laying on that side. States son gave her a vicodin he had left over which did not help, taking tylenol extra strength.     Dx imaging: 08/14/20 Bone density scan  Pertinent review of systems: No fevers or chills  Relevant historical information: Smoker with COPD.  Malnutrition.  Schizoaffective disorder.  History of small bowel obstruction.   Exam:  BP 108/78 (BP Location: Left Arm, Patient Position: Sitting, Cuff Size: Normal)   Pulse 83   Ht 5\' 5"  (1.651 m)   Wt 106 lb (48.1 kg)   SpO2 95%   BMI 17.64 kg/m  General: Well Developed, well nourished, and in no acute distress.   MSK: T-spine L-spine nontender to midline. Right inferior posterior to lateral rib margin tender to palpation with no crepitation. Chest expand symmetrically with deep breath.  Pain with deep inspiration present. Some pain with thoracic motion and lumbar spine motion present into the right inferior lateral rib.    Lab and Radiology Results X-ray images right ribs obtained today personally and independently interpreted. No acute fractures visible however multiple calcified cartilaginous false rib structures present. Await formal radiology review    Assessment and Plan: 79 y.o. female with fall with right inferior posterior  rib pain.  I did not appreciate fracture on x-ray today however radiology review is still pending.  Plan for rib binder, limited hydrocodone for pain control, and deep inspiration every 30 minutes to prevent atelectasis.  Recheck back in 2 weeks.  Given history of bowel obstruction recommend laxatives with hydrocodone to avoid significant constipation.   PDMP reviewed during this encounter. Orders Placed This Encounter  Procedures  . DG Ribs Unilateral W/Chest Right    Standing Status:   Future    Number of Occurrences:   1    Standing Expiration Date:   09/15/2021    Order Specific Question:   Reason for Exam (SYMPTOM  OR DIAGNOSIS REQUIRED)    Answer:   eval rib pain after fall    Order Specific Question:   Preferred imaging location?    Answer:   [facility]   Meds ordered this encounter  Medications  . HYDROcodone-acetaminophen (NORCO/VICODIN) 5-325 MG tablet    Sig: Take 1 tablet by mouth every 6 (six) hours as needed.    Dispense:  15 tablet    Refill:  0     Discussed warning signs or symptoms. Please see discharge instructions. Patient expresses understanding.   The above documentation has been reviewed and is accurate and complete Lynne Leader, M.D.

## 2020-09-15 ENCOUNTER — Ambulatory Visit (INDEPENDENT_AMBULATORY_CARE_PROVIDER_SITE_OTHER): Payer: Medicare Other

## 2020-09-15 ENCOUNTER — Other Ambulatory Visit: Payer: Self-pay

## 2020-09-15 ENCOUNTER — Encounter: Payer: Self-pay | Admitting: Family Medicine

## 2020-09-15 ENCOUNTER — Ambulatory Visit (INDEPENDENT_AMBULATORY_CARE_PROVIDER_SITE_OTHER): Payer: Medicare Other | Admitting: Family Medicine

## 2020-09-15 VITALS — BP 108/78 | HR 83 | Ht 65.0 in | Wt 106.0 lb

## 2020-09-15 DIAGNOSIS — R0781 Pleurodynia: Secondary | ICD-10-CM | POA: Diagnosis not present

## 2020-09-15 DIAGNOSIS — S2231XA Fracture of one rib, right side, initial encounter for closed fracture: Secondary | ICD-10-CM | POA: Diagnosis not present

## 2020-09-15 MED ORDER — HYDROCODONE-ACETAMINOPHEN 5-325 MG PO TABS: 1.0000 | ORAL_TABLET | Freq: Four times a day (QID) | ORAL | 0 refills | Status: DC | PRN

## 2020-09-15 NOTE — Patient Instructions (Signed)
Thank you for coming in today.  Please get an Xray today before you leave  Take deep breaths every 30 mins while awake.   Use a rib belt or rib binder. You should be able to get it at the pharmacy or a medical supply company like [employer].   Use hydrocodone sparingly. Make sure you take laxatives with it to avoid constipation.   Recheck in 2 weeks.   Let me know sooner if you have a problem.    Rib Contusion A rib contusion is a deep bruise on your rib area. Contusions are the result of a blunt trauma that causes bleeding and injury to the tissues under the skin. A rib contusion may involve bruising of the ribs and of the skin and muscles in the area. The skin over the contusion may turn blue, purple, or yellow. Minor injuries will give you a painless contusion. More severe contusions may stay painful and swollen for a few weeks. What are the causes? This condition is usually caused by a blow, trauma, or direct force to an area of the body. This often occurs while playing contact sports. What are the signs or symptoms? Symptoms of this condition include:  Swelling and redness of the injured area.  Discoloration of the injured area.  Tenderness and soreness of the injured area.  Pain with or without movement. How is this diagnosed? This condition may be diagnosed based on:  Your symptoms and medical history.  A physical exam.  Imaging tests--such as an X-ray, CT scan, or MRI--to determine if there were internal injuries or broken bones (fractures). How is this treated? This condition may be treated with:  Rest. This is often the best treatment for a rib contusion.  Icing. This reduces swelling and inflammation.  Deep-breathing exercises. These may be recommended to reduce the risk for lung collapse and pneumonia.  Medicines. Over-the-counter or prescription medicines may be given to control pain.  Injection of a numbing medicine around the nerve near your  injury (nerve block). Follow these instructions at home:     Medicines  Take over-the-counter and prescription medicines only as told by your health care provider.  Do not drive or use heavy machinery while taking prescription pain medicine.  If you are taking prescription pain medicine, take actions to prevent or treat constipation. Your health care provider may recommend that you: ? Drink enough fluid to keep your urine pale yellow. ? Eat foods that are high in fiber, such as fresh fruits and vegetables, whole grains, and beans. ? Limit foods that are high in fat and processed sugars, such as fried or sweet foods. ? Take an over-the-counter or prescription medicine for constipation. Managing pain, stiffness, and swelling  If directed, put ice on the injured area: ? Put ice in a plastic bag. ? Place a towel between your skin and the bag. ? Leave the ice on for 20 minutes, 2-3 times a day.  Rest the injured area. Avoid strenuous activity and any activities or movements that cause pain. Be careful during activities and avoid bumping the injured area.  Do not lift anything that is heavier than 5 lb (2.3 kg), or the limit that you are told, until your health care provider says that it is safe. General instructions  Do not use any products that contain nicotine or tobacco, such as cigarettes and e-cigarettes. These can delay healing. If you need help quitting, ask your health care provider.  Do deep-breathing exercises as told  by your health care provider.  If you were given an incentive spirometer, use it every 1-2 hours while you are awake, or as recommended by your health care provider. This device measures how well you are filling your lungs with each breath.  Keep all follow-up visits as told by your health care provider. This is important. Contact a health care provider if you have:  Increased bruising or swelling.  Pain that is not controlled with treatment.  A fever. Get  help right away if you:  Have difficulty breathing or shortness of breath.  Develop a continual cough or you cough up thick or bloody sputum.  Feel nauseous or you vomit.  Have pain in your abdomen. Summary  A rib contusion is a deep bruise on your rib area. Contusions are the result of a blunt trauma that causes bleeding and injury to the tissues under the skin.  The skin overlying the contusion may turn blue, purple, or yellow. Minor injuries may give you a painless contusion. More severe contusions may stay painful and swollen for a few weeks.  Rest the injured area. Avoid strenuous activity and any activities or movements that cause pain. This information is not intended to replace advice given to you by your health care provider. Make sure you discuss any questions you have with your health care provider. Document Revised: 11/16/2017 Document Reviewed: 11/16/2017 Elsevier Patient Education  2020 [employer].

## 2020-09-16 NOTE — Progress Notes (Signed)
X-ray ribs does show a fracture of the 10th rib.  Please keep scheduled follow-up appointment on November 29.

## 2020-09-18 ENCOUNTER — Telehealth: Payer: Self-pay | Admitting: Family Medicine

## 2020-09-18 NOTE — Telephone Encounter (Signed)
First-line and second line treatment for constipation due to opiate should include MiraLAX and docusate or Senokot. Additionally okay to try magnesium citrate  If not better there is a very expensive branded medicine for opiate-induced constipation but that is hard to get approved without trying these over-the-counter medications first.

## 2020-09-18 NOTE — Telephone Encounter (Signed)
Call was attempted, however no answer and no VM set up.

## 2020-09-18 NOTE — Telephone Encounter (Signed)
Patient called stating that the laxatives that were prescribed do not seem to be helping. She asked if something stronger could be sent in.  Please advise.

## 2020-09-19 ENCOUNTER — Telehealth: Payer: Self-pay | Admitting: Family Medicine

## 2020-09-19 NOTE — Telephone Encounter (Signed)
Patient called requesting a refill on her HYDROcodone-acetaminophen (NORCO/VICODIN) 5-325 MG tablet. She said that she has a few left but wanted to go ahead and request the refill.

## 2020-09-19 NOTE — Telephone Encounter (Signed)
Patient call back. Given MD response. She was not aware that she needed to also be taking MiraLAX so she is going to try that and if that does not work, she will add in Magnesium Citrate.

## 2020-09-22 MED ORDER — HYDROCODONE-ACETAMINOPHEN 5-325 MG PO TABS: 1.0000 | ORAL_TABLET | Freq: Four times a day (QID) | ORAL | 0 refills | Status: DC | PRN

## 2020-09-22 NOTE — Telephone Encounter (Signed)
Spoke w/ pt and relayed message. Pt understood information.

## 2020-09-22 NOTE — Telephone Encounter (Signed)
Refilled.  Use sparingly if able.

## 2020-09-24 NOTE — Progress Notes (Deleted)
    Brenda Gibson is a 79 y.o. female who presents to [place] at [facility] today for f/u of R lower t-spine/rib pain after suffering a fall on 09/10/20 and hit her back against the side of the bathtub.  She was last seen by Dr. Georgina Snell on 09/15/20 and was prescribed a limited quantity of hydrocodone-acetaminophen and advised to use a rib binder.  Since her last visit, pt reports   Diagnostic imaging: Chest/rib XR- 09/15/20  Pertinent review of systems: ***  Relevant historical information: ***   Exam:  There were no vitals taken for this visit. General: Well Developed, well nourished, and in no acute distress.   MSK: ***    Lab and Radiology Results No results found for this or any previous visit (from the past 72 hour(s)). No results found.     Assessment and Plan: 79 y.o. female with ***   PDMP not reviewed this encounter. No orders of the defined types were placed in this encounter.  No orders of the defined types were placed in this encounter.    Discussed warning signs or symptoms. Please see discharge instructions. Patient expresses understanding.   ***

## 2020-09-24 NOTE — Telephone Encounter (Signed)
Brenda Gibson, can you complete this encounter so I can close it.   Thank you

## 2020-09-29 ENCOUNTER — Ambulatory Visit: Payer: Medicare Other | Admitting: Family Medicine

## 2020-10-02 NOTE — Progress Notes (Deleted)
   I, Wendy Poet, LAT, ATC, am serving as scribe for Dr. Lynne Leader.  CAMIAH HUMM is a 79 y.o. female who presents to [place] at [facility] today for f/u of R inferior posterior rib pain after slipping and falling while getting out of the bathtub on 09/10/20.  She was last seen by Dr. Georgina Snell on 09/15/20 and was advised to use a rib binder for compression and was prescribed hydrocodone-acetaminophen.  Since her last visit, pt reports   Diagnostic testing: Chest/rib XR- 09/15/20   Pertinent review of systems: ***  Relevant historical information: ***   Exam:  There were no vitals taken for this visit. General: Well Developed, well nourished, and in no acute distress.   MSK: ***    Lab and Radiology Results No results found for this or any previous visit (from the past 72 hour(s)). No results found.     Assessment and Plan: 79 y.o. female with ***   PDMP not reviewed this encounter. No orders of the defined types were placed in this encounter.  No orders of the defined types were placed in this encounter.    Discussed warning signs or symptoms. Please see discharge instructions. Patient expresses understanding.   ***

## 2020-10-03 ENCOUNTER — Ambulatory Visit: Payer: Medicare Other | Admitting: Family Medicine

## 2020-10-06 ENCOUNTER — Ambulatory Visit (INDEPENDENT_AMBULATORY_CARE_PROVIDER_SITE_OTHER): Payer: Medicare Other

## 2020-10-06 ENCOUNTER — Other Ambulatory Visit: Payer: Self-pay

## 2020-10-06 ENCOUNTER — Ambulatory Visit (INDEPENDENT_AMBULATORY_CARE_PROVIDER_SITE_OTHER): Payer: Medicare Other | Admitting: Family Medicine

## 2020-10-06 VITALS — BP 118/78 | HR 80 | Ht 65.0 in | Wt 100.6 lb

## 2020-10-06 DIAGNOSIS — R0781 Pleurodynia: Secondary | ICD-10-CM

## 2020-10-06 DIAGNOSIS — S2231XD Fracture of one rib, right side, subsequent encounter for fracture with routine healing: Secondary | ICD-10-CM | POA: Diagnosis not present

## 2020-10-06 DIAGNOSIS — S2231XA Fracture of one rib, right side, initial encounter for closed fracture: Secondary | ICD-10-CM | POA: Diagnosis not present

## 2020-10-06 NOTE — Progress Notes (Signed)
   I, Peterson Lombard, LAT, ATC acting as a scribe for Brenda Leader, MD. Brenda Gibson is a 79 y.o. female who presents to [place] at [facility] today for f/u rib pain. Pt was last seen by Dr. Georgina Snell on 09/15/20 and was advised to wear rib binder, limit hydrocodone, and do deep inspirations every 30 mins. Today, pt reports rib is sore at time. Pt is non compliant w/ rib binder and deep inspirations. Pt reports she is continuing to take hydrocodone.  She is not having any new or worsening shortness of breath.  She does not have any constipation.  She takes hydrocodone very sparingly and infrequently.  Dx imaging: 09/15/20 Rib XR  Pertinent review of systems: No fevers or chills  Relevant historical information: History tobacco abuse, history of postoperative ileus, history of malnutrition   Exam:  BP 118/78 (BP Location: Right Arm, Patient Position: Sitting, Cuff Size: Normal)   Pulse 80   Ht 5\' 5"  (1.651 m)   Wt 100 lb 9.6 oz (45.6 kg)   SpO2 98%   BMI 16.74 kg/m  General: Well Developed, malnourished, and in no acute distress.   Chest: Lungs clear to auscultation bilaterally.  Ribs nontender    Lab and Radiology Results X-ray ribs and chest obtained today personally and independently interpreted Lungs hyperexpanded with blunted CV angles not much changed compared to baseline x-ray obtained November 15.  Fracture still evident without significant callus per my interpretation.  No significant infiltrate visible. Await formal radiology interpretation    Assessment and Plan: 79 y.o. female with rib fracture clinically improving.  On x-ray still in the process of healing. Brenda Gibson is frail and in poor health.  However she seems to be doing okay with this injury.  She did surprisingly well with a previous knee fracture earlier this year as well.  Plan to wean off hydrocodone and continue activity as tolerated.  Stressed the importance of deep inspiration to avoid  atelectasis.  Recheck back in a month.   PDMP not reviewed this encounter. Orders Placed This Encounter  Procedures  . DG Ribs Unilateral W/Chest Right    Standing Status:   Future    Number of Occurrences:   1    Standing Expiration Date:   10/06/2021    Order Specific Question:   Reason for Exam (SYMPTOM  OR DIAGNOSIS REQUIRED)    Answer:   rib pain    Order Specific Question:   Preferred imaging location?    Answer:   [facility]   No orders of the defined types were placed in this encounter.    Discussed warning signs or symptoms. Please see discharge instructions. Patient expresses understanding.   The above documentation has been reviewed and is accurate and complete Brenda Gibson, M.D.

## 2020-10-06 NOTE — Patient Instructions (Signed)
Thank you for coming in today.  Please get an Xray today before you leave  Recheck in 1 month unless feeling better.   Do those deep breathing exercises.

## 2020-10-06 NOTE — Progress Notes (Signed)
X-ray rib looks about the same.  No change in alignment or displacement.

## 2020-10-07 ENCOUNTER — Emergency Department (HOSPITAL_COMMUNITY)
Admission: EM | Admit: 2020-10-07 | Discharge: 2020-10-07 | Disposition: A | Discharge status: Home / Self Care | Payer: Medicare Other | Attending: Emergency Medicine | Admitting: Emergency Medicine

## 2020-10-07 ENCOUNTER — Emergency Department (HOSPITAL_COMMUNITY): Payer: Medicare Other

## 2020-10-07 ENCOUNTER — Encounter (HOSPITAL_COMMUNITY): Payer: Self-pay

## 2020-10-07 DIAGNOSIS — F17210 Nicotine dependence, cigarettes, uncomplicated: Secondary | ICD-10-CM | POA: Insufficient documentation

## 2020-10-07 DIAGNOSIS — Z79899 Other long term (current) drug therapy: Secondary | ICD-10-CM | POA: Diagnosis not present

## 2020-10-07 DIAGNOSIS — J441 Chronic obstructive pulmonary disease with (acute) exacerbation: Secondary | ICD-10-CM | POA: Insufficient documentation

## 2020-10-07 DIAGNOSIS — Z85828 Personal history of other malignant neoplasm of skin: Secondary | ICD-10-CM | POA: Insufficient documentation

## 2020-10-07 DIAGNOSIS — J449 Chronic obstructive pulmonary disease, unspecified: Secondary | ICD-10-CM | POA: Diagnosis not present

## 2020-10-07 DIAGNOSIS — R0602 Shortness of breath: Secondary | ICD-10-CM | POA: Diagnosis not present

## 2020-10-07 DIAGNOSIS — I10 Essential (primary) hypertension: Secondary | ICD-10-CM | POA: Diagnosis not present

## 2020-10-07 HISTORY — DX: Essential (primary) hypertension: I10

## 2020-10-07 LAB — CBC
HCT: 42.2 % (ref 36.0–46.0)
Hemoglobin: 13.3 g/dL (ref 12.0–15.0)
MCH: 28.2 pg (ref 26.0–34.0)
MCHC: 31.5 g/dL (ref 30.0–36.0)
MCV: 89.4 fL (ref 80.0–100.0)
Platelets: 299 10*3/uL (ref 150–400)
RBC: 4.72 MIL/uL (ref 3.87–5.11)
RDW: 14.6 % (ref 11.5–15.5)
WBC: 8.2 10*3/uL (ref 4.0–10.5)
nRBC: 0 % (ref 0.0–0.2)

## 2020-10-07 LAB — BASIC METABOLIC PANEL
Anion gap: 10 (ref 5–15)
BUN: 15 mg/dL (ref 8–23)
CO2: 28 mmol/L (ref 22–32)
Calcium: 9.2 mg/dL (ref 8.9–10.3)
Chloride: 103 mmol/L (ref 98–111)
Creatinine, Ser: 0.54 mg/dL (ref 0.44–1.00)
GFR, Estimated: >60 mL/min (ref >60)
Glucose, Bld: 119 mg/dL — ABNORMAL HIGH (ref 70–99)
Potassium: 3.4 mmol/L — ABNORMAL LOW (ref 3.5–5.1)
Sodium: 141 mmol/L (ref 135–145)

## 2020-10-07 MED ORDER — ALBUTEROL SULFATE HFA 108 (90 BASE) MCG/ACT IN AERS: 2.0000 | INHALATION_SPRAY | RESPIRATORY_TRACT | 0 refills | Status: DC | PRN

## 2020-10-07 MED ORDER — DEXAMETHASONE SODIUM PHOSPHATE 10 MG/ML IJ SOLN
10.0000 mg | Freq: Once | INTRAMUSCULAR | Status: AC
  Administered 2020-10-07: 10 mg via INTRAVENOUS
  Filled 2020-10-07: qty 1

## 2020-10-07 MED ORDER — DOXYCYCLINE HYCLATE 100 MG PO CAPS: 100.0000 mg | ORAL_CAPSULE | Freq: Two times a day (BID) | ORAL | 0 refills | Status: DC

## 2020-10-07 MED ORDER — ALBUTEROL SULFATE HFA 108 (90 BASE) MCG/ACT IN AERS
6.0000 | INHALATION_SPRAY | Freq: Once | RESPIRATORY_TRACT | Status: AC
  Administered 2020-10-07: 6 via RESPIRATORY_TRACT
  Filled 2020-10-07: qty 6.7

## 2020-10-07 NOTE — ED Provider Notes (Signed)
[place] DEPT Provider Note   CSN: [id number] Arrival date & time: 10/07/20  6606     History Chief Complaint  Patient presents with  . Shortness of Breath    Brenda Gibson is a 79 y.o. female.  HPI     This is a 79 year old female with a history of hypertension and smoking who presents with shortness of breath.  Patient reports several weeks ago she was diagnosed with a rib fracture.  Since that time she has had intermittent shortness of breath worsened tonight.  Denies any history of COPD but has had to use inhalers in the past.  Has not had any chest pain, fevers, or cough.  No known sick contacts or Covid exposures.  Shortness of breath is worse with exertion.  Reports that rib fracture pain is well controlled.  She has not been using her incentive spirometer.   Past Medical History:  Diagnosis Date  . Basal cell carcinoma of ala nasi   . Cataract   . Hypertension   . Knee pain, bilateral   . Measles   . Myalgia   . Seborrheic dermatitis of scalp   . Varicella     Patient Active Problem List   Diagnosis Date Noted  . Closed fracture of left tibial plateau 08/14/2020  . Physical deconditioning 07/30/2016  . Small bowel obstruction ([place])   . Hypokalemia 07/04/2016  . Hypomagnesemia 07/04/2016  . Urinary retention 07/04/2016  . Ileus, postoperative ([place]) 07/02/2016  . Malnutrition of moderate degree 06/28/2016  . SBO (small bowel obstruction) s/p lysis of adhesion 06/25/2016 06/21/2016  . Enteritis 06/21/2016  . 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 Elevated blood pressure reading without diagnosis of hypertension 06/19/2016  . Medicare annual wellness visit, subsequent 02/23/2016  . Female bladder prolapse 09/03/2014  . Routine general medical examination at a health care facility 08/12/2014  . Osteoporosis 08/12/2014  . Overactive bladder 08/02/2013  . Essential hypertension, benign 06/05/2013  . 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 Ganglion cyst of wrist 02/22/2013  . Tobacco abuse 02/24/2011    Past Surgical History:  Procedure Laterality Date  . BASAL CELL CARCINOMA EXCISION     '11 Allyson Sabal)  . CATARACT EXTRACTION W/ INTRAOCULAR LENS IMPLANT     right eye. '09 (Dr. Dolores Lory)  . LAPAROTOMY N/A 06/25/2016   Procedure: EXPLORATORY LAPAROTOMY, lysis of adhesion;  Surgeon: Coralie Keens, MD;  Location: [facility] ORS;  Service: General;  Laterality: N/A;  . LYSIS OF ADHESION  06/25/2016   Dr Nedra Hai     OB History   No obstetric history on file.     Family History  Problem Relation Age of Onset  . Alcohol abuse Mother   . Cancer Father 57       stomach cancer  . Prostate cancer Father   . Hypertension Maternal Grandfather   . Diabetes Neg Hx   . Heart disease Neg Hx     Social History   Tobacco Use  . Smoking status: Current Every Day Smoker    Packs/day: 0.60    Years: 52.00    Pack years: 31.20    Types: Cigarettes  . Smokeless tobacco: Never Used  . Tobacco comment: Pt smokes 5 cigarettes per day  Vaping Use  . Vaping Use: Never used  Substance Use Topics  . Alcohol use: Yes    Comment: rare use  . Drug use: No    Home Medications Prior to Admission medications   Medication Sig  Start Date End Date Taking? Authorizing Provider  albuterol (VENTOLIN HFA) 108 (90 Base) MCG/ACT inhaler Inhale 2 puffs into the lungs every 4 (four) hours as needed for wheezing or shortness of breath. 10/07/20   Margit Batte, Barbette Hair, MD  amLODipine (NORVASC) 10 MG tablet TAKE 1 TABLET(10 MG) BY MOUTH DAILY 03/16/20   Plotnikov, Evie Lacks, MD  doxycycline (VIBRAMYCIN) 100 MG capsule Take 1 capsule (100 mg total) by mouth 2 (two) times daily. 10/07/20   Tashiana Lamarca, Barbette Hair, MD  HYDROcodone-acetaminophen (NORCO/VICODIN) 5-325 MG tablet Take 1 tablet by mouth every 6 (six) hours as needed. 09/22/20   Gregor Hams, MD  triamcinolone cream (KENALOG) 0.1 % Apply 1 application topically 2 (two) times daily. 05/19/18    Lance Sell, NP  trifluoperazine (STELAZINE) 5 MG tablet Take 0.5 tablets by mouth every evening. Take 1/2 tablet to = 2.5 mg qd 06/04/16   [provider]    Allergies    Patient has no known allergies.  Review of Systems   Review of Systems  Constitutional: Negative for fever.  Respiratory: Positive for shortness of breath. Negative for cough.   Cardiovascular: Negative for chest pain and leg swelling.  Gastrointestinal: Negative for abdominal pain, nausea and vomiting.  All other systems reviewed and are negative.   Physical Exam Updated Vital Signs BP 138/78 (BP Location: Left Arm)   Pulse 78   Temp 97.7 F (36.5 C) (Oral)   Resp 18   SpO2 97%   Physical Exam Vitals and nursing note reviewed.  Constitutional:      General: She is not in acute distress.    Appearance: She is well-developed.     Comments: Chronically ill-appearing but nontoxic in no acute distress.  HENT:     Head: Normocephalic and atraumatic.     Mouth/Throat:     Mouth: Mucous membranes are moist.  Eyes:     Pupils: Pupils are equal, round, and reactive to light.  Cardiovascular:     Rate and Rhythm: Normal rate and regular rhythm.     Heart sounds: Normal heart sounds.  Pulmonary:     Effort: Pulmonary effort is normal. No respiratory distress.     Breath sounds: Decreased breath sounds and wheezing present.     Comments: No respiratory distress, breath diminished breath sounds in all lung fields with occasional wheeze Abdominal:     General: Bowel sounds are normal.     Palpations: Abdomen is soft.  Musculoskeletal:     Cervical back: Neck supple.     Right lower leg: No tenderness. No edema.     Left lower leg: No tenderness. No edema.  Skin:    General: Skin is warm and dry.  Neurological:     Mental Status: She is alert and oriented to person, place, and time.  Psychiatric:        Mood and Affect: Mood normal.     ED Results / Procedures / Treatments   Labs (all  labs ordered are listed, but only abnormal results are displayed) Labs Reviewed  BASIC METABOLIC PANEL - Abnormal; Notable for the following components:      Result Value   Potassium 3.4 (*)    Glucose, Bld 119 (*)    All other components within normal limits  CBC    EKG EKG Interpretation  Date/Time:  Tuesday October 07 2020 03:00:33 EST Ventricular Rate:  77 PR Interval:    QRS Duration: 91 QT Interval:  419 QTC Calculation: 475  R Axis:   102 Text Interpretation: Sinus rhythm Biatrial enlargement Probable right ventricular hypertrophy 12 Lead; [place] Confirmed by Thayer Jew [phone]) on 10/07/2020 4:23:31 AM   Radiology DG Chest 2 View  Result Date: 10/07/2020 CLINICAL DATA:  Shortness of breath EXAM: CHEST - 2 VIEW COMPARISON:  October 06, 2020 FINDINGS: The heart size and mediastinal contours are within normal limits. Again noted is hyperinflation of the lung zones with flattening of the hemidiaphragms. No focal airspace consolidation or pneumothorax. The nondisplaced posterior right tenth rib fracture is again seen. IMPRESSION: No active cardiopulmonary disease.  Findings of COPD. Electronically Signed   By: Prudencio Pair M.D.   On: 10/07/2020 03:28   DG Ribs Unilateral W/Chest Right  Result Date: 10/06/2020 CLINICAL DATA:  Right mid posterior back pain for 1 month after a fall. EXAM: RIGHT RIBS AND CHEST - 3+ VIEW COMPARISON:  09/15/2020 FINDINGS: The cardiomediastinal silhouette is within normal limits. The lungs are hyperinflated. There is persistent blunting of the right costophrenic angle which could reflect a very small pleural effusion or be secondary to hyperinflation and pleural thickening. No airspace consolidation, edema, or pneumothorax is identified. A posterior right tenth rib fracture demonstrates unchanged minimal displacement, possibly with some very early callus formation. No new rib fracture is identified. IMPRESSION: 1. Unchanged minimal displacement of  a posterior right tenth rib fracture, possibly with early callus formation. 2. No new fracture identified. 3. Possible trace right pleural effusion.  No pneumothorax. Electronically Signed   By: Logan Bores M.D.   On: 10/06/2020 15:11    Procedures Procedures (including critical care time)  Medications Ordered in ED Medications  dexamethasone (DECADRON) injection 10 mg (10 mg Intravenous Given 10/07/20 0508)  albuterol (VENTOLIN HFA) 108 (90 Base) MCG/ACT inhaler 6 puff (6 puffs Inhalation Given 10/07/20 0510)    ED Course  I have reviewed the triage vital signs and the nursing notes.  Pertinent labs & imaging results that were available during my care of the patient were reviewed by me and considered in my medical decision making (see chart for details).  Clinical Course as of Oct 07 704  Tue Oct 07, 2020  0627 Patient reports improvement after albuterol inhaler.  She is satting 94% and resting comfortably.  He is in no respiratory distress.  Suspect COPD with exacerbation.   [CH]    Clinical Course User Index [CH] Demontay Grantham, Barbette Hair, MD   MDM Rules/Calculators/A&P                          Patient presents with shortness of breath.  Recent rib fracture.  Not using her incentive spirometer.  She has an extensive smoking history.  Denies formal diagnosis of COPD but given exam, suspicion that she likely has underlying COPD.  She is nontoxic and afebrile.  O2 sats 97%.  She is in no respiratory distress.  She was given an inhaler and a dose of Decadron chest x-ray obtained shows no evidence of pneumothorax or pneumonia.  Lab work obtained and without any significant abnormality including metabolic abnormality.  EKG without acute ischemic changes.  Doubt ACS or infectious etiology.  On recheck, she states she feels much better.  She ambulated and maintain her pulse ox greater than 90%.  Will discharge home with an inhaler and doxycycline for COPD exacerbation.  After history, exam, and  medical workup I feel the patient has been appropriately medically screened and is safe for discharge home. Pertinent  diagnoses were discussed with the patient. Patient was given return precautions.  Final Clinical Impression(s) / ED Diagnoses Final diagnoses:  COPD exacerbation ([place])    Rx / DC Orders ED Discharge Orders         Ordered    doxycycline (VIBRAMYCIN) 100 MG capsule  2 times daily        10/07/20 0703    albuterol (VENTOLIN HFA) 108 (90 Base) MCG/ACT inhaler  Every 4 hours PRN        10/07/20 0703           Merryl Hacker, MD 10/07/20 [phone]

## 2020-10-07 NOTE — ED Triage Notes (Signed)
Patient arrived stating she has a broken rib from a few weeks ago and has had intermittent shortness of breath since. States tonight it worsened. Declines any pain. Reports not using an incentive spirometer.

## 2020-10-07 NOTE — ED Notes (Signed)
Attempted to call Maudie Mercury, a friend of patient. Unable to reach anyone, Message left for kim at this time.

## 2020-10-07 NOTE — ED Notes (Signed)
Ambulated Pt down the hallway. Pt ambulated with no assistance. Oxygen was at 94% and dropped to 90% but when back up to 94%.

## 2020-10-07 NOTE — Discharge Instructions (Addendum)
You were seen today for shortness of breath.  This is likely related to your smoking and COPD.  Continue with the inhaler as needed.  Use your incentive spirometer.  Take medications as prescribed.

## 2020-10-07 NOTE — ED Notes (Signed)
Pt attempted to call her son for transport home. Pt unable to reach son at this time. Will attempt to contact family again momentarily

## 2020-10-07 NOTE — ED Notes (Signed)
This RN attempted to call pts son at this time. No answer, unable to leave message. Will attempt to call back

## 2020-11-05 NOTE — Progress Notes (Deleted)
   I, Christoper Fabian, LAT, ATC, am serving as scribe for Dr. Clementeen Graham.  Brenda Gibson is a 80 y.o. female who presents to [facility] at [facility] today for f/u of R post rib pain after falling and hitting her lower back on the edge of the bathtub on 09/10/20.  She was last seen by Dr. Denyse Amass on 10/06/20 and noted con't soreness in her rib.  She con't to take a limited amount of hydrocodone but has not been wearing/using a rib binder as suggested.  Since her last visit, pt reports   Diagnostic imaging: R rib/chest XR- 10/06/20, 09/15/20  Pertinent review of systems: ***  Relevant historical information: ***   Exam:  There were no vitals taken for this visit. General: Well Developed, well nourished, and in no acute distress.   MSK: ***    Lab and Radiology Results No results found for this or any previous visit (from the past 72 hour(s)). No results found.     Assessment and Plan: 80 y.o. female with ***   PDMP not reviewed this encounter. No orders of the defined types were placed in this encounter.  No orders of the defined types were placed in this encounter.    Discussed warning signs or symptoms. Please see discharge instructions. Patient expresses understanding.   ***

## 2020-11-06 ENCOUNTER — Ambulatory Visit: Payer: Medicare Other | Admitting: Family Medicine

## 2021-03-15 ENCOUNTER — Other Ambulatory Visit: Payer: Self-pay

## 2021-03-15 ENCOUNTER — Emergency Department (HOSPITAL_COMMUNITY)
Admission: EM | Admit: 2021-03-15 | Discharge: 2021-03-16 | Disposition: A | Discharge status: Home / Self Care | Payer: Medicare Other | Attending: Emergency Medicine | Admitting: Emergency Medicine

## 2021-03-15 ENCOUNTER — Encounter (HOSPITAL_COMMUNITY): Payer: Self-pay | Admitting: *Deleted

## 2021-03-15 ENCOUNTER — Emergency Department (HOSPITAL_COMMUNITY): Payer: Medicare Other

## 2021-03-15 DIAGNOSIS — R0602 Shortness of breath: Secondary | ICD-10-CM | POA: Diagnosis not present

## 2021-03-15 DIAGNOSIS — Z85828 Personal history of other malignant neoplasm of skin: Secondary | ICD-10-CM | POA: Diagnosis not present

## 2021-03-15 DIAGNOSIS — F17210 Nicotine dependence, cigarettes, uncomplicated: Secondary | ICD-10-CM | POA: Insufficient documentation

## 2021-03-15 DIAGNOSIS — J441 Chronic obstructive pulmonary disease with (acute) exacerbation: Secondary | ICD-10-CM | POA: Diagnosis not present

## 2021-03-15 DIAGNOSIS — I10 Essential (primary) hypertension: Secondary | ICD-10-CM | POA: Diagnosis not present

## 2021-03-15 DIAGNOSIS — Z79899 Other long term (current) drug therapy: Secondary | ICD-10-CM | POA: Insufficient documentation

## 2021-03-15 LAB — CBC WITH DIFFERENTIAL/PLATELET
Abs Immature Granulocytes: 0.02 10*3/uL (ref 0.00–0.07)
Basophils Absolute: 0.1 10*3/uL (ref 0.0–0.1)
Basophils Relative: 1 %
Eosinophils Absolute: 0 10*3/uL (ref 0.0–0.5)
Eosinophils Relative: 0 %
HCT: 39.3 % (ref 36.0–46.0)
Hemoglobin: 12.5 g/dL (ref 12.0–15.0)
Immature Granulocytes: 0 %
Lymphocytes Relative: 28 %
Lymphs Abs: 2.2 10*3/uL (ref 0.7–4.0)
MCH: 28.5 pg (ref 26.0–34.0)
MCHC: 31.8 g/dL (ref 30.0–36.0)
MCV: 89.5 fL (ref 80.0–100.0)
Monocytes Absolute: 0.7 10*3/uL (ref 0.1–1.0)
Monocytes Relative: 9 %
Neutro Abs: 5 10*3/uL (ref 1.7–7.7)
Neutrophils Relative %: 62 %
Platelets: 293 10*3/uL (ref 150–400)
RBC: 4.39 MIL/uL (ref 3.87–5.11)
RDW: 14.8 % (ref 11.5–15.5)
WBC: 8 10*3/uL (ref 4.0–10.5)
nRBC: 0 % (ref 0.0–0.2)

## 2021-03-15 LAB — COMPREHENSIVE METABOLIC PANEL
ALT: 16 U/L (ref 0–44)
AST: 17 U/L (ref 15–41)
Albumin: 3.9 g/dL (ref 3.5–5.0)
Alkaline Phosphatase: 60 U/L (ref 38–126)
Anion gap: 6 (ref 5–15)
BUN: 20 mg/dL (ref 8–23)
CO2: 29 mmol/L (ref 22–32)
Calcium: 9.2 mg/dL (ref 8.9–10.3)
Chloride: 106 mmol/L (ref 98–111)
Creatinine, Ser: 0.55 mg/dL (ref 0.44–1.00)
GFR, Estimated: >60 mL/min (ref >60)
Glucose, Bld: 92 mg/dL (ref 70–99)
Potassium: 3.7 mmol/L (ref 3.5–5.1)
Sodium: 141 mmol/L (ref 135–145)
Total Bilirubin: 0.4 mg/dL (ref 0.3–1.2)
Total Protein: 6.4 g/dL — ABNORMAL LOW (ref 6.5–8.1)

## 2021-03-15 MED ORDER — METHYLPREDNISOLONE SODIUM SUCC 125 MG IJ SOLR
80.0000 mg | Freq: Once | INTRAMUSCULAR | Status: AC
  Administered 2021-03-15: 80 mg via INTRAVENOUS
  Filled 2021-03-15: qty 2

## 2021-03-15 MED ORDER — ALBUTEROL SULFATE HFA 108 (90 BASE) MCG/ACT IN AERS: 2.0000 | INHALATION_SPRAY | RESPIRATORY_TRACT | Status: DC | PRN

## 2021-03-15 MED ORDER — ALBUTEROL SULFATE HFA 108 (90 BASE) MCG/ACT IN AERS
4.0000 | INHALATION_SPRAY | RESPIRATORY_TRACT | Status: DC | PRN
  Administered 2021-03-15: 4 via RESPIRATORY_TRACT
  Filled 2021-03-15: qty 6.7

## 2021-03-15 NOTE — ED Notes (Signed)
EMS VS BP 146/96 P 80 RR 18 Spo2 98%  Temp: 98.8

## 2021-03-15 NOTE — ED Triage Notes (Signed)
Pt brought in from home by ems for intermittent sob for 3 weeks.  Pt states that sob increases with activity.  No CP.  No PCP

## 2021-03-15 NOTE — ED Notes (Signed)
Pt ambulated without difficulty in hall, Spo2 97-100% on RA

## 2021-03-15 NOTE — ED Provider Notes (Incomplete)
[facility] DEPT Provider Note   CSN: [id number] Arrival date & time: 03/15/21  2120     History Chief Complaint  Patient presents with  . Shortness of Breath    DETTA MELLIN is a 80 y.o. female with PMH of HTN and COPD who presents to the ED via EMS for a 3-week history of shortness of breath.  On my examination, patient endorses a 50-pack-year smoking history.  She states that she started smoking when she was 80 years old.  She has not seen a primary care provider in decades and has never had a colonoscopy performed.  She states that for the past few weeks she has been feeling intermittent shortness of breath, worse at night.  She states that it is when laying down flat as well as sometimes with increased activity/exertion.  Patient reports that her son lives with her and can sometimes help provide care for her.  She denies any fevers, chills, cough, chest pain, palpitations, abdominal pain, back pain, history of clots or clotting disorder, nausea or vomiting, hemoptysis, syncope, or other symptoms.  I reviewed patient's medical record and she was evaluated in the ER on 10/07/2020 for similar symptoms.  She was treated with Decadron and inhaler, with good effect.  She states that after discharge home with medications, she felt improved.  HPI     Past Medical History:  Diagnosis Date  . Basal cell carcinoma of ala nasi   . Cataract   . Hypertension   . Knee pain, bilateral   . Measles   . Myalgia   . Seborrheic dermatitis of scalp   . Varicella     Patient Active Problem List   Diagnosis Date Noted  . Closed fracture of left tibial plateau 08/14/2020  . Physical deconditioning 07/30/2016  . Small bowel obstruction ([place])   . Hypokalemia 07/04/2016  . Hypomagnesemia 07/04/2016  . Urinary retention 07/04/2016  . Ileus, postoperative ([place]) 07/02/2016  . Malnutrition of moderate degree 06/28/2016  . SBO (small bowel obstruction) s/p lysis  of adhesion 06/25/2016 06/21/2016  . Enteritis 06/21/2016  . 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 Elevated blood pressure reading without diagnosis of hypertension 06/19/2016  . Medicare annual wellness visit, subsequent 02/23/2016  . Female bladder prolapse 09/03/2014  . Routine general medical examination at a health care facility 08/12/2014  . Osteoporosis 08/12/2014  . Overactive bladder 08/02/2013  . Essential hypertension, benign 06/05/2013  . 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 Ganglion cyst of wrist 02/22/2013  . Tobacco abuse 02/24/2011    Past Surgical History:  Procedure Laterality Date  . BASAL CELL CARCINOMA EXCISION     '11 Ally[facility] Sabal)  . CATARACT EXTRACTION W/ INTRAOCULAR LENS IMPLANT     right eye. '09 (Dr. Dolores Lory)  . LAPAROTOMY N/A 06/25/2016   Procedure: EXPLORATORY LAPAROTOMY, lysis of adhesion;  Surgeon: Coralie Keens, MD;  Location: [facility];  Service: General;  Laterality: N/A;  . LYSIS OF ADHESION  06/25/2016   Dr Nedra Hai     OB History   No obstetric history on file.     Family History  Problem Relation Age of Onset  . Alcohol abuse Mother   . Cancer Father 34       stomach cancer  . Prostate cancer Father   . Hypertension Maternal Grandfather   . Diabetes Neg Hx   . Heart disease Neg Hx     Social History   Tobacco Use  . Smoking status: Current Every Day Smoker  Packs/day: 0.60    Years: 52.00    Pack years: 31.20    Types: Cigarettes  . Smokeless tobacco: Never Used  . Tobacco comment: Pt smokes 5 cigarettes per day  Vaping Use  . Vaping Use: Never used  Substance Use Topics  . Alcohol use: Yes    Comment: rare use  . Drug use: No    Home Medications Prior to Admission medications   Medication Sig Start Date End Date Taking? Authorizing Provider  albuterol (VENTOLIN HFA) 108 (90 Base) MCG/ACT inhaler Inhale 2 puffs into the lungs every 4 (four) hours as needed for wheezing or shortness of breath. 10/07/20   Horton, Barbette Hair,  MD  amLODipine (NORVASC) 10 MG tablet TAKE 1 TABLET(10 MG) BY MOUTH DAILY 03/16/20   Plotnikov, Evie Lacks, MD  doxycycline (VIBRAMYCIN) 100 MG capsule Take 1 capsule (100 mg total) by mouth 2 (two) times daily. 10/07/20   Horton, Barbette Hair, MD  HYDROcodone-acetaminophen (NORCO/VICODIN) 5-325 MG tablet Take 1 tablet by mouth every 6 (six) hours as needed. 09/22/20   Gregor Hams, MD  triamcinolone cream (KENALOG) 0.1 % Apply 1 application topically 2 (two) times daily. 05/19/18   Lance Sell, NP  trifluoperazine (STELAZINE) 5 MG tablet Take 0.5 tablets by mouth every evening. Take 1/2 tablet to = 2.5 mg qd 06/04/16   [provider]    Allergies    Patient has no known allergies.  Review of Systems   Review of Systems  All other systems reviewed and are negative.   Physical Exam Updated Vital Signs BP (!) 163/80 (BP Location: Right Arm)   Pulse 69   Temp 98.6 F (37 C) (Oral)   Resp (!) 22   SpO2 99%   Physical Exam Vitals and nursing note reviewed. Exam conducted with a chaperone present.  Constitutional:      General: She is not in acute distress.    Appearance: She is not toxic-appearing or diaphoretic.     Comments: Frail appearing.  HENT:     Head: Normocephalic and atraumatic.  Eyes:     General: No scleral icterus.    Conjunctiva/sclera: Conjunctivae normal.  Cardiovascular:     Rate and Rhythm: Normal rate and regular rhythm.     Pulses: Normal pulses.  Pulmonary:     Effort: Pulmonary effort is normal. No respiratory distress.     Comments: Patient is not exhibiting any significant increased work of breathing.  No tachypnea.  98% SPO2 on room air.  Answers in full sentences.  No significant wheezing appreciated on exam.  Diminished breath sounds bilaterally. Musculoskeletal:     Cervical back: Normal range of motion.     Right lower leg: No edema.     Left lower leg: No edema.  Skin:    General: Skin is dry.  Neurological:     Mental Status: She  is alert.     GCS: GCS eye subscore is 4. GCS verbal subscore is 5. GCS motor subscore is 6.  Psychiatric:        Mood and Affect: Mood normal.        Behavior: Behavior normal.        Thought Content: Thought content normal.     ED Results / Procedures / Treatments   Labs (all labs ordered are listed, but only abnormal results are displayed) Labs Reviewed  COMPREHENSIVE METABOLIC PANEL - Abnormal; Notable for the following components:      Result Value   Total  Protein 6.4 (*)    All other components within normal limits  CBC WITH DIFFERENTIAL/PLATELET  BRAIN NATRIURETIC PEPTIDE    EKG EKG Interpretation  Date/Time:  Sunday Mar 15 2021 21:50:32 EDT Ventricular Rate:  68 PR Interval:  154 QRS Duration: 87 QT Interval:  389 QTC Calculation: 414 R Axis:   68 Text Interpretation: Sinus rhythm Biatrial enlargement RSR' in V1 or V2, right VCD or RVH Nonspecific T abnrm, anterolateral leads Confirmed by Dene Gentry [phone]) on 03/15/2021 10:49:28 PM   Radiology DG Chest 2 View  Result Date: 03/15/2021 CLINICAL DATA:  Shortness of breath. EXAM: CHEST - 2 VIEW COMPARISON:  October 07, 2020 FINDINGS: Lungs hyperexpanded. No edema or airspace opacity. Nipple shadow noted on the right. Heart size and pulmonary vascularity are normal. No adenopathy. There is degenerative change in the thoracic spine. There is mild anterior wedging of a midthoracic vertebral body, stable. IMPRESSION: Lungs hyperexpanded without edema or airspace opacity. Heart size within normal limits. Electronically Signed   By: Lowella Grip III M.D.   On: 03/15/2021 23:44    Procedures Procedures {Remember to document critical care time when appropriate:1}  Medications Ordered in ED Medications  albuterol (VENTOLIN HFA) 108 (90 Base) MCG/ACT inhaler 4 puff (4 puffs Inhalation Given 03/15/21 2259)  methylPREDNISolone sodium succinate (SOLU-MEDROL) 125 mg/2 mL injection 80 mg (80 mg Intravenous Given 03/15/21  2258)    ED Course  I have reviewed the triage vital signs and the nursing notes.  Pertinent labs & imaging results that were available during my care of the patient were reviewed by me and considered in my medical decision making (see chart for details).    MDM Rules/Calculators/A&P                          GRACY EHLY was evaluated in Emergency Department on 03/15/2021 for the symptoms described in the history of present illness. She was evaluated in the context of the global COVID-19 pandemic, which necessitated consideration that the patient might be at risk for infection with the SARS-CoV-2 virus that causes COVID-19. Institutional protocols and algorithms that pertain to the evaluation of patients at risk for COVID-19 are in a state of rapid change based on information released by regulatory bodies including the CDC and federal and state organizations. These policies and algorithms were followed during the patient's care in the ED.  I personally reviewed patient's medical chart and all notes from triage and staff during today's encounter. I have also ordered and reviewed all labs and imaging that I felt to be medically necessary in the evaluation of this patient's complaints and with consideration of their physical exam. If needed, translation services were available and utilized.    Patient was able to ambulate here in the ED and maintain saturation of 97 to 100%.     Final Clinical Impression(s) / ED Diagnoses Final diagnoses:  None    Rx / DC Orders ED Discharge Orders    None

## 2021-03-15 NOTE — ED Provider Notes (Signed)
Brenda Gibson   CSN: [id number] Arrival date & time: 03/15/21  2120     History Chief Complaint  Patient presents with  . Shortness of Breath    Brenda Gibson is a 80 y.o. female with PMH of HTN and COPD who presents to the ED via EMS for a 3-week history of shortness of breath.  On my examination, patient endorses a 50-pack-year smoking history.  She states that she started smoking when she was 80 years old.  She has not seen a primary care provider in decades and has never had a colonoscopy performed.  She states that for the past few weeks she has been feeling intermittent shortness of breath, worse at night.  She states that it is when laying down flat as well as sometimes with increased activity/exertion.  Patient reports that her son lives with her and can sometimes help provide care for her.  She denies any fevers, chills, cough, chest pain, palpitations, abdominal pain, back pain, history of clots or clotting disorder, nausea or vomiting, hemoptysis, syncope, or other symptoms.  I reviewed patient's medical record and she was evaluated in the ER on 10/07/2020 for similar symptoms.  She was treated with Decadron and inhaler, with good effect.  She states that after discharge home with medications, she felt improved.  HPI     Past Medical History:  Diagnosis Date  . Basal cell carcinoma of ala nasi   . Cataract   . Hypertension   . Knee pain, bilateral   . Measles   . Myalgia   . Seborrheic dermatitis of scalp   . Varicella     Patient Active Problem List   Diagnosis Date Noted  . Closed fracture of left tibial plateau 08/14/2020  . Physical deconditioning 07/30/2016  . Small bowel obstruction ([place])   . Hypokalemia 07/04/2016  . Hypomagnesemia 07/04/2016  . Urinary retention 07/04/2016  . Ileus, postoperative ([place]) 07/02/2016  . Malnutrition of moderate degree 06/28/2016  . SBO (small bowel obstruction) s/p lysis  of adhesion 06/25/2016 06/21/2016  . Enteritis 06/21/2016  . 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 Elevated blood pressure reading without diagnosis of hypertension 06/19/2016  . Medicare annual wellness visit, subsequent 02/23/2016  . Female bladder prolapse 09/03/2014  . Routine general medical examination at a health care facility 08/12/2014  . Osteoporosis 08/12/2014  . Overactive bladder 08/02/2013  . Essential hypertension, benign 06/05/2013  . 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 Ganglion cyst of wrist 02/22/2013  . Tobacco abuse 02/24/2011    Past Surgical History:  Procedure Laterality Date  . BASAL CELL CARCINOMA EXCISION     '11 Allyson Sabal)  . CATARACT EXTRACTION W/ INTRAOCULAR LENS IMPLANT     right eye. '09 (Dr. Dolores Lory)  . LAPAROTOMY N/A 06/25/2016   Procedure: EXPLORATORY LAPAROTOMY, lysis of adhesion;  Surgeon: Coralie Keens, MD;  Location: [facility];  Service: General;  Laterality: N/A;  . LYSIS OF ADHESION  06/25/2016   Dr Nedra Hai     OB History   No obstetric history on file.     Family History  Problem Relation Age of Onset  . Alcohol abuse Mother   . Cancer Father 74       stomach cancer  . Prostate cancer Father   . Hypertension Maternal Grandfather   . Diabetes Neg Hx   . Heart disease Neg Hx     Social History   Tobacco Use  . Smoking status: Current Every Day Smoker  Packs/day: 0.60    Years: 52.00    Pack years: 31.20    Types: Cigarettes  . Smokeless tobacco: Never Used  . Tobacco comment: Pt smokes 5 cigarettes per day  Vaping Use  . Vaping Use: Never used  Substance Use Topics  . Alcohol use: Yes    Comment: rare use  . Drug use: No    Home Medications Prior to Admission medications   Medication Sig Start Date End Date Taking? Authorizing Provider  azithromycin (ZITHROMAX Z-PAK) 250 MG tablet 500 mg PO Day 1 250 mg PO Day 2-5 03/16/21  Yes Corena Herter, PA-C  predniSONE (DELTASONE) 50 MG tablet Take 1 tablet (50 mg total) by  mouth daily with breakfast. 03/16/21  Yes Corena Herter, PA-C  albuterol (VENTOLIN HFA) 108 (90 Base) MCG/ACT inhaler Inhale 2 puffs into the lungs every 4 (four) hours as needed for wheezing or shortness of breath. 10/07/20   Gibson, Barbette Hair, MD  amLODipine (NORVASC) 10 MG tablet TAKE 1 TABLET(10 MG) BY MOUTH DAILY 03/16/20   Plotnikov, Evie Lacks, MD  doxycycline (VIBRAMYCIN) 100 MG capsule Take 1 capsule (100 mg total) by mouth 2 (two) times daily. 10/07/20   Gibson, Barbette Hair, MD  HYDROcodone-acetaminophen (NORCO/VICODIN) 5-325 MG tablet Take 1 tablet by mouth every 6 (six) hours as needed. 09/22/20   Gregor Hams, MD  triamcinolone cream (KENALOG) 0.1 % Apply 1 application topically 2 (two) times daily. 05/19/18   Lance Sell, NP  trifluoperazine (STELAZINE) 5 MG tablet Take 0.5 tablets by mouth every evening. Take 1/2 tablet to = 2.5 mg qd 06/04/16   [provider]    Allergies    Patient has no known allergies.  Review of Systems   Review of Systems  All other systems reviewed and are negative.   Physical Exam Updated Vital Signs BP (!) 164/90   Pulse 65   Temp 98.6 F (37 C) (Oral)   Resp 20   SpO2 97%   Physical Exam Vitals and nursing Gibson reviewed. Exam conducted with a chaperone present.  Constitutional:      General: She is not in acute distress.    Appearance: She is not toxic-appearing or diaphoretic.     Comments: Frail appearing.  HENT:     Head: Normocephalic and atraumatic.  Eyes:     General: No scleral icterus.    Conjunctiva/sclera: Conjunctivae normal.  Cardiovascular:     Rate and Rhythm: Normal rate and regular rhythm.     Pulses: Normal pulses.  Pulmonary:     Effort: Pulmonary effort is normal. No respiratory distress.     Comments: Patient is not exhibiting any significant increased work of breathing.  No tachypnea.  98% SPO2 on room air.  Answers in full sentences.  No significant wheezing appreciated on exam.  Diminished  breath sounds bilaterally. Musculoskeletal:     Cervical back: Normal range of motion.     Right lower leg: No edema.     Left lower leg: No edema.  Skin:    General: Skin is dry.  Neurological:     Mental Status: She is alert.     GCS: GCS eye subscore is 4. GCS verbal subscore is 5. GCS motor subscore is 6.  Psychiatric:        Mood and Affect: Mood normal.        Behavior: Behavior normal.        Thought Content: Thought content normal.  ED Results / Procedures / Treatments   Labs (all labs ordered are listed, but only abnormal results are displayed) Labs Reviewed  COMPREHENSIVE METABOLIC PANEL - Abnormal; Notable for the following components:      Result Value   Total Protein 6.4 (*)    All other components within normal limits  CBC WITH DIFFERENTIAL/PLATELET  BRAIN NATRIURETIC PEPTIDE    EKG EKG Interpretation  Date/Time:  Sunday Mar 15 2021 21:50:32 EDT Ventricular Rate:  68 PR Interval:  154 QRS Duration: 87 QT Interval:  389 QTC Calculation: 414 R Axis:   68 Text Interpretation: Sinus rhythm Biatrial enlargement RSR' in V1 or V2, right VCD or RVH Nonspecific T abnrm, anterolateral leads Confirmed by Dene Gentry [phone]) on 03/15/2021 10:49:28 PM   Radiology DG Chest 2 View  Result Date: 03/15/2021 CLINICAL DATA:  Shortness of breath. EXAM: CHEST - 2 VIEW COMPARISON:  October 07, 2020 FINDINGS: Lungs hyperexpanded. No edema or airspace opacity. Nipple shadow noted on the right. Heart size and pulmonary vascularity are normal. No adenopathy. There is degenerative change in the thoracic spine. There is mild anterior wedging of a midthoracic vertebral body, stable. IMPRESSION: Lungs hyperexpanded without edema or airspace opacity. Heart size within normal limits. Electronically Signed   By: Lowella Grip III M.D.   On: 03/15/2021 23:44    Procedures Procedures   Medications Ordered in ED Medications  albuterol (VENTOLIN HFA) 108 (90 Base) MCG/ACT  inhaler 4 puff (4 puffs Inhalation Given 03/15/21 2259)  methylPREDNISolone sodium succinate (SOLU-MEDROL) 125 mg/2 mL injection 80 mg (80 mg Intravenous Given 03/15/21 2258)    ED Course  I have reviewed the triage vital signs and the nursing notes.  Pertinent labs & imaging results that were available during my care of the patient were reviewed by me and considered in my medical decision making (see chart for details).    MDM Rules/Calculators/A&P                          Brenda Gibson was evaluated in Emergency Department on 03/16/2021 for the symptoms described in the history of present illness. She was evaluated in the context of the global COVID-19 pandemic, which necessitated consideration that the patient might be at risk for infection with the SARS-CoV-2 virus that causes COVID-19. Institutional protocols and algorithms that pertain to the evaluation of patients at risk for COVID-19 are in a state of rapid change based on information released by regulatory bodies including the CDC and federal and state organizations. These policies and algorithms were followed during the patient's care in the ED.  I personally reviewed patient's medical chart and all notes from triage and staff during today's encounter. I have also ordered and reviewed all labs and imaging that I felt to be medically necessary in the evaluation of this patient's complaints and with consideration of their physical exam. If needed, translation services were available and utilized.   Patient is in the ED with nonspecific shortness of breath symptoms.  She states that it is worse both with exertion/activity as well as laying flat.  There is no peripheral edema seen on my exam, but given concern for possible orthopnea will obtain BNP in addition to basic laboratory work-up.  She is not demonstrating any increased work of breathing.  Lungs are clear to auscultation bilaterally.  She denies any fevers, chills, cough, chest pain,  palpitations, or any other concerning history.  She is frail appearing, but otherwise  benign exam.  Will treat with albuterol and Solu-Medrol.  Suspect COPD exacerbation.  Plain films are personally reviewed and demonstrate hyperexpanded lungs consistent with emphysema/COPD.  No edema or airspace opacity.  Her laboratory work-up is entirely unremarkable.  No leukocytosis concerning for infection.  BNP within normal limits.  No electrolyte derangement, liver impairment, or renal impairment on CMP.  Patient was able to ambulate here in the ED and maintain saturation of 97 to 100%.  On subsequent evaluation, patient was feeling improved.  We will discharge her home with continued prednisone burst and Z-Pak to cover for superimposed infection.  Encouraged her to use albuterol, as needed.  Emphasized the importance of outpatient establishment with primary care provider for ongoing evaluation and management of her health and wellbeing.  ER return precautions discussed.  Patient voices understanding and is agreeable to the plan.   Final Clinical Impression(s) / ED Diagnoses Final diagnoses:  COPD exacerbation ([place])    Rx / DC Orders ED Discharge Orders         Ordered    predniSONE (DELTASONE) 50 MG tablet  Daily with breakfast        03/16/21 0030    azithromycin (ZITHROMAX Z-PAK) 250 MG tablet        03/16/21 0030           Corena Herter, PA-C 03/16/21 0032    Valarie Merino, MD 03/17/21 1511

## 2021-03-16 LAB — BRAIN NATRIURETIC PEPTIDE: B Natriuretic Peptide: 80.7 pg/mL (ref 0.0–100.0)

## 2021-03-16 MED ORDER — AZITHROMYCIN 250 MG PO TABS: ORAL_TABLET | ORAL | 0 refills | Status: DC

## 2021-03-16 MED ORDER — PREDNISONE 50 MG PO TABS: 50.0000 mg | ORAL_TABLET | Freq: Every day | ORAL | 0 refills | Status: DC

## 2021-03-16 NOTE — Discharge Instructions (Addendum)
Your work-up today is suggestive of COPD exacerbation.  I am glad that you are feeling improved.  Please take the medications, as directed.  I highly encourage you to continue cutting back on your tobacco use.  I also would like for you to follow-up with your primary care provider, Caesar Chestnut NP, for ongoing evaluation and management.  Return to the ER seek immediate medical attention if you experience any new or worsening symptoms.

## 2021-04-09 ENCOUNTER — Emergency Department (HOSPITAL_COMMUNITY): Payer: Medicare Other

## 2021-04-09 ENCOUNTER — Emergency Department: Admit: 2021-04-09 | Payer: Medicare Other

## 2021-04-09 ENCOUNTER — Emergency Department (HOSPITAL_COMMUNITY)
Admission: EM | Admit: 2021-04-09 | Discharge: 2021-04-09 | Disposition: A | Discharge status: Home / Self Care | Payer: Medicare Other | Attending: Emergency Medicine | Admitting: Emergency Medicine

## 2021-04-09 ENCOUNTER — Other Ambulatory Visit: Payer: Self-pay

## 2021-04-09 DIAGNOSIS — R0602 Shortness of breath: Secondary | ICD-10-CM | POA: Diagnosis not present

## 2021-04-09 DIAGNOSIS — R0600 Dyspnea, unspecified: Secondary | ICD-10-CM | POA: Diagnosis not present

## 2021-04-09 DIAGNOSIS — Z79899 Other long term (current) drug therapy: Secondary | ICD-10-CM | POA: Insufficient documentation

## 2021-04-09 DIAGNOSIS — J439 Emphysema, unspecified: Secondary | ICD-10-CM | POA: Diagnosis not present

## 2021-04-09 DIAGNOSIS — I10 Essential (primary) hypertension: Secondary | ICD-10-CM | POA: Insufficient documentation

## 2021-04-09 DIAGNOSIS — Z85828 Personal history of other malignant neoplasm of skin: Secondary | ICD-10-CM | POA: Insufficient documentation

## 2021-04-09 DIAGNOSIS — R0689 Other abnormalities of breathing: Secondary | ICD-10-CM | POA: Diagnosis not present

## 2021-04-09 DIAGNOSIS — F17210 Nicotine dependence, cigarettes, uncomplicated: Secondary | ICD-10-CM | POA: Diagnosis not present

## 2021-04-09 DIAGNOSIS — I499 Cardiac arrhythmia, unspecified: Secondary | ICD-10-CM | POA: Diagnosis not present

## 2021-04-09 DIAGNOSIS — R0902 Hypoxemia: Secondary | ICD-10-CM | POA: Diagnosis not present

## 2021-04-09 LAB — CBC WITH DIFFERENTIAL/PLATELET
Abs Immature Granulocytes: 0.02 10*3/uL (ref 0.00–0.07)
Basophils Absolute: 0 10*3/uL (ref 0.0–0.1)
Basophils Relative: 1 %
Eosinophils Absolute: 0.2 10*3/uL (ref 0.0–0.5)
Eosinophils Relative: 3 %
HCT: 37.8 % (ref 36.0–46.0)
Hemoglobin: 11.8 g/dL — ABNORMAL LOW (ref 12.0–15.0)
Immature Granulocytes: 0 %
Lymphocytes Relative: 30 %
Lymphs Abs: 2.1 10*3/uL (ref 0.7–4.0)
MCH: 28.4 pg (ref 26.0–34.0)
MCHC: 31.2 g/dL (ref 30.0–36.0)
MCV: 91.1 fL (ref 80.0–100.0)
Monocytes Absolute: 0.8 10*3/uL (ref 0.1–1.0)
Monocytes Relative: 11 %
Neutro Abs: 3.7 10*3/uL (ref 1.7–7.7)
Neutrophils Relative %: 55 %
Platelets: 265 10*3/uL (ref 150–400)
RBC: 4.15 MIL/uL (ref 3.87–5.11)
RDW: 15.5 % (ref 11.5–15.5)
WBC: 6.8 10*3/uL (ref 4.0–10.5)
nRBC: 0 % (ref 0.0–0.2)

## 2021-04-09 LAB — COMPREHENSIVE METABOLIC PANEL
ALT: 21 U/L (ref 0–44)
AST: 21 U/L (ref 15–41)
Albumin: 3.6 g/dL (ref 3.5–5.0)
Alkaline Phosphatase: 58 U/L (ref 38–126)
Anion gap: 7 (ref 5–15)
BUN: 15 mg/dL (ref 8–23)
CO2: 26 mmol/L (ref 22–32)
Calcium: 8.9 mg/dL (ref 8.9–10.3)
Chloride: 107 mmol/L (ref 98–111)
Creatinine, Ser: 0.54 mg/dL (ref 0.44–1.00)
GFR, Estimated: >60 mL/min (ref >60)
Glucose, Bld: 125 mg/dL — ABNORMAL HIGH (ref 70–99)
Potassium: 3.3 mmol/L — ABNORMAL LOW (ref 3.5–5.1)
Sodium: 140 mmol/L (ref 135–145)
Total Bilirubin: 0.3 mg/dL (ref 0.3–1.2)
Total Protein: 6 g/dL — ABNORMAL LOW (ref 6.5–8.1)

## 2021-04-09 LAB — TROPONIN I (HIGH SENSITIVITY): Troponin I (High Sensitivity): 4 ng/L (ref <18)

## 2021-04-09 LAB — BRAIN NATRIURETIC PEPTIDE: B Natriuretic Peptide: 67.6 pg/mL (ref 0.0–100.0)

## 2021-04-09 NOTE — ED Provider Notes (Signed)
[place] DEPT Provider Note   CSN: [id number] Arrival date & time: 04/09/21  0145     History Chief Complaint  Patient presents with   Shortness of Breath    Brenda Gibson is a 80 y.o. female.  Patient is a 80 year old female with past medical history of hypertension.  Patient presenting today for evaluation of shortness of breath.  This started 2 hours prior to presentation.  She denies any fevers, chills, chest pain, or cough.  She denies any leg swelling.  Patient denies history of asthma or other breathing issues.  The history is provided by the patient.  Shortness of Breath Severity:  Moderate Onset quality:  Sudden Duration:  2 hours Timing:  Constant Progression:  Unchanged Chronicity:  New Relieved by:  Nothing Worsened by:  Nothing Ineffective treatments:  None tried Associated symptoms: no chest pain, no cough and no fever       Past Medical History:  Diagnosis Date   Basal cell carcinoma of ala nasi    Cataract    Hypertension    Knee pain, bilateral    Measles    Myalgia    Seborrheic dermatitis of scalp    Varicella     Patient Active Problem List   Diagnosis Date Noted   Closed fracture of left tibial plateau 08/14/2020   Physical deconditioning 07/30/2016   Small bowel obstruction ([place])    Hypokalemia 07/04/2016   Hypomagnesemia 07/04/2016   Urinary retention 07/04/2016   Ileus, postoperative ([place]) 07/02/2016   Malnutrition of moderate degree 06/28/2016   SBO (small bowel obstruction) s/p lysis of adhesion 06/25/2016 06/21/2016   Enteritis 06/21/2016   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   Elevated blood pressure reading without diagnosis of hypertension 06/19/2016   Medicare annual wellness visit, subsequent 02/23/2016   Female bladder prolapse 09/03/2014   Routine general medical examination at a health care facility 08/12/2014   Osteoporosis 08/12/2014   Overactive bladder 08/02/2013   Essential  hypertension, benign 06/05/2013   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 '09 (Dr. Dolores Lory)   LAPAROTOMY N/A 06/25/2016   Procedure: EXPLORATORY LAPAROTOMY, lysis of adhesion;  Surgeon: Coralie Keens, MD;  Location: [facility];  Service: General;  Laterality: N/A;   LYSIS OF ADHESION  06/25/2016   Dr Nedra Hai     OB History   No obstetric history on file.     Family History  Problem Relation Age of Onset   Alcohol abuse Mother    Cancer Father 93       stomach cancer   Prostate cancer Father    Hypertension Maternal Grandfather    Diabetes Neg Hx    Heart disease Neg Hx     Social History   Tobacco Use   Smoking status: Every Day    Packs/day: 0.60    Years: 52.00    Pack years: 31.20    Types: Cigarettes   Smokeless tobacco: Never   Tobacco comments:    Pt smokes 5 cigarettes per day  Vaping Use   Vaping Use: Never used  Substance Use Topics   Alcohol use: Yes    Comment: rare use   Drug use: No    Home Medications Prior to Admission  medications   Medication Sig Start Date End Date Taking? Authorizing Provider  albuterol (VENTOLIN HFA) 108 (90 Base) MCG/ACT inhaler Inhale 2 puffs into the lungs every 4 (four) hours as needed for wheezing or shortness of breath. 10/07/20   Horton, Barbette Hair, MD  amLODipine (NORVASC) 10 MG tablet TAKE 1 TABLET(10 MG) BY MOUTH DAILY 03/16/20   Plotnikov, Evie Lacks, MD  azithromycin (ZITHROMAX Z-PAK) 250 MG tablet 500 mg PO Day 1 250 mg PO Day 2-5 03/16/21   Nyoka Cowden, Rowe Clack, PA-C  doxycycline (VIBRAMYCIN) 100 MG capsule Take 1 capsule (100 mg total) by mouth 2 (two) times daily. 10/07/20   Horton, Barbette Hair, MD  HYDROcodone-acetaminophen (NORCO/VICODIN) 5-325 MG tablet Take 1 tablet by mouth every 6 (six) hours  as needed. 09/22/20   Gregor Hams, MD  predniSONE (DELTASONE) 50 MG tablet Take 1 tablet (50 mg total) by mouth daily with breakfast. 03/16/21   Corena Herter, PA-C  triamcinolone cream (KENALOG) 0.1 % Apply 1 application topically 2 (two) times daily. 05/19/18   Lance Sell, NP  trifluoperazine (STELAZINE) 5 MG tablet Take 0.5 tablets by mouth every evening. Take 1/2 tablet to = 2.5 mg qd 06/04/16   [provider]    Allergies    Patient has no known allergies.  Review of Systems   Review of Systems  Constitutional:  Negative for fever.  Respiratory:  Positive for shortness of breath. Negative for cough.   Cardiovascular:  Negative for chest pain.  All other systems reviewed and are negative.  Physical Exam Updated Vital Signs There were no vitals taken for this visit.  Physical Exam Vitals and nursing note reviewed.  Constitutional:      General: She is not in acute distress.    Appearance: She is well-developed. She is not diaphoretic.  HENT:     Head: Normocephalic and atraumatic.  Cardiovascular:     Rate and Rhythm: Normal rate and regular rhythm.     Heart sounds: No murmur heard.   No friction rub. No gallop.  Pulmonary:     Effort: Pulmonary effort is normal. No tachypnea, accessory muscle usage or respiratory distress.     Breath sounds: Normal breath sounds. No wheezing.  Chest:     Chest wall: No tenderness.  Abdominal:     General: Bowel sounds are normal. There is no distension.     Palpations: Abdomen is soft.     Tenderness: There is no abdominal tenderness.  Musculoskeletal:        General: Normal range of motion.     Cervical back: Normal range of motion and neck supple.     Right lower leg: No tenderness. No edema.     Left lower leg: No tenderness. No edema.  Skin:    General: Skin is warm and dry.  Neurological:     General: No focal deficit present.     Mental Status: She is alert and oriented to person, place, and time.     ED Results / Procedures / Treatments   Labs (all labs ordered are listed, but only abnormal results are displayed) Labs Reviewed - No data to display  EKG EKG Interpretation  Date/Time:  Thursday April 09 2021 02:52:37 EDT Ventricular Rate:  68 PR Interval:  158 QRS Duration: 92 QT Interval:  379 QTC Calculation: 403 R Axis:   69 Text Interpretation: Sinus rhythm Atrial premature complex Biatrial enlargement RSR' in V1 or V2, right VCD or RVH Nonspecific repol  abnormality, diffuse leads Confirmed by Veryl Speak [phone]) on 04/09/2021 3:09:45 AM  Radiology No results found.  Procedures Procedures   Medications Ordered in ED Medications - No data to display  ED Course  I have reviewed the triage vital signs and the nursing notes.  Pertinent labs & imaging results that were available during my care of the patient were reviewed by me and considered in my medical decision making (see chart for details).    MDM Rules/Calculators/A&P  Patient presenting after an episode of shortness of breath that occurred at home.  Patient arrived here with stable oxygen saturations and feeling much improved.  Patient's work-up here shows no laboratory abnormality and chest x-ray is clear.  She has been saturating in the upper 90s on room air.  She was ambulated and saturations never fell below 94%.  At this point, nothing appears emergent.  Patient seems appropriate for discharge with as needed return.  Final Clinical Impression(s) / ED Diagnoses Final diagnoses:  None    Rx / DC Orders ED Discharge Orders     None        Veryl Speak, MD 04/09/21 [phone]

## 2021-04-09 NOTE — ED Notes (Signed)
Ambulated in room Spo2 remained at 94%-95%  RA. AVS reviewed. Patient has no concerns tonight.

## 2021-04-09 NOTE — Discharge Instructions (Addendum)
Continue medications as previously prescribed.  Return to the emergency department if you develop chest pain, worsening breathing, or other new and concerning symptoms.

## 2021-04-09 NOTE — ED Triage Notes (Signed)
Patient is here for SOB. She is sleeping and felt like she couldn't breath. EMS gave her 5 of albuterol. No complaints of pain.   Hx: hypertension.   96% RA

## 2021-05-22 ENCOUNTER — Inpatient Hospital Stay (HOSPITAL_COMMUNITY)
Admission: EM | Admit: 2021-05-22 | Discharge: 2021-06-01 | DRG: 177 | Disposition: A | Discharge status: Home / Self Care | Payer: Medicare Other | Attending: Family Medicine | Admitting: Family Medicine

## 2021-05-22 ENCOUNTER — Emergency Department (HOSPITAL_COMMUNITY): Payer: Medicare Other

## 2021-05-22 ENCOUNTER — Encounter (HOSPITAL_COMMUNITY): Payer: Self-pay | Admitting: Emergency Medicine

## 2021-05-22 DIAGNOSIS — R918 Other nonspecific abnormal finding of lung field: Secondary | ICD-10-CM | POA: Diagnosis not present

## 2021-05-22 DIAGNOSIS — R140 Abdominal distension (gaseous): Secondary | ICD-10-CM

## 2021-05-22 DIAGNOSIS — J439 Emphysema, unspecified: Secondary | ICD-10-CM | POA: Diagnosis present

## 2021-05-22 DIAGNOSIS — J9601 Acute respiratory failure with hypoxia: Secondary | ICD-10-CM

## 2021-05-22 DIAGNOSIS — Z7952 Long term (current) use of systemic steroids: Secondary | ICD-10-CM

## 2021-05-22 DIAGNOSIS — R0602 Shortness of breath: Secondary | ICD-10-CM | POA: Diagnosis not present

## 2021-05-22 DIAGNOSIS — Z7401 Bed confinement status: Secondary | ICD-10-CM | POA: Diagnosis not present

## 2021-05-22 DIAGNOSIS — R739 Hyperglycemia, unspecified: Secondary | ICD-10-CM | POA: Clinically undetermined

## 2021-05-22 DIAGNOSIS — J441 Chronic obstructive pulmonary disease with (acute) exacerbation: Secondary | ICD-10-CM

## 2021-05-22 DIAGNOSIS — K6389 Other specified diseases of intestine: Secondary | ICD-10-CM | POA: Diagnosis not present

## 2021-05-22 DIAGNOSIS — Z720 Tobacco use: Secondary | ICD-10-CM | POA: Diagnosis not present

## 2021-05-22 DIAGNOSIS — R0609 Other forms of dyspnea: Secondary | ICD-10-CM | POA: Diagnosis not present

## 2021-05-22 DIAGNOSIS — I10 Essential (primary) hypertension: Secondary | ICD-10-CM | POA: Diagnosis present

## 2021-05-22 DIAGNOSIS — Z5320 Procedure and treatment not carried out because of patient's decision for unspecified reasons: Secondary | ICD-10-CM | POA: Diagnosis present

## 2021-05-22 DIAGNOSIS — R911 Solitary pulmonary nodule: Secondary | ICD-10-CM | POA: Diagnosis not present

## 2021-05-22 DIAGNOSIS — F17210 Nicotine dependence, cigarettes, uncomplicated: Secondary | ICD-10-CM | POA: Diagnosis present

## 2021-05-22 DIAGNOSIS — Z8249 Family history of ischemic heart disease and other diseases of the circulatory system: Secondary | ICD-10-CM

## 2021-05-22 DIAGNOSIS — K56609 Unspecified intestinal obstruction, unspecified as to partial versus complete obstruction: Secondary | ICD-10-CM | POA: Diagnosis not present

## 2021-05-22 DIAGNOSIS — K567 Ileus, unspecified: Secondary | ICD-10-CM | POA: Diagnosis not present

## 2021-05-22 DIAGNOSIS — Z961 Presence of intraocular lens: Secondary | ICD-10-CM | POA: Diagnosis not present

## 2021-05-22 DIAGNOSIS — K5669 Other partial intestinal obstruction: Secondary | ICD-10-CM | POA: Diagnosis not present

## 2021-05-22 DIAGNOSIS — Z85828 Personal history of other malignant neoplasm of skin: Secondary | ICD-10-CM | POA: Diagnosis not present

## 2021-05-22 DIAGNOSIS — Z79899 Other long term (current) drug therapy: Secondary | ICD-10-CM | POA: Diagnosis not present

## 2021-05-22 DIAGNOSIS — J9811 Atelectasis: Secondary | ICD-10-CM | POA: Diagnosis not present

## 2021-05-22 DIAGNOSIS — I700 Atherosclerosis of aorta: Secondary | ICD-10-CM | POA: Diagnosis not present

## 2021-05-22 DIAGNOSIS — R7303 Prediabetes: Secondary | ICD-10-CM | POA: Diagnosis present

## 2021-05-22 DIAGNOSIS — R636 Underweight: Secondary | ICD-10-CM | POA: Diagnosis not present

## 2021-05-22 DIAGNOSIS — Z681 Body mass index (BMI) 19 or less, adult: Secondary | ICD-10-CM | POA: Diagnosis not present

## 2021-05-22 DIAGNOSIS — E871 Hypo-osmolality and hyponatremia: Secondary | ICD-10-CM | POA: Diagnosis not present

## 2021-05-22 DIAGNOSIS — Z9841 Cataract extraction status, right eye: Secondary | ICD-10-CM

## 2021-05-22 DIAGNOSIS — R0902 Hypoxemia: Secondary | ICD-10-CM | POA: Diagnosis not present

## 2021-05-22 DIAGNOSIS — N8110 Cystocele, unspecified: Secondary | ICD-10-CM | POA: Diagnosis not present

## 2021-05-22 DIAGNOSIS — T380X5A Adverse effect of glucocorticoids and synthetic analogues, initial encounter: Secondary | ICD-10-CM | POA: Diagnosis present

## 2021-05-22 DIAGNOSIS — F259 Schizoaffective disorder, unspecified: Secondary | ICD-10-CM | POA: Diagnosis present

## 2021-05-22 DIAGNOSIS — R062 Wheezing: Secondary | ICD-10-CM | POA: Diagnosis not present

## 2021-05-22 DIAGNOSIS — K5730 Diverticulosis of large intestine without perforation or abscess without bleeding: Secondary | ICD-10-CM | POA: Diagnosis not present

## 2021-05-22 DIAGNOSIS — K56600 Partial intestinal obstruction, unspecified as to cause: Secondary | ICD-10-CM | POA: Diagnosis not present

## 2021-05-22 DIAGNOSIS — U071 COVID-19: Principal | ICD-10-CM | POA: Diagnosis present

## 2021-05-22 DIAGNOSIS — R109 Unspecified abdominal pain: Secondary | ICD-10-CM | POA: Diagnosis not present

## 2021-05-22 DIAGNOSIS — J9690 Respiratory failure, unspecified, unspecified whether with hypoxia or hypercapnia: Secondary | ICD-10-CM | POA: Diagnosis not present

## 2021-05-22 DIAGNOSIS — K8020 Calculus of gallbladder without cholecystitis without obstruction: Secondary | ICD-10-CM | POA: Diagnosis not present

## 2021-05-22 DIAGNOSIS — Z0189 Encounter for other specified special examinations: Secondary | ICD-10-CM

## 2021-05-22 DIAGNOSIS — M1612 Unilateral primary osteoarthritis, left hip: Secondary | ICD-10-CM | POA: Diagnosis not present

## 2021-05-22 DIAGNOSIS — E876 Hypokalemia: Secondary | ICD-10-CM | POA: Diagnosis not present

## 2021-05-22 DIAGNOSIS — R059 Cough, unspecified: Secondary | ICD-10-CM | POA: Diagnosis not present

## 2021-05-22 HISTORY — DX: COVID-19: U07.1

## 2021-05-22 HISTORY — DX: Hyperglycemia, unspecified: R73.9

## 2021-05-22 LAB — PHOSPHORUS: Phosphorus: 2.7 mg/dL (ref 2.5–4.6)

## 2021-05-22 LAB — GLUCOSE, CAPILLARY: Glucose-Capillary: 160 mg/dL — ABNORMAL HIGH (ref 70–99)

## 2021-05-22 LAB — CBC WITH DIFFERENTIAL/PLATELET
Abs Immature Granulocytes: 0.01 10*3/uL (ref 0.00–0.07)
Basophils Absolute: 0 10*3/uL (ref 0.0–0.1)
Basophils Relative: 0 %
Eosinophils Absolute: 0 10*3/uL (ref 0.0–0.5)
Eosinophils Relative: 0 %
HCT: 41.7 % (ref 36.0–46.0)
Hemoglobin: 13.7 g/dL (ref 12.0–15.0)
Immature Granulocytes: 0 %
Lymphocytes Relative: 46 %
Lymphs Abs: 1.7 10*3/uL (ref 0.7–4.0)
MCH: 29.3 pg (ref 26.0–34.0)
MCHC: 32.9 g/dL (ref 30.0–36.0)
MCV: 89.3 fL (ref 80.0–100.0)
Monocytes Absolute: 0.5 10*3/uL (ref 0.1–1.0)
Monocytes Relative: 14 %
Neutro Abs: 1.5 10*3/uL — ABNORMAL LOW (ref 1.7–7.7)
Neutrophils Relative %: 40 %
Platelets: 188 10*3/uL (ref 150–400)
RBC: 4.67 MIL/uL (ref 3.87–5.11)
RDW: 14.3 % (ref 11.5–15.5)
WBC: 3.8 10*3/uL — ABNORMAL LOW (ref 4.0–10.5)
nRBC: 0 % (ref 0.0–0.2)

## 2021-05-22 LAB — D-DIMER, QUANTITATIVE: D-Dimer, Quant: <0.27 ug/mL-FEU (ref 0.00–0.50)

## 2021-05-22 LAB — BASIC METABOLIC PANEL
Anion gap: 14 (ref 5–15)
BUN: 17 mg/dL (ref 8–23)
CO2: 23 mmol/L (ref 22–32)
Calcium: 8.9 mg/dL (ref 8.9–10.3)
Chloride: 102 mmol/L (ref 98–111)
Creatinine, Ser: 0.65 mg/dL (ref 0.44–1.00)
GFR, Estimated: >60 mL/min (ref >60)
Glucose, Bld: 261 mg/dL — ABNORMAL HIGH (ref 70–99)
Potassium: 4.2 mmol/L (ref 3.5–5.1)
Sodium: 139 mmol/L (ref 135–145)

## 2021-05-22 LAB — I-STAT CHEM 8, ED
BUN: 15 mg/dL (ref 8–23)
Calcium, Ion: 1.08 mmol/L — ABNORMAL LOW (ref 1.15–1.40)
Chloride: 98 mmol/L (ref 98–111)
Creatinine, Ser: 0.5 mg/dL (ref 0.44–1.00)
Glucose, Bld: 124 mg/dL — ABNORMAL HIGH (ref 70–99)
HCT: 43 % (ref 36.0–46.0)
Hemoglobin: 14.6 g/dL (ref 12.0–15.0)
Potassium: 2.7 mmol/L — CL (ref 3.5–5.1)
Sodium: 137 mmol/L (ref 135–145)
TCO2: 26 mmol/L (ref 22–32)

## 2021-05-22 LAB — C-REACTIVE PROTEIN: CRP: 1.8 mg/dL — ABNORMAL HIGH (ref <1.0)

## 2021-05-22 LAB — PROCALCITONIN: Procalcitonin: <0.1 ng/mL

## 2021-05-22 LAB — RESP PANEL BY RT-PCR (FLU A&B, COVID) ARPGX2
Influenza A by PCR: NEGATIVE
Influenza B by PCR: NEGATIVE
SARS Coronavirus 2 by RT PCR: POSITIVE — AB

## 2021-05-22 LAB — HIV ANTIBODY (ROUTINE TESTING W REFLEX): HIV Screen 4th Generation wRfx: NONREACTIVE

## 2021-05-22 LAB — TROPONIN I (HIGH SENSITIVITY)
Troponin I (High Sensitivity): 5 ng/L (ref <18)
Troponin I (High Sensitivity): 5 ng/L (ref <18)

## 2021-05-22 LAB — BRAIN NATRIURETIC PEPTIDE: B Natriuretic Peptide: 27.5 pg/mL (ref 0.0–100.0)

## 2021-05-22 LAB — MAGNESIUM: Magnesium: 2.6 mg/dL — ABNORMAL HIGH (ref 1.7–2.4)

## 2021-05-22 MED ORDER — ONDANSETRON HCL 4 MG/2ML IJ SOLN
4.0000 mg | Freq: Four times a day (QID) | INTRAMUSCULAR | Status: DC | PRN
  Administered 2021-05-23 – 2021-05-29 (×7): 4 mg via INTRAVENOUS
  Filled 2021-05-22 (×7): qty 2

## 2021-05-22 MED ORDER — METHYLPREDNISOLONE SODIUM SUCC 125 MG IJ SOLR
1.0000 mg/kg | Freq: Two times a day (BID) | INTRAMUSCULAR | Status: DC
Start: 2021-05-22 — End: 2021-05-22

## 2021-05-22 MED ORDER — REMDESIVIR 100 MG IV SOLR
100.0000 mg | INTRAVENOUS | Status: AC
  Administered 2021-05-22 (×2): 100 mg via INTRAVENOUS
  Filled 2021-05-22 (×2): qty 20

## 2021-05-22 MED ORDER — LORATADINE 10 MG PO TABS
10.0000 mg | ORAL_TABLET | Freq: Every day | ORAL | Status: DC
  Administered 2021-05-22 – 2021-05-31 (×9): 10 mg via ORAL
  Filled 2021-05-22 (×10): qty 1

## 2021-05-22 MED ORDER — ALBUTEROL SULFATE (2.5 MG/3ML) 0.083% IN NEBU: 2.5000 mg | INHALATION_SOLUTION | RESPIRATORY_TRACT | Status: DC | PRN

## 2021-05-22 MED ORDER — METHYLPREDNISOLONE SODIUM SUCC 125 MG IJ SOLR
60.0000 mg | Freq: Two times a day (BID) | INTRAMUSCULAR | Status: DC
  Administered 2021-05-22 – 2021-05-26 (×10): 60 mg via INTRAVENOUS
  Filled 2021-05-22 (×10): qty 2

## 2021-05-22 MED ORDER — CEFTRIAXONE SODIUM 1 G IJ SOLR: 1.0000 g | Freq: Once | INTRAMUSCULAR | 0 refills | Status: DC

## 2021-05-22 MED ORDER — PANTOPRAZOLE SODIUM 40 MG PO TBEC
40.0000 mg | DELAYED_RELEASE_TABLET | Freq: Every day | ORAL | Status: DC
  Administered 2021-05-22 – 2021-05-24 (×3): 40 mg via ORAL
  Filled 2021-05-22 (×3): qty 1

## 2021-05-22 MED ORDER — FLUTICASONE PROPIONATE 50 MCG/ACT NA SUSP
2.0000 | Freq: Every day | NASAL | Status: DC
  Administered 2021-05-22 – 2021-06-01 (×11): 2 via NASAL
  Filled 2021-05-22: qty 16

## 2021-05-22 MED ORDER — NICOTINE 14 MG/24HR TD PT24
14.0000 mg | MEDICATED_PATCH | Freq: Every day | TRANSDERMAL | Status: DC
  Administered 2021-05-22 – 2021-06-01 (×11): 14 mg via TRANSDERMAL
  Filled 2021-05-22 (×11): qty 1

## 2021-05-22 MED ORDER — MOMETASONE FURO-FORMOTEROL FUM 200-5 MCG/ACT IN AERO
2.0000 | INHALATION_SPRAY | Freq: Two times a day (BID) | RESPIRATORY_TRACT | Status: DC
  Administered 2021-05-22 – 2021-06-01 (×21): 2 via RESPIRATORY_TRACT
  Filled 2021-05-22: qty 8.8

## 2021-05-22 MED ORDER — ENOXAPARIN SODIUM 30 MG/0.3ML IJ SOSY
30.0000 mg | PREFILLED_SYRINGE | INTRAMUSCULAR | Status: DC
  Administered 2021-05-22 – 2021-06-01 (×11): 30 mg via SUBCUTANEOUS
  Filled 2021-05-22 (×11): qty 0.3

## 2021-05-22 MED ORDER — POTASSIUM CHLORIDE CRYS ER 20 MEQ PO TBCR
80.0000 meq | EXTENDED_RELEASE_TABLET | Freq: Once | ORAL | Status: AC
  Administered 2021-05-22: 80 meq via ORAL
  Filled 2021-05-22: qty 4

## 2021-05-22 MED ORDER — ALBUTEROL SULFATE (2.5 MG/3ML) 0.083% IN NEBU
5.0000 mg | INHALATION_SOLUTION | Freq: Once | RESPIRATORY_TRACT | Status: AC
  Administered 2021-05-22: 5 mg via RESPIRATORY_TRACT
  Filled 2021-05-22: qty 6

## 2021-05-22 MED ORDER — UMECLIDINIUM BROMIDE 62.5 MCG/INH IN AEPB: 1.0000 | INHALATION_SPRAY | Freq: Every day | RESPIRATORY_TRACT | Status: DC

## 2021-05-22 MED ORDER — ACETAMINOPHEN 325 MG PO TABS
650.0000 mg | ORAL_TABLET | Freq: Four times a day (QID) | ORAL | Status: DC | PRN
Start: 2021-05-22 — End: 2021-06-01
  Administered 2021-05-22 – 2021-05-29 (×10): 650 mg via ORAL
  Filled 2021-05-22 (×10): qty 2

## 2021-05-22 MED ORDER — HYDROCOD POLST-CPM POLST ER 10-8 MG/5ML PO SUER
5.0000 mL | Freq: Two times a day (BID) | ORAL | Status: DC | PRN
  Administered 2021-05-26 – 2021-05-30 (×3): 5 mL via ORAL
  Filled 2021-05-22 (×3): qty 5

## 2021-05-22 MED ORDER — ONDANSETRON HCL 4 MG PO TABS
4.0000 mg | ORAL_TABLET | Freq: Four times a day (QID) | ORAL | Status: DC | PRN
  Administered 2021-05-28 – 2021-05-30 (×2): 4 mg via ORAL
  Filled 2021-05-22 (×3): qty 1

## 2021-05-22 MED ORDER — AEROCHAMBER Z-STAT PLUS/MEDIUM MISC: 1.0000 | Freq: Once | Status: AC

## 2021-05-22 MED ORDER — GUAIFENESIN ER 600 MG PO TB12
1200.0000 mg | ORAL_TABLET | Freq: Two times a day (BID) | ORAL | Status: DC
  Administered 2021-05-22 – 2021-06-01 (×20): 1200 mg via ORAL
  Filled 2021-05-22 (×21): qty 2

## 2021-05-22 MED ORDER — AEROCHAMBER Z-STAT PLUS/MEDIUM MISC
Status: AC
  Administered 2021-05-22: 1
  Filled 2021-05-22: qty 1

## 2021-05-22 MED ORDER — INSULIN ASPART 100 UNIT/ML IJ SOLN: 0.0000 [IU] | Freq: Every day | INTRAMUSCULAR | Status: DC

## 2021-05-22 MED ORDER — SODIUM CHLORIDE 0.9 % IV SOLN
100.0000 mg | Freq: Every day | INTRAVENOUS | Status: AC
  Administered 2021-05-23 – 2021-05-26 (×4): 100 mg via INTRAVENOUS
  Filled 2021-05-22 (×4): qty 20

## 2021-05-22 MED ORDER — IPRATROPIUM-ALBUTEROL 20-100 MCG/ACT IN AERS
1.0000 | INHALATION_SPRAY | Freq: Four times a day (QID) | RESPIRATORY_TRACT | Status: DC
  Administered 2021-05-22 – 2021-05-27 (×21): 1 via RESPIRATORY_TRACT
  Filled 2021-05-22: qty 4

## 2021-05-22 MED ORDER — PREDNISONE 50 MG PO TABS: 50.0000 mg | ORAL_TABLET | Freq: Every day | ORAL | Status: DC

## 2021-05-22 MED ORDER — ASCORBIC ACID 500 MG PO TABS
500.0000 mg | ORAL_TABLET | Freq: Every day | ORAL | Status: DC
  Administered 2021-05-22 – 2021-06-01 (×10): 500 mg via ORAL
  Filled 2021-05-22 (×11): qty 1

## 2021-05-22 MED ORDER — ALBUTEROL SULFATE HFA 108 (90 BASE) MCG/ACT IN AERS
6.0000 | INHALATION_SPRAY | Freq: Once | RESPIRATORY_TRACT | Status: AC
  Administered 2021-05-22: 6 via RESPIRATORY_TRACT
  Filled 2021-05-22: qty 6.7

## 2021-05-22 MED ORDER — ZINC SULFATE 220 (50 ZN) MG PO CAPS
220.0000 mg | ORAL_CAPSULE | Freq: Every day | ORAL | Status: DC
  Administered 2021-05-22 – 2021-06-01 (×10): 220 mg via ORAL
  Filled 2021-05-22 (×11): qty 1

## 2021-05-22 MED ORDER — INSULIN ASPART 100 UNIT/ML IJ SOLN
0.0000 [IU] | Freq: Three times a day (TID) | INTRAMUSCULAR | Status: DC
  Administered 2021-05-23 (×2): 2 [IU] via SUBCUTANEOUS
  Administered 2021-05-23: 1 [IU] via SUBCUTANEOUS
  Administered 2021-05-24: 2 [IU] via SUBCUTANEOUS
  Administered 2021-05-24: 1 [IU] via SUBCUTANEOUS

## 2021-05-22 MED ORDER — MAGNESIUM SULFATE 2 GM/50ML IV SOLN
2.0000 g | Freq: Once | INTRAVENOUS | Status: AC
  Administered 2021-05-22: 2 g via INTRAVENOUS
  Filled 2021-05-22: qty 50

## 2021-05-22 MED ORDER — GUAIFENESIN-DM 100-10 MG/5ML PO SYRP: 10.0000 mL | ORAL_SOLUTION | ORAL | Status: DC | PRN

## 2021-05-22 MED ORDER — ALBUTEROL SULFATE HFA 108 (90 BASE) MCG/ACT IN AERS
1.0000 | INHALATION_SPRAY | RESPIRATORY_TRACT | Status: DC | PRN
  Administered 2021-05-22 – 2021-06-01 (×7): 1 via RESPIRATORY_TRACT

## 2021-05-22 MED ORDER — SODIUM CHLORIDE 0.9 % IV SOLN
2.0000 g | Freq: Every day | INTRAVENOUS | Status: DC
  Administered 2021-05-22 – 2021-05-27 (×6): 2 g via INTRAVENOUS
  Filled 2021-05-22: qty 20
  Filled 2021-05-22: qty 2
  Filled 2021-05-22: qty 20
  Filled 2021-05-22: qty 2
  Filled 2021-05-22 (×3): qty 20

## 2021-05-22 NOTE — Evaluation (Signed)
Occupational Therapy Treatment Patient Details Name: Brenda Gibson MRN: [id number] DOB: 1941-07-11 Today's Date: 05/22/2021    History of present illness Patient is a 80 year old female who presented to the ED with shortness of breath. patient was found to have acute hypoxic respiratory failure, COVID 19, and COPD exacerbation. PMH:HTN, schizoaffective disorder, and COPD.   OT comments  Patient is a 80 year old female who admitted to the hospital with above diagnosis. Patient was noted to complete LB dressing tasks with Min bed level and sitting edge of bed in ED room with transfers with min guard  with no AD due to manipulation of leads and lines with O2 maintaining 90 % on RA. Evaluation was limited unable to assess patient with functional mobility at this time due to leads and lines in ED. Patient could benefit from continued OT services at this time to further assess functional mobility during ADL tasks and make appropriate AD/AE recommendations.   Follow Up Recommendations  No OT follow up    Equipment Recommendations  Other (comment) (will need to further determin needs during next session.)    Recommendations for Other Services      Precautions / Restrictions Precautions Precaution Comments: monitor O2 and bp Restrictions Weight Bearing Restrictions: No       Mobility Bed Mobility Overal bed mobility: Modified Independent                  Transfers Overall transfer level: Needs assistance               General transfer comment: patient was supervision for transfers with all lines and lead management. patient was noted to have O2 saturation maintain 90% on RA with transfer. unable to assess ambulatory transfers at this time.    Balance Overall balance assessment: Needs assistance   Sitting balance-Leahy Scale: Good       Standing balance-Leahy Scale: Fair Standing balance comment: no UE support standig at edge of bed                            ADL either performed or assessed with clinical judgement   ADL Overall ADL's : Needs assistance/impaired                                       General ADL Comments: patient was able to transfer to commode in room with min guard for line and lead management. patient was able to complete LB dressing tasks as well. patients blood pressure lying in bed was 123/72 mmhg, sitting on edge of bed 116/59 mmhg, standing 114/66 mmhg. patient denied dizziness at this time. evaluation was limited unable to see patient complete household distances with COVID 19 precautions and lead length in ED.     Vision   Vision Assessment?: No apparent visual deficits   Perception     Praxis      Cognition Arousal/Alertness: Awake/alert Behavior During Therapy: WFL for tasks assessed/performed Overall Cognitive Status: No family/caregiver present to determine baseline cognitive functioning                                 General Comments: oriented to self and hospital.        Exercises     Shoulder Instructions  General Comments      Pertinent Vitals/ Pain       Pain Assessment: No/denies pain  Home Living Family/patient expects to be discharged to:: Private residence Living Arrangements: Children (son)   Type of Home: House Home Access: Stairs to enter Technical brewer of Steps: 3 Entrance Stairs-Rails: Left Home Layout: One level     Bathroom Shower/Tub: Teacher, early years/pre: Standard         Additional Comments: patient reported having no AD but note in this system says patient as a walker and cane.      Prior Functioning/Environment Level of Independence: Independent            Frequency  Min 1X/week        Progress Toward Goals  OT Goals(current goals can now be found in the care plan section)     Acute Rehab OT Goals Patient Stated Goal: to go home OT Goal Formulation: With patient Time For Goal  Achievement: 05/29/21 Potential to Achieve Goals: Good  Plan      Co-evaluation                 AM-PAC OT "6 Clicks" Daily Activity     Outcome Measure   Help from another person eating meals?: None Help from another person taking care of personal grooming?: None Help from another person toileting, which includes using toliet, bedpan, or urinal?: None Help from another person bathing (including washing, rinsing, drying)?: A Little Help from another person to put on and taking off regular upper body clothing?: None Help from another person to put on and taking off regular lower body clothing?: None 6 Click Score: 23    End of Session    OT Visit Diagnosis: History of falling (Z91.81)   Activity Tolerance Patient tolerated treatment well   Patient Left in bed;with call bell/phone within reach   Nurse Communication          Time: 1424-1440 OT Time Calculation (min): 16 min  Charges: OT General Charges $OT Visit: 1 Visit OT Evaluation $OT Eval Low Complexity: 1 Low  Jackelyn Poling OTR/L, MS Acute Rehabilitation Department Office# [phone] Pager# [place] 05/22/2021, 3:14 PM

## 2021-05-22 NOTE — H&P (Signed)
History and Physical    [name] [id number] DOB: 1941-10-29 DOA: 05/22/2021  PCP: Lance Sell, NP  Patient coming from: Home  I have personally briefly reviewed patient's old medical records in [place]  Chief Complaint: SOB  HPI: Brenda Gibson is a 80 y.o. female with medical history significant of HTN, schizoaffective disorder, COPD, ongoing tobacco use.  Pt presents to ED with c/o SOB.  SOB not alleviated by home nebs.  Symptoms onset this past day.  Worsening, constant.  Associated wheezing.  No abd pain, fever, CP, sore throat.  Has dry cough.   ED Course: CXR neg.  Given solumedrol with EMS en route.  SOB persists despite multiple neb treatments  Satting mid to upper 80s on RA.  Pt found to be COVID+   Review of Systems: As per HPI, otherwise all review of systems negative.  Past Medical History:  Diagnosis Date   Basal cell carcinoma of ala nasi    Cataract    Hypertension    Knee pain, bilateral    Measles    Myalgia    Seborrheic dermatitis of scalp    Varicella     Past Surgical History:  Procedure Laterality Date   BASAL CELL CARCINOMA EXCISION     '11 ([place])   CATARACT EXTRACTION W/ INTRAOCULAR LENS IMPLANT     right eye. '09 (Dr. Dolores Lory)   LAPAROTOMY N/A 06/25/2016   Procedure: EXPLORATORY LAPAROTOMY, lysis of adhesion;  Surgeon: Coralie Keens, MD;  Location: [facility];  Service: General;  Laterality: N/A;   LYSIS OF ADHESION  06/25/2016   Dr Brenda Gibson     reports that she has been smoking cigarettes. She has a 31.20 pack-year smoking history. She has never used smokeless tobacco. She reports current alcohol use. She reports that she does not use drugs.  No Known Allergies  Family History  Problem Relation Age of Onset   Alcohol abuse Mother    Cancer Father 56       stomach cancer   Prostate cancer Father    Hypertension Maternal Grandfather    Diabetes Neg Hx    Heart disease Neg Hx      Prior to  Admission medications   Medication Sig Start Date End Date Taking? Authorizing Provider  cefTRIAXone (ROCEPHIN) 1 g injection Inject 1 g into the muscle once for 1 dose. 05/22/21 05/22/21 Yes Palumbo, April, MD  albuterol (VENTOLIN HFA) 108 (90 Base) MCG/ACT inhaler Inhale 2 puffs into the lungs every 4 (four) hours as needed for wheezing or shortness of breath. 10/07/20   Horton, Barbette Hair, MD  amLODipine (NORVASC) 10 MG tablet TAKE 1 TABLET(10 MG) BY MOUTH DAILY 03/16/20   Plotnikov, Evie Lacks, MD  azithromycin (ZITHROMAX Z-PAK) 250 MG tablet 500 mg PO Day 1 250 mg PO Day 2-5 03/16/21   Nyoka Cowden, Rowe Clack, PA-C  doxycycline (VIBRAMYCIN) 100 MG capsule Take 1 capsule (100 mg total) by mouth 2 (two) times daily. 10/07/20   Horton, Barbette Hair, MD  HYDROcodone-acetaminophen (NORCO/VICODIN) 5-325 MG tablet Take 1 tablet by mouth every 6 (six) hours as needed. 09/22/20   Gregor Hams, MD  predniSONE (DELTASONE) 50 MG tablet Take 1 tablet (50 mg total) by mouth daily with breakfast. 03/16/21   Corena Herter, PA-C  triamcinolone cream (KENALOG) 0.1 % Apply 1 application topically 2 (two) times daily. 05/19/18   Lance Sell, NP  trifluoperazine (STELAZINE) 5 MG tablet Take 0.5 tablets by mouth every  evening. Take 1/2 tablet to = 2.5 mg qd 06/04/16   [provider]    Physical Exam: Vitals:   05/22/21 0207 05/22/21 0315 05/22/21 0330 05/22/21 0345  BP: 125/62 (!) 92/53 (!) 104/58 103/62  Pulse: 79 70 71 71  Resp: '18 18 20 '$ (!) 21  Temp: 98.7 F (37.1 C)     TempSrc: Oral     SpO2: 98% 100% 90% 94%  Weight: 45.4 kg     Height: '5\' 5"'$  (1.651 m)       Constitutional: Ill appearing Eyes: PERRL, lids and conjunctivae normal ENMT: Mucous membranes are moist. Posterior pharynx clear of any exudate or lesions.Normal dentition.  Neck: normal, supple, no masses, no thyromegaly Respiratory: Prolonged expiration, wheezing Cardiovascular: Regular rate and rhythm, no murmurs / rubs / gallops. No  extremity edema. 2+ pedal pulses. No carotid bruits.  Abdomen: no tenderness, no masses palpated. No hepatosplenomegaly. Bowel sounds positive.  Musculoskeletal: no clubbing / cyanosis. No joint deformity upper and lower extremities. Good ROM, no contractures. Normal muscle tone.  Skin: no rashes, lesions, ulcers. No induration Neurologic: CN 2-12 grossly intact. Sensation intact, DTR normal. Strength 5/5 in all 4.  Psychiatric: Normal judgment and insight. Alert and oriented x 3. Normal mood.    Labs on Admission: I have personally reviewed following labs and imaging studies  CBC: Recent Labs  Lab 05/22/21 0220 05/22/21 0232  WBC 3.8*  --   NEUTROABS 1.5*  --   HGB 13.7 14.6  HCT 41.7 43.0  MCV 89.3  --   PLT 188  --    Basic Metabolic Panel: Recent Labs  Lab 05/22/21 0232  NA 137  K 2.7*  CL 98  GLUCOSE 124*  BUN 15  CREATININE 0.50   GFR: Estimated Creatinine Clearance: 41.5 mL/min (by C-G formula based on SCr of 0.5 mg/dL). Liver Function Tests: No results for input(s): AST, ALT, ALKPHOS, BILITOT, PROT, ALBUMIN in the last 168 hours. No results for input(s): LIPASE, AMYLASE in the last 168 hours. No results for input(s): AMMONIA in the last 168 hours. Coagulation Profile: No results for input(s): INR, PROTIME in the last 168 hours. Cardiac Enzymes: No results for input(s): CKTOTAL, CKMB, CKMBINDEX, TROPONINI in the last 168 hours. BNP (last 3 results) No results for input(s): PROBNP in the last 8760 hours. HbA1C: No results for input(s): HGBA1C in the last 72 hours. CBG: No results for input(s): GLUCAP in the last 168 hours. Lipid Profile: No results for input(s): CHOL, HDL, LDLCALC, TRIG, CHOLHDL, LDLDIRECT in the last 72 hours. Thyroid Function Tests: No results for input(s): TSH, T4TOTAL, FREET4, T3FREE, THYROIDAB in the last 72 hours. Anemia Panel: No results for input(s): VITAMINB12, FOLATE, FERRITIN, TIBC, IRON, RETICCTPCT in the last 72 hours. Urine  analysis:    Component Value Date/Time   COLORURINE YELLOW 06/19/2016 [place] 06/19/2016 [place] 06/19/2016 [place] 06/19/2016 0854   LABSPEC 1.042 (H) 06/19/2016 0854   LABSPEC 1.042 (H) 06/19/2016 0854   PHURINE 6.5 06/19/2016 0854   PHURINE 6.5 06/19/2016 0854   GLUCOSEU NEGATIVE 06/19/2016 0854   GLUCOSEU NEGATIVE 06/19/2016 0854   HGBUR NEGATIVE 06/19/2016 0854   HGBUR NEGATIVE 06/19/2016 [place] NEGATIVE 06/19/2016 0854   BILIRUBINUR NEGATIVE 06/19/2016 0854   BILIRUBINUR neg 07/08/2015 1136   [place] 06/19/2016 0854   [place] 06/19/2016 0854   PROTEINUR NEGATIVE 06/19/2016 0854   PROTEINUR NEGATIVE 06/19/2016 0854   UROBILINOGEN  0.2 07/08/2015 1136   NITRITE NEGATIVE 06/19/2016 0854   NITRITE NEGATIVE 06/19/2016 0854   LEUKOCYTESUR MODERATE (A) 06/19/2016 0854   LEUKOCYTESUR MODERATE (A) 06/19/2016 0854    Radiological Exams on Admission: DG Chest 1 View  Result Date: 05/22/2021 CLINICAL DATA:  Shortness of breath EXAM: CHEST  1 VIEW COMPARISON:  04/09/2021 FINDINGS: hyperinflation with probable emphysema. No focal opacity or pleural effusion. Normal heart size. No pneumothorax. Chronic right-sided rib fractures. IMPRESSION: No active disease.  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   On: 05/22/2021 02:50    EKG: Independently reviewed.  Assessment/Plan Principal Problem:   Acute hypoxemic respiratory failure due to COVID-19 [facility]) Active Problems:   Essential hypertension, benign   COPD with acute exacerbation (HCC)    Acute hypoxic resp failure due to COPD exacerbation + COVID-19 Cont pulse ox See below Covid-19 - COVID pathway Remdesivir Steroids CRP, d.dimer, procalcitonin pending Daily labs Tele monitor COPD exacerbation - Treatment for COVID as above COPD pathway PRN albuterol Systemic steroids Scheduled INH  steroid, LABA, LAMA Nicotine patch HTN - Holding home BP meds due to soft BPs in ED  DVT prophylaxis: Lovenox Code Status: Full Family Communication: No family in room Disposition Plan: Home after O2 requirement improved Consults called: None Admission status: Admit to inpatient  Severity of Illness: The appropriate patient status for this patient is INPATIENT. Inpatient status is judged to be reasonable and necessary in order to provide the required intensity of service to ensure the patient's safety. The patient's presenting symptoms, physical exam findings, and initial radiographic and laboratory data in the context of their chronic comorbidities is felt to place them at high risk for further clinical deterioration. Furthermore, it is not anticipated that the patient will be medically stable for discharge from the hospital within 2 midnights of admission. The following factors support the patient status of inpatient.   IP status due to new O2 requirement associated with COVID-19 virus infection.   * I certify that at the point of admission it is my clinical judgment that the patient will require inpatient hospital care spanning beyond 2 midnights from the point of admission due to high intensity of service, high risk for further deterioration and high frequency of surveillance required.*   Adiel Mcnamara M. DO [facility]  How to contact the [facility] Attending or Consulting provider [place] or covering provider during after hours [place], for this patient?  Check the care team in [facility] and look for a) attending/consulting TR[facility] provider listed and b) the [facility] team listed Log into [email or web address]  [employer] Physician Scheduling and messaging for groups and whole hospitals  On call and physician scheduling software for group practices, residents, hospitalists and other medical providers for call, clinic, rotation and shift schedules. On[employer] is a hospital-wide system for scheduling  doctors and paging doctors on call. EasyPlot is for scientific plotting and data analysis.  [email or web address]  and use [facility]'s universal password to access. If you do not have the password, please contact the hospital operator.  Locate the [facility] provider you are looking for under [facility] and page to a number that you can be directly reached. If you still have difficulty reaching the provider, please page the [facility] (Director on Call) for the Hospitalists listed on amion for assistance.  05/22/2021, 5:00 AM

## 2021-05-22 NOTE — ED Provider Notes (Signed)
[facility] DEPT Provider Note   CSN: [id number] Arrival date & time: 05/22/21  0154     History No chief complaint on file.   Brenda Gibson is a 80 y.o. female.  The history is provided by the patient.  Shortness of Breath Severity:  Moderate Onset quality:  Gradual Duration:  1 day Timing:  Constant Progression:  Worsening Chronicity:  Recurrent Context: not URI and not weather changes   Relieved by:  Nothing Worsened by:  Nothing Ineffective treatments:  Inhaler Associated symptoms: wheezing   Associated symptoms: no abdominal pain, no chest pain, no diaphoresis, no fever, no rash, no sore throat, no sputum production and no vomiting   Risk factors: no oral contraceptive use   Risk factors comment:  Current smoker Patient with COPD and ongoing tobacco use presents with SOB.  No f/c/r. Home medications not working.  Given nebs and solumedrol en route.      Past Medical History:  Diagnosis Date   Basal cell carcinoma of ala nasi    Cataract    Hypertension    Knee pain, bilateral    Measles    Myalgia    Seborrheic dermatitis of scalp    Varicella     Patient Active Problem List   Diagnosis Date Noted   Closed fracture of left tibial plateau 08/14/2020   Physical deconditioning 07/30/2016   Small bowel obstruction (HCC)    Hypokalemia 07/04/2016   Hypomagnesemia 07/04/2016   Urinary retention 07/04/2016   Ileus, postoperative ([place]) 07/02/2016   Malnutrition of moderate degree 06/28/2016   SBO (small bowel obstruction) s/p lysis of adhesion 06/25/2016 06/21/2016   Enteritis 06/21/2016   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   Elevated blood pressure reading without diagnosis of hypertension 06/19/2016   Medicare annual wellness visit, subsequent 02/23/2016   Female bladder prolapse 09/03/2014   Routine general medical examination at a health care facility 08/12/2014   Osteoporosis 08/12/2014   Overactive bladder 08/02/2013    Essential hypertension, benign 06/05/2013   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 '09 (Dr. Dolores Lory)   LAPAROTOMY N/A 06/25/2016   Procedure: EXPLORATORY LAPAROTOMY, lysis of adhesion;  Surgeon: Coralie Keens, MD;  Location: [facility];  Service: General;  Laterality: N/A;   LYSIS OF ADHESION  06/25/2016   Dr Nedra Hai     OB History   No obstetric history on file.     Family History  Problem Relation Age of Onset   Alcohol abuse Mother    Cancer Father 94       stomach cancer   Prostate cancer Father    Hypertension Maternal Grandfather    Diabetes Neg Hx    Heart disease Neg Hx     Social History   Tobacco Use   Smoking status: Every Day    Packs/day: 0.60    Years: 52.00    Pack years: 31.20    Types: Cigarettes   Smokeless tobacco: Never   Tobacco comments:    Pt smokes 5 cigarettes per day  Vaping Use   Vaping Use: Never used  Substance Use Topics   Alcohol use: Yes    Comment: rare use   Drug use: No    Home Medications Prior  to Admission medications   Medication Sig Start Date End Date Taking? Authorizing Provider  albuterol (VENTOLIN HFA) 108 (90 Base) MCG/ACT inhaler Inhale 2 puffs into the lungs every 4 (four) hours as needed for wheezing or shortness of breath. 10/07/20   Horton, Barbette Hair, MD  amLODipine (NORVASC) 10 MG tablet TAKE 1 TABLET(10 MG) BY MOUTH DAILY 03/16/20   Plotnikov, Evie Lacks, MD  azithromycin (ZITHROMAX Z-PAK) 250 MG tablet 500 mg PO Day 1 250 mg PO Day 2-5 03/16/21   Nyoka Cowden, Rowe Clack, PA-C  doxycycline (VIBRAMYCIN) 100 MG capsule Take 1 capsule (100 mg total) by mouth 2 (two) times daily. 10/07/20   Horton, Barbette Hair, MD  HYDROcodone-acetaminophen (NORCO/VICODIN) 5-325 MG tablet Take 1 tablet by mouth every 6  (six) hours as needed. 09/22/20   Gregor Hams, MD  predniSONE (DELTASONE) 50 MG tablet Take 1 tablet (50 mg total) by mouth daily with breakfast. 03/16/21   Corena Herter, PA-C  triamcinolone cream (KENALOG) 0.1 % Apply 1 application topically 2 (two) times daily. 05/19/18   Lance Sell, NP  trifluoperazine (STELAZINE) 5 MG tablet Take 0.5 tablets by mouth every evening. Take 1/2 tablet to = 2.5 mg qd 06/04/16   [provider]    Allergies    Patient has no known allergies.  Review of Systems   Review of Systems  Constitutional:  Negative for diaphoresis and fever.  HENT:  Negative for sore throat.   Eyes:  Negative for redness.  Respiratory:  Positive for shortness of breath and wheezing. Negative for sputum production.   Cardiovascular:  Negative for chest pain, palpitations and leg swelling.  Gastrointestinal:  Negative for abdominal pain and vomiting.  Genitourinary:  Negative for difficulty urinating.  Musculoskeletal:  Negative for arthralgias.  Skin:  Negative for rash.  Neurological:  Negative for facial asymmetry.  Psychiatric/Behavioral:  Negative for agitation.   All other systems reviewed and are negative.  Physical Exam Updated Vital Signs BP 125/62 (BP Location: Right Arm)   Pulse 79   Temp 98.7 F (37.1 C) (Oral)   Resp 18   Ht '5\' 5"'$  (1.651 m)   Wt 45.4 kg   SpO2 98%   BMI 16.64 kg/m   Physical Exam Vitals and nursing note reviewed. Exam conducted with a chaperone present.  Constitutional:      General: She is not in acute distress.    Appearance: Normal appearance.  HENT:     Head: Normocephalic and atraumatic.     Nose: Nose normal.  Eyes:     Conjunctiva/sclera: Conjunctivae normal.     Pupils: Pupils are equal, round, and reactive to light.  Cardiovascular:     Rate and Rhythm: Normal rate and regular rhythm.     Pulses: Normal pulses.     Heart sounds: Normal heart sounds.  Pulmonary:     Effort: Prolonged expiration  present.     Breath sounds: Decreased air movement present. Wheezing present. No rales.  Abdominal:     General: Abdomen is flat. Bowel sounds are normal.     Palpations: Abdomen is soft.     Tenderness: There is no abdominal tenderness. There is no guarding.  Musculoskeletal:        General: Normal range of motion.     Right lower leg: No edema.     Left lower leg: No edema.  Skin:    General: Skin is warm and dry.     Capillary Refill: Capillary refill takes less  than 2 seconds.  Neurological:     General: No focal deficit present.     Mental Status: She is alert and oriented to person, place, and time.     Deep Tendon Reflexes: Reflexes normal.  Psychiatric:        Mood and Affect: Mood normal.        Behavior: Behavior normal.    ED Results / Procedures / Treatments   Labs (all labs ordered are listed, but only abnormal results are displayed) Results for orders placed or performed during the hospital encounter of 05/22/21  CBC with Differential/Platelet  Result Value Ref Range   WBC 3.8 (L) 4.0 - 10.5 K/uL   RBC 4.67 3.87 - 5.11 MIL/uL   Hemoglobin 13.7 12.0 - 15.0 g/dL   HCT 41.7 36.0 - 46.0 %   MCV 89.3 80.0 - 100.0 fL   MCH 29.3 26.0 - 34.0 pg   MCHC 32.9 30.0 - 36.0 g/dL   RDW 14.3 11.5 - 15.5 %   Platelets 188 150 - 400 K/uL   nRBC 0.0 0.0 - 0.2 %   Neutrophils Relative % 40 %   Neutro Abs 1.5 (L) 1.7 - 7.7 K/uL   Lymphocytes Relative 46 %   Lymphs Abs 1.7 0.7 - 4.0 K/uL   Monocytes Relative 14 %   Monocytes Absolute 0.5 0.1 - 1.0 K/uL   Eosinophils Relative 0 %   Eosinophils Absolute 0.0 0.0 - 0.5 K/uL   Basophils Relative 0 %   Basophils Absolute 0.0 0.0 - 0.1 K/uL   Immature Granulocytes 0 %   Abs Immature Granulocytes 0.01 0.00 - 0.07 K/uL  I-stat chem 8, ED (not at [facility] or [facility])  Result Value Ref Range   Sodium 137 135 - 145 mmol/L   Potassium 2.7 (LL) 3.5 - 5.1 mmol/L   Chloride 98 98 - 111 mmol/L   BUN 15 8 - 23 mg/dL   Creatinine, Ser 0.50 0.44  - 1.00 mg/dL   Glucose, Bld 124 (H) 70 - 99 mg/dL   Calcium, Ion 1.08 (L) 1.15 - 1.40 mmol/L   TCO2 26 22 - 32 mmol/L   Hemoglobin 14.6 12.0 - 15.0 g/dL   HCT 43.0 36.0 - 46.0 %   Comment NOTIFIED PHYSICIAN    DG Chest 1 View  Result Date: 05/22/2021 CLINICAL DATA:  Shortness of breath EXAM: CHEST  1 VIEW COMPARISON:  04/09/2021 FINDINGS: hyperinflation with probable emphysema. No focal opacity or pleural effusion. Normal heart size. No pneumothorax. Chronic right-sided rib fractures. IMPRESSION: No active disease.  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   On: 05/22/2021 02:50     EKG EKG Interpretation  Date/Time:  Friday May 22 2021 02:06:58 EDT Ventricular Rate:  78 PR Interval:  154 QRS Duration: 93 QT Interval:  380 QTC Calculation: 433 R Axis:   102 Text Interpretation: Sinus rhythm Right atrial enlargement Probable right ventricular hypertrophy Confirmed by Dory Horn) on 05/22/2021 3:04:35 AM  Radiology DG Chest 1 View  Result Date: 05/22/2021 CLINICAL DATA:  Shortness of breath EXAM: CHEST  1 VIEW COMPARISON:  04/09/2021 FINDINGS: hyperinflation with probable emphysema. No focal opacity or pleural effusion. Normal heart size. No pneumothorax. Chronic right-sided rib fractures. IMPRESSION: No active disease.  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   On: 05/22/2021 02:50    Procedures Procedures   Medications Ordered in ED Medications  potassium chloride SA (KLOR-CON) CR  tablet 80 mEq (has no administration in time range)  albuterol (VENTOLIN HFA) 108 (90 Base) MCG/ACT inhaler 6 puff (has no administration in time range)  aerochamber Z-Stat Plus/medium 1 each (has no administration in time range)  magnesium sulfate IVPB 2 g 50 mL (2 g Intravenous New Bag/Given 05/22/21 0328)  albuterol (PROVENTIL) (2.5 MG/3ML) 0.083% nebulizer solution 5 mg (has no administration in  time range)    ED Course  I have reviewed the triage vital signs and the nursing notes.  Pertinent labs & imaging results that were available during my care of the patient were reviewed by me and considered in my medical decision making (see chart for details).  Brenda Gibson was evaluated in Emergency Department on 05/22/2021 for the symptoms described in the history of present illness. She was evaluated in the context of the global COVID-19 pandemic, which necessitated consideration that the patient might be at risk for infection with the SARS-CoV-2 virus that causes COVID-19. Institutional protocols and algorithms that pertain to the evaluation of patients at risk for COVID-19 are in a state of rapid change based on information released by regulatory bodies including the CDC and federal and state organizations. These policies and algorithms were followed during the patient's care in the ED.  Final Clinical Impression(s) / ED Diagnoses Admit to medicine for COPD exacerbation with hypoxia.       Shakaria Raphael, MD 05/22/21 [phone]

## 2021-05-22 NOTE — Progress Notes (Signed)
Pt instructed on use of Flutter Valve.  Pt demonstrated with good effort and technique.  

## 2021-05-22 NOTE — Progress Notes (Signed)
Brenda Gibson  Code Status: FULL Brenda Gibson is a 80 y.o. female patient admitted from ED awake, alert - oriented X4 - no acute distress noted. VSS -  no c/o shortness of breath, no c/o chest pain. Cardiac tele in place. Orientation to room and floor completed with information packet given to patient. Call light within reach, patient able to voice, and demonstrate understanding. Skin, clean-dry- intact without evidence of bruising, or skin tears.  No evidence of skin break down noted on exam.  ?  Will cont to eval and treat per MD orders.  Melonie Florida, RN  05/22/2021

## 2021-05-22 NOTE — ED Triage Notes (Signed)
Pt arrives from home via EMS. Complains of SOB, worsening over the last hour. HX Asthma. Smoker. Pt used inhaler at home without relief.

## 2021-05-22 NOTE — Progress Notes (Signed)
PROGRESS NOTE    Brenda Gibson  CNB:[id number] DOB: 1941/06/18 DOA: 05/22/2021 PCP: Evaristo Bury, NP    No chief complaint on file.   Brief Narrative:  Patient is 80 year old female history of hypertension, schizoaffective disorder, COPD with ongoing tobacco use presented to the ED with shortness of breath with no improvement on home nebs with associated wheezing.  Chest x-ray done negative.  Patient noted to be hypoxic with sats in the upper 80s on room air on presentation.  COVID-19 PCR positive.  Patient admitted for acute COPD exacerbation   Assessment & Plan:   Principal Problem:   Acute hypoxemic respiratory failure due to COVID-19 [facility]) Active Problems:   COPD with acute exacerbation (HCC)   Tobacco abuse   Essential hypertension, benign   Hypokalemia   COVID-19 virus infection   Hyperglycemia  #1 acute hypoxemic respiratory failure secondary to acute COPD exacerbation and COVID-19 infection. -Place on Solu-Medrol 60 mg IV every 12 hours, Flonase, Mucinex, Claritin, PPI, scheduled Combivent, Dulera, vitamin C, zinc, IV Rocephin. -Supportive care.  2.  COVID-19 infection -Patient presented worsening shortness of breath, noted to be hypoxic. -Chest x-ray negative for any acute infiltrate. -Placed on scheduled Combivent, Dulera, vitamin C, zinc, Mucinex, IV Solu-Medrol. -Follow daily CRP, D-dimer, ferritin, c-Met, CBC.  3.  Acute COPD exacerbation -Likely triggered by COVID-19 infection. -Placed on Solu-Medrol 60 mg IV every 12 hours, scheduled Combivent, Dulera, Mucinex, Claritin, PPI, IV Rocephin. -Supportive care. -Tobacco cessation stressed to patient. -Nicotine patch.  4.  Hypertension- -Blood pressure noted to be borderline/soft on admission and/antihypertensive medications on hold. -Follow  5.  Hypokalemia -Check a magnesium level. -Repleted. -Repeat labs in the morning.  6.  Hyperglycemia -Likely steroid-induced. -Check CBGs, hemoglobin  A1c -SSI.  7.  Tobacco abuse -Tobacco cessation -Nicotine patch.   DVT prophylaxis: Lovenox Code Status: Full Family Communication: Updated patient.  No family at bedside. Disposition:   Status is: Inpatient  Remains inpatient appropriate because:IV treatments appropriate due to intensity of illness or inability to take PO  Dispo: The patient is from: Home              Anticipated d/c is to: Home              Patient currently is not medically stable to d/c.   Difficult to place patient No       Consultants:  None  Procedures:  Chest x-ray 05/22/2021  Antimicrobials:  IV Rocephin 05/22/2021>>>>   Subjective: Sitting up on gurney.  Feeling a little bit better.  No chest pain.  No abdominal pain.  Objective: Vitals:   05/22/21 1600 05/22/21 1603 05/22/21 1630 05/22/21 1702  BP: 118/67  128/69 (!) 148/78  Pulse: 71  70 72  Resp: 15  16 20   Temp:  98.2 F (36.8 C)  (!) 97.4 F (36.3 C)  TempSrc:  Oral  Oral  SpO2: 90%  90% 91%  Weight:      Height:       No intake or output data in the 24 hours ending 05/22/21 2109 Filed Weights   05/22/21 0207  Weight: 45.4 kg    Examination:  General exam: Disheveled. Respiratory system: Diffuse coarse breath sounds with some expiratory wheezing.  No crackles noted.  Fair air movement. Cardiovascular system: S1 & S2 heard, RRR. No JVD, murmurs, rubs, gallops or clicks. No pedal edema. Gastrointestinal system: Abdomen is nondistended, soft and nontender. No organomegaly or masses felt. Normal bowel sounds heard. Central nervous  system: Alert and oriented. No focal neurological deficits. Extremities: Symmetric 5 x 5 power. Skin: No rashes, lesions or ulcers Psychiatry: Judgement and insight appear normal. Mood & affect appropriate.     Data Reviewed: I have personally reviewed following labs and imaging studies  CBC: Recent Labs  Lab 05/22/21 0220 05/22/21 0232  WBC 3.8*  --   NEUTROABS 1.5*  --   HGB 13.7  14.6  HCT 41.7 43.0  MCV 89.3  --   PLT 188  --     Basic Metabolic Panel: Recent Labs  Lab 05/22/21 0232 05/22/21 1050  NA 137 139  K 2.7* 4.2  CL 98 102  CO2  --  23  GLUCOSE 124* 261*  BUN 15 17  CREATININE 0.50 0.65  CALCIUM  --  8.9  MG  --  2.6*  PHOS  --  2.7    GFR: Estimated Creatinine Clearance: 41.5 mL/min (by C-G formula based on SCr of 0.65 mg/dL).  Liver Function Tests: No results for input(s): AST, ALT, ALKPHOS, BILITOT, PROT, ALBUMIN in the last 168 hours.  CBG: No results for input(s): GLUCAP in the last 168 hours.   Recent Results (from the past 240 hour(s))  Resp Panel by RT-PCR (Flu A&B, Covid) Nasopharyngeal Swab     Status: Abnormal   Collection Time: 05/22/21  2:09 AM   Specimen: Nasopharyngeal Swab; Nasopharyngeal(NP) swabs in vial transport medium  Result Value Ref Range Status   SARS Coronavirus 2 by RT PCR POSITIVE (A) NEGATIVE Final    Comment: RESULT CALLED TO, READ BACK BY AND VERIFIED WITH: Leonides Sake, RN @ 0430 ON 05/22/21 C VARNER (NOTE) SARS-CoV-2 target nucleic acids are DETECTED.  The SARS-CoV-2 RNA is generally detectable in upper respiratory specimens during the acute phase of infection. Positive results are indicative of the presence of the identified virus, but do not rule out bacterial infection or co-infection with other pathogens not detected by the test. Clinical correlation with patient history and other diagnostic information is necessary to determine patient infection status. The expected result is Negative.  Fact Sheet for Patients: EntrepreneurPulse.com.au  Fact Sheet for Healthcare Providers: IncredibleEmployment.be  This test is not yet approved or cleared by the [place] FDA and  has been authorized for detection and/or diagnosis of SARS-CoV-2 by FDA under an Emergency Use Authorization (EUA).  This EUA will remain in effect (meaning this test can  be used) for the  duration of  the COVID-19 declaration under Section 564(b)(1) of the Act, 21 U.S.C. section 360bbb-3(b)(1), unless the authorization is terminated or revoked sooner.     Influenza A by PCR NEGATIVE NEGATIVE Final   Influenza B by PCR NEGATIVE NEGATIVE Final    Comment: (NOTE) The Xpert Xpress SARS-CoV-2/FLU/RSV plus assay is intended as an aid in the diagnosis of influenza from Nasopharyngeal swab specimens and should not be used as a sole basis for treatment. Nasal washings and aspirates are unacceptable for Xpert Xpress SARS-CoV-2/FLU/RSV testing.  Fact Sheet for Patients: EntrepreneurPulse.com.au  Fact Sheet for Healthcare Providers: IncredibleEmployment.be  This test is not yet approved or cleared by the [place] FDA and has been authorized for detection and/or diagnosis of SARS-CoV-2 by FDA under an Emergency Use Authorization (EUA). This EUA will remain in effect (meaning this test can be used) for the duration of the COVID-19 declaration under Section 564(b)(1) of the Act, 21 U.S.C. section 360bbb-3(b)(1), unless the authorization is terminated or revoked.  Performed at [facility], [address] [place], [place] [address]          Radiology Studies: DG Chest 1 View  Result Date: 05/22/2021 CLINICAL DATA:  Shortness of breath EXAM: CHEST  1 VIEW COMPARISON:  04/09/2021 FINDINGS: hyperinflation with probable emphysema. No focal opacity or pleural effusion. Normal heart size. No pneumothorax. Chronic right-sided rib fractures. IMPRESSION: No active disease.  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   On: 05/22/2021 02:50        Scheduled Meds:  vitamin C  500 mg Oral Daily   enoxaparin (LOVENOX) injection  30 mg Subcutaneous Q24H   fluticasone  2 spray Each Nare Daily   guaiFENesin  1,200 mg Oral BID   Ipratropium-Albuterol  1 puff Inhalation Q6H    loratadine  10 mg Oral Daily   methylPREDNISolone (SOLU-MEDROL) injection  60 mg Intravenous BID   mometasone-formoterol  2 puff Inhalation BID   nicotine  14 mg Transdermal Daily   pantoprazole  40 mg Oral Q0600   zinc sulfate  220 mg Oral Daily   Continuous Infusions:  cefTRIAXone (ROCEPHIN)  IV 2 g (05/22/21 1100)   [START ON 05/23/2021] remdesivir 100 mg in NS 100 mL       LOS: 0 days    Time spent: 35 minutes No charge    Irine Seal, MD [facility]   To contact the attending provider between 7A-7P or the covering provider during after hours 7P-7A, please log into the web site [email or web address] and access using universal [place] password for that web site. If you do not have the password, please call the hospital operator.  05/22/2021, 9:09 PM

## 2021-05-23 ENCOUNTER — Other Ambulatory Visit: Payer: Self-pay

## 2021-05-23 ENCOUNTER — Inpatient Hospital Stay (HOSPITAL_COMMUNITY): Payer: Medicare Other

## 2021-05-23 DIAGNOSIS — I10 Essential (primary) hypertension: Secondary | ICD-10-CM | POA: Diagnosis not present

## 2021-05-23 DIAGNOSIS — U071 COVID-19: Secondary | ICD-10-CM | POA: Diagnosis not present

## 2021-05-23 DIAGNOSIS — R739 Hyperglycemia, unspecified: Secondary | ICD-10-CM | POA: Diagnosis not present

## 2021-05-23 DIAGNOSIS — J441 Chronic obstructive pulmonary disease with (acute) exacerbation: Secondary | ICD-10-CM | POA: Diagnosis not present

## 2021-05-23 LAB — COMPREHENSIVE METABOLIC PANEL
ALT: 15 U/L (ref 0–44)
AST: 20 U/L (ref 15–41)
Albumin: 3.3 g/dL — ABNORMAL LOW (ref 3.5–5.0)
Alkaline Phosphatase: 56 U/L (ref 38–126)
Anion gap: 6 (ref 5–15)
BUN: 24 mg/dL — ABNORMAL HIGH (ref 8–23)
CO2: 22 mmol/L (ref 22–32)
Calcium: 8.8 mg/dL — ABNORMAL LOW (ref 8.9–10.3)
Chloride: 107 mmol/L (ref 98–111)
Creatinine, Ser: 0.65 mg/dL (ref 0.44–1.00)
GFR, Estimated: >60 mL/min (ref >60)
Glucose, Bld: 215 mg/dL — ABNORMAL HIGH (ref 70–99)
Potassium: 4.3 mmol/L (ref 3.5–5.1)
Sodium: 135 mmol/L (ref 135–145)
Total Bilirubin: 0.3 mg/dL (ref 0.3–1.2)
Total Protein: 6.1 g/dL — ABNORMAL LOW (ref 6.5–8.1)

## 2021-05-23 LAB — CBC WITH DIFFERENTIAL/PLATELET
Abs Immature Granulocytes: 0.02 10*3/uL (ref 0.00–0.07)
Basophils Absolute: 0 10*3/uL (ref 0.0–0.1)
Basophils Relative: 0 %
Eosinophils Absolute: 0 10*3/uL (ref 0.0–0.5)
Eosinophils Relative: 0 %
HCT: 37.9 % (ref 36.0–46.0)
Hemoglobin: 12 g/dL (ref 12.0–15.0)
Immature Granulocytes: 0 %
Lymphocytes Relative: 10 %
Lymphs Abs: 0.7 10*3/uL (ref 0.7–4.0)
MCH: 28.1 pg (ref 26.0–34.0)
MCHC: 31.7 g/dL (ref 30.0–36.0)
MCV: 88.8 fL (ref 80.0–100.0)
Monocytes Absolute: 0.3 10*3/uL (ref 0.1–1.0)
Monocytes Relative: 4 %
Neutro Abs: 5.6 10*3/uL (ref 1.7–7.7)
Neutrophils Relative %: 86 %
Platelets: 173 10*3/uL (ref 150–400)
RBC: 4.27 MIL/uL (ref 3.87–5.11)
RDW: 14.4 % (ref 11.5–15.5)
WBC: 6.6 10*3/uL (ref 4.0–10.5)
nRBC: 0 % (ref 0.0–0.2)

## 2021-05-23 LAB — URINALYSIS, ROUTINE W REFLEX MICROSCOPIC
Bacteria, UA: NONE SEEN
Bilirubin Urine: NEGATIVE
Glucose, UA: NEGATIVE mg/dL
Hgb urine dipstick: NEGATIVE
Ketones, ur: NEGATIVE mg/dL
Leukocytes,Ua: NEGATIVE
Nitrite: NEGATIVE
Protein, ur: 30 mg/dL — AB
Specific Gravity, Urine: 1.031 — ABNORMAL HIGH (ref 1.005–1.030)
pH: 5 (ref 5.0–8.0)

## 2021-05-23 LAB — GLUCOSE, CAPILLARY
Glucose-Capillary: 143 mg/dL — ABNORMAL HIGH (ref 70–99)
Glucose-Capillary: 160 mg/dL — ABNORMAL HIGH (ref 70–99)
Glucose-Capillary: 188 mg/dL — ABNORMAL HIGH (ref 70–99)
Glucose-Capillary: 155 mg/dL — ABNORMAL HIGH (ref 70–99)

## 2021-05-23 LAB — C-REACTIVE PROTEIN: CRP: 1.2 mg/dL — ABNORMAL HIGH (ref <1.0)

## 2021-05-23 LAB — FERRITIN: Ferritin: 59 ng/mL (ref 11–307)

## 2021-05-23 LAB — D-DIMER, QUANTITATIVE: D-Dimer, Quant: 0.33 ug/mL-FEU (ref 0.00–0.50)

## 2021-05-23 LAB — PHOSPHORUS: Phosphorus: 3.2 mg/dL (ref 2.5–4.6)

## 2021-05-23 LAB — MAGNESIUM: Magnesium: 2.3 mg/dL (ref 1.7–2.4)

## 2021-05-23 MED ORDER — BISACODYL 10 MG RE SUPP
10.0000 mg | Freq: Once | RECTAL | Status: AC
  Administered 2021-05-23: 10 mg via RECTAL
  Filled 2021-05-23: qty 1

## 2021-05-23 MED ORDER — SORBITOL 70 % SOLN
30.0000 mL | Freq: Once | Status: DC
  Filled 2021-05-23: qty 30

## 2021-05-23 MED ORDER — SODIUM CHLORIDE 0.9 % IV SOLN: INTRAVENOUS | Status: DC

## 2021-05-23 MED ORDER — POLYETHYLENE GLYCOL 3350 17 G PO PACK
17.0000 g | PACK | Freq: Two times a day (BID) | ORAL | Status: DC
  Administered 2021-05-23 – 2021-06-01 (×16): 17 g via ORAL
  Filled 2021-05-23 (×17): qty 1

## 2021-05-23 MED ORDER — ALUM & MAG HYDROXIDE-SIMETH 200-200-20 MG/5ML PO SUSP
30.0000 mL | ORAL | Status: DC | PRN
  Administered 2021-05-23 – 2021-05-25 (×2): 30 mL via ORAL
  Filled 2021-05-23 (×2): qty 30

## 2021-05-23 MED ORDER — PROCHLORPERAZINE EDISYLATE 10 MG/2ML IJ SOLN
10.0000 mg | Freq: Four times a day (QID) | INTRAMUSCULAR | Status: DC | PRN
  Administered 2021-05-23 – 2021-05-31 (×2): 10 mg via INTRAVENOUS
  Filled 2021-05-23 (×2): qty 2

## 2021-05-23 MED ORDER — SENNOSIDES-DOCUSATE SODIUM 8.6-50 MG PO TABS
1.0000 | ORAL_TABLET | Freq: Two times a day (BID) | ORAL | Status: DC
  Administered 2021-05-23 – 2021-06-01 (×18): 1 via ORAL
  Filled 2021-05-23 (×19): qty 1

## 2021-05-23 MED ORDER — FLEET ENEMA 7-19 GM/118ML RE ENEM
1.0000 | ENEMA | Freq: Once | RECTAL | Status: AC
  Administered 2021-05-23: 1 via RECTAL
  Filled 2021-05-23: qty 1

## 2021-05-23 MED ORDER — ORAL CARE MOUTH RINSE
15.0000 mL | Freq: Two times a day (BID) | OROMUCOSAL | Status: DC
  Administered 2021-05-23 – 2021-06-01 (×18): 15 mL via OROMUCOSAL

## 2021-05-23 NOTE — Progress Notes (Signed)
Patient continues to complain of feeling the need to have a bowel movement but unable to pass any stool, Miralax and Senokot administered po.  Dulcolax suppository administered with minimal results.  Fleets enema administered, no immediate results.  Will continue to monitor.

## 2021-05-23 NOTE — Progress Notes (Signed)
Patient unable to void despite several attempts in the bathroom. Intermittent catheterization yielded 150 mL clear yellow urine as per MD order.

## 2021-05-23 NOTE — Evaluation (Signed)
Physical Therapy Evaluation Patient Details Name: Brenda Gibson MRN: [id number] DOB: [birth year] Today's Date: 05/23/2021   History of Present Illness  Patient is a 80 year old female who presented to the ED with shortness of breath. patient was found to have acute hypoxic respiratory failure, COVID 19, and COPD exacerbation. PMH:HTN, schizoaffective disorder, and COPD.  Clinical Impression  Pt is a 80 y.o. female with above HPI resulting in the deficits listed below (see PT Problem List). Pt required MIN guard-supervision for transfers and ambulation ~37f, pt limited with further distance due to fatigue. Pt on 2L [place] at beginning of session with O2 low of 86% with mobility, recovery to 91% at EOS while on 2L [place]. Pt reports prior level of independence without use of assistive device and lives with her son who works. Recommend SNF at this time, unless family is able to provide 24/7 supervision as pt requires increased assist/supervision compared to baselineto maintain safety. Pt will benefit from skilled PT to increase their independence and safety with mobility.     Follow Up Recommendations SNF;Supervision for mobility/OOB    Equipment Recommendations  None recommended by PT    Recommendations for Other Services       Precautions / Restrictions Precautions Precautions: Fall Restrictions Weight Bearing Restrictions: No      Mobility  Bed Mobility Overal bed mobility: Modified Independent             General bed mobility comments: use of bed rails with increased time    Transfers Overall transfer level: Needs assistance Equipment used: None Transfers: Sit to/from Stand Sit to Stand: Min guard         General transfer comment: x2 from EOB and toilet; MIN guard for safety with transfers, pt demonstrated safe hand placement with use of B UEs for power up and no AD.  Ambulation/Gait Ambulation/Gait assistance: Min guard;Supervision Gait Distance (Feet): 10  Feet Assistive device: None Gait Pattern/deviations: Step-through pattern;Trunk flexed Gait velocity: decr   General Gait Details: Pt ambulated to toilet in room and back to bed with MIN guard progressing to supervision for safety, no LOB observed. Pt with SOB after ambulating to toilet with O2 low of 86% on 2L [place]. cues for pursed lip breathing, O2 recovery to 91%. O2 sat at EOS in supine 92% on 2L [place].  Stairs            Wheelchair Mobility    Modified Rankin (Stroke Patients Only)       Balance Overall balance assessment: Needs assistance Sitting-balance support: Feet supported Sitting balance-Leahy Scale: Good Sitting balance - Comments: pt able to maintain seated balance on toilet with supervision provided for safety   Standing balance support: No upper extremity supported Standing balance-Leahy Scale: Fair                               Pertinent Vitals/Pain Pain Assessment: No/denies pain    Home Living Family/patient expects to be discharged to:: Private residence Living Arrangements: Children (son) Available Help at Discharge: Family Type of Home: House Home Access: Stairs to enter Entrance Stairs-Rails: Left Entrance Stairs-Number of Steps: 3 Home Layout: One level Home Equipment: [place]- 2 wheels;Shower seat Additional Comments: Pt lives with her son who works during the day. Per pt report she has no additional family/friends to assist while son is at work.    Prior Function Level of Independence: Independent  Comments: Pt reports 1 fall in the past year in bathroom at home (injured ribs)     Hand Dominance   Dominant Hand: Right    Extremity/Trunk Assessment   Upper Extremity Assessment Upper Extremity Assessment: Defer to OT evaluation    Lower Extremity Assessment Lower Extremity Assessment: Generalized weakness    Cervical / Trunk Assessment Cervical / Trunk Assessment: Kyphotic  Communication   Communication: No  difficulties  Cognition Arousal/Alertness: Awake/alert Behavior During Therapy: WFL for tasks assessed/performed Overall Cognitive Status: Within Functional Limits for tasks assessed                                        General Comments      Exercises     Assessment/Plan    PT Assessment Patient needs continued PT services  PT Problem List Decreased strength;Decreased activity tolerance;Decreased balance;Decreased mobility       PT Treatment Interventions Gait training;Stair training;Functional mobility training;Therapeutic activities;Therapeutic exercise;Balance training;Patient/family education    PT Goals (Current goals can be found in the Care Plan section)  Acute Rehab PT Goals Patient Stated Goal: to go home PT Goal Formulation: With patient Time For Goal Achievement: 06/06/21 Potential to Achieve Goals: Good    Frequency Min 2X/week   Barriers to discharge        Co-evaluation               AM-PAC PT "6 Clicks" Mobility  Outcome Measure Help needed turning from your back to your side while in a flat bed without using bedrails?: None Help needed moving from lying on your back to sitting on the side of a flat bed without using bedrails?: A Little Help needed moving to and from a bed to a chair (including a wheelchair)?: A Little Help needed standing up from a chair using your arms (e.g., wheelchair or bedside chair)?: A Little Help needed to walk in hospital room?: A Little Help needed climbing 3-5 steps with a railing? : A Lot 6 Click Score: 18    End of Session Equipment Utilized During Treatment: Gait belt;Oxygen Activity Tolerance: Patient tolerated treatment well;Patient limited by fatigue Patient left: in bed;with call bell/phone within reach;with bed alarm set;with nursing/sitter in room Nurse Communication: Mobility status (O2 sats during session) PT Visit Diagnosis: Other abnormalities of gait and mobility (R26.89);Muscle  weakness (generalized) (M62.81)    Time: [id number] PT Time Calculation (min) (ACUTE ONLY): 16 min   Charges:   PT Evaluation $PT Eval Low Complexity: 1 Low          Festus Barren PT, DPT  Acute Rehabilitation Services  Office [phone]   05/23/2021, 4:13 PM

## 2021-05-23 NOTE — Plan of Care (Signed)
  Problem: Education: Goal: Knowledge of General Education information will improve Description: Including pain rating scale, medication(s)/side effects and non-pharmacologic comfort measures Outcome: Progressing   Problem: Health Behavior/Discharge Planning: Goal: Ability to manage health-related needs will improve Outcome: Progressing   Problem: Clinical Measurements: Goal: Ability to maintain clinical measurements within normal limits will improve Outcome: Progressing Goal: Will remain free from infection Outcome: Progressing Goal: Diagnostic test results will improve Outcome: Progressing Goal: Respiratory complications will improve Outcome: Progressing Goal: Cardiovascular complication will be avoided Outcome: Progressing   Problem: Activity: Goal: Risk for activity intolerance will decrease Outcome: Progressing   Problem: Nutrition: Goal: Adequate nutrition will be maintained Outcome: Progressing   Problem: Coping: Goal: Level of anxiety will decrease Outcome: Progressing   Problem: Elimination: Goal: Will not experience complications related to bowel motility Outcome: Progressing Pt with distended abdomen, nausea with small amount of emesis this shift.  pt given suppository, fleet enema, docusate-senna and miralax on days with minimal results.   Pt states she is not passing gas. Goal: Will not experience complications related to urinary retention Outcome: Progressing   Problem: Pain Managment: Goal: General experience of comfort will improve Outcome: Progressing   Problem: Safety: Goal: Ability to remain free from injury will improve Outcome: Progressing   Problem: Skin Integrity: Goal: Risk for impaired skin integrity will decrease Outcome: Progressing

## 2021-05-23 NOTE — Progress Notes (Signed)
SATURATION QUALIFICATIONS: (This note is used to comply with regulatory documentation for home oxygen)  Patient Saturations on Room Air at Rest = 89%  Patient Saturations on Room Air while Ambulating = 86%  Patient Saturations on 2 Liters of oxygen while Ambulating = 89%  Please briefly explain why patient needs home oxygen: Patient desaturates with exertion.

## 2021-05-23 NOTE — Progress Notes (Addendum)
PROGRESS NOTE    Brenda Gibson  [id number] DOB: 18-Nov-1940 DOA: 05/22/2021 PCP: Lance Sell, NP    No chief complaint on file.   Brief Narrative:  Patient is 80 year old female history of hypertension, schizoaffective disorder, COPD with ongoing tobacco use presented to the ED with shortness of breath with no improvement on home nebs with associated wheezing.  Chest x-ray done negative.  Patient noted to be hypoxic with sats in the upper 80s on room air on presentation.  COVID-19 PCR positive.  Patient admitted for acute COPD exacerbation   Assessment & Plan:   Principal Problem:   Acute hypoxemic respiratory failure due to COVID-19 [facility]) Active Problems:   COPD exacerbation (HCC)   Tobacco abuse   Essential hypertension, benign   Hypokalemia   COVID-19 virus infection   Hyperglycemia  1 acute hypoxemic respiratory failure secondary to acute COPD exacerbation and COVID-19 infection. -Patient states no significant change with shortness of breath. -Patient noted with sats of 88% on room air. -Continue IV remdesivir, Solu-Medrol 60 mg IV every 12 hours, Flonase, Mucinex, Claritin, PPI, scheduled Combivent, Dulera, vitamin C, zinc, IV Rocephin.   -Check ambulatory sats in the next 24 to 48 hours.   -Supportive care.   2.  COVID-19 infection -Patient presented worsening shortness of breath, noted to be hypoxic. -Chest x-ray negative for any acute infiltrate. -Continue IV remdesivir, IV Solu-Medrol every 12 hours, scheduled Combivent, Dulera, vitamin C, zinc.   -Follow inflammatory markers of CRP, D-dimer, ferritin.   -Follow.  3.  Acute COPD exacerbation -Likely triggered by COVID-19 infection. -Patient noted with sats of 88% on room air. -Continue IV Solu-Medrol 60 mg every 12, scheduled Combivent, Dulera, Mucinex, Claritin, PPI, IV Rocephin.   -Supportive care.   -Tobacco cessation.   -Nicotine patch.    4.  Hypertension- -Blood pressure noted to be  borderline/soft on admission and/antihypertensive medications on hold. -Placed on IV fluids. -Follow  5.  Hypokalemia -Magnesium level at 2.3. -Potassium repleted currently of 4.3. -Follow.  6.  Hyperglycemia -Likely steroid-induced. -Hemoglobin A1c pending.   -CBG 143 this morning.   -Continue SSI.   7.  Tobacco abuse -Tobacco cessation -Nicotine patch.  8.  Constipation -MiraLAX twice daily, Senokot-S twice daily. -Dulcolax suppository x1. -Fleets enema as needed.   DVT prophylaxis: Lovenox Code Status: Full Family Communication: Updated patient.  No family at bedside. Disposition:   Status is: Inpatient  Remains inpatient appropriate because:IV treatments appropriate due to intensity of illness or inability to take PO  Dispo: The patient is from: Home              Anticipated d/c is to: Home              Patient currently is not medically stable to d/c.   Difficult to place patient No       Consultants:  None  Procedures:  Chest x-ray 05/22/2021  Antimicrobials:  IV Rocephin 05/22/2021>>>>   Subjective: Sitting up in bed about to work with therapy.  Denies any chest pain.  Still with some shortness of breath.  Complain abdominal discomfort and constipation.  Asking for laxative.    Objective: Vitals:   05/22/21 2114 05/23/21 0400 05/23/21 0905 05/23/21 1159  BP: (!) 148/73 115/60  128/72  Pulse: 66 66  77  Resp: (!) 21 (!) 21  18  Temp: 98.2 F (36.8 C) 99 F (37.2 C)  97.8 F (36.6 C)  TempSrc: Oral Oral  Oral  SpO2: 96%  93% (!) 88% 94%  Weight:      Height:        Intake/Output Summary (Last 24 hours) at 05/23/2021 1414 Last data filed at 05/23/2021 1301 Gross per 24 hour  Intake 340 ml  Output --  Net 340 ml   Filed Weights   05/22/21 0207  Weight: 45.4 kg    Examination:  General exam: NAD Respiratory system: Diffuse coarse breath sounds, some expiratory wheezing noted, no crackles.  Fair air movement.  Speaking in full  sentences.  Cardiovascular system: Regular rate rhythm no murmurs rubs or gallops.  No JVD.  No lower extremity edema.  Gastrointestinal system: Abdomen is soft, nondistended, some diffuse abdominal discomfort.  No rebound.  No guarding.  Positive bowel sounds.  Central nervous system: Alert and oriented.  Moving extremities spontaneously.  No focal neurological deficits.   Extremities: Symmetric 5 x 5 power. Skin: No rashes, lesions or ulcers Psychiatry: Judgement and insight appear normal. Mood & affect appropriate.     Data Reviewed: I have personally reviewed following labs and imaging studies  CBC: Recent Labs  Lab 05/22/21 0220 05/22/21 0232 05/23/21 0319  WBC 3.8*  --  6.6  NEUTROABS 1.5*  --  5.6  HGB 13.7 14.6 12.0  HCT 41.7 43.0 37.9  MCV 89.3  --  88.8  PLT 188  --  173     Basic Metabolic Panel: Recent Labs  Lab 05/22/21 0232 05/22/21 1050 05/23/21 0319  NA 137 139 135  K 2.7* 4.2 4.3  CL 98 102 107  CO2  --  23 22  GLUCOSE 124* 261* 215*  BUN 15 17 24*  CREATININE 0.50 0.65 0.65  CALCIUM  --  8.9 8.8*  MG  --  2.6* 2.3  PHOS  --  2.7 3.2     GFR: Estimated Creatinine Clearance: 41.5 mL/min (by C-G formula based on SCr of 0.65 mg/dL).  Liver Function Tests: Recent Labs  Lab 05/23/21 0319  AST 20  ALT 15  ALKPHOS 56  BILITOT 0.3  PROT 6.1*  ALBUMIN 3.3*    CBG: Recent Labs  Lab 05/22/21 2124 05/23/21 0804 05/23/21 1157  GLUCAP 160* 143* 160*     Recent Results (from the past 240 hour(s))  Resp Panel by RT-PCR (Flu A&B, Covid) Nasopharyngeal Swab     Status: Abnormal   Collection Time: 05/22/21  2:09 AM   Specimen: Nasopharyngeal Swab; Nasopharyngeal(NP) swabs in vial transport medium  Result Value Ref Range Status   SARS Coronavirus 2 by RT PCR POSITIVE (A) NEGATIVE Final    Comment: RESULT CALLED TO, READ BACK BY AND VERIFIED WITH: Leonides Sake, RN @ 0430 ON 05/22/21 C VARNER (NOTE) SARS-CoV-2 target nucleic acids are  DETECTED.  The SARS-CoV-2 RNA is generally detectable in upper respiratory specimens during the acute phase of infection. Positive results are indicative of the presence of the identified virus, but do not rule out bacterial infection or co-infection with other pathogens not detected by the test. Clinical correlation with patient history and other diagnostic information is necessary to determine patient infection status. The expected result is Negative.  Fact Sheet for Patients: EntrepreneurPulse.com.au  Fact Sheet for Healthcare Providers: IncredibleEmployment.be  This test is not yet approved or cleared by the [place] FDA and  has been authorized for detection and/or diagnosis of SARS-CoV-2 by FDA under an Emergency Use Authorization (EUA).  This EUA will remain in effect (meaning this test can  be used) for the duration of  the COVID-19 declaration under Section 564(b)(1) of the Act, 21 U.S.C. section 360bbb-3(b)(1), unless the authorization is terminated or revoked sooner.     Influenza A by PCR NEGATIVE NEGATIVE Final   Influenza B by PCR NEGATIVE NEGATIVE Final    Comment: (NOTE) The Xpert Xpress SARS-CoV-2/FLU/RSV plus assay is intended as an aid in the diagnosis of influenza from Nasopharyngeal swab specimens and should not be used as a sole basis for treatment. Nasal washings and aspirates are unacceptable for Xpert Xpress SARS-CoV-2/FLU/RSV testing.  Fact Sheet for Patients: EntrepreneurPulse.com.au  Fact Sheet for Healthcare Providers: IncredibleEmployment.be  This test is not yet approved or cleared by the [place] FDA and has been authorized for detection and/or diagnosis of SARS-CoV-2 by FDA under an Emergency Use Authorization (EUA). This EUA will remain in effect (meaning this test can be used) for the duration of the COVID-19 declaration under Section 564(b)(1) of the Act, 21  U.S.C. section 360bbb-3(b)(1), unless the authorization is terminated or revoked.  Performed at [facility], [place] [address] [place], [place] [address]           Radiology Studies: DG Chest 1 View  Result Date: 05/22/2021 CLINICAL DATA:  Shortness of breath EXAM: CHEST  1 VIEW COMPARISON:  04/09/2021 FINDINGS: hyperinflation with probable emphysema. No focal opacity or pleural effusion. Normal heart size. No pneumothorax. Chronic right-sided rib fractures. IMPRESSION: No active disease.  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   On: 05/22/2021 02:50        Scheduled Meds:  vitamin C  500 mg Oral Daily   enoxaparin (LOVENOX) injection  30 mg Subcutaneous Q24H   fluticasone  2 spray Each Nare Daily   guaiFENesin  1,200 mg Oral BID   insulin aspart  0-5 Units Subcutaneous QHS   insulin aspart  0-9 Units Subcutaneous TID WC   Ipratropium-Albuterol  1 puff Inhalation Q6H   loratadine  10 mg Oral Daily   methylPREDNISolone (SOLU-MEDROL) injection  60 mg Intravenous BID   mometasone-formoterol  2 puff Inhalation BID   nicotine  14 mg Transdermal Daily   pantoprazole  40 mg Oral Q0600   polyethylene glycol  17 g Oral BID   senna-docusate  1 tablet Oral BID   sorbitol  30 mL Oral Once   zinc sulfate  220 mg Oral Daily   Continuous Infusions:  cefTRIAXone (ROCEPHIN)  IV 2 g (05/23/21 1123)   remdesivir 100 mg in NS 100 mL 100 mg (05/23/21 1016)     LOS: 1 day    Time spent: 35 minutes     Irine Seal, MD [facility]   To contact the attending provider between 7A-7P or the covering provider during after hours 7P-7A, please log into the web site [email or web address] and access using universal [place] password for that web site. If you do not have the password, please call the hospital operator.  05/23/2021, 2:14 PM

## 2021-05-24 ENCOUNTER — Inpatient Hospital Stay (HOSPITAL_COMMUNITY): Payer: Medicare Other

## 2021-05-24 DIAGNOSIS — J441 Chronic obstructive pulmonary disease with (acute) exacerbation: Secondary | ICD-10-CM | POA: Diagnosis not present

## 2021-05-24 DIAGNOSIS — R739 Hyperglycemia, unspecified: Secondary | ICD-10-CM | POA: Diagnosis not present

## 2021-05-24 DIAGNOSIS — U071 COVID-19: Secondary | ICD-10-CM | POA: Diagnosis not present

## 2021-05-24 DIAGNOSIS — I10 Essential (primary) hypertension: Secondary | ICD-10-CM | POA: Diagnosis not present

## 2021-05-24 DIAGNOSIS — K56600 Partial intestinal obstruction, unspecified as to cause: Secondary | ICD-10-CM | POA: Clinically undetermined

## 2021-05-24 LAB — URINE CULTURE: Culture: NO GROWTH

## 2021-05-24 LAB — CBC WITH DIFFERENTIAL/PLATELET
Abs Immature Granulocytes: 0.11 10*3/uL — ABNORMAL HIGH (ref 0.00–0.07)
Basophils Absolute: 0 10*3/uL (ref 0.0–0.1)
Basophils Relative: 0 %
Eosinophils Absolute: 0 10*3/uL (ref 0.0–0.5)
Eosinophils Relative: 0 %
HCT: 38.3 % (ref 36.0–46.0)
Hemoglobin: 12.2 g/dL (ref 12.0–15.0)
Immature Granulocytes: 1 %
Lymphocytes Relative: 7 %
Lymphs Abs: 1 10*3/uL (ref 0.7–4.0)
MCH: 28 pg (ref 26.0–34.0)
MCHC: 31.9 g/dL (ref 30.0–36.0)
MCV: 88 fL (ref 80.0–100.0)
Monocytes Absolute: 0.7 10*3/uL (ref 0.1–1.0)
Monocytes Relative: 5 %
Neutro Abs: 11.9 10*3/uL — ABNORMAL HIGH (ref 1.7–7.7)
Neutrophils Relative %: 87 %
Platelets: 224 10*3/uL (ref 150–400)
RBC: 4.35 MIL/uL (ref 3.87–5.11)
RDW: 14.6 % (ref 11.5–15.5)
WBC: 13.6 10*3/uL — ABNORMAL HIGH (ref 4.0–10.5)
nRBC: 0 % (ref 0.0–0.2)

## 2021-05-24 LAB — FERRITIN: Ferritin: 69 ng/mL (ref 11–307)

## 2021-05-24 LAB — COMPREHENSIVE METABOLIC PANEL
ALT: 12 U/L (ref 0–44)
AST: 17 U/L (ref 15–41)
Albumin: 3.1 g/dL — ABNORMAL LOW (ref 3.5–5.0)
Alkaline Phosphatase: 52 U/L (ref 38–126)
Anion gap: 5 (ref 5–15)
BUN: 19 mg/dL (ref 8–23)
CO2: 24 mmol/L (ref 22–32)
Calcium: 8.5 mg/dL — ABNORMAL LOW (ref 8.9–10.3)
Chloride: 105 mmol/L (ref 98–111)
Creatinine, Ser: 0.41 mg/dL — ABNORMAL LOW (ref 0.44–1.00)
GFR, Estimated: >60 mL/min (ref >60)
Glucose, Bld: 136 mg/dL — ABNORMAL HIGH (ref 70–99)
Potassium: 4.3 mmol/L (ref 3.5–5.1)
Sodium: 134 mmol/L — ABNORMAL LOW (ref 135–145)
Total Bilirubin: 0.4 mg/dL (ref 0.3–1.2)
Total Protein: 5.6 g/dL — ABNORMAL LOW (ref 6.5–8.1)

## 2021-05-24 LAB — GLUCOSE, CAPILLARY
Glucose-Capillary: 128 mg/dL — ABNORMAL HIGH (ref 70–99)
Glucose-Capillary: 155 mg/dL — ABNORMAL HIGH (ref 70–99)
Glucose-Capillary: 150 mg/dL — ABNORMAL HIGH (ref 70–99)

## 2021-05-24 LAB — D-DIMER, QUANTITATIVE: D-Dimer, Quant: 0.36 ug/mL-FEU (ref 0.00–0.50)

## 2021-05-24 LAB — C-REACTIVE PROTEIN: CRP: <0.5 mg/dL (ref <1.0)

## 2021-05-24 MED ORDER — BOOST / RESOURCE BREEZE PO LIQD CUSTOM
1.0000 | Freq: Three times a day (TID) | ORAL | Status: DC
  Administered 2021-05-24: 1 via ORAL

## 2021-05-24 MED ORDER — SIMETHICONE 80 MG PO CHEW
80.0000 mg | CHEWABLE_TABLET | Freq: Once | ORAL | Status: AC
  Administered 2021-05-24: 80 mg via ORAL
  Filled 2021-05-24: qty 1

## 2021-05-24 MED ORDER — SIMETHICONE 80 MG PO CHEW
160.0000 mg | CHEWABLE_TABLET | Freq: Four times a day (QID) | ORAL | Status: DC
  Administered 2021-05-24 – 2021-06-01 (×30): 160 mg via ORAL
  Filled 2021-05-24 (×30): qty 2

## 2021-05-24 MED ORDER — ADULT MULTIVITAMIN W/MINERALS CH
1.0000 | ORAL_TABLET | Freq: Every day | ORAL | Status: DC
  Administered 2021-05-24: 1 via ORAL
  Filled 2021-05-24: qty 1

## 2021-05-24 MED ORDER — PROSOURCE PLUS PO LIQD
30.0000 mL | Freq: Two times a day (BID) | ORAL | Status: DC
  Administered 2021-05-24 – 2021-05-27 (×2): 30 mL via ORAL
  Filled 2021-05-24 (×5): qty 30

## 2021-05-24 MED ORDER — PANTOPRAZOLE SODIUM 40 MG IV SOLR
40.0000 mg | Freq: Every day | INTRAVENOUS | Status: DC
  Administered 2021-05-24 – 2021-05-29 (×6): 40 mg via INTRAVENOUS
  Filled 2021-05-24 (×6): qty 40

## 2021-05-24 MED ORDER — INSULIN ASPART 100 UNIT/ML IJ SOLN
0.0000 [IU] | Freq: Four times a day (QID) | INTRAMUSCULAR | Status: DC
  Administered 2021-05-24 – 2021-05-25 (×3): 1 [IU] via SUBCUTANEOUS
  Administered 2021-05-25: 2 [IU] via SUBCUTANEOUS
  Administered 2021-05-25 – 2021-05-30 (×12): 1 [IU] via SUBCUTANEOUS
  Administered 2021-05-30: 2 [IU] via SUBCUTANEOUS
  Administered 2021-05-31 – 2021-06-01 (×3): 1 [IU] via SUBCUTANEOUS
  Administered 2021-06-01: 2 [IU] via SUBCUTANEOUS
  Administered 2021-06-01: 1 [IU] via SUBCUTANEOUS

## 2021-05-24 NOTE — Consult Note (Signed)
CC: SBO  Requesting provider: Dr. Cline Cools  HPI: Brenda Gibson is an 80 y.o. female who we have been asked to see for a possible bowel obstruction.  She was admitted on 7/22 with respiratory failure secondary to COPD.  She was found to be COVID-positive.  Since admission, she has been unable to move her bowels and had 1 episode of emesis.  She denies abdominal pain.  She underwent a's CAT scan of the abdomen and pelvis without contrast which showed dilated loops of small bowel.  It was suggestive of either an ileus or partial small bowel obstruction.  She did have an exploratory laparotomy in 2017 for a small bowel obstruction with the findings of 1 single adhesive band creating the obstruction.  She had had no prior surgical history.  She reports she is passing some flatus today and has had no emesis since yesterday.  She is also been having some issues voiding.  Past Medical History:  Diagnosis Date   Basal cell carcinoma of ala nasi    Cataract    Hypertension    Knee pain, bilateral    Measles    Myalgia    Seborrheic dermatitis of scalp    Varicella     Past Surgical History:  Procedure Laterality Date   BASAL CELL CARCINOMA EXCISION     '11 ([facility])   CATARACT EXTRACTION W/ INTRAOCULAR LENS IMPLANT     right eye. '09 (Dr. Dolores Lory)   LAPAROTOMY N/A 06/25/2016   Procedure: EXPLORATORY LAPAROTOMY, lysis of adhesion;  Surgeon: Coralie Keens, MD;  Location: [facility];  Service: General;  Laterality: N/A;   LYSIS OF ADHESION  06/25/2016   Dr Nedra Hai    Family History  Problem Relation Age of Onset   Alcohol abuse Mother    Cancer Father 17       stomach cancer   Prostate cancer Father    Hypertension Maternal Grandfather    Diabetes Neg Hx    Heart disease Neg Hx     Social:  reports that she has been smoking cigarettes. She has a 31.20 pack-year smoking history. She has never used smokeless tobacco. She reports current alcohol use. She reports that she does not use  drugs.  Allergies: No Known Allergies  Medications: I have reviewed the patient's current medications.  Results for orders placed or performed during the hospital encounter of 05/22/21 (from the past 48 hour(s))  Glucose, capillary     Status: Abnormal   Collection Time: 05/22/21  9:24 PM  Result Value Ref Range   Glucose-Capillary 160 (H) 70 - 99 mg/dL    Comment: Glucose reference range applies only to samples taken after fasting for at least 8 hours.  CBC with Differential/Platelet     Status: None   Collection Time: 05/23/21  3:19 AM  Result Value Ref Range   WBC 6.6 4.0 - 10.5 K/uL   RBC 4.27 3.87 - 5.11 MIL/uL   Hemoglobin 12.0 12.0 - 15.0 g/dL   HCT 37.9 36.0 - 46.0 %   MCV 88.8 80.0 - 100.0 fL   MCH 28.1 26.0 - 34.0 pg   MCHC 31.7 30.0 - 36.0 g/dL   RDW 14.4 11.5 - 15.5 %   Platelets 173 150 - 400 K/uL   nRBC 0.0 0.0 - 0.2 %   Neutrophils Relative % 86 %   Neutro Abs 5.6 1.7 - 7.7 K/uL   Lymphocytes Relative 10 %   Lymphs Abs 0.7 0.7 - 4.0 K/uL  Monocytes Relative 4 %   Monocytes Absolute 0.3 0.1 - 1.0 K/uL   Eosinophils Relative 0 %   Eosinophils Absolute 0.0 0.0 - 0.5 K/uL   Basophils Relative 0 %   Basophils Absolute 0.0 0.0 - 0.1 K/uL   Immature Granulocytes 0 %   Abs Immature Granulocytes 0.02 0.00 - 0.07 K/uL    Comment: Performed at [facility], [place] [address], [place], [place] [address]  Comprehensive metabolic panel     Status: Abnormal   Collection Time: 05/23/21  3:19 AM  Result Value Ref Range   Sodium 135 135 - 145 mmol/L   Potassium 4.3 3.5 - 5.1 mmol/L   Chloride 107 98 - 111 mmol/L   CO2 22 22 - 32 mmol/L   Glucose, Bld 215 (H) 70 - 99 mg/dL    Comment: Glucose reference range applies only to samples taken after fasting for at least 8 hours.   BUN 24 (H) 8 - 23 mg/dL   Creatinine, Ser 0.65 0.44 - 1.00 mg/dL   Calcium 8.8 (L) 8.9 - 10.3 mg/dL   Total Protein 6.1 (L) 6.5 - 8.1 g/dL   Albumin 3.3 (L) 3.5 - 5.0 g/dL   AST 20  15 - 41 U/L   ALT 15 0 - 44 U/L   Alkaline Phosphatase 56 38 - 126 U/L   Total Bilirubin 0.3 0.3 - 1.2 mg/dL   GFR, Estimated >60 >60 mL/min    Comment: (NOTE) Calculated using the CKD-EPI Creatinine Equation (2021)    Anion gap 6 5 - 15    Comment: Performed at [facility], [place] [address] [place], [place] [address]  C-reactive protein     Status: Abnormal   Collection Time: 05/23/21  3:19 AM  Result Value Ref Range   CRP 1.2 (H) <1.0 mg/dL    Comment: Performed at [facility], [place] [address], [place], [place] [address]  D-dimer, quantitative     Status: None   Collection Time: 05/23/21  3:19 AM  Result Value Ref Range   D-Dimer, Quant 0.33 0.00 - 0.50 ug/mL-FEU    Comment: (NOTE) At the manufacturer cut-off value of 0.5 g/mL FEU, this assay has a negative predictive value of 95-100%.This assay is intended for use in conjunction with a clinical pretest probability (PTP) assessment model to exclude pulmonary embolism (PE) and deep venous thrombosis (DVT) in outpatients suspected of PE or DVT. Results should be correlated with clinical presentation. Performed at [facility], [place] [address], [place], [place] [address]   Ferritin     Status: None   Collection Time: 05/23/21  3:19 AM  Result Value Ref Range   Ferritin 59 11 - 307 ng/mL    Comment: Performed at [facility], [place] [address] [place] [address], [place], [place] [address]  Magnesium     Status: None   Collection Time: 05/23/21  3:19 AM  Result Value Ref Range   Magnesium 2.3 1.7 - 2.4 mg/dL    Comment: Performed at [facility], [place] [address] [place], [place] [address]  Phosphorus     Status: None   Collection Time: 05/23/21  3:19 AM  Result Value Ref Range   Phosphorus 3.2 2.5 - 4.6 mg/dL    Comment: Performed at [facility], [place] [address], [place], [place] [address]  Glucose, capillary     Status: Abnormal    Collection Time: 05/23/21  8:04 AM  Result Value Ref Range  Glucose-Capillary 143 (H) 70 - 99 mg/dL    Comment: Glucose reference range applies only to samples taken after fasting for at least 8 hours.  Glucose, capillary     Status: Abnormal   Collection Time: 05/23/21 11:57 AM  Result Value Ref Range   Glucose-Capillary 160 (H) 70 - 99 mg/dL    Comment: Glucose reference range applies only to samples taken after fasting for at least 8 hours.  Urinalysis, Routine w reflex microscopic Urine, In & Out Cath     Status: Abnormal   Collection Time: 05/23/21  3:41 PM  Result Value Ref Range   Color, Urine YELLOW YELLOW   APPearance CLEAR CLEAR   Specific Gravity, Urine 1.031 (H) 1.005 - 1.030   pH 5.0 5.0 - 8.0   Glucose, UA NEGATIVE NEGATIVE mg/dL   Hgb urine dipstick NEGATIVE NEGATIVE   Bilirubin Urine NEGATIVE NEGATIVE   Ketones, ur NEGATIVE NEGATIVE mg/dL   Protein, ur 30 (A) NEGATIVE mg/dL   Nitrite NEGATIVE NEGATIVE   Leukocytes,Ua NEGATIVE NEGATIVE   RBC / HPF 0-5 0 - 5 RBC/hpf   WBC, UA 0-5 0 - 5 WBC/hpf   Bacteria, UA NONE SEEN NONE SEEN   Mucus PRESENT     Comment: Performed at [facility], [place] [address], [place], [place] [address]  Glucose, capillary     Status: Abnormal   Collection Time: 05/23/21  4:28 PM  Result Value Ref Range   Glucose-Capillary 188 (H) 70 - 99 mg/dL    Comment: Glucose reference range applies only to samples taken after fasting for at least 8 hours.  Glucose, capillary     Status: Abnormal   Collection Time: 05/23/21  7:40 PM  Result Value Ref Range   Glucose-Capillary 155 (H) 70 - 99 mg/dL    Comment: Glucose reference range applies only to samples taken after fasting for at least 8 hours.  Comprehensive metabolic panel     Status: Abnormal   Collection Time: 05/24/21  4:27 AM  Result Value Ref Range   Sodium 134 (L) 135 - 145 mmol/L   Potassium 4.3 3.5 - 5.1 mmol/L   Chloride 105 98 - 111 mmol/L   CO2 24 22 - 32 mmol/L    Glucose, Bld 136 (H) 70 - 99 mg/dL    Comment: Glucose reference range applies only to samples taken after fasting for at least 8 hours.   BUN 19 8 - 23 mg/dL   Creatinine, Ser 0.41 (L) 0.44 - 1.00 mg/dL   Calcium 8.5 (L) 8.9 - 10.3 mg/dL   Total Protein 5.6 (L) 6.5 - 8.1 g/dL   Albumin 3.1 (L) 3.5 - 5.0 g/dL   AST 17 15 - 41 U/L   ALT 12 0 - 44 U/L   Alkaline Phosphatase 52 38 - 126 U/L   Total Bilirubin 0.4 0.3 - 1.2 mg/dL   GFR, Estimated >60 >60 mL/min    Comment: (NOTE) Calculated using the CKD-EPI Creatinine Equation (2021)    Anion gap 5 5 - 15    Comment: Performed at [facility], [place] [address] [place], [place] [address]  C-reactive protein     Status: None   Collection Time: 05/24/21  4:27 AM  Result Value Ref Range   CRP <0.5 <1.0 mg/dL    Comment: Performed at [facility], [place] [address], [place], [place] [address]  D-dimer, quantitative     Status: None   Collection Time: 05/24/21  4:27 AM  Result Value Ref Range   D-Dimer, Quant 0.36 0.00 - 0.50 ug/mL-FEU    Comment: (NOTE) At the manufacturer cut-off value of 0.5 g/mL FEU, this assay has a negative predictive value of 95-100%.This assay is intended for use in conjunction with a clinical pretest probability (PTP) assessment model to exclude pulmonary embolism (PE) and deep venous thrombosis (DVT) in outpatients suspected of PE or DVT. Results should be correlated with clinical presentation. Performed at [facility], [place] [address] [place], [place] [address]   Ferritin     Status: None   Collection Time: 05/24/21  4:27 AM  Result Value Ref Range   Ferritin 69 11 - 307 ng/mL    Comment: Performed at [facility], [place] [address], [place], [place] [address]  CBC with Differential/Platelet     Status: Abnormal   Collection Time: 05/24/21  7:24 AM  Result Value Ref Range   WBC 13.6 (H) 4.0 - 10.5 K/uL   RBC 4.35 3.87 - 5.11 MIL/uL    Hemoglobin 12.2 12.0 - 15.0 g/dL   HCT 38.3 36.0 - 46.0 %   MCV 88.0 80.0 - 100.0 fL   MCH 28.0 26.0 - 34.0 pg   MCHC 31.9 30.0 - 36.0 g/dL   RDW 14.6 11.5 - 15.5 %   Platelets 224 150 - 400 K/uL   nRBC 0.0 0.0 - 0.2 %   Neutrophils Relative % 87 %   Neutro Abs 11.9 (H) 1.7 - 7.7 K/uL   Lymphocytes Relative 7 %   Lymphs Abs 1.0 0.7 - 4.0 K/uL   Monocytes Relative 5 %   Monocytes Absolute 0.7 0.1 - 1.0 K/uL   Eosinophils Relative 0 %   Eosinophils Absolute 0.0 0.0 - 0.5 K/uL   Basophils Relative 0 %   Basophils Absolute 0.0 0.0 - 0.1 K/uL   Immature Granulocytes 1 %   Abs Immature Granulocytes 0.11 (H) 0.00 - 0.07 K/uL    Comment: Performed at [facility], [place] [address] [place], [place] [address]  Glucose, capillary     Status: Abnormal   Collection Time: 05/24/21  7:24 AM  Result Value Ref Range   Glucose-Capillary 128 (H) 70 - 99 mg/dL    Comment: Glucose reference range applies only to samples taken after fasting for at least 8 hours.  Glucose, capillary     Status: Abnormal   Collection Time: 05/24/21 11:23 AM  Result Value Ref Range   Glucose-Capillary 155 (H) 70 - 99 mg/dL    Comment: Glucose reference range applies only to samples taken after fasting for at least 8 hours.    CT ABDOMEN PELVIS WO CONTRAST  Result Date: 05/24/2021 CLINICAL DATA:  Inpatient. Generalized abdominal pain, abdominal distension and excessive gas. EXAM: CT ABDOMEN AND PELVIS WITHOUT CONTRAST TECHNIQUE: Multidetector CT imaging of the abdomen and pelvis was performed following the standard protocol without IV contrast. COMPARISON:  05/23/2021 abdominal radiograph. 06/19/2016 CT abdomen/pelvis. FINDINGS: Lower chest: Indistinct 1.0 cm sub solid nodular opacity in the posterior left lower lobe (series 4/image 13). Coronary atherosclerosis. Hepatobiliary: Normal liver size. No liver mass. Cholelithiasis. No gallbladder wall thickening. No gallbladder distention. No biliary ductal  dilatation. Pancreas: Scattered tiny calcifications throughout periphery of the pancreas, favor vascular calcifications. No pancreatic mass or significant duct dilation on these noncontrast images. Spleen: Normal size. No mass. Adrenals/Urinary Tract: Normal adrenals. No renal stones. No contour deforming renal masses. Mild fullness of the central right renal collecting system without overt right  hydronephrosis. No left hydronephrosis. Small cystocele, unchanged. Otherwise normal bladder. Stomach/Bowel: Normal non-distended stomach. There are numerous moderately dilated mid small bowel loops with scattered small air-fluid levels throughout the abdomen and pelvis up to 4.7 cm diameter. The distal and terminal ileum are collapsed. No discrete small bowel caliber transition identified. No small bowel wall thickening or pneumatosis. Appendix not discretely visualized. No pericecal inflammatory changes. Moderate gas and mild stool throughout the large bowel with no large bowel wall thickening. Mild sigmoid diverticulosis without associated wall thickening or significant pericolonic fat stranding. Vascular/Lymphatic: Atherosclerotic nonaneurysmal abdominal aorta. No pathologically enlarged lymph nodes in the abdomen or pelvis. Reproductive: Stable right deviated small uterus. No adnexal masses. Other: No pneumoperitoneum. Small volume ascites. No focal fluid collection. Musculoskeletal: No aggressive appearing focal osseous lesions. Mild thoracolumbar spondylosis. IMPRESSION: 1. Several moderately dilated mid small bowel loops with small air-fluid levels throughout the abdomen and pelvis, with collapsed distal and terminal ileum. No discrete small bowel caliber transition identified. Findings are suggestive of a partial mechanical mid to distal small bowel obstruction, although the differential includes generalized adynamic ileus given moderate colonic gas. No bowel wall thickening or pneumatosis. No free air. 2. Small  volume ascites. 3. Indistinct 1.0 cm subsolid nodular opacity in the posterior left lower lobe. Follow-up noncontrast chest CT recommended in 3 months. 4. Cholelithiasis. 5. Mild sigmoid diverticulosis. 6. Small cystocele. Fullness of the right renal collecting system without overt hydronephrosis. 7. Aortic Atherosclerosis (ICD10-I70.0). Electronically Signed   By: Ilona Sorrel M.D.   On: 05/24/2021 11:41   DG Abd 1 View  Result Date: 05/23/2021 CLINICAL DATA:  Pt complained of abdominal pain, nausea, and feeling the need to have a bowel movement but unable to pass any stool. Per nurse, pt has had Miralax, Senokot, Dulcolax suppository, and a Fleets enema with minimal results. EXAM: ABDOMEN - 1 VIEW COMPARISON:  06/25/2016. FINDINGS: There is generalized increased bowel gas, both in the colon and small bowel, without significant bowel dilation. No increased colonic stool. Soft tissues are poorly defined due to the overlying bowel gas. Aortoiliac atherosclerotic vascular calcifications are noted. No evidence of renal or ureteral stones. No acute skeletal abnormality. IMPRESSION: 1. No acute findings.  No evidence of bowel obstruction. 2. Generalized increased bowel gas, nonspecific. No increase in colonic stool burden. Electronically Signed   By: Lajean Manes M.D.   On: 05/23/2021 23:44    ROS - all of the below systems have been reviewed with the patient and positives are indicated with bold text General: chills, fever or night sweats Eyes: blurry vision or double vision ENT: epistaxis or sore throat Allergy/Immunology: itchy/watery eyes or nasal congestion Hematologic/Lymphatic: bleeding problems, blood clots or swollen lymph nodes Endocrine: temperature intolerance or unexpected weight changes Breast: new or changing breast lumps or nipple discharge Resp: cough, shortness of breath, or wheezing CV: chest pain or dyspnea on exertion GI: as per HPI GU: dysuria, trouble voiding, or hematuria MSK:  joint pain or joint stiffness Neuro: TIA or stroke symptoms Derm: pruritus and skin lesion changes Psych: anxiety and depression  PE Blood pressure (!) 154/74, pulse 74, temperature 98.7 F (37.1 C), temperature source Oral, resp. rate 18, height '5\' 5"'$  (1.651 m), weight 45.4 kg, SpO2 95 %. Constitutional: NAD; conversant; no deformities Eyes: Moist conjunctiva; no lid lag; anicteric; PERRL Neck: Trachea midline; no thyromegaly Lungs: Normal respiratory effort; no tactile fremitus CV: RRR; no palpable thrills; no pitting edema GI: Abd distended, nontender, no obvious hernia; no palpable hepatosplenomegaly  MSK: Normal range of motion of extremities; no clubbing/cyanosis Psychiatric: Appropriate affect; alert and oriented x3 Lymphatic: No palpable cervical or axillary lymphadenopathy  Results for orders placed or performed during the hospital encounter of 05/22/21 (from the past 48 hour(s))  Glucose, capillary     Status: Abnormal   Collection Time: 05/22/21  9:24 PM  Result Value Ref Range   Glucose-Capillary 160 (H) 70 - 99 mg/dL    Comment: Glucose reference range applies only to samples taken after fasting for at least 8 hours.  CBC with Differential/Platelet     Status: None   Collection Time: 05/23/21  3:19 AM  Result Value Ref Range   WBC 6.6 4.0 - 10.5 K/uL   RBC 4.27 3.87 - 5.11 MIL/uL   Hemoglobin 12.0 12.0 - 15.0 g/dL   HCT 37.9 36.0 - 46.0 %   MCV 88.8 80.0 - 100.0 fL   MCH 28.1 26.0 - 34.0 pg   MCHC 31.7 30.0 - 36.0 g/dL   RDW 14.4 11.5 - 15.5 %   Platelets 173 150 - 400 K/uL   nRBC 0.0 0.0 - 0.2 %   Neutrophils Relative % 86 %   Neutro Abs 5.6 1.7 - 7.7 K/uL   Lymphocytes Relative 10 %   Lymphs Abs 0.7 0.7 - 4.0 K/uL   Monocytes Relative 4 %   Monocytes Absolute 0.3 0.1 - 1.0 K/uL   Eosinophils Relative 0 %   Eosinophils Absolute 0.0 0.0 - 0.5 K/uL   Basophils Relative 0 %   Basophils Absolute 0.0 0.0 - 0.1 K/uL   Immature Granulocytes 0 %   Abs Immature  Granulocytes 0.02 0.00 - 0.07 K/uL    Comment: Performed at [facility], [place] [address] [place], [place] [address]  Comprehensive metabolic panel     Status: Abnormal   Collection Time: 05/23/21  3:19 AM  Result Value Ref Range   Sodium 135 135 - 145 mmol/L   Potassium 4.3 3.5 - 5.1 mmol/L   Chloride 107 98 - 111 mmol/L   CO2 22 22 - 32 mmol/L   Glucose, Bld 215 (H) 70 - 99 mg/dL    Comment: Glucose reference range applies only to samples taken after fasting for at least 8 hours.   BUN 24 (H) 8 - 23 mg/dL   Creatinine, Ser 0.65 0.44 - 1.00 mg/dL   Calcium 8.8 (L) 8.9 - 10.3 mg/dL   Total Protein 6.1 (L) 6.5 - 8.1 g/dL   Albumin 3.3 (L) 3.5 - 5.0 g/dL   AST 20 15 - 41 U/L   ALT 15 0 - 44 U/L   Alkaline Phosphatase 56 38 - 126 U/L   Total Bilirubin 0.3 0.3 - 1.2 mg/dL   GFR, Estimated >60 >60 mL/min    Comment: (NOTE) Calculated using the CKD-EPI Creatinine Equation (2021)    Anion gap 6 5 - 15    Comment: Performed at [facility], [place] [address], [place], [place] [address]  C-reactive protein     Status: Abnormal   Collection Time: 05/23/21  3:19 AM  Result Value Ref Range   CRP 1.2 (H) <1.0 mg/dL    Comment: Performed at [facility], [place] [address] [place], [place] [address]  D-dimer, quantitative     Status: None   Collection Time: 05/23/21  3:19 AM  Result Value Ref Range   D-Dimer, Quant 0.33 0.00 - 0.50 ug/mL-FEU    Comment: (NOTE) At the manufacturer cut-off value  of 0.5 g/mL FEU, this assay has a negative predictive value of 95-100%.This assay is intended for use in conjunction with a clinical pretest probability (PTP) assessment model to exclude pulmonary embolism (PE) and deep venous thrombosis (DVT) in outpatients suspected of PE or DVT. Results should be correlated with clinical presentation. Performed at [facility], [place] [address], [place], [place] [address]   Ferritin      Status: None   Collection Time: 05/23/21  3:19 AM  Result Value Ref Range   Ferritin 59 11 - 307 ng/mL    Comment: Performed at [facility], [place] [address], [place], [place] [address]  Magnesium     Status: None   Collection Time: 05/23/21  3:19 AM  Result Value Ref Range   Magnesium 2.3 1.7 - 2.4 mg/dL    Comment: Performed at [facility], [place] [address], [place], [place] [address]  Phosphorus     Status: None   Collection Time: 05/23/21  3:19 AM  Result Value Ref Range   Phosphorus 3.2 2.5 - 4.6 mg/dL    Comment: Performed at [facility], [place] [address] [place], [place] [address]  Glucose, capillary     Status: Abnormal   Collection Time: 05/23/21  8:04 AM  Result Value Ref Range   Glucose-Capillary 143 (H) 70 - 99 mg/dL    Comment: Glucose reference range applies only to samples taken after fasting for at least 8 hours.  Glucose, capillary     Status: Abnormal   Collection Time: 05/23/21 11:57 AM  Result Value Ref Range   Glucose-Capillary 160 (H) 70 - 99 mg/dL    Comment: Glucose reference range applies only to samples taken after fasting for at least 8 hours.  Urinalysis, Routine w reflex microscopic Urine, In & Out Cath     Status: Abnormal   Collection Time: 05/23/21  3:41 PM  Result Value Ref Range   Color, Urine YELLOW YELLOW   APPearance CLEAR CLEAR   Specific Gravity, Urine 1.031 (H) 1.005 - 1.030   pH 5.0 5.0 - 8.0   Glucose, UA NEGATIVE NEGATIVE mg/dL   Hgb urine dipstick NEGATIVE NEGATIVE   Bilirubin Urine NEGATIVE NEGATIVE   Ketones, ur NEGATIVE NEGATIVE mg/dL   Protein, ur 30 (A) NEGATIVE mg/dL   Nitrite NEGATIVE NEGATIVE   Leukocytes,Ua NEGATIVE NEGATIVE   RBC / HPF 0-5 0 - 5 RBC/hpf   WBC, UA 0-5 0 - 5 WBC/hpf   Bacteria, UA NONE SEEN NONE SEEN   Mucus PRESENT     Comment: Performed at [facility], [place] [address] [place], [place] [address]  Glucose, capillary     Status:  Abnormal   Collection Time: 05/23/21  4:28 PM  Result Value Ref Range   Glucose-Capillary 188 (H) 70 - 99 mg/dL    Comment: Glucose reference range applies only to samples taken after fasting for at least 8 hours.  Glucose, capillary     Status: Abnormal   Collection Time: 05/23/21  7:40 PM  Result Value Ref Range   Glucose-Capillary 155 (H) 70 - 99 mg/dL    Comment: Glucose reference range applies only to samples taken after fasting for at least 8 hours.  Comprehensive metabolic panel     Status: Abnormal   Collection Time: 05/24/21  4:27 AM  Result Value Ref Range   Sodium 134 (L) 135 - 145 mmol/L   Potassium 4.3 3.5 - 5.1 mmol/L   Chloride  105 98 - 111 mmol/L   CO2 24 22 - 32 mmol/L   Glucose, Bld 136 (H) 70 - 99 mg/dL    Comment: Glucose reference range applies only to samples taken after fasting for at least 8 hours.   BUN 19 8 - 23 mg/dL   Creatinine, Ser 0.41 (L) 0.44 - 1.00 mg/dL   Calcium 8.5 (L) 8.9 - 10.3 mg/dL   Total Protein 5.6 (L) 6.5 - 8.1 g/dL   Albumin 3.1 (L) 3.5 - 5.0 g/dL   AST 17 15 - 41 U/L   ALT 12 0 - 44 U/L   Alkaline Phosphatase 52 38 - 126 U/L   Total Bilirubin 0.4 0.3 - 1.2 mg/dL   GFR, Estimated >60 >60 mL/min    Comment: (NOTE) Calculated using the CKD-EPI Creatinine Equation (2021)    Anion gap 5 5 - 15    Comment: Performed at [facility], [place] [address] [place], [place] [address]  C-reactive protein     Status: None   Collection Time: 05/24/21  4:27 AM  Result Value Ref Range   CRP <0.5 <1.0 mg/dL    Comment: Performed at [facility], [place] [address] [place], [place] [address]  D-dimer, quantitative     Status: None   Collection Time: 05/24/21  4:27 AM  Result Value Ref Range   D-Dimer, Quant 0.36 0.00 - 0.50 ug/mL-FEU    Comment: (NOTE) At the manufacturer cut-off value of 0.5 g/mL FEU, this assay has a negative predictive value of 95-100%.This assay is intended for use in conjunction with a  clinical pretest probability (PTP) assessment model to exclude pulmonary embolism (PE) and deep venous thrombosis (DVT) in outpatients suspected of PE or DVT. Results should be correlated with clinical presentation. Performed at [facility], [place] [address], [place], [place] [address]   Ferritin     Status: None   Collection Time: 05/24/21  4:27 AM  Result Value Ref Range   Ferritin 69 11 - 307 ng/mL    Comment: Performed at [facility], [place] [address], [place], [place] [address]  CBC with Differential/Platelet     Status: Abnormal   Collection Time: 05/24/21  7:24 AM  Result Value Ref Range   WBC 13.6 (H) 4.0 - 10.5 K/uL   RBC 4.35 3.87 - 5.11 MIL/uL   Hemoglobin 12.2 12.0 - 15.0 g/dL   HCT 38.3 36.0 - 46.0 %   MCV 88.0 80.0 - 100.0 fL   MCH 28.0 26.0 - 34.0 pg   MCHC 31.9 30.0 - 36.0 g/dL   RDW 14.6 11.5 - 15.5 %   Platelets 224 150 - 400 K/uL   nRBC 0.0 0.0 - 0.2 %   Neutrophils Relative % 87 %   Neutro Abs 11.9 (H) 1.7 - 7.7 K/uL   Lymphocytes Relative 7 %   Lymphs Abs 1.0 0.7 - 4.0 K/uL   Monocytes Relative 5 %   Monocytes Absolute 0.7 0.1 - 1.0 K/uL   Eosinophils Relative 0 %   Eosinophils Absolute 0.0 0.0 - 0.5 K/uL   Basophils Relative 0 %   Basophils Absolute 0.0 0.0 - 0.1 K/uL   Immature Granulocytes 1 %   Abs Immature Granulocytes 0.11 (H) 0.00 - 0.07 K/uL    Comment: Performed at [facility], [place] [address] [place], [place] [address]  Glucose, capillary     Status: Abnormal   Collection Time: 05/24/21  7:24 AM  Result  Value Ref Range   Glucose-Capillary 128 (H) 70 - 99 mg/dL    Comment: Glucose reference range applies only to samples taken after fasting for at least 8 hours.  Glucose, capillary     Status: Abnormal   Collection Time: 05/24/21 11:23 AM  Result Value Ref Range   Glucose-Capillary 155 (H) 70 - 99 mg/dL    Comment: Glucose reference range applies only to samples taken after fasting for  at least 8 hours.    CT ABDOMEN PELVIS WO CONTRAST  Result Date: 05/24/2021 CLINICAL DATA:  Inpatient. Generalized abdominal pain, abdominal distension and excessive gas. EXAM: CT ABDOMEN AND PELVIS WITHOUT CONTRAST TECHNIQUE: Multidetector CT imaging of the abdomen and pelvis was performed following the standard protocol without IV contrast. COMPARISON:  05/23/2021 abdominal radiograph. 06/19/2016 CT abdomen/pelvis. FINDINGS: Lower chest: Indistinct 1.0 cm sub solid nodular opacity in the posterior left lower lobe (series 4/image 13). Coronary atherosclerosis. Hepatobiliary: Normal liver size. No liver mass. Cholelithiasis. No gallbladder wall thickening. No gallbladder distention. No biliary ductal dilatation. Pancreas: Scattered tiny calcifications throughout periphery of the pancreas, favor vascular calcifications. No pancreatic mass or significant duct dilation on these noncontrast images. Spleen: Normal size. No mass. Adrenals/Urinary Tract: Normal adrenals. No renal stones. No contour deforming renal masses. Mild fullness of the central right renal collecting system without overt right hydronephrosis. No left hydronephrosis. Small cystocele, unchanged. Otherwise normal bladder. Stomach/Bowel: Normal non-distended stomach. There are numerous moderately dilated mid small bowel loops with scattered small air-fluid levels throughout the abdomen and pelvis up to 4.7 cm diameter. The distal and terminal ileum are collapsed. No discrete small bowel caliber transition identified. No small bowel wall thickening or pneumatosis. Appendix not discretely visualized. No pericecal inflammatory changes. Moderate gas and mild stool throughout the large bowel with no large bowel wall thickening. Mild sigmoid diverticulosis without associated wall thickening or significant pericolonic fat stranding. Vascular/Lymphatic: Atherosclerotic nonaneurysmal abdominal aorta. No pathologically enlarged lymph nodes in the abdomen or  pelvis. Reproductive: Stable right deviated small uterus. No adnexal masses. Other: No pneumoperitoneum. Small volume ascites. No focal fluid collection. Musculoskeletal: No aggressive appearing focal osseous lesions. Mild thoracolumbar spondylosis. IMPRESSION: 1. Several moderately dilated mid small bowel loops with small air-fluid levels throughout the abdomen and pelvis, with collapsed distal and terminal ileum. No discrete small bowel caliber transition identified. Findings are suggestive of a partial mechanical mid to distal small bowel obstruction, although the differential includes generalized adynamic ileus given moderate colonic gas. No bowel wall thickening or pneumatosis. No free air. 2. Small volume ascites. 3. Indistinct 1.0 cm subsolid nodular opacity in the posterior left lower lobe. Follow-up noncontrast chest CT recommended in 3 months. 4. Cholelithiasis. 5. Mild sigmoid diverticulosis. 6. Small cystocele. Fullness of the right renal collecting system without overt hydronephrosis. 7. Aortic Atherosclerosis (ICD10-I70.0). Electronically Signed   By: Ilona Sorrel M.D.   On: 05/24/2021 11:41   DG Abd 1 View  Result Date: 05/23/2021 CLINICAL DATA:  Pt complained of abdominal pain, nausea, and feeling the need to have a bowel movement but unable to pass any stool. Per nurse, pt has had Miralax, Senokot, Dulcolax suppository, and a Fleets enema with minimal results. EXAM: ABDOMEN - 1 VIEW COMPARISON:  06/25/2016. FINDINGS: There is generalized increased bowel gas, both in the colon and small bowel, without significant bowel dilation. No increased colonic stool. Soft tissues are poorly defined due to the overlying bowel gas. Aortoiliac atherosclerotic vascular calcifications are noted. No evidence of renal or ureteral stones. No  acute skeletal abnormality. IMPRESSION: 1. No acute findings.  No evidence of bowel obstruction. 2. Generalized increased bowel gas, nonspecific. No increase in colonic stool  burden. Electronically Signed   By: Lajean Manes M.D.   On: 05/23/2021 23:44     A/P: Partial small bowel obstruction  I reviewed the CAT scan of the abdomen and pelvis.  This certainly could be a partial obstruction given her previous history versus just an ileus given her current clinical picture.  At this point she does not have a nasogastric tube in place.  I would recommend placing 1 if she develops emesis again.  We will continue bowel regimen and hopefully this will improve without the need for nasogastric tube placement or even surgery.  I have discussed this with her.  We will allow her to have ice chips and sips today.  Coralie Keens, MD [facility] Use [email or web address] to contact on call provider

## 2021-05-24 NOTE — Progress Notes (Addendum)
PROGRESS NOTE    Brenda Gibson  [id number] DOB: 1941/08/27 DOA: 05/22/2021 PCP: Lance Sell, NP    No chief complaint on file.   Brief Narrative:  Patient is 80 year old female history of hypertension, schizoaffective disorder, COPD with ongoing tobacco use presented to the ED with shortness of breath with no improvement on home nebs with associated wheezing.  Chest x-ray done negative.  Patient noted to be hypoxic with sats in the upper 80s on room air on presentation.  COVID-19 PCR positive.  Patient admitted for acute COPD exacerbation   Assessment & Plan:   Principal Problem:   Acute hypoxemic respiratory failure due to COVID-19 [facility]) Active Problems:   COPD exacerbation (HCC)   Tobacco abuse   Essential hypertension, benign   Hypokalemia   COVID-19 virus infection   Hyperglycemia   Partial small bowel obstruction (HCC)  1 acute hypoxemic respiratory failure secondary to acute COPD exacerbation and COVID-19 infection. -Patient stated no significant change with shortness of breath. -Patient noted with sats of 88% on room air. -Continue IV remdesivir, Solu-Medrol 60 mg IV every 12 hours, Flonase, Mucinex, Claritin, PPI, scheduled Combivent, Dulera, vitamin C, zinc, IV Rocephin.   -Supportive care.   2.  COVID-19 infection -Patient presented worsening shortness of breath, noted to be hypoxic. -Chest x-ray negative for any acute infiltrate. -Continue IV remdesivir, IV Solu-Medrol every 12 hours, scheduled Combivent, Dulera, vitamin C, zinc.   -Follow inflammatory markers of D-dimer, ferritin, CRP.   -Follow.    3.  Acute COPD exacerbation -Likely triggered by COVID-19 infection. -Patient noted with sats of 88% on room air. -Continue IV Solu-Medrol every 12 hours, scheduled Combivent, Dulera, Mucinex, Claritin, PPI, IV Rocephin.   -Supportive care.   -Tobacco cessation stressed to patient.   -Nicotine patch.    4.  Partial small bowel obstruction versus  ileus -Patient with concerns for possible constipation per patient. -Patient tried multiple laxatives as well as suppositories and enema with no results. -Patient with a bout of emesis overnight and with ongoing nausea. -No emesis today. -Passing flatus.  No bowel movement -Abdominal films done with increased bowel gas with no evidence of bowel obstruction. -CT abdomen and pelvis done concerning for partial small bowel obstruction versus adynamic ileus. -Patient with prior history of laparotomy with lysis of adhesions. -Make patient n.p.o. -Continue current bowel regimen. -Consult with general surgery for further evaluation and management.  5.  Hypertension- -Blood pressure noted to be borderline/soft on admission and/antihypertensive medications on hold. -Soft blood pressure improved. -Follow  6.  Hypokalemia -Magnesium level at 2.3. -Potassium at 4.3.    7.  Hyperglycemia -Likely steroid-induced. -Hemoglobin A1c pending.  -CBG 128 this morning.  -SSI  8.  Tobacco abuse -Tobacco cessation -Nicotine patch.  9.  ?? Constipation -MiraLAX twice daily, Senokot-S twice daily. -Dulcolax suppository and fleets enema given with no results. -Abdominal films with concern for generalized increased bowel gas, no evidence of bowel obstruction. -CT abdomen and pelvis with concerns of partial small bowel obstruction. -Continue bowel regimen. -Consult with general surgery. -NPO.   DVT prophylaxis: Lovenox Code Status: Full Family Communication: Updated patient.  No family at bedside. Disposition:   Status is: Inpatient  Remains inpatient appropriate because:IV treatments appropriate due to intensity of illness or inability to take PO  Dispo: The patient is from: Home              Anticipated d/c is to: Home  Patient currently is not medically stable to d/c.   Difficult to place patient No       Consultants:  None  Procedures:  Chest x-ray  05/22/2021 Abdominal films 05/23/2021 CT abdomen and pelvis 05/24/2021  Antimicrobials:  IV Rocephin 05/22/2021>>>>   Subjective: Laying in the bed.  Patient noted to have a bout of emesis overnight per RN.  Patient with some complaints of nausea today but no emesis.  Patient denies any chest pain, no significant shortness of breath, no significant abdominal pain.  Positive flatus per RN.  Patient with no BM despite laxatives yesterday, enema, suppositories.   Objective: Vitals:   05/23/21 1159 05/23/21 1942 05/24/21 0340 05/24/21 1234  BP: 128/72 (!) 157/79 (!) 150/75 (!) 154/74  Pulse: 77 81 75 74  Resp: '18 18 18 18  '$ Temp: 97.8 F (36.6 C) 97.8 F (36.6 C) 97.8 F (36.6 C) 98.7 F (37.1 C)  TempSrc: Oral Oral Oral Oral  SpO2: 94% 95% 92% 95%  Weight:      Height:        Intake/Output Summary (Last 24 hours) at 05/24/2021 1302 Last data filed at 05/24/2021 1139 Gross per 24 hour  Intake 1595.43 ml  Output 550 ml  Net 1045.43 ml    Filed Weights   05/22/21 0207  Weight: 45.4 kg    Examination:  General exam: NAD Respiratory system: Diffuse coarse rhonchorous breath sounds with decreasing expiratory wheezes.  No crackles.  Fair air movement.  Speaking in full sentences.   Cardiovascular system: RRR no murmurs rubs or gallops.  No JVD.  No lower extremity edema.  Gastrointestinal system: Abdomen is soft, mildly distended, some diffuse abdominal discomfort, positive bowel sounds.  No rebound.  No guarding.  Central nervous system: Alert and oriented.  No focal neurological deficits.  Moving extremities spontaneously.    Extremities: Symmetric 5 x 5 power. Skin: No rashes, lesions or ulcers Psychiatry: Judgement and insight appear normal. Mood & affect appropriate.     Data Reviewed: I have personally reviewed following labs and imaging studies  CBC: Recent Labs  Lab 05/22/21 0220 05/22/21 0232 05/23/21 0319 05/24/21 0724  WBC 3.8*  --  6.6 13.6*  NEUTROABS 1.5*   --  5.6 11.9*  HGB 13.7 14.6 12.0 12.2  HCT 41.7 43.0 37.9 38.3  MCV 89.3  --  88.8 88.0  PLT 188  --  173 224     Basic Metabolic Panel: Recent Labs  Lab 05/22/21 0232 05/22/21 1050 05/23/21 0319 05/24/21 0427  NA 137 139 135 134*  K 2.7* 4.2 4.3 4.3  CL 98 102 107 105  CO2  --  '23 22 24  '$ GLUCOSE 124* 261* 215* 136*  BUN 15 17 24* 19  CREATININE 0.50 0.65 0.65 0.41*  CALCIUM  --  8.9 8.8* 8.5*  MG  --  2.6* 2.3  --   PHOS  --  2.7 3.2  --      GFR: Estimated Creatinine Clearance: 41.5 mL/min (A) (by C-G formula based on SCr of 0.41 mg/dL (L)).  Liver Function Tests: Recent Labs  Lab 05/23/21 0319 05/24/21 0427  AST 20 17  ALT 15 12  ALKPHOS 56 52  BILITOT 0.3 0.4  PROT 6.1* 5.6*  ALBUMIN 3.3* 3.1*     CBG: Recent Labs  Lab 05/23/21 1157 05/23/21 1628 05/23/21 1940 05/24/21 0724 05/24/21 1123  GLUCAP 160* 188* 155* 128* 155*      Recent Results (from the past 240 hour(s))  Resp Panel by RT-PCR (Flu A&B, Covid) Nasopharyngeal Swab     Status: Abnormal   Collection Time: 05/22/21  2:09 AM   Specimen: Nasopharyngeal Swab; Nasopharyngeal(NP) swabs in vial transport medium  Result Value Ref Range Status   SARS Coronavirus 2 by RT PCR POSITIVE (A) NEGATIVE Final    Comment: RESULT CALLED TO, READ BACK BY AND VERIFIED WITH: Leonides Sake, RN @ 0430 ON 05/22/21 C VARNER (NOTE) SARS-CoV-2 target nucleic acids are DETECTED.  The SARS-CoV-2 RNA is generally detectable in upper respiratory specimens during the acute phase of infection. Positive results are indicative of the presence of the identified virus, but do not rule out bacterial infection or co-infection with other pathogens not detected by the test. Clinical correlation with patient history and other diagnostic information is necessary to determine patient infection status. The expected result is Negative.  Fact Sheet for Patients: EntrepreneurPulse.com.au  Fact Sheet for Healthcare  Providers: IncredibleEmployment.be  This test is not yet approved or cleared by the [place] FDA and  has been authorized for detection and/or diagnosis of SARS-CoV-2 by FDA under an Emergency Use Authorization (EUA).  This EUA will remain in effect (meaning this test can  be used) for the duration of  the COVID-19 declaration under Section 564(b)(1) of the Act, 21 U.S.C. section 360bbb-3(b)(1), unless the authorization is terminated or revoked sooner.     Influenza A by PCR NEGATIVE NEGATIVE Final   Influenza B by PCR NEGATIVE NEGATIVE Final    Comment: (NOTE) The Xpert Xpress SARS-CoV-2/FLU/RSV plus assay is intended as an aid in the diagnosis of influenza from Nasopharyngeal swab specimens and should not be used as a sole basis for treatment. Nasal washings and aspirates are unacceptable for Xpert Xpress SARS-CoV-2/FLU/RSV testing.  Fact Sheet for Patients: EntrepreneurPulse.com.au  Fact Sheet for Healthcare Providers: IncredibleEmployment.be  This test is not yet approved or cleared by the [place] FDA and has been authorized for detection and/or diagnosis of SARS-CoV-2 by FDA under an Emergency Use Authorization (EUA). This EUA will remain in effect (meaning this test can be used) for the duration of the COVID-19 declaration under Section 564(b)(1) of the Act, 21 U.S.C. section 360bbb-3(b)(1), unless the authorization is terminated or revoked.  Performed at [facility], [place] [address], [place], [place] [address]           Radiology Studies: CT ABDOMEN PELVIS WO CONTRAST  Result Date: 05/24/2021 CLINICAL DATA:  Inpatient. Generalized abdominal pain, abdominal distension and excessive gas. EXAM: CT ABDOMEN AND PELVIS WITHOUT CONTRAST TECHNIQUE: Multidetector CT imaging of the abdomen and pelvis was performed following the standard protocol without IV contrast. COMPARISON:  05/23/2021  abdominal radiograph. 06/19/2016 CT abdomen/pelvis. FINDINGS: Lower chest: Indistinct 1.0 cm sub solid nodular opacity in the posterior left lower lobe (series 4/image 13). Coronary atherosclerosis. Hepatobiliary: Normal liver size. No liver mass. Cholelithiasis. No gallbladder wall thickening. No gallbladder distention. No biliary ductal dilatation. Pancreas: Scattered tiny calcifications throughout periphery of the pancreas, favor vascular calcifications. No pancreatic mass or significant duct dilation on these noncontrast images. Spleen: Normal size. No mass. Adrenals/Urinary Tract: Normal adrenals. No renal stones. No contour deforming renal masses. Mild fullness of the central right renal collecting system without overt right hydronephrosis. No left hydronephrosis. Small cystocele, unchanged. Otherwise normal bladder. Stomach/Bowel: Normal non-distended stomach. There are numerous moderately dilated mid small bowel loops with scattered small air-fluid levels throughout the abdomen and pelvis up to 4.7 cm diameter. The distal and terminal ileum are collapsed. No  discrete small bowel caliber transition identified. No small bowel wall thickening or pneumatosis. Appendix not discretely visualized. No pericecal inflammatory changes. Moderate gas and mild stool throughout the large bowel with no large bowel wall thickening. Mild sigmoid diverticulosis without associated wall thickening or significant pericolonic fat stranding. Vascular/Lymphatic: Atherosclerotic nonaneurysmal abdominal aorta. No pathologically enlarged lymph nodes in the abdomen or pelvis. Reproductive: Stable right deviated small uterus. No adnexal masses. Other: No pneumoperitoneum. Small volume ascites. No focal fluid collection. Musculoskeletal: No aggressive appearing focal osseous lesions. Mild thoracolumbar spondylosis. IMPRESSION: 1. Several moderately dilated mid small bowel loops with small air-fluid levels throughout the abdomen and  pelvis, with collapsed distal and terminal ileum. No discrete small bowel caliber transition identified. Findings are suggestive of a partial mechanical mid to distal small bowel obstruction, although the differential includes generalized adynamic ileus given moderate colonic gas. No bowel wall thickening or pneumatosis. No free air. 2. Small volume ascites. 3. Indistinct 1.0 cm subsolid nodular opacity in the posterior left lower lobe. Follow-up noncontrast chest CT recommended in 3 months. 4. Cholelithiasis. 5. Mild sigmoid diverticulosis. 6. Small cystocele. Fullness of the right renal collecting system without overt hydronephrosis. 7. Aortic Atherosclerosis (ICD10-I70.0). Electronically Signed   By: Ilona Sorrel M.D.   On: 05/24/2021 11:41   DG Abd 1 View  Result Date: 05/23/2021 CLINICAL DATA:  Pt complained of abdominal pain, nausea, and feeling the need to have a bowel movement but unable to pass any stool. Per nurse, pt has had Miralax, Senokot, Dulcolax suppository, and a Fleets enema with minimal results. EXAM: ABDOMEN - 1 VIEW COMPARISON:  06/25/2016. FINDINGS: There is generalized increased bowel gas, both in the colon and small bowel, without significant bowel dilation. No increased colonic stool. Soft tissues are poorly defined due to the overlying bowel gas. Aortoiliac atherosclerotic vascular calcifications are noted. No evidence of renal or ureteral stones. No acute skeletal abnormality. IMPRESSION: 1. No acute findings.  No evidence of bowel obstruction. 2. Generalized increased bowel gas, nonspecific. No increase in colonic stool burden. Electronically Signed   By: Lajean Manes M.D.   On: 05/23/2021 23:44        Scheduled Meds:  (feeding supplement) PROSource Plus  30 mL Oral BID BM   vitamin C  500 mg Oral Daily   enoxaparin (LOVENOX) injection  30 mg Subcutaneous Q24H   feeding supplement  1 Container Oral TID BM   fluticasone  2 spray Each Nare Daily   guaiFENesin  1,200 mg  Oral BID   insulin aspart  0-5 Units Subcutaneous QHS   insulin aspart  0-9 Units Subcutaneous TID WC   Ipratropium-Albuterol  1 puff Inhalation Q6H   loratadine  10 mg Oral Daily   mouth rinse  15 mL Mouth Rinse BID   methylPREDNISolone (SOLU-MEDROL) injection  60 mg Intravenous BID   mometasone-formoterol  2 puff Inhalation BID   multivitamin with minerals  1 tablet Oral Daily   nicotine  14 mg Transdermal Daily   pantoprazole  40 mg Oral Q0600   polyethylene glycol  17 g Oral BID   senna-docusate  1 tablet Oral BID   simethicone  160 mg Oral QID   sorbitol  30 mL Oral Once   zinc sulfate  220 mg Oral Daily   Continuous Infusions:  sodium chloride 125 mL/hr at 05/24/21 1001   cefTRIAXone (ROCEPHIN)  IV 2 g (05/24/21 1155)   remdesivir 100 mg in NS 100 mL 100 mg (05/24/21 1109)  LOS: 2 days    Time spent: 35 minutes     Irine Seal, MD [facility]   To contact the attending provider between 7A-7P or the covering provider during after hours 7P-7A, please log into the web site [email or web address] and access using universal [place] password for that web site. If you do not have the password, please call the hospital operator.  05/24/2021, 1:02 PM

## 2021-05-24 NOTE — Plan of Care (Signed)
  Problem: Health Behavior/Discharge Planning: Goal: Ability to manage health-related needs will improve Outcome: Progressing   Problem: Clinical Measurements: Goal: Ability to maintain clinical measurements within normal limits will improve Outcome: Progressing Goal: Will remain free from infection Outcome: Progressing Goal: Diagnostic test results will improve Outcome: Progressing Goal: Respiratory complications will improve Outcome: Progressing Goal: Cardiovascular complication will be avoided Outcome: Progressing   Problem: Activity: Goal: Risk for activity intolerance will decrease Outcome: Progressing   Problem: Nutrition: Goal: Adequate nutrition will be maintained Outcome: Progressing   Problem: Coping: Goal: Level of anxiety will decrease Outcome: Progressing   Problem: Elimination: Goal: Will not experience complications related to bowel motility Outcome: Progressing Pt still with no BM, passing gas, nausea no vomiting  Goal: Will not experience complications related to urinary retention Outcome: Progressing  Pt voiding, no issues  Problem: Pain Managment: Goal: General experience of comfort will improve Outcome: Progressing   Problem: Safety: Goal: Ability to remain free from injury will improve Outcome: Progressing   Problem: Skin Integrity: Goal: Risk for impaired skin integrity will decrease Outcome: Progressing

## 2021-05-24 NOTE — Progress Notes (Signed)
Initial Nutrition Assessment  DOCUMENTATION CODES:   Underweight  INTERVENTION:   -Boost Breeze po TID, each supplement provides 250 kcal and 9 grams of protein  -Prosource Plus PO BID, each provides 100 kcals and 15g protein  -Multivitamin with minerals daily  NUTRITION DIAGNOSIS:   Increased nutrient needs related to acute illness as evidenced by estimated needs.  GOAL:   Patient will meet greater than or equal to 90% of their needs  MONITOR:   PO intake, Supplement acceptance, Labs, Weight trends, I & O's, Diet advancement  REASON FOR ASSESSMENT:   Consult Assessment of nutrition requirement/status  ASSESSMENT:   80 year old female history of hypertension, schizoaffective disorder, COPD with ongoing tobacco use presented to the ED with shortness of breath with no improvement on home nebs with associated wheezing.  Chest x-ray done negative.  Patient noted to be hypoxic with sats in the upper 80s on room air on presentation.  COVID-19 PCR positive.  Patient admitted for acute COPD exacerbation  Patient now on clear liquids today. Having c/o constipation with no results from laxatives and enemas. Pt was consumeding 25-100% of solid diet yesterday.  While on clears, will order Boost Breeze and Prosource. Will recommend Ensure for once diet is advanced.   Pt is underweight based on BMI. Per weight records, pt has lost 5 lbs since November 2021 (insignificant for time frame) but any weight loss when underweight is concerning.  Medications: Vitamin C, Miralax, Senokot, Zinc sulfate, IV Zofran  Labs reviewed:  CBGs: 128-188 Low Na  NUTRITION - FOCUSED PHYSICAL EXAM:  Unable to complete  Diet Order:   Diet Order             Diet clear liquid Room service appropriate? Yes; Fluid consistency: Thin  Diet effective now                   EDUCATION NEEDS:   No education needs have been identified at this time  Skin:  Skin Assessment: Reviewed RN  Assessment  Last BM:  7/23 -type 1  Height:   Ht Readings from Last 1 Encounters:  05/22/21 '5\' 5"'$  (1.651 m)    Weight:   Wt Readings from Last 1 Encounters:  05/22/21 45.4 kg    BMI:  Body mass index is 16.64 kg/m.  Estimated Nutritional Needs:   Kcal:  1450-1650  Protein:  70-85g  Fluid:  1.6L/day   Clayton Bibles, MS, RD, LDN Inpatient Clinical Dietitian Contact information available via Amion

## 2021-05-24 NOTE — Progress Notes (Signed)
PT is complaining of feeling like she has to urinate, Pt has been getting up to attempt to urinate approximately every 5-10 min without passing any urine she has had one unmeasured void and that was round 10pm...pt has gotten 1L NS so far, attempted to bladder scan but cannot get a good read due to abdominal distention, the most we were able to see was approximately 84m.  NA worked with the patient last night and said that her abdomen was nearly flat last night.  In and out cath was ordered and completed, with 1077mout.

## 2021-05-24 NOTE — Plan of Care (Signed)
Patient up to bathroom with multiple voids on 7 a to 7 p shift, has passed no stool although still feels she needs to have a bowel movement.  Medicated for nausea once earlier in the shift, no vomiting.

## 2021-05-24 NOTE — TOC Initial Note (Signed)
Transition of Care [facility]) - Initial/Assessment Note    Patient Details  Name: Brenda Gibson MRN: [id number] Date of Birth: 02/17/1941  Transition of [facility]) CM/SW Contact:    Purcell Mouton, RN Phone Number: 05/24/2021, 3:21 PM  Clinical Narrative:                 Spoke with pt concerning SNF and HH. Pt states that she will go home and declined SNF and HH at present time. Pt states that she is doing okay, right now.   Expected Discharge Plan: Home/Self Care Barriers to Discharge: No Barriers Identified   Patient Goals and CMS Choice Patient states their goals for this hospitalization and ongoing recovery are:: To go home CMS Medicare.gov Compare Post Acute Care list provided to:: Patient Choice offered to / list presented to : Patient  Expected Discharge Plan and Services Expected Discharge Plan: Home/Self Care       Living arrangements for the past 2 months: Single Family Home                                      Prior Living Arrangements/Services Living arrangements for the past 2 months: Single Family Home Lives with:: Adult Children Patient language and need for interpreter reviewed:: No Do you feel safe going back to the place where you live?: Yes        Care giver support system in place?: Yes (comment)      Activities of Daily Living Home Assistive Devices/Equipment: None ADL Screening (condition at time of admission) Patient's cognitive ability adequate to safely complete daily activities?: Yes Is the patient deaf or have difficulty hearing?: No Does the patient have difficulty seeing, even when wearing glasses/contacts?: No Does the patient have difficulty concentrating, remembering, or making decisions?: No Patient able to express need for assistance with ADLs?: Yes Does the patient have difficulty dressing or bathing?: No Independently performs ADLs?: Yes (appropriate for developmental age) Does the patient have difficulty walking or  climbing stairs?: No Weakness of Legs: None Weakness of Arms/Hands: None  Permission Sought/Granted Permission sought to share information with : Case Manager                Emotional Assessment Appearance:: Appears stated age     Orientation: : Oriented to Self, Oriented to Place, Oriented to  Time, Oriented to Situation      Admission diagnosis:  Hypokalemia [E87.6] COPD exacerbation ([place]) [J44.1] Acute hypoxemic respiratory failure due to COVID-19 ([place]) [U07.1, J96.01] Patient Active Problem List   Diagnosis Date Noted   Partial small bowel obstruction ([place]) 05/24/2021   Acute hypoxemic respiratory failure due to COVID-19 ([place]) 05/22/2021   COPD exacerbation ([place]) 05/22/2021   COVID-19 virus infection 05/22/2021   Hyperglycemia 05/22/2021   Closed fracture of left tibial plateau 08/14/2020   Physical deconditioning 07/30/2016   Small bowel obstruction ([place])    Hypokalemia 07/04/2016   Hypomagnesemia 07/04/2016   Urinary retention 07/04/2016   Ileus, postoperative ([place]) 07/02/2016   Malnutrition of moderate degree 06/28/2016   SBO (small bowel obstruction) s/p lysis of adhesion 06/25/2016 06/21/2016   Enteritis 06/21/2016   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   Elevated blood pressure reading without diagnosis of hypertension 06/19/2016   Medicare annual wellness visit, subsequent 02/23/2016   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

## 2021-05-24 NOTE — Progress Notes (Signed)
  This RN checked patient for impaction, no stool noted in rectal vault.

## 2021-05-25 ENCOUNTER — Inpatient Hospital Stay (HOSPITAL_COMMUNITY): Payer: Medicare Other

## 2021-05-25 DIAGNOSIS — R739 Hyperglycemia, unspecified: Secondary | ICD-10-CM | POA: Diagnosis not present

## 2021-05-25 DIAGNOSIS — U071 COVID-19: Secondary | ICD-10-CM | POA: Diagnosis not present

## 2021-05-25 DIAGNOSIS — I10 Essential (primary) hypertension: Secondary | ICD-10-CM | POA: Diagnosis not present

## 2021-05-25 DIAGNOSIS — J441 Chronic obstructive pulmonary disease with (acute) exacerbation: Secondary | ICD-10-CM | POA: Diagnosis not present

## 2021-05-25 LAB — CBC WITH DIFFERENTIAL/PLATELET
Abs Immature Granulocytes: 0.07 10*3/uL (ref 0.00–0.07)
Basophils Absolute: 0 10*3/uL (ref 0.0–0.1)
Basophils Relative: 0 %
Eosinophils Absolute: 0 10*3/uL (ref 0.0–0.5)
Eosinophils Relative: 0 %
HCT: 40.2 % (ref 36.0–46.0)
Hemoglobin: 13 g/dL (ref 12.0–15.0)
Immature Granulocytes: 1 %
Lymphocytes Relative: 6 %
Lymphs Abs: 0.7 10*3/uL (ref 0.7–4.0)
MCH: 28.1 pg (ref 26.0–34.0)
MCHC: 32.3 g/dL (ref 30.0–36.0)
MCV: 87 fL (ref 80.0–100.0)
Monocytes Absolute: 0.5 10*3/uL (ref 0.1–1.0)
Monocytes Relative: 4 %
Neutro Abs: 10.5 10*3/uL — ABNORMAL HIGH (ref 1.7–7.7)
Neutrophils Relative %: 89 %
Platelets: 238 10*3/uL (ref 150–400)
RBC: 4.62 MIL/uL (ref 3.87–5.11)
RDW: 14.5 % (ref 11.5–15.5)
WBC: 11.9 10*3/uL — ABNORMAL HIGH (ref 4.0–10.5)
nRBC: 0 % (ref 0.0–0.2)

## 2021-05-25 LAB — HEMOGLOBIN A1C
Hgb A1c MFr Bld: 6.3 % — ABNORMAL HIGH (ref 4.8–5.6)
Mean Plasma Glucose: 134 mg/dL

## 2021-05-25 LAB — COMPREHENSIVE METABOLIC PANEL
ALT: 16 U/L (ref 0–44)
AST: 21 U/L (ref 15–41)
Albumin: 3.5 g/dL (ref 3.5–5.0)
Alkaline Phosphatase: 54 U/L (ref 38–126)
Anion gap: 8 (ref 5–15)
BUN: 11 mg/dL (ref 8–23)
CO2: 26 mmol/L (ref 22–32)
Calcium: 8.4 mg/dL — ABNORMAL LOW (ref 8.9–10.3)
Chloride: 96 mmol/L — ABNORMAL LOW (ref 98–111)
Creatinine, Ser: 0.44 mg/dL (ref 0.44–1.00)
GFR, Estimated: >60 mL/min (ref >60)
Glucose, Bld: 96 mg/dL (ref 70–99)
Potassium: 3.2 mmol/L — ABNORMAL LOW (ref 3.5–5.1)
Sodium: 130 mmol/L — ABNORMAL LOW (ref 135–145)
Total Bilirubin: 0.5 mg/dL (ref 0.3–1.2)
Total Protein: 6 g/dL — ABNORMAL LOW (ref 6.5–8.1)

## 2021-05-25 LAB — GLUCOSE, CAPILLARY
Glucose-Capillary: 122 mg/dL — ABNORMAL HIGH (ref 70–99)
Glucose-Capillary: 126 mg/dL — ABNORMAL HIGH (ref 70–99)
Glucose-Capillary: 135 mg/dL — ABNORMAL HIGH (ref 70–99)
Glucose-Capillary: 183 mg/dL — ABNORMAL HIGH (ref 70–99)

## 2021-05-25 LAB — FERRITIN: Ferritin: 100 ng/mL (ref 11–307)

## 2021-05-25 LAB — C-REACTIVE PROTEIN: CRP: 0.5 mg/dL (ref <1.0)

## 2021-05-25 LAB — MAGNESIUM: Magnesium: 2 mg/dL (ref 1.7–2.4)

## 2021-05-25 LAB — D-DIMER, QUANTITATIVE: D-Dimer, Quant: 0.44 ug/mL-FEU (ref 0.00–0.50)

## 2021-05-25 MED ORDER — POTASSIUM CHLORIDE 10 MEQ/100ML IV SOLN
10.0000 meq | INTRAVENOUS | Status: AC
  Administered 2021-05-25 (×4): 10 meq via INTRAVENOUS
  Filled 2021-05-25 (×4): qty 100

## 2021-05-25 MED ORDER — DIATRIZOATE MEGLUMINE & SODIUM 66-10 % PO SOLN
90.0000 mL | Freq: Once | ORAL | Status: DC
  Filled 2021-05-25: qty 90

## 2021-05-25 MED ORDER — SODIUM CHLORIDE 0.9 % IV SOLN: INTRAVENOUS | Status: DC

## 2021-05-25 MED ORDER — POTASSIUM CHLORIDE IN NACL 40-0.9 MEQ/L-% IV SOLN
INTRAVENOUS | Status: DC
  Filled 2021-05-25 (×3): qty 1000

## 2021-05-25 NOTE — Care Management Important Message (Signed)
Important Message  Patient Details IM Letter given to the Patient. Name: Brenda Gibson MRN: [id number] Date of Birth: [birth year]   Medicare Important Message Given:  Yes     Kerin Salen 05/25/2021, 12:09 PM

## 2021-05-25 NOTE — Progress Notes (Signed)
Subjective: CC: Patient reports no abdominal pain but feels distended and nauseous.  She did have some flatus prior to presentation but none since then.  Objective: Vital signs in last 24 hours: Temp:  [98 F (36.7 C)-98.8 F (37.1 C)] 98.4 F (36.9 C) (07/25 1129) Pulse Rate:  [74-78] 76 (07/25 1129) Resp:  [16-20] 16 (07/25 1129) BP: (154-172)/(74-86) 163/85 (07/25 1129) SpO2:  [92 %-97 %] 92 % (07/25 1129) Last BM Date: 05/23/21  Intake/Output from previous day: 07/24 0701 - 07/25 0700 In: 3244.2 [I.V.:3036; IV Piggyback:208.3] Out: 1700 [Urine:1700] Intake/Output this shift: Total I/O In: -  Out: 300 [Urine:300]  PE: Gen:  Alert, NAD, pleasant Cards: Reg Pulm: normal rate and effort  Abd: Soft, moderate distension, NT, hypoactive bowel sounds Psych: A&Ox3  Skin: no rashes noted, warm and dry  Lab Results:  Recent Labs    05/24/21 0724 05/25/21 0336  WBC 13.6* 11.9*  HGB 12.2 13.0  HCT 38.3 40.2  PLT 224 238   BMET Recent Labs    05/24/21 0427 05/25/21 0336  NA 134* 130*  K 4.3 3.2*  CL 105 96*  CO2 24 26  GLUCOSE 136* 96  BUN 19 11  CREATININE 0.41* 0.44  CALCIUM 8.5* 8.4*   PT/INR No results for input(s): LABPROT, INR in the last 72 hours. CMP     Component Value Date/Time   NA 130 (L) 05/25/2021 0336   NA 142 07/12/2016 0000   K 3.2 (L) 05/25/2021 0336   CL 96 (L) 05/25/2021 0336   CO2 26 05/25/2021 0336   GLUCOSE 96 05/25/2021 0336   BUN 11 05/25/2021 0336   BUN 5 07/12/2016 0000   CREATININE 0.44 05/25/2021 0336   CALCIUM 8.4 (L) 05/25/2021 0336   PROT 6.0 (L) 05/25/2021 0336   ALBUMIN 3.5 05/25/2021 0336   AST 21 05/25/2021 0336   ALT 16 05/25/2021 0336   ALKPHOS 54 05/25/2021 0336   BILITOT 0.5 05/25/2021 0336   GFRNONAA >60 05/25/2021 0336   GFRAA >60 07/07/2016 0455   Lipase     Component Value Date/Time   LIPASE 24 06/19/2016 0739    Studies/Results: CT ABDOMEN PELVIS WO CONTRAST  Result Date:  05/24/2021 CLINICAL DATA:  Inpatient. Generalized abdominal pain, abdominal distension and excessive gas. EXAM: CT ABDOMEN AND PELVIS WITHOUT CONTRAST TECHNIQUE: Multidetector CT imaging of the abdomen and pelvis was performed following the standard protocol without IV contrast. COMPARISON:  05/23/2021 abdominal radiograph. 06/19/2016 CT abdomen/pelvis. FINDINGS: Lower chest: Indistinct 1.0 cm sub solid nodular opacity in the posterior left lower lobe (series 4/image 13). Coronary atherosclerosis. Hepatobiliary: Normal liver size. No liver mass. Cholelithiasis. No gallbladder wall thickening. No gallbladder distention. No biliary ductal dilatation. Pancreas: Scattered tiny calcifications throughout periphery of the pancreas, favor vascular calcifications. No pancreatic mass or significant duct dilation on these noncontrast images. Spleen: Normal size. No mass. Adrenals/Urinary Tract: Normal adrenals. No renal stones. No contour deforming renal masses. Mild fullness of the central right renal collecting system without overt right hydronephrosis. No left hydronephrosis. Small cystocele, unchanged. Otherwise normal bladder. Stomach/Bowel: Normal non-distended stomach. There are numerous moderately dilated mid small bowel loops with scattered small air-fluid levels throughout the abdomen and pelvis up to 4.7 cm diameter. The distal and terminal ileum are collapsed. No discrete small bowel caliber transition identified. No small bowel wall thickening or pneumatosis. Appendix not discretely visualized. No pericecal inflammatory changes. Moderate gas and mild stool throughout the large bowel with no large  bowel wall thickening. Mild sigmoid diverticulosis without associated wall thickening or significant pericolonic fat stranding. Vascular/Lymphatic: Atherosclerotic nonaneurysmal abdominal aorta. No pathologically enlarged lymph nodes in the abdomen or pelvis. Reproductive: Stable right deviated small uterus. No adnexal  masses. Other: No pneumoperitoneum. Small volume ascites. No focal fluid collection. Musculoskeletal: No aggressive appearing focal osseous lesions. Mild thoracolumbar spondylosis. IMPRESSION: 1. Several moderately dilated mid small bowel loops with small air-fluid levels throughout the abdomen and pelvis, with collapsed distal and terminal ileum. No discrete small bowel caliber transition identified. Findings are suggestive of a partial mechanical mid to distal small bowel obstruction, although the differential includes generalized adynamic ileus given moderate colonic gas. No bowel wall thickening or pneumatosis. No free air. 2. Small volume ascites. 3. Indistinct 1.0 cm subsolid nodular opacity in the posterior left lower lobe. Follow-up noncontrast chest CT recommended in 3 months. 4. Cholelithiasis. 5. Mild sigmoid diverticulosis. 6. Small cystocele. Fullness of the right renal collecting system without overt hydronephrosis. 7. Aortic Atherosclerosis (ICD10-I70.0). Electronically Signed   By: Ilona Sorrel M.D.   On: 05/24/2021 11:41   DG Abd 1 View  Result Date: 05/23/2021 CLINICAL DATA:  Pt complained of abdominal pain, nausea, and feeling the need to have a bowel movement but unable to pass any stool. Per nurse, pt has had Miralax, Senokot, Dulcolax suppository, and a Fleets enema with minimal results. EXAM: ABDOMEN - 1 VIEW COMPARISON:  06/25/2016. FINDINGS: There is generalized increased bowel gas, both in the colon and small bowel, without significant bowel dilation. No increased colonic stool. Soft tissues are poorly defined due to the overlying bowel gas. Aortoiliac atherosclerotic vascular calcifications are noted. No evidence of renal or ureteral stones. No acute skeletal abnormality. IMPRESSION: 1. No acute findings.  No evidence of bowel obstruction. 2. Generalized increased bowel gas, nonspecific. No increase in colonic stool burden. Electronically Signed   By: Lajean Manes M.D.   On:  05/23/2021 23:44   DG Abd Portable 1V  Result Date: 05/25/2021 CLINICAL DATA:  Small bowel obstruction EXAM: PORTABLE ABDOMEN - 1 VIEW COMPARISON:  05/24/2021 CT.  Plain film 05/23/2021. FINDINGS: Continued gaseous distention of bowel, similar to prior CT. No organomegaly or free air. No acute bony abnormality. Visualized lung bases clear. IMPRESSION: Continued gaseous distention of bowel, not significantly changed since prior study. Electronically Signed   By: Rolm Baptise M.D.   On: 05/25/2021 08:23    Anti-infectives: Anti-infectives (From admission, onward)    Start     Dose/Rate Route Frequency Ordered Stop   05/23/21 1000  remdesivir 100 mg in sodium chloride 0.9 % 100 mL IVPB       See Hyperspace for full Linked Orders Report.   100 mg 200 mL/hr over 30 Minutes Intravenous Daily 05/22/21 0444 05/27/21 0959   05/22/21 1100  cefTRIAXone (ROCEPHIN) 2 g in sodium chloride 0.9 % 100 mL IVPB        2 g 200 mL/hr over 30 Minutes Intravenous Daily 05/22/21 0939     05/22/21 0500  remdesivir 100 mg in sodium chloride 0.9 % 100 mL IVPB       See Hyperspace for full Linked Orders Report.   100 mg 200 mL/hr over 30 Minutes Intravenous Every 30 min 05/22/21 0444 05/22/21 0633   05/22/21 0000  cefTRIAXone (ROCEPHIN) 1 g injection        1 g Intramuscular  Once 05/22/21 0335 05/22/21 2359        Assessment/Plan Psbo vs ileus -CT scan reviewed.  X-ray this a.m. with continued SB distention.  She continues to feel distended and being nauseous. Given history of prior surgery (ex-lap, loa in 2017 by Dr. Ninfa Linden) this could be an adhesive SBO versus ileus in the setting of other disease processes.   - Will place NGT and start SBO protocol. - Keep K > 4 and Mg >2 for bowel function - Mobilize for bowel function - Hopefully will improve with conservative management. If does not improve with conservative management, may need exploration.   FEN - NPO, NGT, IVF VTE - SCDs, per TRH. Okay for  chemical prophylaxis from general surgery standpoint ID - Per TRH. None indicated from general surgery standpoint  COVID-19 COPD excerebration HTN Tobacco use LLL nodule    LOS: 3 days    Jillyn Ledger , [facility] 05/25/2021, 12:06 PM Please see Amion for pager number during day hours 7:00am-4:30pm

## 2021-05-25 NOTE — Progress Notes (Signed)
PROGRESS NOTE    Brenda Gibson  [id number] DOB: [birth year] DOA: 05/22/2021 PCP: Lance Sell, NP    No chief complaint on file.   Brief Narrative:  Patient is 80 year old female history of hypertension, schizoaffective disorder, COPD with ongoing tobacco use presented to the ED with shortness of breath with no improvement on home nebs with associated wheezing.  Chest x-ray done negative.  Patient noted to be hypoxic with sats in the upper 80s on room air on presentation.  COVID-19 PCR positive.  Patient admitted for acute COPD exacerbation   Assessment & Plan:   Principal Problem:   Acute hypoxemic respiratory failure due to COVID-19 [facility]) Active Problems:   COPD exacerbation (HCC)   Tobacco abuse   Essential hypertension, benign   Hypokalemia   COVID-19 virus infection   Hyperglycemia   Partial small bowel obstruction (HCC)  1 acute hypoxemic respiratory failure secondary to acute COPD exacerbation and COVID-19 infection. -Patient stated no significant change with shortness of breath. -Patient noted with sats of 88% on room air on presentation. -Continue IV remdesivir, Solu-Medrol 60 mg IV every 12 hours, Flonase, Mucinex, Claritin, PPI, scheduled Combivent, Dulera, vitamin C, zinc, IV Rocephin.   -Supportive care.   2.  COVID-19 infection -Patient presented worsening shortness of breath, noted to be hypoxic. -Chest x-ray negative for any acute infiltrate. -Continue IV remdesivir, IV Solu-Medrol every 12 hours, scheduled Combivent, Dulera, vitamin C, zinc.   -Follow inflammatory markers of CRP, D-dimer, ferritin.   3.  Acute COPD exacerbation -Likely triggered by COVID-19 infection. -Patient noted with sats of 88% on room air. -Continue IV Solu-Medrol every 12 hours, scheduled Combivent, Dulera, Mucinex, Claritin, PPI, IV Rocephin.   -Supportive care.   -Tobacco cessation stressed to patient.   -Nicotine patch.    4.  Partial small bowel obstruction versus  ileus -Patient with concerns for possible constipation per patient. -Patient tried multiple laxatives as well as suppositories and enema with no results. -Patient with a bout of emesis overnight and with ongoing nausea. -No emesis today. -Passing flatus.  No bowel movement -Abdominal films done with increased bowel gas with no evidence of bowel obstruction. -CT abdomen and pelvis done concerning for partial small bowel obstruction versus adynamic ileus. -Patient with prior history of laparotomy with lysis of adhesions. -Continue bowel rest.  -Continue current bowel regimen. -General surgery consulted and are following.   5.  Hypertension- -Blood pressure noted to be borderline/soft on admission and/antihypertensive medications on hold. -BP improved.  Follow.  6.  Hypokalemia -Magnesium level at 2 -Potassium at 3.2.   -We will place on potassium runs and place potassium and IV fluids.   -Follow.    7.  Hyperglycemia -Likely steroid-induced. -Hemoglobin A1c 6.3.  -SSI.  8.  Tobacco abuse -Tobacco cessation -Nicotine patch.  9.  ?? Constipation -Dulcolax suppository and fleets enema and bowel regimen of MiraLAX twice daily and Senokot S twice daily given with no results. -Abdominal films with concern for generalized increased bowel gas, no evidence of bowel obstruction. -CT abdomen and pelvis with concerns of partial small bowel obstruction. -Continue bowel regimen. -General surgery consulted and following. -Continue bowel rest   DVT prophylaxis: Lovenox Code Status: Full Family Communication: Updated patient.  No family at bedside. Disposition:   Status is: Inpatient  Remains inpatient appropriate because:IV treatments appropriate due to intensity of illness or inability to take PO  Dispo: The patient is from: Home  Anticipated d/c is to: Home              Patient currently is not medically stable to d/c.   Difficult to place patient No        Consultants:  General surgery: Dr. Ninfa Linden 05/24/2021  Procedures:  Chest x-ray 05/22/2021 Abdominal films 05/23/2021 CT abdomen and pelvis 05/24/2021  Antimicrobials:  IV Rocephin 05/22/2021>>>>   Subjective: Laying in bed.  No chest pain.  Feels shortness of breath is improving.  Still with some diffuse abdominal discomfort.  Patient also with some nausea but no emesis.  Passing some flatus.  No bowel movement.    Objective: Vitals:   05/24/21 1234 05/24/21 2004 05/25/21 0407 05/25/21 1129  BP: (!) 154/74 (!) 172/86 (!) 160/84 (!) 163/85  Pulse: 74 78 75 76  Resp: '18 20 18 16  '$ Temp: 98.7 F (37.1 C) 98.8 F (37.1 C) 98 F (36.7 C) 98.4 F (36.9 C)  TempSrc: Oral Oral Oral Oral  SpO2: 95% 97% 95% 92%  Weight:      Height:        Intake/Output Summary (Last 24 hours) at 05/25/2021 1151 Last data filed at 05/25/2021 1037 Gross per 24 hour  Intake 2653.87 ml  Output 1700 ml  Net 953.87 ml    Filed Weights   05/22/21 0207  Weight: 45.4 kg    Examination:  General exam: NAD Respiratory system: Diffuse coarse rhonchorous breath sounds.  Decreasing expiratory wheezings.  No crackles.  Fair air movement.  Speaking in full sentences.   Cardiovascular system: Regular rate rhythm no murmurs rubs or gallops.  No JVD.  No lower extremity edema.  Gastrointestinal system: Abdomen is soft, distended, hypoactive bowel sounds.  No rebound.  No guarding.  Central nervous system: Alert, oriented.  Moving extremities spontaneously.  No focal neurological deficits.   Extremities: Symmetric 5 x 5 power. Skin: No rashes, lesions or ulcers Psychiatry: Judgement and insight appear normal. Mood & affect appropriate.     Data Reviewed: I have personally reviewed following labs and imaging studies  CBC: Recent Labs  Lab 05/22/21 0220 05/22/21 0232 05/23/21 0319 05/24/21 0724 05/25/21 0336  WBC 3.8*  --  6.6 13.6* 11.9*  NEUTROABS 1.5*  --  5.6 11.9* 10.5*  HGB 13.7 14.6 12.0  12.2 13.0  HCT 41.7 43.0 37.9 38.3 40.2  MCV 89.3  --  88.8 88.0 87.0  PLT 188  --  173 224 238     Basic Metabolic Panel: Recent Labs  Lab 05/22/21 0232 05/22/21 1050 05/23/21 0319 05/24/21 0427 05/25/21 0336  NA 137 139 135 134* 130*  K 2.7* 4.2 4.3 4.3 3.2*  CL 98 102 107 105 96*  CO2  --  '23 22 24 26  '$ GLUCOSE 124* 261* 215* 136* 96  BUN 15 17 24* 19 11  CREATININE 0.50 0.65 0.65 0.41* 0.44  CALCIUM  --  8.9 8.8* 8.5* 8.4*  MG  --  2.6* 2.3  --  2.0  PHOS  --  2.7 3.2  --   --      GFR: Estimated Creatinine Clearance: 41.5 mL/min (by C-G formula based on SCr of 0.44 mg/dL).  Liver Function Tests: Recent Labs  Lab 05/23/21 0319 05/24/21 0427 05/25/21 0336  AST '20 17 21  '$ ALT '15 12 16  '$ ALKPHOS 56 52 54  BILITOT 0.3 0.4 0.5  PROT 6.1* 5.6* 6.0*  ALBUMIN 3.3* 3.1* 3.5     CBG: Recent Labs  Lab 05/24/21 1123  05/24/21 1641 05/25/21 0038 05/25/21 0620 05/25/21 1124  GLUCAP 155* 150* 183* 135* 126*      Recent Results (from the past 240 hour(s))  Resp Panel by RT-PCR (Flu A&B, Covid) Nasopharyngeal Swab     Status: Abnormal   Collection Time: 05/22/21  2:09 AM   Specimen: Nasopharyngeal Swab; Nasopharyngeal(NP) swabs in vial transport medium  Result Value Ref Range Status   SARS Coronavirus 2 by RT PCR POSITIVE (A) NEGATIVE Final    Comment: RESULT CALLED TO, READ BACK BY AND VERIFIED WITH: Leonides Sake, RN @ 0430 ON 05/22/21 C VARNER (NOTE) SARS-CoV-2 target nucleic acids are DETECTED.  The SARS-CoV-2 RNA is generally detectable in upper respiratory specimens during the acute phase of infection. Positive results are indicative of the presence of the identified virus, but do not rule out bacterial infection or co-infection with other pathogens not detected by the test. Clinical correlation with patient history and other diagnostic information is necessary to determine patient infection status. The expected result is Negative.  Fact Sheet for  Patients: EntrepreneurPulse.com.au  Fact Sheet for Healthcare Providers: IncredibleEmployment.be  This test is not yet approved or cleared by the [place] FDA and  has been authorized for detection and/or diagnosis of SARS-CoV-2 by FDA under an Emergency Use Authorization (EUA).  This EUA will remain in effect (meaning this test can  be used) for the duration of  the COVID-19 declaration under Section 564(b)(1) of the Act, 21 U.S.C. section 360bbb-3(b)(1), unless the authorization is terminated or revoked sooner.     Influenza A by PCR NEGATIVE NEGATIVE Final   Influenza B by PCR NEGATIVE NEGATIVE Final    Comment: (NOTE) The Xpert Xpress SARS-CoV-2/FLU/RSV plus assay is intended as an aid in the diagnosis of influenza from Nasopharyngeal swab specimens and should not be used as a sole basis for treatment. Nasal washings and aspirates are unacceptable for Xpert Xpress SARS-CoV-2/FLU/RSV testing.  Fact Sheet for Patients: EntrepreneurPulse.com.au  Fact Sheet for Healthcare Providers: IncredibleEmployment.be  This test is not yet approved or cleared by the [place] FDA and has been authorized for detection and/or diagnosis of SARS-CoV-2 by FDA under an Emergency Use Authorization (EUA). This EUA will remain in effect (meaning this test can be used) for the duration of the COVID-19 declaration under Section 564(b)(1) of the Act, 21 U.S.C. section 360bbb-3(b)(1), unless the authorization is terminated or revoked.  Performed at [facility], [place] [address] [place], [place] [address]   Urine Culture     Status: None   Collection Time: 05/23/21  3:41 PM   Specimen: Urine, Catheterized  Result Value Ref Range Status   Specimen Description   Final    URINE, CATHETERIZED Performed at [place] [address], [place], [place] [address]    Special Requests    Final    NONE Performed at [facility], [place] [address], [place], [place] [address]    Culture   Final    NO GROWTH Performed at [facility], [place] [address] [place], [place] [address]    Report Status 05/24/2021 FINAL  Final          Radiology Studies: CT ABDOMEN PELVIS WO CONTRAST  Result Date: 05/24/2021 CLINICAL DATA:  Inpatient. Generalized abdominal pain, abdominal distension and excessive gas. EXAM: CT ABDOMEN AND PELVIS WITHOUT CONTRAST TECHNIQUE: Multidetector CT imaging of the abdomen and pelvis was performed following the standard protocol without IV contrast. COMPARISON:  05/23/2021 abdominal radiograph. 06/19/2016  CT abdomen/pelvis. FINDINGS: Lower chest: Indistinct 1.0 cm sub solid nodular opacity in the posterior left lower lobe (series 4/image 13). Coronary atherosclerosis. Hepatobiliary: Normal liver size. No liver mass. Cholelithiasis. No gallbladder wall thickening. No gallbladder distention. No biliary ductal dilatation. Pancreas: Scattered tiny calcifications throughout periphery of the pancreas, favor vascular calcifications. No pancreatic mass or significant duct dilation on these noncontrast images. Spleen: Normal size. No mass. Adrenals/Urinary Tract: Normal adrenals. No renal stones. No contour deforming renal masses. Mild fullness of the central right renal collecting system without overt right hydronephrosis. No left hydronephrosis. Small cystocele, unchanged. Otherwise normal bladder. Stomach/Bowel: Normal non-distended stomach. There are numerous moderately dilated mid small bowel loops with scattered small air-fluid levels throughout the abdomen and pelvis up to 4.7 cm diameter. The distal and terminal ileum are collapsed. No discrete small bowel caliber transition identified. No small bowel wall thickening or pneumatosis. Appendix not discretely visualized. No pericecal inflammatory changes. Moderate gas and mild stool throughout the large  bowel with no large bowel wall thickening. Mild sigmoid diverticulosis without associated wall thickening or significant pericolonic fat stranding. Vascular/Lymphatic: Atherosclerotic nonaneurysmal abdominal aorta. No pathologically enlarged lymph nodes in the abdomen or pelvis. Reproductive: Stable right deviated small uterus. No adnexal masses. Other: No pneumoperitoneum. Small volume ascites. No focal fluid collection. Musculoskeletal: No aggressive appearing focal osseous lesions. Mild thoracolumbar spondylosis. IMPRESSION: 1. Several moderately dilated mid small bowel loops with small air-fluid levels throughout the abdomen and pelvis, with collapsed distal and terminal ileum. No discrete small bowel caliber transition identified. Findings are suggestive of a partial mechanical mid to distal small bowel obstruction, although the differential includes generalized adynamic ileus given moderate colonic gas. No bowel wall thickening or pneumatosis. No free air. 2. Small volume ascites. 3. Indistinct 1.0 cm subsolid nodular opacity in the posterior left lower lobe. Follow-up noncontrast chest CT recommended in 3 months. 4. Cholelithiasis. 5. Mild sigmoid diverticulosis. 6. Small cystocele. Fullness of the right renal collecting system without overt hydronephrosis. 7. Aortic Atherosclerosis (ICD10-I70.0). Electronically Signed   By: Ilona Sorrel M.D.   On: 05/24/2021 11:41   DG Abd 1 View  Result Date: 05/23/2021 CLINICAL DATA:  Pt complained of abdominal pain, nausea, and feeling the need to have a bowel movement but unable to pass any stool. Per nurse, pt has had Miralax, Senokot, Dulcolax suppository, and a Fleets enema with minimal results. EXAM: ABDOMEN - 1 VIEW COMPARISON:  06/25/2016. FINDINGS: There is generalized increased bowel gas, both in the colon and small bowel, without significant bowel dilation. No increased colonic stool. Soft tissues are poorly defined due to the overlying bowel gas. Aortoiliac  atherosclerotic vascular calcifications are noted. No evidence of renal or ureteral stones. No acute skeletal abnormality. IMPRESSION: 1. No acute findings.  No evidence of bowel obstruction. 2. Generalized increased bowel gas, nonspecific. No increase in colonic stool burden. Electronically Signed   By: Lajean Manes M.D.   On: 05/23/2021 23:44   DG Abd Portable 1V  Result Date: 05/25/2021 CLINICAL DATA:  Small bowel obstruction EXAM: PORTABLE ABDOMEN - 1 VIEW COMPARISON:  05/24/2021 CT.  Plain film 05/23/2021. FINDINGS: Continued gaseous distention of bowel, similar to prior CT. No organomegaly or free air. No acute bony abnormality. Visualized lung bases clear. IMPRESSION: Continued gaseous distention of bowel, not significantly changed since prior study. Electronically Signed   By: Rolm Baptise M.D.   On: 05/25/2021 08:23        Scheduled Meds:  (feeding supplement) PROSource Plus  30  mL Oral BID BM   vitamin C  500 mg Oral Daily   enoxaparin (LOVENOX) injection  30 mg Subcutaneous Q24H   fluticasone  2 spray Each Nare Daily   guaiFENesin  1,200 mg Oral BID   insulin aspart  0-9 Units Subcutaneous Q6H   Ipratropium-Albuterol  1 puff Inhalation Q6H   loratadine  10 mg Oral Daily   mouth rinse  15 mL Mouth Rinse BID   methylPREDNISolone (SOLU-MEDROL) injection  60 mg Intravenous BID   mometasone-formoterol  2 puff Inhalation BID   nicotine  14 mg Transdermal Daily   pantoprazole (PROTONIX) IV  40 mg Intravenous Daily   polyethylene glycol  17 g Oral BID   senna-docusate  1 tablet Oral BID   simethicone  160 mg Oral QID   sorbitol  30 mL Oral Once   zinc sulfate  220 mg Oral Daily   Continuous Infusions:  0.9 % NaCl with KCl 40 mEq / L 125 mL/hr at 05/25/21 1027   cefTRIAXone (ROCEPHIN)  IV 2 g (05/25/21 1031)   potassium chloride 10 mEq (05/25/21 1148)   remdesivir 100 mg in NS 100 mL 100 mg (05/25/21 1103)     LOS: 3 days    Time spent: 35 minutes     Irine Seal,  MD [facility]   To contact the attending provider between 7A-7P or the covering provider during after hours 7P-7A, please log into the web site [email or web address] and access using universal [place] password for that web site. If you do not have the password, please call the hospital operator.  05/25/2021, 11:51 AM

## 2021-05-25 NOTE — Progress Notes (Signed)
Talked to the pt earlier about an order for NG tube placement and the purpose , the contrast administration and abd xray. Pt understands very well and had one in the past. I came back to prepare placing the NG tube  and pt is refusing. She said she doesn't want to go though the same experience in the past when she had the NG tube. Alferd Apa PA notified and aware.

## 2021-05-26 ENCOUNTER — Inpatient Hospital Stay (HOSPITAL_COMMUNITY): Payer: Medicare Other

## 2021-05-26 DIAGNOSIS — I10 Essential (primary) hypertension: Secondary | ICD-10-CM | POA: Diagnosis not present

## 2021-05-26 DIAGNOSIS — U071 COVID-19: Secondary | ICD-10-CM | POA: Diagnosis not present

## 2021-05-26 DIAGNOSIS — R739 Hyperglycemia, unspecified: Secondary | ICD-10-CM | POA: Diagnosis not present

## 2021-05-26 DIAGNOSIS — J441 Chronic obstructive pulmonary disease with (acute) exacerbation: Secondary | ICD-10-CM | POA: Diagnosis not present

## 2021-05-26 LAB — COMPREHENSIVE METABOLIC PANEL
ALT: 20 U/L (ref 0–44)
AST: 28 U/L (ref 15–41)
Albumin: 3.2 g/dL — ABNORMAL LOW (ref 3.5–5.0)
Alkaline Phosphatase: 46 U/L (ref 38–126)
Anion gap: 10 (ref 5–15)
BUN: 12 mg/dL (ref 8–23)
CO2: 26 mmol/L (ref 22–32)
Calcium: 8.7 mg/dL — ABNORMAL LOW (ref 8.9–10.3)
Chloride: 101 mmol/L (ref 98–111)
Creatinine, Ser: 0.48 mg/dL (ref 0.44–1.00)
GFR, Estimated: >60 mL/min (ref >60)
Glucose, Bld: 118 mg/dL — ABNORMAL HIGH (ref 70–99)
Potassium: 4.4 mmol/L (ref 3.5–5.1)
Sodium: 137 mmol/L (ref 135–145)
Total Bilirubin: 0.6 mg/dL (ref 0.3–1.2)
Total Protein: 5.4 g/dL — ABNORMAL LOW (ref 6.5–8.1)

## 2021-05-26 LAB — CBC WITH DIFFERENTIAL/PLATELET
Abs Immature Granulocytes: 0.05 10*3/uL (ref 0.00–0.07)
Basophils Absolute: 0 10*3/uL (ref 0.0–0.1)
Basophils Relative: 0 %
Eosinophils Absolute: 0 10*3/uL (ref 0.0–0.5)
Eosinophils Relative: 0 %
HCT: 40.5 % (ref 36.0–46.0)
Hemoglobin: 13 g/dL (ref 12.0–15.0)
Immature Granulocytes: 1 %
Lymphocytes Relative: 8 %
Lymphs Abs: 0.8 10*3/uL (ref 0.7–4.0)
MCH: 28 pg (ref 26.0–34.0)
MCHC: 32.1 g/dL (ref 30.0–36.0)
MCV: 87.3 fL (ref 80.0–100.0)
Monocytes Absolute: 0.5 10*3/uL (ref 0.1–1.0)
Monocytes Relative: 5 %
Neutro Abs: 7.9 10*3/uL — ABNORMAL HIGH (ref 1.7–7.7)
Neutrophils Relative %: 86 %
Platelets: 233 10*3/uL (ref 150–400)
RBC: 4.64 MIL/uL (ref 3.87–5.11)
RDW: 14.3 % (ref 11.5–15.5)
WBC: 9.2 10*3/uL (ref 4.0–10.5)
nRBC: 0 % (ref 0.0–0.2)

## 2021-05-26 LAB — GLUCOSE, CAPILLARY
Glucose-Capillary: 128 mg/dL — ABNORMAL HIGH (ref 70–99)
Glucose-Capillary: 131 mg/dL — ABNORMAL HIGH (ref 70–99)
Glucose-Capillary: 136 mg/dL — ABNORMAL HIGH (ref 70–99)
Glucose-Capillary: 140 mg/dL — ABNORMAL HIGH (ref 70–99)
Glucose-Capillary: 143 mg/dL — ABNORMAL HIGH (ref 70–99)

## 2021-05-26 LAB — FERRITIN: Ferritin: 141 ng/mL (ref 11–307)

## 2021-05-26 LAB — BRAIN NATRIURETIC PEPTIDE: B Natriuretic Peptide: 543.9 pg/mL — ABNORMAL HIGH (ref 0.0–100.0)

## 2021-05-26 LAB — D-DIMER, QUANTITATIVE: D-Dimer, Quant: 0.42 ug/mL-FEU (ref 0.00–0.50)

## 2021-05-26 LAB — C-REACTIVE PROTEIN: CRP: 0.6 mg/dL (ref <1.0)

## 2021-05-26 LAB — MAGNESIUM: Magnesium: 1.9 mg/dL (ref 1.7–2.4)

## 2021-05-26 MED ORDER — LORAZEPAM 2 MG/ML IJ SOLN: 0.5000 mg | Freq: Two times a day (BID) | INTRAMUSCULAR | Status: DC | PRN

## 2021-05-26 MED ORDER — FUROSEMIDE 10 MG/ML IJ SOLN
20.0000 mg | Freq: Two times a day (BID) | INTRAMUSCULAR | Status: AC
  Administered 2021-05-26 (×2): 20 mg via INTRAVENOUS
  Filled 2021-05-26 (×2): qty 2

## 2021-05-26 MED ORDER — METOPROLOL TARTRATE 5 MG/5ML IV SOLN
2.5000 mg | Freq: Three times a day (TID) | INTRAVENOUS | Status: DC
  Administered 2021-05-26 – 2021-05-31 (×14): 2.5 mg via INTRAVENOUS
  Filled 2021-05-26 (×15): qty 5

## 2021-05-26 NOTE — Progress Notes (Signed)
PROGRESS NOTE    Brenda Gibson  [id number] DOB: 1941-09-08 DOA: 05/22/2021 PCP: Lance Sell, NP    No chief complaint on file.   Brief Narrative:  Patient is 80 year old female history of hypertension, schizoaffective disorder, COPD with ongoing tobacco use presented to the ED with shortness of breath with no improvement on home nebs with associated wheezing.  Chest x-ray done negative.  Patient noted to be hypoxic with sats in the upper 80s on room air on presentation.  COVID-19 PCR positive.  Patient admitted for acute COPD exacerbation   Assessment & Plan:   Principal Problem:   Acute hypoxemic respiratory failure due to COVID-19 [facility]) Active Problems:   COPD exacerbation (HCC)   Tobacco abuse   Essential hypertension, benign   Hypokalemia   COVID-19 virus infection   Hyperglycemia   Partial small bowel obstruction (HCC)  1 acute hypoxemic respiratory failure secondary to acute COPD exacerbation and COVID-19 infection. -Patient stated no significant change with shortness of breath. -Patient noted with sats of 88% on room air on presentation. -Patient with complaints of shortness of breath this morning. -BNP elevated at 543.9. -Check a chest x-ray. -Check a 2D echo. -Lasix 20 mg IV every 12 hours x2 doses. -Continue Solu-Medrol 60 mg IV every 12 hours, IV remdesivir, Flonase, Mucinex, Claritin, PPI, scheduled Combivent, Dulera, vitamin C, zinc, IV Rocephin.  -Supportive care.   2.  COVID-19 infection -Patient presented worsening shortness of breath, noted to be hypoxic. -Chest x-ray negative for any acute infiltrate. -With complaints of shortness of breath this morning . -Check a chest x-ray.   -BNP noted to be elevated at 543.9.   -Lasix 20 mg IV every 12 hours x2 doses  -Continue IV remdesivir, IV Solu-Medrol every 12 hours, scheduled Combivent, Dulera, vitamin C, zinc.   -Follow inflammatory markers of CRP, D-dimer, ferritin.    3.  Acute COPD  exacerbation -Likely triggered by COVID-19 infection. -Patient noted with sats of 88% on room air. -Continue IV Solu-Medrol every 12 hours, scheduled Combivent, Dulera, Mucinex, Claritin, PPI, IV Rocephin.   -Supportive care.   -Tobacco cessation stressed to patient.   -Nicotine patch.    4.  Partial small bowel obstruction versus ileus -Patient with concerns for possible constipation per patient. -Patient tried multiple laxatives as well as suppositories and enema with no results. -Patient with a bout of emesis the evening of 05/24/2021, and with ongoing nausea. -No emesis today. -Passing flatus.  No bowel movement -Abdominal films done with increased bowel gas with no evidence of bowel obstruction. -CT abdomen and pelvis done concerning for partial small bowel obstruction versus adynamic ileus. -Patient with prior history of laparotomy with lysis of adhesions. -Continue bowel rest.  -Continue current bowel regimen. -General surgery consulted and are following.   5.  Hypertension- -Blood pressure noted to be borderline/soft on admission and/antihypertensive medications on hold. -BP improved.   -Currently NPO.   -Place on Lopressor 2.5 mg IV every 8 hours.    6.  Hypokalemia -Repleted.   -Potassium of 4.4, magnesium at 1.9.   7.  Hyperglycemia -Likely steroid-induced. -Hemoglobin A1c 6.3.  -CBG 131 this morning. -SSI.  8.  Tobacco abuse -Tobacco cessation.   -Nicotine patch.  9.  ?? Constipation -Dulcolax suppository and fleets enema and bowel regimen of MiraLAX twice daily and Senokot S twice daily given with no results. -Abdominal films with concern for generalized increased bowel gas, no evidence of bowel obstruction. -CT abdomen and pelvis with concerns of partial  small bowel obstruction. -Continue bowel regimen as per general surgery. -General surgery consulted and following. -Continue bowel rest   DVT prophylaxis: Lovenox Code Status: Full Family  Communication: Updated patient.  No family at bedside. Disposition:   Status is: Inpatient  Remains inpatient appropriate because:IV treatments appropriate due to intensity of illness or inability to take PO  Dispo: The patient is from: Home              Anticipated d/c is to: Home              Patient currently is not medically stable to d/c.   Difficult to place patient No       Consultants:  General surgery: Dr. Ninfa Linden 05/24/2021  Procedures:  Chest x-ray 05/22/2021 Abdominal films 05/23/2021 CT abdomen and pelvis 05/24/2021  Antimicrobials:  IV Rocephin 05/22/2021>>>>   Subjective: C/o SOB. No CP. Nausea, no emesis. + flatus. No BM.  NG tube ordered per general surgery however patient noted to have refused.  Objective: Vitals:   05/25/21 2127 05/26/21 0502 05/26/21 0736 05/26/21 0801  BP: (!) 151/86 139/69    Pulse: 64 92    Resp: 18 20    Temp: 98.3 F (36.8 C) (!) 97.5 F (36.4 C)    TempSrc: Oral Oral    SpO2: 92% (!) 89% 90%   Weight:    43.7 kg  Height:    '5\' 5"'$  (1.651 m)    Intake/Output Summary (Last 24 hours) at 05/26/2021 1030 Last data filed at 05/26/2021 1024 Gross per 24 hour  Intake 2090 ml  Output 2150 ml  Net -60 ml    Filed Weights   05/22/21 0207 05/26/21 0801  Weight: 45.4 kg 43.7 kg    Examination:  General exam: NAD Respiratory system: Decreased BS in basas.  Decreased expiratory wheezing.  Fair air movement.  Speaking in full sentences. Cardiovascular system: RRR no murmurs rubs or gallops.  No JVD.  No lower extremity edema.  Gastrointestinal system: Abdomen is soft, distended, decreased diffuse tenderness to palpation, hypoactive bowel sounds.  No rebound.  No guarding.   Central nervous system: Alert, oriented.  No focal neurological deficits.   Extremities: Symmetric 5 x 5 power. Skin: No rashes, lesions or ulcers Psychiatry: Judgement and insight appear normal. Mood & affect appropriate.     Data Reviewed: I have  personally reviewed following labs and imaging studies  CBC: Recent Labs  Lab 05/22/21 0220 05/22/21 0232 05/23/21 0319 05/24/21 0724 05/25/21 0336 05/26/21 0413  WBC 3.8*  --  6.6 13.6* 11.9* 9.2  NEUTROABS 1.5*  --  5.6 11.9* 10.5* 7.9*  HGB 13.7 14.6 12.0 12.2 13.0 13.0  HCT 41.7 43.0 37.9 38.3 40.2 40.5  MCV 89.3  --  88.8 88.0 87.0 87.3  PLT 188  --  173 224 238 233     Basic Metabolic Panel: Recent Labs  Lab 05/22/21 1050 05/23/21 0319 05/24/21 0427 05/25/21 0336 05/26/21 0413  NA 139 135 134* 130* 137  K 4.2 4.3 4.3 3.2* 4.4  CL 102 107 105 96* 101  CO2 '23 22 24 26 26  '$ GLUCOSE 261* 215* 136* 96 118*  BUN 17 24* '19 11 12  '$ CREATININE 0.65 0.65 0.41* 0.44 0.48  CALCIUM 8.9 8.8* 8.5* 8.4* 8.7*  MG 2.6* 2.3  --  2.0 1.9  PHOS 2.7 3.2  --   --   --      GFR: Estimated Creatinine Clearance: 40 mL/min (by C-G formula based on  SCr of 0.48 mg/dL).  Liver Function Tests: Recent Labs  Lab 05/23/21 0319 05/24/21 0427 05/25/21 0336 05/26/21 0413  AST '20 17 21 28  '$ ALT '15 12 16 20  '$ ALKPHOS 56 52 54 46  BILITOT 0.3 0.4 0.5 0.6  PROT 6.1* 5.6* 6.0* 5.4*  ALBUMIN 3.3* 3.1* 3.5 3.2*     CBG: Recent Labs  Lab 05/25/21 0620 05/25/21 1124 05/25/21 1635 05/26/21 0005 05/26/21 0455  GLUCAP 135* 126* 122* 128* 131*      Recent Results (from the past 240 hour(s))  Resp Panel by RT-PCR (Flu A&B, Covid) Nasopharyngeal Swab     Status: Abnormal   Collection Time: 05/22/21  2:09 AM   Specimen: Nasopharyngeal Swab; Nasopharyngeal(NP) swabs in vial transport medium  Result Value Ref Range Status   SARS Coronavirus 2 by RT PCR POSITIVE (A) NEGATIVE Final    Comment: RESULT CALLED TO, READ BACK BY AND VERIFIED WITH: Leonides Sake, RN @ 0430 ON 05/22/21 C VARNER (NOTE) SARS-CoV-2 target nucleic acids are DETECTED.  The SARS-CoV-2 RNA is generally detectable in upper respiratory specimens during the acute phase of infection. Positive results are indicative of the  presence of the identified virus, but do not rule out bacterial infection or co-infection with other pathogens not detected by the test. Clinical correlation with patient history and other diagnostic information is necessary to determine patient infection status. The expected result is Negative.  Fact Sheet for Patients: EntrepreneurPulse.com.au  Fact Sheet for Healthcare Providers: IncredibleEmployment.be  This test is not yet approved or cleared by the [place] FDA and  has been authorized for detection and/or diagnosis of SARS-CoV-2 by FDA under an Emergency Use Authorization (EUA).  This EUA will remain in effect (meaning this test can  be used) for the duration of  the COVID-19 declaration under Section 564(b)(1) of the Act, 21 U.S.C. section 360bbb-3(b)(1), unless the authorization is terminated or revoked sooner.     Influenza A by PCR NEGATIVE NEGATIVE Final   Influenza B by PCR NEGATIVE NEGATIVE Final    Comment: (NOTE) The Xpert Xpress SARS-CoV-2/FLU/RSV plus assay is intended as an aid in the diagnosis of influenza from Nasopharyngeal swab specimens and should not be used as a sole basis for treatment. Nasal washings and aspirates are unacceptable for Xpert Xpress SARS-CoV-2/FLU/RSV testing.  Fact Sheet for Patients: EntrepreneurPulse.com.au  Fact Sheet for Healthcare Providers: IncredibleEmployment.be  This test is not yet approved or cleared by the [place] FDA and has been authorized for detection and/or diagnosis of SARS-CoV-2 by FDA under an Emergency Use Authorization (EUA). This EUA will remain in effect (meaning this test can be used) for the duration of the COVID-19 declaration under Section 564(b)(1) of the Act, 21 U.S.C. section 360bbb-3(b)(1), unless the authorization is terminated or revoked.  Performed at [facility], [place] [address], [place], [place] [address]   Urine Culture     Status: None   Collection Time: 05/23/21  3:41 PM   Specimen: Urine, Catheterized  Result Value Ref Range Status   Specimen Description   Final    URINE, CATHETERIZED Performed at [facility] [place] [address] [place], [place] [address]    Special Requests   Final    NONE Performed at [facility], [place] [address], [place], [place] [address]    Culture   Final    NO GROWTH Performed at [facility], [place] [address] [place], [place] [address]    Report Status  05/24/2021 FINAL  Final          Radiology Studies: DG CHEST PORT 1 VIEW  Result Date: 05/26/2021 CLINICAL DATA:  Small bowel obstruction. EXAM: PORTABLE CHEST 1 VIEW COMPARISON:  05/22/2021 FINDINGS: Single view of the chest again demonstrates some hyperinflation of the lungs. Heart size is within normal limits and stable. Atherosclerotic calcifications at the aortic arch. Few densities in the right lower chest could represent overlying structures. No significant airspace disease or lung consolidation. Lucency underneath the right hemidiaphragm is most compatible with bowel gas. IMPRESSION: No acute chest findings. Electronically Signed   By: Markus Daft M.D.   On: 05/26/2021 10:16   DG Abd Portable 1V  Result Date: 05/25/2021 CLINICAL DATA:  Small bowel obstruction EXAM: PORTABLE ABDOMEN - 1 VIEW COMPARISON:  05/24/2021 CT.  Plain film 05/23/2021. FINDINGS: Continued gaseous distention of bowel, similar to prior CT. No organomegaly or free air. No acute bony abnormality. Visualized lung bases clear. IMPRESSION: Continued gaseous distention of bowel, not significantly changed since prior study. Electronically Signed   By: Rolm Baptise M.D.   On: 05/25/2021 08:23   DG Abd Portable 2V  Result Date: 05/26/2021 CLINICAL DATA:  Encounter for small bowel obstruction. EXAM: PORTABLE ABDOMEN - 2 VIEW COMPARISON:  05/25/2021 FINDINGS: Large  amount of bowel gas throughout the abdomen and pelvis. Lucency underneath the right hemidiaphragm is most compatible with dilated loops of bowel gas. The degree of bowel gas distension is similar to the previous examination. No evidence for free air. Degenerative changes in the left hip joint. IMPRESSION: 1. Persistent dilated gas-filled loops of bowel throughout the abdomen. No significant change from 05/25/2021. Findings remain concerning for a bowel obstruction. Electronically Signed   By: Markus Daft M.D.   On: 05/26/2021 10:19        Scheduled Meds:  (feeding supplement) PROSource Plus  30 mL Oral BID BM   vitamin C  500 mg Oral Daily   diatrizoate meglumine-sodium  90 mL Per NG tube Once   enoxaparin (LOVENOX) injection  30 mg Subcutaneous Q24H   fluticasone  2 spray Each Nare Daily   furosemide  20 mg Intravenous Q12H   guaiFENesin  1,200 mg Oral BID   insulin aspart  0-9 Units Subcutaneous Q6H   Ipratropium-Albuterol  1 puff Inhalation Q6H   loratadine  10 mg Oral Daily   mouth rinse  15 mL Mouth Rinse BID   methylPREDNISolone (SOLU-MEDROL) injection  60 mg Intravenous BID   mometasone-formoterol  2 puff Inhalation BID   nicotine  14 mg Transdermal Daily   pantoprazole (PROTONIX) IV  40 mg Intravenous Daily   polyethylene glycol  17 g Oral BID   senna-docusate  1 tablet Oral BID   simethicone  160 mg Oral QID   sorbitol  30 mL Oral Once   zinc sulfate  220 mg Oral Daily   Continuous Infusions:  cefTRIAXone (ROCEPHIN)  IV 2 g (05/25/21 1031)   remdesivir 100 mg in NS 100 mL 100 mg (05/26/21 1008)     LOS: 4 days    Time spent: 35 minutes     Irine Seal, MD [facility]   To contact the attending provider between 7A-7P or the covering provider during after hours 7P-7A, please log into the web site [email or web address] and access using universal [place] password for that web site. If you do not have the password, please call the hospital  operator.  05/26/2021, 10:30 AM

## 2021-05-26 NOTE — Progress Notes (Signed)
Physical Therapy Treatment Patient Details Name: Brenda Gibson MRN: [id number] DOB: 1941-01-17 Today's Date: 05/26/2021    History of Present Illness Patient is a 80 year old female who presented to the ED with shortness of breath. patient was found to have acute hypoxic respiratory failure, COVID 19, and COPD exacerbation. Pt also with Psbo vs ileus and pending NG tube.  PMH:HTN, schizoaffective disorder, and COPD.    PT Comments    Pt assisted with ambulating in room and required at least 4L O2 [place] however SPO2 83-89% with activity. RN notified and aware.  Pt fatigued quickly and returned to bed.  Pt pending NG possibly today.   Follow Up Recommendations  SNF;Supervision for mobility/OOB     Equipment Recommendations  None recommended by PT    Recommendations for Other Services       Precautions / Restrictions Precautions Precautions: Fall Precaution Comments: monitor O2    Mobility  Bed Mobility Overal bed mobility: Modified Independent             General bed mobility comments: use of bed rails with increased time    Transfers Overall transfer level: Needs assistance Equipment used: None Transfers: Sit to/from Stand Sit to Stand: Min guard            Ambulation/Gait Ambulation/Gait assistance: Min guard Gait Distance (Feet): 60 Feet (total) Assistive device: None Gait Pattern/deviations: Step-through pattern;Trunk flexed Gait velocity: decr   General Gait Details: SpO2 89% on 3L O2 at rest, dropped to 83% on 3L with ambulation, improved to 95% on 4L with breathing and short standing rest break; however 84% on 4L O2 [place] upon return to bed, cues for pursed lip breathing; pt left on 4L O2 [place] with RN aware as SpO2 only improving to 88%   Stairs             Wheelchair Mobility    Modified Rankin (Stroke Patients Only)       Balance                                            Cognition Arousal/Alertness:  Awake/alert Behavior During Therapy: WFL for tasks assessed/performed Overall Cognitive Status: Within Functional Limits for tasks assessed                                        Exercises      General Comments        Pertinent Vitals/Pain      Home Living                      Prior Function            PT Goals (current goals can now be found in the care plan section) Progress towards PT goals: Progressing toward goals    Frequency    Min 2X/week      PT Plan Current plan remains appropriate    Co-evaluation              AM-PAC PT "6 Clicks" Mobility   Outcome Measure  Help needed turning from your back to your side while in a flat bed without using bedrails?: None Help needed moving from lying on your back to sitting on the side of a flat bed  without using bedrails?: A Little Help needed moving to and from a bed to a chair (including a wheelchair)?: A Little Help needed standing up from a chair using your arms (e.g., wheelchair or bedside chair)?: A Little Help needed to walk in hospital room?: A Little Help needed climbing 3-5 steps with a railing? : A Lot 6 Click Score: 18    End of Session Equipment Utilized During Treatment: Oxygen Activity Tolerance: Patient tolerated treatment well Patient left: in bed;with call bell/phone within reach Nurse Communication: Mobility status (aware of O2 sats and left on 4L) PT Visit Diagnosis: Other abnormalities of gait and mobility (R26.89);Muscle weakness (generalized) (M62.81)     Time: [id number] PT Time Calculation (min) (ACUTE ONLY): 17 min  Charges:  $Gait Training: 8-22 mins                     Jannette Spanner PT, DPT Acute Rehabilitation Services Pager: [phone] Office: [phone]    York Ram E 05/26/2021, 12:12 PM

## 2021-05-26 NOTE — Progress Notes (Signed)
Patient had told surgical PA Legrand Como that she would allow placement of NG tube today, when nurse went in and got ready to place the tube patient refused saying "I don't want it".  Patient has had NG tube in the past and understands what needs to be done and why.  Page sent to Psychologist, sport and exercise.

## 2021-05-26 NOTE — Progress Notes (Signed)
Subjective: CC: Had some nausea yesterday. None this am. Passing some flatus. No BM. Still feels distended.   Objective: Vital signs in last 24 hours: Temp:  [97.5 F (36.4 C)-98.4 F (36.9 C)] 97.5 F (36.4 C) (07/26 0502) Pulse Rate:  [64-92] 92 (07/26 0502) Resp:  [16-20] 20 (07/26 0502) BP: (139-163)/(69-86) 139/69 (07/26 0502) SpO2:  [89 %-92 %] 90 % (07/26 0736) Weight:  [43.7 kg] 43.7 kg (07/26 0801) Last BM Date: 05/23/21  Intake/Output from previous day: 07/25 0701 - 07/26 0700 In: 2090 [I.V.:2000; IV Piggyback:90] Out: [id number] [Urine:1850] Intake/Output this shift: No intake/output data recorded.  PE: Gen:  Alert, NAD, pleasant Cards: Reg Pulm: normal rate and effort  Abd: Soft, moderate distension, NT, hypoactive bowel sounds Psych: A&Ox3 Skin: no rashes noted, warm and dry    Lab Results:  Recent Labs    05/25/21 0336 05/26/21 0413  WBC 11.9* 9.2  HGB 13.0 13.0  HCT 40.2 40.5  PLT 238 233   BMET Recent Labs    05/25/21 0336 05/26/21 0413  NA 130* 137  K 3.2* 4.4  CL 96* 101  CO2 26 26  GLUCOSE 96 118*  BUN 11 12  CREATININE 0.44 0.48  CALCIUM 8.4* 8.7*   PT/INR No results for input(s): LABPROT, INR in the last 72 hours. CMP     Component Value Date/Time   NA 137 05/26/2021 0413   NA 142 07/12/2016 0000   K 4.4 05/26/2021 0413   CL 101 05/26/2021 0413   CO2 26 05/26/2021 0413   GLUCOSE 118 (H) 05/26/2021 0413   BUN 12 05/26/2021 0413   BUN 5 07/12/2016 0000   CREATININE 0.48 05/26/2021 0413   CALCIUM 8.7 (L) 05/26/2021 0413   PROT 5.4 (L) 05/26/2021 0413   ALBUMIN 3.2 (L) 05/26/2021 0413   AST 28 05/26/2021 0413   ALT 20 05/26/2021 0413   ALKPHOS 46 05/26/2021 0413   BILITOT 0.6 05/26/2021 0413   GFRNONAA >60 05/26/2021 0413   GFRAA >60 07/07/2016 0455   Lipase     Component Value Date/Time   LIPASE 24 06/19/2016 0739    Studies/Results: CT ABDOMEN PELVIS WO CONTRAST  Result Date: 05/24/2021 CLINICAL DATA:   Inpatient. Generalized abdominal pain, abdominal distension and excessive gas. EXAM: CT ABDOMEN AND PELVIS WITHOUT CONTRAST TECHNIQUE: Multidetector CT imaging of the abdomen and pelvis was performed following the standard protocol without IV contrast. COMPARISON:  05/23/2021 abdominal radiograph. 06/19/2016 CT abdomen/pelvis. FINDINGS: Lower chest: Indistinct 1.0 cm sub solid nodular opacity in the posterior left lower lobe (series 4/image 13). Coronary atherosclerosis. Hepatobiliary: Normal liver size. No liver mass. Cholelithiasis. No gallbladder wall thickening. No gallbladder distention. No biliary ductal dilatation. Pancreas: Scattered tiny calcifications throughout periphery of the pancreas, favor vascular calcifications. No pancreatic mass or significant duct dilation on these noncontrast images. Spleen: Normal size. No mass. Adrenals/Urinary Tract: Normal adrenals. No renal stones. No contour deforming renal masses. Mild fullness of the central right renal collecting system without overt right hydronephrosis. No left hydronephrosis. Small cystocele, unchanged. Otherwise normal bladder. Stomach/Bowel: Normal non-distended stomach. There are numerous moderately dilated mid small bowel loops with scattered small air-fluid levels throughout the abdomen and pelvis up to 4.7 cm diameter. The distal and terminal ileum are collapsed. No discrete small bowel caliber transition identified. No small bowel wall thickening or pneumatosis. Appendix not discretely visualized. No pericecal inflammatory changes. Moderate gas and mild stool throughout the large bowel with no large bowel wall thickening.  Mild sigmoid diverticulosis without associated wall thickening or significant pericolonic fat stranding. Vascular/Lymphatic: Atherosclerotic nonaneurysmal abdominal aorta. No pathologically enlarged lymph nodes in the abdomen or pelvis. Reproductive: Stable right deviated small uterus. No adnexal masses. Other: No  pneumoperitoneum. Small volume ascites. No focal fluid collection. Musculoskeletal: No aggressive appearing focal osseous lesions. Mild thoracolumbar spondylosis. IMPRESSION: 1. Several moderately dilated mid small bowel loops with small air-fluid levels throughout the abdomen and pelvis, with collapsed distal and terminal ileum. No discrete small bowel caliber transition identified. Findings are suggestive of a partial mechanical mid to distal small bowel obstruction, although the differential includes generalized adynamic ileus given moderate colonic gas. No bowel wall thickening or pneumatosis. No free air. 2. Small volume ascites. 3. Indistinct 1.0 cm subsolid nodular opacity in the posterior left lower lobe. Follow-up noncontrast chest CT recommended in 3 months. 4. Cholelithiasis. 5. Mild sigmoid diverticulosis. 6. Small cystocele. Fullness of the right renal collecting system without overt hydronephrosis. 7. Aortic Atherosclerosis (ICD10-I70.0). Electronically Signed   By: Ilona Sorrel M.D.   On: 05/24/2021 11:41   DG Abd Portable 1V  Result Date: 05/25/2021 CLINICAL DATA:  Small bowel obstruction EXAM: PORTABLE ABDOMEN - 1 VIEW COMPARISON:  05/24/2021 CT.  Plain film 05/23/2021. FINDINGS: Continued gaseous distention of bowel, similar to prior CT. No organomegaly or free air. No acute bony abnormality. Visualized lung bases clear. IMPRESSION: Continued gaseous distention of bowel, not significantly changed since prior study. Electronically Signed   By: Rolm Baptise M.D.   On: 05/25/2021 08:23    Anti-infectives: Anti-infectives (From admission, onward)    Start     Dose/Rate Route Frequency Ordered Stop   05/23/21 1000  remdesivir 100 mg in sodium chloride 0.9 % 100 mL IVPB       See Hyperspace for full Linked Orders Report.   100 mg 200 mL/hr over 30 Minutes Intravenous Daily 05/22/21 0444 05/27/21 0959   05/22/21 1100  cefTRIAXone (ROCEPHIN) 2 g in sodium chloride 0.9 % 100 mL IVPB        2  g 200 mL/hr over 30 Minutes Intravenous Daily 05/22/21 0939     05/22/21 0500  remdesivir 100 mg in sodium chloride 0.9 % 100 mL IVPB       See Hyperspace for full Linked Orders Report.   100 mg 200 mL/hr over 30 Minutes Intravenous Every 30 min 05/22/21 0444 05/22/21 0633   05/22/21 0000  cefTRIAXone (ROCEPHIN) 1 g injection        1 g Intramuscular  Once 05/22/21 0335 05/22/21 2359        Assessment/Plan Psbo vs ileus -CT scan reviewed.  X-ray this a.m. with continued SB distention.  She continues to feel distended and intermittently nauseous also she is passing some flatus. Given history of prior surgery (ex-lap, loa in 2017 by Dr. Ninfa Linden) this could be an adhesive pSBO versus ileus in the setting of other disease processes.   - Will place NGT and start SBO protocol. Patient agreebale to NGT placement today, I let her RN know - Keep K > 4 and Mg >2 for bowel function - Mobilize for bowel function - Hopefully will improve with conservative management. If does not improve with conservative management, may need exploration.    FEN - NPO, NGT, IVF VTE - SCDs, Lovenox  ID - Per TRH. None indicated from general surgery standpoint   - Per TRH -  COVID-19 COPD excerebration HTN Tobacco use LLL nodule    LOS: 4 days  Jillyn Ledger , [facility] 05/26/2021, 9:16 AM Please see Amion for pager number during day hours 7:00am-4:30pm

## 2021-05-26 NOTE — Plan of Care (Signed)

## 2021-05-26 NOTE — Progress Notes (Signed)
Occupational Therapy Treatment Patient Details Name: Brenda Gibson MRN: [id number] DOB: 10-03-1941 Today's Date: 05/26/2021    History of present illness Patient is a 80 year old female who presented to the ED with shortness of breath. patient was found to have acute hypoxic respiratory failure, COVID 19, and COPD exacerbation. Pt also with Psbo vs ileus and pending NG tube.  PMH:HTN, schizoaffective disorder, and COPD.   OT comments  Patient was noted to require increased O2 level than last session. Patient is currently on continuous 4L/min. Patient was educated on O2 cord management. Patient verbalized understanding but was noted to require continued education with poor memory and carryover demonstrated. Patient was unable to teach back strategies at end of session despite continued education during session. Patient was able to complete bed mobility and functional mobility in room with min guard and O2 cord management with noted drop in O2 saturation to 85% on 4L/min. Patient was able to deep breath O2 saturation back up to 90% on 4L/min with rest and increased time. Patient tolerated session well. Patient to continue skilled OT services while in acute care.   Follow Up Recommendations  No OT follow up    Equipment Recommendations  Other (comment)    Recommendations for Other Services      Precautions / Restrictions Precautions Precautions: Fall Precaution Comments: monitor O2       Mobility Bed Mobility Overal bed mobility: Modified Independent             General bed mobility comments: use of bed rails with increased time    Transfers Overall transfer level: Needs assistance Equipment used: None Transfers: Sit to/from Stand Sit to Stand: Min guard         General transfer comment: with education on O2 cord management    Balance                                           ADL either performed or assessed with clinical judgement   ADL Overall  ADL's : Needs assistance/impaired                         Toilet Transfer: Min Psychiatric nurse Details (indicate cue type and reason): with O2 cord management. patient was educated on how to manage O2 cord with patient unable to carryover education or remember at end of session with continued cues.           General ADL Comments: patient was able to complete bed mobility with MI with education on O2 cord management. patient verbalized understanding but continuied to require cues for same tasks during session. patients O2 saturation was noted to drop to 85% on 4L/min with education on deep breathing with 1 min 43 seconds to deep breath back up. nursing made aware.     Vision   Vision Assessment?: No apparent visual deficits   Perception     Praxis      Cognition Arousal/Alertness: Awake/alert Behavior During Therapy: WFL for tasks assessed/performed Overall Cognitive Status: Within Functional Limits for tasks assessed                                          Exercises     Shoulder Instructions  General Comments      Pertinent Vitals/ Pain       Pain Assessment: No/denies pain  Home Living                                          Prior Functioning/Environment              Frequency  Min 1X/week        Progress Toward Goals  OT Goals(current goals can now be found in the care plan section)  Progress towards OT goals: OT to reassess next treatment (patient was on 2L/min in ED with patient increaed to 4L/min with decreased ability to maintain O2 saturation with activity.)  Acute Rehab OT Goals Patient Stated Goal: to go home OT Goal Formulation: With patient Time For Goal Achievement: 05/29/21 Potential to Achieve Goals: Good ADL Goals Additional ADL Goal #1: patient to complete O2 cord management during ADLs with MI to transition home Additional ADL Goal #2: patient to be able to complete UB and LB  bathing tasks while maintaining O2 saturation above 90%  Plan Discharge plan remains appropriate    Co-evaluation                 AM-PAC OT "6 Clicks" Daily Activity     Outcome Measure   Help from another person eating meals?: None Help from another person taking care of personal grooming?: None   Help from another person bathing (including washing, rinsing, drying)?: A Little (O2 cord management to complete tasks.) Help from another person to put on and taking off regular upper body clothing?: None Help from another person to put on and taking off regular lower body clothing?: None 6 Click Score: 19    End of Session Equipment Utilized During Treatment: Gait belt  OT Visit Diagnosis: History of falling (Z91.81)   Activity Tolerance Patient tolerated treatment well   Patient Left in bed;with call bell/phone within reach   Nurse Communication Other (comment) (O2 saturation)        Time: [id number] OT Time Calculation (min): 23 min  Charges: OT General Charges $OT Visit: 1 Visit OT Treatments $Self Care/Home Management : 23-37 mins  Jackelyn Poling OTR/L, MS Acute Rehabilitation Department Office# [phone] Pager# [place] 05/26/2021, 4:54 PM

## 2021-05-27 ENCOUNTER — Inpatient Hospital Stay (HOSPITAL_COMMUNITY): Payer: Medicare Other

## 2021-05-27 DIAGNOSIS — R0609 Other forms of dyspnea: Secondary | ICD-10-CM

## 2021-05-27 DIAGNOSIS — U071 COVID-19: Secondary | ICD-10-CM | POA: Diagnosis not present

## 2021-05-27 DIAGNOSIS — J9601 Acute respiratory failure with hypoxia: Secondary | ICD-10-CM | POA: Diagnosis not present

## 2021-05-27 LAB — ECHOCARDIOGRAM COMPLETE
Area-P 1/2: 3.27 cm2
Height: 65 in
S' Lateral: 2.3 cm
Weight: 1541 oz

## 2021-05-27 LAB — CBC WITH DIFFERENTIAL/PLATELET
Abs Immature Granulocytes: 0.08 10*3/uL — ABNORMAL HIGH (ref 0.00–0.07)
Basophils Absolute: 0 10*3/uL (ref 0.0–0.1)
Basophils Relative: 0 %
Eosinophils Absolute: 0 10*3/uL (ref 0.0–0.5)
Eosinophils Relative: 0 %
HCT: 40.2 % (ref 36.0–46.0)
Hemoglobin: 13.1 g/dL (ref 12.0–15.0)
Immature Granulocytes: 1 %
Lymphocytes Relative: 8 %
Lymphs Abs: 0.6 10*3/uL — ABNORMAL LOW (ref 0.7–4.0)
MCH: 28.3 pg (ref 26.0–34.0)
MCHC: 32.6 g/dL (ref 30.0–36.0)
MCV: 86.8 fL (ref 80.0–100.0)
Monocytes Absolute: 0.4 10*3/uL (ref 0.1–1.0)
Monocytes Relative: 6 %
Neutro Abs: 6.6 10*3/uL (ref 1.7–7.7)
Neutrophils Relative %: 85 %
Platelets: 242 10*3/uL (ref 150–400)
RBC: 4.63 MIL/uL (ref 3.87–5.11)
RDW: 14.3 % (ref 11.5–15.5)
WBC: 7.7 10*3/uL (ref 4.0–10.5)
nRBC: 0 % (ref 0.0–0.2)

## 2021-05-27 LAB — GLUCOSE, CAPILLARY
Glucose-Capillary: 130 mg/dL — ABNORMAL HIGH (ref 70–99)
Glucose-Capillary: 101 mg/dL — ABNORMAL HIGH (ref 70–99)
Glucose-Capillary: 113 mg/dL — ABNORMAL HIGH (ref 70–99)

## 2021-05-27 LAB — COMPREHENSIVE METABOLIC PANEL WITH GFR
ALT: 23 U/L (ref 0–44)
AST: 22 U/L (ref 15–41)
Albumin: 3.4 g/dL — ABNORMAL LOW (ref 3.5–5.0)
Alkaline Phosphatase: 49 U/L (ref 38–126)
Anion gap: 13 (ref 5–15)
BUN: 20 mg/dL (ref 8–23)
CO2: 29 mmol/L (ref 22–32)
Calcium: 8.9 mg/dL (ref 8.9–10.3)
Chloride: 95 mmol/L — ABNORMAL LOW (ref 98–111)
Creatinine, Ser: 0.5 mg/dL (ref 0.44–1.00)
GFR, Estimated: >60 mL/min
Glucose, Bld: 121 mg/dL — ABNORMAL HIGH (ref 70–99)
Potassium: 3.4 mmol/L — ABNORMAL LOW (ref 3.5–5.1)
Sodium: 137 mmol/L (ref 135–145)
Total Bilirubin: 0.7 mg/dL (ref 0.3–1.2)
Total Protein: 5.7 g/dL — ABNORMAL LOW (ref 6.5–8.1)

## 2021-05-27 LAB — FERRITIN: Ferritin: 123 ng/mL (ref 11–307)

## 2021-05-27 LAB — D-DIMER, QUANTITATIVE: D-Dimer, Quant: 0.3 ug/mL-FEU (ref 0.00–0.50)

## 2021-05-27 LAB — C-REACTIVE PROTEIN: CRP: 0.6 mg/dL (ref <1.0)

## 2021-05-27 LAB — BRAIN NATRIURETIC PEPTIDE: B Natriuretic Peptide: 125 pg/mL — ABNORMAL HIGH (ref 0.0–100.0)

## 2021-05-27 MED ORDER — METHYLPREDNISOLONE SODIUM SUCC 40 MG IJ SOLR
40.0000 mg | Freq: Every day | INTRAMUSCULAR | Status: DC
  Administered 2021-05-27 – 2021-05-28 (×2): 40 mg via INTRAVENOUS
  Filled 2021-05-27 (×2): qty 1

## 2021-05-27 MED ORDER — POTASSIUM CHLORIDE 10 MEQ/100ML IV SOLN
10.0000 meq | INTRAVENOUS | Status: AC
  Administered 2021-05-27 (×4): 10 meq via INTRAVENOUS
  Filled 2021-05-27 (×4): qty 100

## 2021-05-27 MED ORDER — IPRATROPIUM-ALBUTEROL 20-100 MCG/ACT IN AERS
1.0000 | INHALATION_SPRAY | Freq: Three times a day (TID) | RESPIRATORY_TRACT | Status: DC
  Administered 2021-05-28 – 2021-06-01 (×14): 1 via RESPIRATORY_TRACT

## 2021-05-27 MED ORDER — DIATRIZOATE MEGLUMINE & SODIUM 66-10 % PO SOLN
90.0000 mL | Freq: Once | ORAL | Status: AC
  Administered 2021-05-27: 90 mL via ORAL
  Filled 2021-05-27: qty 90

## 2021-05-27 NOTE — Progress Notes (Signed)
Subjective: CC: Refused NGT yesterday. Discussed importance of NGT and patient states understanding but does not want NGT. She reports no abdominal pain or nausea this am. Still feels distended. Had some nausea yesterday. Passing some flatus with last episode this am. Feels some pressure in her bottom this morning and thinks she may have a bm.  Objective: Vital signs in last 24 hours: Temp:  [98 F (36.7 C)-98.9 F (37.2 C)] 98.9 F (37.2 C) (07/27 0606) Pulse Rate:  [63-69] 63 (07/27 0606) Resp:  [12-18] 12 (07/27 0606) BP: (126-158)/(80-83) 153/83 (07/27 0606) SpO2:  [90 %-93 %] 91 % (07/27 0606) Last BM Date: 05/23/21  Intake/Output from previous day: 07/26 0701 - 07/27 0700 In: 300 [P.O.:200; IV Piggyback:100] Out: 1100 [Urine:1100] Intake/Output this shift: Total I/O In: -  Out: 200 [Urine:200]  PE: Gen:  Alert, NAD, pleasant Pulm: normal rate and effort  Abd: Soft, moderate distension, NT, hypoactive bowel sounds Psych: A&Ox3 Skin: no rashes noted, warm and dry  Lab Results:  Recent Labs    05/26/21 0413 05/27/21 0400  WBC 9.2 7.7  HGB 13.0 13.1  HCT 40.5 40.2  PLT 233 242   BMET Recent Labs    05/26/21 0413 05/27/21 0400  NA 137 137  K 4.4 3.4*  CL 101 95*  CO2 26 29  GLUCOSE 118* 121*  BUN 12 20  CREATININE 0.48 0.50  CALCIUM 8.7* 8.9   PT/INR No results for input(s): LABPROT, INR in the last 72 hours. CMP     Component Value Date/Time   NA 137 05/27/2021 0400   NA 142 07/12/2016 0000   K 3.4 (L) 05/27/2021 0400   CL 95 (L) 05/27/2021 0400   CO2 29 05/27/2021 0400   GLUCOSE 121 (H) 05/27/2021 0400   BUN 20 05/27/2021 0400   BUN 5 07/12/2016 0000   CREATININE 0.50 05/27/2021 0400   CALCIUM 8.9 05/27/2021 0400   PROT 5.7 (L) 05/27/2021 0400   ALBUMIN 3.4 (L) 05/27/2021 0400   AST 22 05/27/2021 0400   ALT 23 05/27/2021 0400   ALKPHOS 49 05/27/2021 0400   BILITOT 0.7 05/27/2021 0400   GFRNONAA >60 05/27/2021 0400   GFRAA >60  07/07/2016 0455   Lipase     Component Value Date/Time   LIPASE 24 06/19/2016 0739    Studies/Results: DG CHEST PORT 1 VIEW  Result Date: 05/26/2021 CLINICAL DATA:  Small bowel obstruction. EXAM: PORTABLE CHEST 1 VIEW COMPARISON:  05/22/2021 FINDINGS: Single view of the chest again demonstrates some hyperinflation of the lungs. Heart size is within normal limits and stable. Atherosclerotic calcifications at the aortic arch. Few densities in the right lower chest could represent overlying structures. No significant airspace disease or lung consolidation. Lucency underneath the right hemidiaphragm is most compatible with bowel gas. IMPRESSION: No acute chest findings. Electronically Signed   By: Markus Daft M.D.   On: 05/26/2021 10:16   DG Abd Portable 2V  Result Date: 05/26/2021 CLINICAL DATA:  Encounter for small bowel obstruction. EXAM: PORTABLE ABDOMEN - 2 VIEW COMPARISON:  05/25/2021 FINDINGS: Large amount of bowel gas throughout the abdomen and pelvis. Lucency underneath the right hemidiaphragm is most compatible with dilated loops of bowel gas. The degree of bowel gas distension is similar to the previous examination. No evidence for free air. Degenerative changes in the left hip joint. IMPRESSION: 1. Persistent dilated gas-filled loops of bowel throughout the abdomen. No significant change from 05/25/2021. Findings remain concerning for a bowel  obstruction. Electronically Signed   By: Markus Daft M.D.   On: 05/26/2021 10:19    Anti-infectives: Anti-infectives (From admission, onward)    Start     Dose/Rate Route Frequency Ordered Stop   05/23/21 1000  remdesivir 100 mg in sodium chloride 0.9 % 100 mL IVPB       See Hyperspace for full Linked Orders Report.   100 mg 200 mL/hr over 30 Minutes Intravenous Daily 05/22/21 0444 05/26/21 1100   05/22/21 1100  cefTRIAXone (ROCEPHIN) 2 g in sodium chloride 0.9 % 100 mL IVPB        2 g 200 mL/hr over 30 Minutes Intravenous Daily 05/22/21 0939      05/22/21 0500  remdesivir 100 mg in sodium chloride 0.9 % 100 mL IVPB       See Hyperspace for full Linked Orders Report.   100 mg 200 mL/hr over 30 Minutes Intravenous Every 30 min 05/22/21 0444 05/22/21 0633   05/22/21 0000  cefTRIAXone (ROCEPHIN) 1 g injection        1 g Intramuscular  Once 05/22/21 0335 05/22/21 2359        Assessment/Plan Psbo vs ileus - X-ray this a.m. pending. Given history of prior surgery (ex-lap, loa in 2017 by Dr. Ninfa Linden) this could be an adhesive pSBO versus ileus in the setting of other disease processes.   - Patient does not want NGT. She is passing some flatus and nausea has resolved. Will discuss with MD about doing SBO protocol orally vs trial of cld.  - Keep K > 4 and Mg >2 for bowel function - Mobilize for bowel function - Hopefully will improve with conservative management. If does not improve with conservative management, may need exploration.    FEN - NPO, NGT, IVF VTE - SCDs, Lovenox  ID - Per TRH. None indicated from general surgery standpoint   - Per TRH -  COVID-19 COPD excerebration HTN Tobacco use LLL nodule    LOS: 5 days    Jillyn Ledger , [facility] 05/27/2021, 8:59 AM Please see Amion for pager number during day hours 7:00am-4:30pm

## 2021-05-27 NOTE — Progress Notes (Signed)
  Echocardiogram 2D Echocardiogram has been performed.  Brenda Gibson 05/27/2021, 3:59 PM

## 2021-05-27 NOTE — Plan of Care (Signed)
  Problem: Health Behavior/Discharge Planning: Goal: Ability to manage health-related needs will improve Outcome: Progressing   Problem: Clinical Measurements: Goal: Will remain free from infection Outcome: Progressing Goal: Diagnostic test results will improve Outcome: Progressing   

## 2021-05-27 NOTE — Progress Notes (Signed)
PROGRESS NOTE    Brenda Gibson  [id number] DOB: 06/13/1941 DOA: 05/22/2021 PCP: Lance Sell, NP    Brief Narrative:  80 year old female with history of hypertension, schizoaffective disorder, COPD and ongoing smoker presented to the emergency room with shortness of breath.  Chest x-ray was normal.  Patient initially noted to be hypoxic with 80% on room air.  COVID-19 PCR positive.  Patient admitted with COPD exacerbation due to COVID-19 virus.  During his hospitalization, she developed small bowel obstruction due to ileus.   Assessment & Plan:   Principal Problem:   Acute hypoxemic respiratory failure due to COVID-19 [facility]) Active Problems:   Tobacco abuse   Essential hypertension, benign   Hypokalemia   COPD exacerbation (HCC)   COVID-19 virus infection   Hyperglycemia   Partial small bowel obstruction (HCC)  Acute hypoxic restaurant failure secondary to acute COPD exacerbation due to COVID-19 infection: Treated with aggressive IV steroids and denies any steroids, will taper off steroids. Aggressive bronchodilator therapy, depending exercises, mucolytic's.  Scheduled nebulizers. Received Rocephin for 5 days, discontinue. Supportive care.  Wean oxygen to keep saturation more than 90%. Patient treated with IV steroids and IV remdesivir, completed therapy. Smoking cessation advised, nicotine patch provided.  Partial small bowel obstruction: Not present in the admission.  Suspect ileus.  KUB with persistent ileus.  Patient passing flatus.  Declines to have NG tube decompression. Continue NPO.  Maintenance IV fluids.  Surgery following.  Planning for a small bowel follow-through today.  Hypertension: Blood pressures fairly stable today.  Hypokalemia and hypomagnesemia: Replaced aggressively.  Further replace today.  DVT prophylaxis: enoxaparin (LOVENOX) injection 30 mg Start: 05/22/21 1000   Code Status: Full code Family Communication: None.  Will talk to  family. Disposition Plan: Status is: Inpatient  Remains inpatient appropriate because:Inpatient level of care appropriate due to severity of illness  Dispo: The patient is from: Home              Anticipated d/c is to: SNF              Patient currently is not medically stable to d/c.   Difficult to place patient No         Consultants:  General surgery  Procedures:  None  Antimicrobials:  Rocephin, 7/22-7/27.   Subjective: Patient seen and examined.  Denies any chest pain or shortness of breath.  Mild abdominal distention present.  She thinks she is passing flatus.  No bowel movement yet.  Denies any obvious nausea or vomiting.  Wants to eat and feels hungry.  Surgery planning for small bowel follow-through.  Objective: Vitals:   05/26/21 1224 05/26/21 1938 05/27/21 0606 05/27/21 1158  BP: 126/80 (!) 158/80 (!) 153/83 (!) 157/81  Pulse: 66 69 63 62  Resp: '18 16 12 16  '$ Temp: 98 F (36.7 C) 98 F (36.7 C) 98.9 F (37.2 C) 98.5 F (36.9 C)  TempSrc: Oral Oral Oral Oral  SpO2: 92% 90% 91% 94%  Weight:      Height:        Intake/Output Summary (Last 24 hours) at 05/27/2021 1441 Last data filed at 05/27/2021 1212 Gross per 24 hour  Intake 390 ml  Output 950 ml  Net -560 ml   Filed Weights   05/22/21 0207 05/26/21 0801  Weight: 45.4 kg 43.7 kg    Examination:  General exam: Frail and debilitated.  Not in any distress. Respiratory system: Bilateral poor air entry.  No added sounds. SpO2: 94 %  O2 Flow Rate (L/min): 4 L/min  Cardiovascular system: S1 & S2 heard, RRR. No JVD, murmurs, rubs, gallops or clicks. No pedal edema. Gastrointestinal system: Nontender.  Mildly distended.  Bowel sounds present. Central nervous system: Alert and oriented. No focal neurological deficits. Extremities: Symmetric 5 x 5 power. Skin: No rashes, lesions or ulcers Psychiatry: Judgement and insight appear normal. Mood & affect appropriate.     Data Reviewed: I have personally  reviewed following labs and imaging studies  CBC: Recent Labs  Lab 05/23/21 0319 05/24/21 0724 05/25/21 0336 05/26/21 0413 05/27/21 0400  WBC 6.6 13.6* 11.9* 9.2 7.7  NEUTROABS 5.6 11.9* 10.5* 7.9* 6.6  HGB 12.0 12.2 13.0 13.0 13.1  HCT 37.9 38.3 40.2 40.5 40.2  MCV 88.8 88.0 87.0 87.3 86.8  PLT 173 224 238 233 [id number]   Basic Metabolic Panel: Recent Labs  Lab 05/22/21 1050 05/23/21 0319 05/24/21 0427 05/25/21 0336 05/26/21 0413 05/27/21 0400  NA 139 135 134* 130* 137 137  K 4.2 4.3 4.3 3.2* 4.4 3.4*  CL 102 107 105 96* 101 95*  CO2 '23 22 24 26 26 29  '$ GLUCOSE 261* 215* 136* 96 118* 121*  BUN 17 24* '19 11 12 20  '$ CREATININE 0.65 0.65 0.41* 0.44 0.48 0.50  CALCIUM 8.9 8.8* 8.5* 8.4* 8.7* 8.9  MG 2.6* 2.3  --  2.0 1.9  --   PHOS 2.7 3.2  --   --   --   --    GFR: Estimated Creatinine Clearance: 40 mL/min (by C-G formula based on SCr of 0.5 mg/dL). Liver Function Tests: Recent Labs  Lab 05/23/21 0319 05/24/21 0427 05/25/21 0336 05/26/21 0413 05/27/21 0400  AST '20 17 21 28 22  '$ ALT '15 12 16 20 23  '$ ALKPHOS 56 52 54 46 49  BILITOT 0.3 0.4 0.5 0.6 0.7  PROT 6.1* 5.6* 6.0* 5.4* 5.7*  ALBUMIN 3.3* 3.1* 3.5 3.2* 3.4*   No results for input(s): LIPASE, AMYLASE in the last 168 hours. No results for input(s): AMMONIA in the last 168 hours. Coagulation Profile: No results for input(s): INR, PROTIME in the last 168 hours. Cardiac Enzymes: No results for input(s): CKTOTAL, CKMB, CKMBINDEX, TROPONINI in the last 168 hours. BNP (last 3 results) No results for input(s): PROBNP in the last 8760 hours. HbA1C: No results for input(s): HGBA1C in the last 72 hours. CBG: Recent Labs  Lab 05/26/21 1149 05/26/21 1739 05/26/21 2339 05/27/21 0604 05/27/21 1157  GLUCAP 136* 140* 143* 130* 101*   Lipid Profile: No results for input(s): CHOL, HDL, LDLCALC, TRIG, CHOLHDL, LDLDIRECT in the last 72 hours. Thyroid Function Tests: No results for input(s): TSH, T4TOTAL, FREET4,  T3FREE, THYROIDAB in the last 72 hours. Anemia Panel: Recent Labs    05/26/21 0413 05/27/21 0400  FERRITIN 141 123   Sepsis Labs: Recent Labs  Lab 05/22/21 0522  PROCALCITON <0.10    Recent Results (from the past 240 hour(s))  Resp Panel by RT-PCR (Flu A&B, Covid) Nasopharyngeal Swab     Status: Abnormal   Collection Time: 05/22/21  2:09 AM   Specimen: Nasopharyngeal Swab; Nasopharyngeal(NP) swabs in vial transport medium  Result Value Ref Range Status   SARS Coronavirus 2 by RT PCR POSITIVE (A) NEGATIVE Final    Comment: RESULT CALLED TO, READ BACK BY AND VERIFIED WITH: Leonides Sake, RN @ 0430 ON 05/22/21 C VARNER (NOTE) SARS-CoV-2 target nucleic acids are DETECTED.  The SARS-CoV-2 RNA is generally detectable in upper respiratory specimens during the acute phase of infection.  Positive results are indicative of the presence of the identified virus, but do not rule out bacterial infection or co-infection with other pathogens not detected by the test. Clinical correlation with patient history and other diagnostic information is necessary to determine patient infection status. The expected result is Negative.  Fact Sheet for Patients: EntrepreneurPulse.com.au  Fact Sheet for Healthcare Providers: IncredibleEmployment.be  This test is not yet approved or cleared by the [place] FDA and  has been authorized for detection and/or diagnosis of SARS-CoV-2 by FDA under an Emergency Use Authorization (EUA).  This EUA will remain in effect (meaning this test can  be used) for the duration of  the COVID-19 declaration under Section 564(b)(1) of the Act, 21 U.S.C. section 360bbb-3(b)(1), unless the authorization is terminated or revoked sooner.     Influenza A by PCR NEGATIVE NEGATIVE Final   Influenza B by PCR NEGATIVE NEGATIVE Final    Comment: (NOTE) The Xpert Xpress SARS-CoV-2/FLU/RSV plus assay is intended as an aid in the diagnosis of  influenza from Nasopharyngeal swab specimens and should not be used as a sole basis for treatment. Nasal washings and aspirates are unacceptable for Xpert Xpress SARS-CoV-2/FLU/RSV testing.  Fact Sheet for Patients: EntrepreneurPulse.com.au  Fact Sheet for Healthcare Providers: IncredibleEmployment.be  This test is not yet approved or cleared by the [place] FDA and has been authorized for detection and/or diagnosis of SARS-CoV-2 by FDA under an Emergency Use Authorization (EUA). This EUA will remain in effect (meaning this test can be used) for the duration of the COVID-19 declaration under Section 564(b)(1) of the Act, 21 U.S.C. section 360bbb-3(b)(1), unless the authorization is terminated or revoked.  Performed at [facility], [place] [address], [place], [place] [address]   Urine Culture     Status: None   Collection Time: 05/23/21  3:41 PM   Specimen: Urine, Catheterized  Result Value Ref Range Status   Specimen Description   Final    URINE, CATHETERIZED Performed at [facility] [address], [place], [place] [address]    Special Requests   Final    NONE Performed at [facility], [place] [address] [place], [place] [address]    Culture   Final    NO GROWTH Performed at [facility], [place] [address] [place],  [address]    Report Status 05/24/2021 FINAL  Final         Radiology Studies: DG CHEST PORT 1 VIEW  Result Date: 05/26/2021 CLINICAL DATA:  Small bowel obstruction. EXAM: PORTABLE CHEST 1 VIEW COMPARISON:  05/22/2021 FINDINGS: Single view of the chest again demonstrates some hyperinflation of the lungs. Heart size is within normal limits and stable. Atherosclerotic calcifications at the aortic arch. Few densities in the right lower chest could represent overlying structures. No significant airspace disease or lung consolidation. Lucency underneath the  right hemidiaphragm is most compatible with bowel gas. IMPRESSION: No acute chest findings. Electronically Signed   By: Markus Daft M.D.   On: 05/26/2021 10:16   DG Abd Portable 1V  Result Date: 05/27/2021 CLINICAL DATA:  Abdominal distension EXAM: PORTABLE ABDOMEN - 1 VIEW COMPARISON:  05/26/2021, 05/24/2021 FINDINGS: Persistently distended loops of large and small bowel throughout the abdomen, similar in appearance to the previous studies. No gross free intraperitoneal air on portable supine view. IMPRESSION: Persistently distended large and small bowel throughout the abdomen, similar in appearance to previous studies. Electronically Signed   By: Davina Poke D.O.  On: 05/27/2021 12:26   DG Abd Portable 2V  Result Date: 05/26/2021 CLINICAL DATA:  Encounter for small bowel obstruction. EXAM: PORTABLE ABDOMEN - 2 VIEW COMPARISON:  05/25/2021 FINDINGS: Large amount of bowel gas throughout the abdomen and pelvis. Lucency underneath the right hemidiaphragm is most compatible with dilated loops of bowel gas. The degree of bowel gas distension is similar to the previous examination. No evidence for free air. Degenerative changes in the left hip joint. IMPRESSION: 1. Persistent dilated gas-filled loops of bowel throughout the abdomen. No significant change from 05/25/2021. Findings remain concerning for a bowel obstruction. Electronically Signed   By: Markus Daft M.D.   On: 05/26/2021 10:19        Scheduled Meds:  (feeding supplement) PROSource Plus  30 mL Oral BID BM   vitamin C  500 mg Oral Daily   enoxaparin (LOVENOX) injection  30 mg Subcutaneous Q24H   fluticasone  2 spray Each Nare Daily   guaiFENesin  1,200 mg Oral BID   insulin aspart  0-9 Units Subcutaneous Q6H   Ipratropium-Albuterol  1 puff Inhalation Q6H   loratadine  10 mg Oral Daily   mouth rinse  15 mL Mouth Rinse BID   methylPREDNISolone (SOLU-MEDROL) injection  40 mg Intravenous Daily   metoprolol tartrate  2.5 mg Intravenous  Q8H   mometasone-formoterol  2 puff Inhalation BID   nicotine  14 mg Transdermal Daily   pantoprazole (PROTONIX) IV  40 mg Intravenous Daily   polyethylene glycol  17 g Oral BID   senna-docusate  1 tablet Oral BID   simethicone  160 mg Oral QID   sorbitol  30 mL Oral Once   zinc sulfate  220 mg Oral Daily   Continuous Infusions:     LOS: 5 days    Time spent: 30 minutes    Barb Merino, MD [facility] Pager [phone]

## 2021-05-28 ENCOUNTER — Inpatient Hospital Stay (HOSPITAL_COMMUNITY): Payer: Medicare Other

## 2021-05-28 DIAGNOSIS — U071 COVID-19: Secondary | ICD-10-CM | POA: Diagnosis not present

## 2021-05-28 DIAGNOSIS — J9601 Acute respiratory failure with hypoxia: Secondary | ICD-10-CM | POA: Diagnosis not present

## 2021-05-28 LAB — GLUCOSE, CAPILLARY
Glucose-Capillary: 102 mg/dL — ABNORMAL HIGH (ref 70–99)
Glucose-Capillary: 114 mg/dL — ABNORMAL HIGH (ref 70–99)
Glucose-Capillary: 123 mg/dL — ABNORMAL HIGH (ref 70–99)
Glucose-Capillary: 132 mg/dL — ABNORMAL HIGH (ref 70–99)

## 2021-05-28 NOTE — Plan of Care (Signed)

## 2021-05-28 NOTE — Progress Notes (Signed)
Subjective: CC: Contrast in colon this am. 3 liquid bm's in the last 24 hours. Passing flatus. Still some nausea. No abdominal pain.   Objective: Vital signs in last 24 hours: Temp:  [98.1 F (36.7 C)-98.5 F (36.9 C)] 98.5 F (36.9 C) (07/28 0604) Pulse Rate:  [61-70] 70 (07/28 0604) Resp:  [16] 16 (07/28 0604) BP: (133-157)/(75-81) 133/80 (07/28 0604) SpO2:  [93 %-94 %] 93 % (07/28 0604) Last BM Date: 05/23/21  Intake/Output from previous day: 07/27 0701 - 07/28 0700 In: 770 [P.O.:270; IV Piggyback:500] Out: 750 [Urine:750] Intake/Output this shift: No intake/output data recorded.  PE: Gen:  Alert, NAD, pleasant Pulm: normal rate and effort  Abd: Soft, moderate distension, NT, slightly hypoactive bowel sounds Psych: A&Ox3 Skin: no rashes noted, warm and dry    Lab Results:  Recent Labs    05/26/21 0413 05/27/21 0400  WBC 9.2 7.7  HGB 13.0 13.1  HCT 40.5 40.2  PLT 233 242   BMET Recent Labs    05/26/21 0413 05/27/21 0400  NA 137 137  K 4.4 3.4*  CL 101 95*  CO2 26 29  GLUCOSE 118* 121*  BUN 12 20  CREATININE 0.48 0.50  CALCIUM 8.7* 8.9   PT/INR No results for input(s): LABPROT, INR in the last 72 hours. CMP     Component Value Date/Time   NA 137 05/27/2021 0400   NA 142 07/12/2016 0000   K 3.4 (L) 05/27/2021 0400   CL 95 (L) 05/27/2021 0400   CO2 29 05/27/2021 0400   GLUCOSE 121 (H) 05/27/2021 0400   BUN 20 05/27/2021 0400   BUN 5 07/12/2016 0000   CREATININE 0.50 05/27/2021 0400   CALCIUM 8.9 05/27/2021 0400   PROT 5.7 (L) 05/27/2021 0400   ALBUMIN 3.4 (L) 05/27/2021 0400   AST 22 05/27/2021 0400   ALT 23 05/27/2021 0400   ALKPHOS 49 05/27/2021 0400   BILITOT 0.7 05/27/2021 0400   GFRNONAA >60 05/27/2021 0400   GFRAA >60 07/07/2016 0455   Lipase     Component Value Date/Time   LIPASE 24 06/19/2016 0739    Studies/Results: DG CHEST PORT 1 VIEW  Result Date: 05/26/2021 CLINICAL DATA:  Small bowel obstruction. EXAM:  PORTABLE CHEST 1 VIEW COMPARISON:  05/22/2021 FINDINGS: Single view of the chest again demonstrates some hyperinflation of the lungs. Heart size is within normal limits and stable. Atherosclerotic calcifications at the aortic arch. Few densities in the right lower chest could represent overlying structures. No significant airspace disease or lung consolidation. Lucency underneath the right hemidiaphragm is most compatible with bowel gas. IMPRESSION: No acute chest findings. Electronically Signed   By: Markus Daft M.D.   On: 05/26/2021 10:16   DG Abd Portable 1V-Small Bowel Obstruction Protocol-initial, 8 hr delay  Result Date: 05/27/2021 CLINICAL DATA:  80 year old female with 8 hour delayed film. EXAM: PORTABLE ABDOMEN - 1 VIEW COMPARISON:  Abdominal radiograph dated 05/27/2021. FINDINGS: Contrast noted in the pelvis, likely in the distal small bowel. There is also contrast in the proximal colon. Mild diffuse air distended small bowel loops. Osteopenia with degenerative changes of the spine. No acute osseous pathology. IMPRESSION: Oral contrast traverses into the colon. Electronically Signed   By: Anner Crete M.D.   On: 05/27/2021 20:46   DG Abd Portable 1V  Result Date: 05/27/2021 CLINICAL DATA:  Abdominal distension EXAM: PORTABLE ABDOMEN - 1 VIEW COMPARISON:  05/26/2021, 05/24/2021 FINDINGS: Persistently distended loops of large and small bowel throughout  the abdomen, similar in appearance to the previous studies. No gross free intraperitoneal air on portable supine view. IMPRESSION: Persistently distended large and small bowel throughout the abdomen, similar in appearance to previous studies. Electronically Signed   By: Davina Poke D.O.   On: 05/27/2021 12:26   DG Abd Portable 2V  Result Date: 05/26/2021 CLINICAL DATA:  Encounter for small bowel obstruction. EXAM: PORTABLE ABDOMEN - 2 VIEW COMPARISON:  05/25/2021 FINDINGS: Large amount of bowel gas throughout the abdomen and pelvis. Lucency  underneath the right hemidiaphragm is most compatible with dilated loops of bowel gas. The degree of bowel gas distension is similar to the previous examination. No evidence for free air. Degenerative changes in the left hip joint. IMPRESSION: 1. Persistent dilated gas-filled loops of bowel throughout the abdomen. No significant change from 05/25/2021. Findings remain concerning for a bowel obstruction. Electronically Signed   By: Markus Daft M.D.   On: 05/26/2021 10:19   ECHOCARDIOGRAM COMPLETE  Result Date: 05/27/2021    ECHOCARDIOGRAM REPORT   Patient Name:   Brenda Gibson Date of Exam: 05/27/2021 Medical Rec #:  [id number]       Height:       65.0 in Accession #:    [id number]      Weight:       96.3 lb Date of Birth:  22-Feb-1941        BSA:          1.450 m Patient Age:    80 years        BP:           153/83 mmHg Patient Gender: F               HR:           57 bpm. Exam Location:  Inpatient Procedure: 2D Echo, Cardiac Doppler and Color Doppler Indications:    Dyspnea R06.00  History:        Patient has no prior history of Echocardiogram examinations.                 Risk Factors:Hypertension. COVID-19 Positive.  Sonographer:    Jonelle Sidle Dance Referring Phys: [place]  1. Left ventricular ejection fraction, by estimation, is 60 to 65%. The left ventricle has normal function. The left ventricle has no regional wall motion abnormalities. There is moderate asymmetric left ventricular hypertrophy of the basal-septal segment. Left ventricular diastolic parameters are consistent with Grade I diastolic dysfunction (impaired relaxation).  2. Right ventricular systolic function is normal. The right ventricular size is normal. Tricuspid regurgitation signal is inadequate for assessing PA pressure.  3. The mitral valve is normal in structure. Mild mitral valve regurgitation.  4. The aortic valve was not well visualized. Aortic valve regurgitation is not visualized. No aortic stenosis is  present.  5. The inferior vena cava is normal in size with greater than 50% respiratory variability, suggesting right atrial pressure of 3 mmHg. FINDINGS  Left Ventricle: Left ventricular ejection fraction, by estimation, is 60 to 65%. The left ventricle has normal function. The left ventricle has no regional wall motion abnormalities. The left ventricular internal cavity size was small. There is moderate  asymmetric left ventricular hypertrophy of the basal-septal segment. Left ventricular diastolic parameters are consistent with Grade I diastolic dysfunction (impaired relaxation). Right Ventricle: The right ventricular size is normal. No increase in right ventricular wall thickness. Right ventricular systolic function is normal. Tricuspid regurgitation signal is inadequate for assessing PA  pressure. Left Atrium: Left atrial size was normal in size. Right Atrium: Right atrial size was normal in size. Pericardium: There is no evidence of pericardial effusion. Mitral Valve: The mitral valve is normal in structure. Mild mitral valve regurgitation. Tricuspid Valve: The tricuspid valve is normal in structure. Tricuspid valve regurgitation is trivial. Aortic Valve: The aortic valve was not well visualized. Aortic valve regurgitation is not visualized. No aortic stenosis is present. Pulmonic Valve: The pulmonic valve was not well visualized. Pulmonic valve regurgitation is trivial. Aorta: The aortic root and ascending aorta are structurally normal, with no evidence of dilitation. Venous: The inferior vena cava is normal in size with greater than 50% respiratory variability, suggesting right atrial pressure of 3 mmHg. IAS/Shunts: The interatrial septum was not well visualized.  LEFT VENTRICLE PLAX 2D LVIDd:         3.50 cm  Diastology LVIDs:         2.30 cm  LV e' medial:    4.13 cm/s LV PW:         1.10 cm  LV E/e' medial:  13.5 LV IVS:        0.90 cm  LV e' lateral:   4.35 cm/s LVOT diam:     1.80 cm  LV E/e' lateral:  12.8 LV SV:         62 LV SV Index:   43 LVOT Area:     2.54 cm  RIGHT VENTRICLE             IVC RV Basal diam:  2.20 cm     IVC diam: 1.60 cm RV S prime:     12.70 cm/s TAPSE (M-mode): 1.9 cm LEFT ATRIUM             Index       RIGHT ATRIUM          Index LA diam:        3.50 cm 2.41 cm/m  RA Area:     6.77 cm LA Vol (A2C):   45.1 ml 31.10 ml/m RA Volume:   10.70 ml 7.38 ml/m LA Vol (A4C):   18.5 ml 12.76 ml/m LA Biplane Vol: 31.6 ml 21.79 ml/m  AORTIC VALVE LVOT Vmax:   118.00 cm/s LVOT Vmean:  73.500 cm/s LVOT VTI:    0.245 m  AORTA Ao Root diam: 3.70 cm Ao Asc diam:  3.50 cm MITRAL VALVE MV Area (PHT): 3.27 cm    SHUNTS MV Decel Time: 232 msec    Systemic VTI:  0.24 m MV E velocity: 55.70 cm/s  Systemic Diam: 1.80 cm MV A velocity: 66.10 cm/s MV E/A ratio:  0.84 Oswaldo Milian MD Electronically signed by Oswaldo Milian MD Signature Date/Time: 05/27/2021/4:06:26 PM    Final     Anti-infectives: Anti-infectives (From admission, onward)    Start     Dose/Rate Route Frequency Ordered Stop   05/23/21 1000  remdesivir 100 mg in sodium chloride 0.9 % 100 mL IVPB       See Hyperspace for full Linked Orders Report.   100 mg 200 mL/hr over 30 Minutes Intravenous Daily 05/22/21 0444 05/26/21 1100   05/22/21 1100  cefTRIAXone (ROCEPHIN) 2 g in sodium chloride 0.9 % 100 mL IVPB  Status:  Discontinued        2 g 200 mL/hr over 30 Minutes Intravenous Daily 05/22/21 0939 05/27/21 1441   05/22/21 0500  remdesivir 100 mg in sodium chloride 0.9 % 100 mL IVPB  See Hyperspace for full Linked Orders Report.   100 mg 200 mL/hr over 30 Minutes Intravenous Every 30 min 05/22/21 0444 05/22/21 0633   05/22/21 0000  cefTRIAXone (ROCEPHIN) 1 g injection        1 g Intramuscular  Once 05/22/21 0335 05/22/21 2359        Assessment/Plan Psbo vs ileus - SBO protocol orally performed 7/27. Contrast in colon on xray this am. Had ROBF yesterday (passing flatus w/ 3 liquid bm's). Try CLD.  - Keep K  > 4 and Mg >2 for bowel function - Mobilize for bowel function - Hopefully will improve with conservative management. If does not improve with conservative management, may need exploration.    FEN - CLD VTE - SCDs, Lovenox  ID - Per TRH. None indicated from general surgery standpoint   - Per TRH -  COVID-19 COPD excerebration HTN Tobacco use LLL nodule   LOS: 6 days    Jillyn Ledger , [facility] 05/28/2021, 7:50 AM Please see Amion for pager number during day hours 7:00am-4:30pm

## 2021-05-28 NOTE — Progress Notes (Signed)
I spoke with pt by phone due to COVID Protocols. The pt sounds upbeat and cheerful.  The chaplain sang amazing grace to pt and prayed with her. The pt was grateful for the call.

## 2021-05-28 NOTE — Progress Notes (Signed)
Pt bowels are moving.  Pt with 2 medium semi formed to liquid bowel movements as of the time of this note.  Pt given the amount of medications over a number of days to make her bowels moved has increased the frequency and urgency to get up to [facility].  Pt scores a mocerate to high falls risk d/t age, previous fall in past 6 mo, pt equipment, meds given and urgency.  Pt demonstrates safe transfer from bed to [facility] with out assistance

## 2021-05-28 NOTE — Progress Notes (Signed)
PROGRESS NOTE    Brenda Gibson  [id number] DOB: 10-25-1941 DOA: 05/22/2021 PCP: Lance Sell, NP    Brief Narrative:  80 year old female with history of hypertension, schizoaffective disorder, COPD and ongoing smoker presented to the emergency room with shortness of breath.  Chest x-ray was normal.  Patient initially noted to be hypoxic with 80% on room air.  COVID-19 PCR positive.  Patient admitted with COPD exacerbation due to COVID-19 virus.  During his hospitalization, she developed small bowel obstruction due to ileus.   Assessment & Plan:   Principal Problem:   Acute hypoxemic respiratory failure due to COVID-19 [facility]) Active Problems:   Tobacco abuse   Essential hypertension, benign   Hypokalemia   COPD exacerbation (HCC)   COVID-19 virus infection   Hyperglycemia   Partial small bowel obstruction (HCC)  Acute hypoxic restaurant failure secondary to acute COPD exacerbation due to COVID-19 infection: Treated with aggressive IV steroids, bronchodilator therapy, lung exercises, mucolytic's.  Scheduled nebulizers. Received Rocephin for 5 days, discontinue. Supportive care.  Wean oxygen to keep saturation more than 90%. Patient treated with IV steroids and IV remdesivir, completed therapy. Smoking cessation advised, nicotine patch provided. Received 7 days of steroids already, will discontinue.  Partial small bowel obstruction: Not present in the admission.  Suspect ileus.  KUB with persistent ileus. Seen by surgery.  She had small bowel follow-through with barium and had multiple loose stools overnight.  Denies any nausea vomiting.  Challenging with clears today.  Will advance gradually.   Hypertension: Blood pressures fairly stable today.  Hypokalemia and hypomagnesemia: Replaced aggressively.  Recheck levels tomorrow morning.  Mobilize.  Wean off oxygen.  Home once she has normal bowel functions.  DVT prophylaxis: enoxaparin (LOVENOX) injection 30 mg Start:  05/22/21 1000   Code Status: Full code Family Communication: Called daughter-in-law, unable to receive phone. Disposition Plan: Status is: Inpatient  Remains inpatient appropriate because:Inpatient level of care appropriate due to severity of illness  Dispo: The patient is from: Home              Anticipated d/c is to: Home with home health.              Patient currently is not medically stable to d/c.   Difficult to place patient No         Consultants:  General surgery  Procedures:  None  Antimicrobials:  Rocephin, 7/22-7/27.   Subjective: Patient seen and examined.  No overnight events.  She had multiple episodes of loose stool overnight after her barium studies.  Feels hungry.  Denies any nausea vomiting.  Objective: Vitals:   05/27/21 1158 05/27/21 2148 05/28/21 0604 05/28/21 1149  BP: (!) 157/81 140/75 133/80 138/85  Pulse: 62 61 70 65  Resp: '16 16 16 18  '$ Temp: 98.5 F (36.9 C) 98.1 F (36.7 C) 98.5 F (36.9 C) 98.1 F (36.7 C)  TempSrc: Oral Oral Oral Oral  SpO2: 94% 94% 93% 92%  Weight:      Height:        Intake/Output Summary (Last 24 hours) at 05/28/2021 1326 Last data filed at 05/27/2021 2100 Gross per 24 hour  Intake 680 ml  Output 200 ml  Net 480 ml   Filed Weights   05/22/21 0207 05/26/21 0801  Weight: 45.4 kg 43.7 kg    Examination:  General: Frail debilitated but looks comfortable. Cardiovascular: S1-S2 normal.  Regular rate rhythm. Respiratory: Bilateral clear.  On 4 L oxygen saturating 94%. Gastrointestinal: Soft.  Nontender.  Mildly distended but bowel sounds present. Ext: No swelling, no cyanosis or edema.      Data Reviewed: I have personally reviewed following labs and imaging studies  CBC: Recent Labs  Lab 05/23/21 0319 05/24/21 0724 05/25/21 0336 05/26/21 0413 05/27/21 0400  WBC 6.6 13.6* 11.9* 9.2 7.7  NEUTROABS 5.6 11.9* 10.5* 7.9* 6.6  HGB 12.0 12.2 13.0 13.0 13.1  HCT 37.9 38.3 40.2 40.5 40.2  MCV 88.8  88.0 87.0 87.3 86.8  PLT 173 224 238 233 [id number]   Basic Metabolic Panel: Recent Labs  Lab 05/22/21 1050 05/23/21 0319 05/24/21 0427 05/25/21 0336 05/26/21 0413 05/27/21 0400  NA 139 135 134* 130* 137 137  K 4.2 4.3 4.3 3.2* 4.4 3.4*  CL 102 107 105 96* 101 95*  CO2 '23 22 24 26 26 29  '$ GLUCOSE 261* 215* 136* 96 118* 121*  BUN 17 24* '19 11 12 20  '$ CREATININE 0.65 0.65 0.41* 0.44 0.48 0.50  CALCIUM 8.9 8.8* 8.5* 8.4* 8.7* 8.9  MG 2.6* 2.3  --  2.0 1.9  --   PHOS 2.7 3.2  --   --   --   --    GFR: Estimated Creatinine Clearance: 40 mL/min (by C-G formula based on SCr of 0.5 mg/dL). Liver Function Tests: Recent Labs  Lab 05/23/21 0319 05/24/21 0427 05/25/21 0336 05/26/21 0413 05/27/21 0400  AST '20 17 21 28 22  '$ ALT '15 12 16 20 23  '$ ALKPHOS 56 52 54 46 49  BILITOT 0.3 0.4 0.5 0.6 0.7  PROT 6.1* 5.6* 6.0* 5.4* 5.7*  ALBUMIN 3.3* 3.1* 3.5 3.2* 3.4*   No results for input(s): LIPASE, AMYLASE in the last 168 hours. No results for input(s): AMMONIA in the last 168 hours. Coagulation Profile: No results for input(s): INR, PROTIME in the last 168 hours. Cardiac Enzymes: No results for input(s): CKTOTAL, CKMB, CKMBINDEX, TROPONINI in the last 168 hours. BNP (last 3 results) No results for input(s): PROBNP in the last 8760 hours. HbA1C: No results for input(s): HGBA1C in the last 72 hours. CBG: Recent Labs  Lab 05/27/21 1157 05/27/21 1801 05/28/21 0002 05/28/21 0601 05/28/21 1146  GLUCAP 101* 113* 123* 102* 114*   Lipid Profile: No results for input(s): CHOL, HDL, LDLCALC, TRIG, CHOLHDL, LDLDIRECT in the last 72 hours. Thyroid Function Tests: No results for input(s): TSH, T4TOTAL, FREET4, T3FREE, THYROIDAB in the last 72 hours. Anemia Panel: Recent Labs    05/26/21 0413 05/27/21 0400  FERRITIN 141 123   Sepsis Labs: Recent Labs  Lab 05/22/21 0522  PROCALCITON <0.10    Recent Results (from the past 240 hour(s))  Resp Panel by RT-PCR (Flu A&B, Covid)  Nasopharyngeal Swab     Status: Abnormal   Collection Time: 05/22/21  2:09 AM   Specimen: Nasopharyngeal Swab; Nasopharyngeal(NP) swabs in vial transport medium  Result Value Ref Range Status   SARS Coronavirus 2 by RT PCR POSITIVE (A) NEGATIVE Final    Comment: RESULT CALLED TO, READ BACK BY AND VERIFIED WITH: Leonides Sake, RN @ 0430 ON 05/22/21 C VARNER (NOTE) SARS-CoV-2 target nucleic acids are DETECTED.  The SARS-CoV-2 RNA is generally detectable in upper respiratory specimens during the acute phase of infection. Positive results are indicative of the presence of the identified virus, but do not rule out bacterial infection or co-infection with other pathogens not detected by the test. Clinical correlation with patient history and other diagnostic information is necessary to determine patient infection status. The expected result is Negative.  Fact Sheet for Patients: EntrepreneurPulse.com.au  Fact Sheet for Healthcare Providers: IncredibleEmployment.be  This test is not yet approved or cleared by the [place] FDA and  has been authorized for detection and/or diagnosis of SARS-CoV-2 by FDA under an Emergency Use Authorization (EUA).  This EUA will remain in effect (meaning this test can  be used) for the duration of  the COVID-19 declaration under Section 564(b)(1) of the Act, 21 U.S.C. section 360bbb-3(b)(1), unless the authorization is terminated or revoked sooner.     Influenza A by PCR NEGATIVE NEGATIVE Final   Influenza B by PCR NEGATIVE NEGATIVE Final    Comment: (NOTE) The Xpert Xpress SARS-CoV-2/FLU/RSV plus assay is intended as an aid in the diagnosis of influenza from Nasopharyngeal swab specimens and should not be used as a sole basis for treatment. Nasal washings and aspirates are unacceptable for Xpert Xpress SARS-CoV-2/FLU/RSV testing.  Fact Sheet for Patients: EntrepreneurPulse.com.au  Fact Sheet for  Healthcare Providers: IncredibleEmployment.be  This test is not yet approved or cleared by the [place] FDA and has been authorized for detection and/or diagnosis of SARS-CoV-2 by FDA under an Emergency Use Authorization (EUA). This EUA will remain in effect (meaning this test can be used) for the duration of the COVID-19 declaration under Section 564(b)(1) of the Act, 21 U.S.C. section 360bbb-3(b)(1), unless the authorization is terminated or revoked.  Performed at [facility], [place] [address] [place], [place] [address]   Urine Culture     Status: None   Collection Time: 05/23/21  3:41 PM   Specimen: Urine, Catheterized  Result Value Ref Range Status   Specimen Description   Final    URINE, CATHETERIZED Performed at [facility] [address] [place], [place] [address]    Special Requests   Final    NONE Performed at [facility], [place] [address], [place], [place] [address]    Culture   Final    NO GROWTH Performed at [facility], [place] [address] [place], [place] [address]    Report Status 05/24/2021 FINAL  Final         Radiology Studies: DG Abd Portable 1V-Small Bowel Obstruction Protocol-initial, 8 hr delay  Result Date: 05/27/2021 CLINICAL DATA:  80 year old female with 8 hour delayed film. EXAM: PORTABLE ABDOMEN - 1 VIEW COMPARISON:  Abdominal radiograph dated 05/27/2021. FINDINGS: Contrast noted in the pelvis, likely in the distal small bowel. There is also contrast in the proximal colon. Mild diffuse air distended small bowel loops. Osteopenia with degenerative changes of the spine. No acute osseous pathology. IMPRESSION: Oral contrast traverses into the colon. Electronically Signed   By: Anner Crete M.D.   On: 05/27/2021 20:46   DG Abd Portable 1V  Result Date: 05/27/2021 CLINICAL DATA:  Abdominal distension EXAM: PORTABLE ABDOMEN - 1 VIEW COMPARISON:  05/26/2021,  05/24/2021 FINDINGS: Persistently distended loops of large and small bowel throughout the abdomen, similar in appearance to the previous studies. No gross free intraperitoneal air on portable supine view. IMPRESSION: Persistently distended large and small bowel throughout the abdomen, similar in appearance to previous studies. Electronically Signed   By: Davina Poke D.O.   On: 05/27/2021 12:26   ECHOCARDIOGRAM COMPLETE  Result Date: 05/27/2021    ECHOCARDIOGRAM REPORT   Patient Name:   REESHEMAH TETTERTON Date of Exam: 05/27/2021 Medical Rec #:  [id number]       Height:       65.0 in Accession #:  [id number]      Weight:       96.3 lb Date of Birth:  06/25/1941        BSA:          1.450 m Patient Age:    57 years        BP:           153/83 mmHg Patient Gender: F               HR:           57 bpm. Exam Location:  Inpatient Procedure: 2D Echo, Cardiac Doppler and Color Doppler Indications:    Dyspnea R06.00  History:        Patient has no prior history of Echocardiogram examinations.                 Risk Factors:Hypertension. COVID-19 Positive.  Sonographer:    Jonelle Sidle Dance Referring Phys: [place]  1. Left ventricular ejection fraction, by estimation, is 60 to 65%. The left ventricle has normal function. The left ventricle has no regional wall motion abnormalities. There is moderate asymmetric left ventricular hypertrophy of the basal-septal segment. Left ventricular diastolic parameters are consistent with Grade I diastolic dysfunction (impaired relaxation).  2. Right ventricular systolic function is normal. The right ventricular size is normal. Tricuspid regurgitation signal is inadequate for assessing PA pressure.  3. The mitral valve is normal in structure. Mild mitral valve regurgitation.  4. The aortic valve was not well visualized. Aortic valve regurgitation is not visualized. No aortic stenosis is present.  5. The inferior vena cava is normal in size with greater than 50%  respiratory variability, suggesting right atrial pressure of 3 mmHg. FINDINGS  Left Ventricle: Left ventricular ejection fraction, by estimation, is 60 to 65%. The left ventricle has normal function. The left ventricle has no regional wall motion abnormalities. The left ventricular internal cavity size was small. There is moderate  asymmetric left ventricular hypertrophy of the basal-septal segment. Left ventricular diastolic parameters are consistent with Grade I diastolic dysfunction (impaired relaxation). Right Ventricle: The right ventricular size is normal. No increase in right ventricular wall thickness. Right ventricular systolic function is normal. Tricuspid regurgitation signal is inadequate for assessing PA pressure. Left Atrium: Left atrial size was normal in size. Right Atrium: Right atrial size was normal in size. Pericardium: There is no evidence of pericardial effusion. Mitral Valve: The mitral valve is normal in structure. Mild mitral valve regurgitation. Tricuspid Valve: The tricuspid valve is normal in structure. Tricuspid valve regurgitation is trivial. Aortic Valve: The aortic valve was not well visualized. Aortic valve regurgitation is not visualized. No aortic stenosis is present. Pulmonic Valve: The pulmonic valve was not well visualized. Pulmonic valve regurgitation is trivial. Aorta: The aortic root and ascending aorta are structurally normal, with no evidence of dilitation. Venous: The inferior vena cava is normal in size with greater than 50% respiratory variability, suggesting right atrial pressure of 3 mmHg. IAS/Shunts: The interatrial septum was not well visualized.  LEFT VENTRICLE PLAX 2D LVIDd:         3.50 cm  Diastology LVIDs:         2.30 cm  LV e' medial:    4.13 cm/s LV PW:         1.10 cm  LV E/e' medial:  13.5 LV IVS:        0.90 cm  LV e' lateral:   4.35 cm/s LVOT diam:  1.80 cm  LV E/e' lateral: 12.8 LV SV:         62 LV SV Index:   43 LVOT Area:     2.54 cm  RIGHT  VENTRICLE             IVC RV Basal diam:  2.20 cm     IVC diam: 1.60 cm RV S prime:     12.70 cm/s TAPSE (M-mode): 1.9 cm LEFT ATRIUM             Index       RIGHT ATRIUM          Index LA diam:        3.50 cm 2.41 cm/m  RA Area:     6.77 cm LA Vol (A2C):   45.1 ml 31.10 ml/m RA Volume:   10.70 ml 7.38 ml/m LA Vol (A4C):   18.5 ml 12.76 ml/m LA Biplane Vol: 31.6 ml 21.79 ml/m  AORTIC VALVE LVOT Vmax:   118.00 cm/s LVOT Vmean:  73.500 cm/s LVOT VTI:    0.245 m  AORTA Ao Root diam: 3.70 cm Ao Asc diam:  3.50 cm MITRAL VALVE MV Area (PHT): 3.27 cm    SHUNTS MV Decel Time: 232 msec    Systemic VTI:  0.24 m MV E velocity: 55.70 cm/s  Systemic Diam: 1.80 cm MV A velocity: 66.10 cm/s MV E/A ratio:  0.84 Oswaldo Milian MD Electronically signed by Oswaldo Milian MD Signature Date/Time: 05/27/2021/4:06:26 PM    Final         Scheduled Meds:  (feeding supplement) PROSource Plus  30 mL Oral BID BM   vitamin C  500 mg Oral Daily   enoxaparin (LOVENOX) injection  30 mg Subcutaneous Q24H   fluticasone  2 spray Each Nare Daily   guaiFENesin  1,200 mg Oral BID   insulin aspart  0-9 Units Subcutaneous Q6H   Ipratropium-Albuterol  1 puff Inhalation TID   loratadine  10 mg Oral Daily   mouth rinse  15 mL Mouth Rinse BID   metoprolol tartrate  2.5 mg Intravenous Q8H   mometasone-formoterol  2 puff Inhalation BID   nicotine  14 mg Transdermal Daily   pantoprazole (PROTONIX) IV  40 mg Intravenous Daily   polyethylene glycol  17 g Oral BID   senna-docusate  1 tablet Oral BID   simethicone  160 mg Oral QID   sorbitol  30 mL Oral Once   zinc sulfate  220 mg Oral Daily   Continuous Infusions:     LOS: 6 days    Time spent: 30 minutes    Barb Merino, MD [facility] Pager [phone]

## 2021-05-28 NOTE — Progress Notes (Signed)
Occupational Therapy Treatment Patient Details Name: Brenda Gibson MRN: [id number] DOB: 30-Jan-1941 Today's Date: 05/28/2021    History of present illness Patient is a 80 year old female who presented to the ED with shortness of breath. patient was found to have acute hypoxic respiratory failure, COVID 19, and COPD exacerbation. Pt also with Psbo vs ileus and pending NG tube.  PMH:HTN, schizoaffective disorder, and COPD.   OT comments  Patient is making progress towards goals. patient was able to complete functional activity challenges in standing with SUP no AD and with no LOB noted.patient was able to maintain O2 saturation on this date with activity. Patient plans to d/c home with son support at time of d/c. D/c plans remain appropriate.   Follow Up Recommendations  No OT follow up    Equipment Recommendations  None recommended by OT    Recommendations for Other Services      Precautions / Restrictions Precautions Precautions: Fall Precaution Comments: monitor O2       Mobility Bed Mobility Overal bed mobility: Modified Independent                  Transfers Overall transfer level: Needs assistance   Transfers: Sit to/from Stand Sit to Stand: Supervision         General transfer comment: with education on O2 cord management    Balance Overall balance assessment: Needs assistance Sitting-balance support: Feet supported Sitting balance-Leahy Scale: Good     Standing balance support: No upper extremity supported Standing balance-Leahy Scale: Fair Standing balance comment: no UE support standig at edge of bed                           ADL either performed or assessed with clinical judgement   ADL Overall ADL's : Needs assistance/impaired                                       General ADL Comments: patient was noted to have dizziness with standing. patients blood pressure seated was 133/79 mmhg standign at edge of bed bloo  dpressure was 124/79 mmhg. patient tolerated functional activity challanges for a total of ten minutes with O2 saturation maintaining 91% with supplemental O2.     Vision       Perception     Praxis      Cognition Arousal/Alertness: Awake/alert Behavior During Therapy: WFL for tasks assessed/performed Overall Cognitive Status: Within Functional Limits for tasks assessed                                 General Comments: unable to carry over education during session        Exercises     Shoulder Instructions       General Comments      Pertinent Vitals/ Pain       Pain Assessment: No/denies pain  Home Living                                          Prior Functioning/Environment              Frequency  Min 1X/week        Progress Toward Goals  OT  Goals(current goals can now be found in the care plan section)  Progress towards OT goals: Progressing toward goals  Acute Rehab OT Goals Patient Stated Goal: to go home OT Goal Formulation: With patient Time For Goal Achievement: 05/29/21 Potential to Achieve Goals: Good  Plan Discharge plan remains appropriate    Co-evaluation                 AM-PAC OT "6 Clicks" Daily Activity     Outcome Measure   Help from another person eating meals?: None Help from another person taking care of personal grooming?: None Help from another person toileting, which includes using toliet, bedpan, or urinal?: None Help from another person bathing (including washing, rinsing, drying)?: A Little Help from another person to put on and taking off regular upper body clothing?: None Help from another person to put on and taking off regular lower body clothing?: None 6 Click Score: 23    End of Session Equipment Utilized During Treatment: Gait belt  OT Visit Diagnosis: History of falling (Z91.81)   Activity Tolerance Patient tolerated treatment well   Patient Left in bed;with call  bell/phone within reach   Nurse Communication Other (comment) (nurse cleared patient to participate)        Time: [id number] OT Time Calculation (min): 18 min  Charges: OT General Charges $OT Visit: 1 Visit OT Treatments $Self Care/Home Management : 8-22 mins  Jackelyn Poling OTR/L, [facility] Acute Rehabilitation Department Office# [phone] Pager# [place] 05/28/2021, 4:03 PM

## 2021-05-28 NOTE — Plan of Care (Signed)
  Problem: Health Behavior/Discharge Planning: Goal: Ability to manage health-related needs will improve Outcome: Progressing   Problem: Activity: Goal: Risk for activity intolerance will decrease Outcome: Progressing   Problem: Nutrition: Goal: Adequate nutrition will be maintained Outcome: Progressing   

## 2021-05-29 ENCOUNTER — Inpatient Hospital Stay (HOSPITAL_COMMUNITY): Payer: Medicare Other

## 2021-05-29 DIAGNOSIS — J9601 Acute respiratory failure with hypoxia: Secondary | ICD-10-CM | POA: Diagnosis not present

## 2021-05-29 DIAGNOSIS — J441 Chronic obstructive pulmonary disease with (acute) exacerbation: Secondary | ICD-10-CM | POA: Diagnosis not present

## 2021-05-29 DIAGNOSIS — U071 COVID-19: Secondary | ICD-10-CM | POA: Diagnosis not present

## 2021-05-29 LAB — PHOSPHORUS: Phosphorus: 3.3 mg/dL (ref 2.5–4.6)

## 2021-05-29 LAB — CBC WITH DIFFERENTIAL/PLATELET
Abs Immature Granulocytes: 0.07 10*3/uL (ref 0.00–0.07)
Basophils Absolute: 0 10*3/uL (ref 0.0–0.1)
Basophils Relative: 0 %
Eosinophils Absolute: 0 10*3/uL (ref 0.0–0.5)
Eosinophils Relative: 0 %
HCT: 38.5 % (ref 36.0–46.0)
Hemoglobin: 12.5 g/dL (ref 12.0–15.0)
Immature Granulocytes: 1 %
Lymphocytes Relative: 12 %
Lymphs Abs: 1.3 10*3/uL (ref 0.7–4.0)
MCH: 28.2 pg (ref 26.0–34.0)
MCHC: 32.5 g/dL (ref 30.0–36.0)
MCV: 86.7 fL (ref 80.0–100.0)
Monocytes Absolute: 1.2 10*3/uL — ABNORMAL HIGH (ref 0.1–1.0)
Monocytes Relative: 11 %
Neutro Abs: 8.1 10*3/uL — ABNORMAL HIGH (ref 1.7–7.7)
Neutrophils Relative %: 76 %
Platelets: 260 10*3/uL (ref 150–400)
RBC: 4.44 MIL/uL (ref 3.87–5.11)
RDW: 14.2 % (ref 11.5–15.5)
WBC: 10.6 10*3/uL — ABNORMAL HIGH (ref 4.0–10.5)
nRBC: 0 % (ref 0.0–0.2)

## 2021-05-29 LAB — BASIC METABOLIC PANEL
Anion gap: 7 (ref 5–15)
BUN: 16 mg/dL (ref 8–23)
CO2: 28 mmol/L (ref 22–32)
Calcium: 8.5 mg/dL — ABNORMAL LOW (ref 8.9–10.3)
Chloride: 99 mmol/L (ref 98–111)
Creatinine, Ser: 0.49 mg/dL (ref 0.44–1.00)
GFR, Estimated: >60 mL/min (ref >60)
Glucose, Bld: 84 mg/dL (ref 70–99)
Potassium: 3.7 mmol/L (ref 3.5–5.1)
Sodium: 134 mmol/L — ABNORMAL LOW (ref 135–145)

## 2021-05-29 LAB — GLUCOSE, CAPILLARY
Glucose-Capillary: 119 mg/dL — ABNORMAL HIGH (ref 70–99)
Glucose-Capillary: 138 mg/dL — ABNORMAL HIGH (ref 70–99)
Glucose-Capillary: 139 mg/dL — ABNORMAL HIGH (ref 70–99)
Glucose-Capillary: 93 mg/dL (ref 70–99)

## 2021-05-29 LAB — MAGNESIUM: Magnesium: 2.3 mg/dL (ref 1.7–2.4)

## 2021-05-29 MED ORDER — ADULT MULTIVITAMIN W/MINERALS CH
1.0000 | ORAL_TABLET | Freq: Every day | ORAL | Status: DC
  Administered 2021-05-29 – 2021-06-01 (×4): 1 via ORAL
  Filled 2021-05-29 (×4): qty 1

## 2021-05-29 MED ORDER — BOOST / RESOURCE BREEZE PO LIQD CUSTOM
1.0000 | Freq: Three times a day (TID) | ORAL | Status: DC
  Administered 2021-05-29: 1 via ORAL

## 2021-05-29 MED ORDER — PANTOPRAZOLE SODIUM 40 MG PO TBEC
40.0000 mg | DELAYED_RELEASE_TABLET | Freq: Every day | ORAL | Status: DC
  Administered 2021-05-30 – 2021-06-01 (×3): 40 mg via ORAL
  Filled 2021-05-29 (×3): qty 1

## 2021-05-29 NOTE — Progress Notes (Addendum)
Nutrition Follow-up  DOCUMENTATION CODES:   Underweight  INTERVENTION:   -If unable to have diet advanced beyond clears, will need to consider TPN. Pt continues to lose weight and not able to meet needs on clears.  -Boost Breeze po TID, each supplement provides 250 kcal and 9 grams of protein  -Multivitamin with minerals daily  -D/c Prosource  NUTRITION DIAGNOSIS:   Increased nutrient needs related to acute illness as evidenced by estimated needs.  Ongoing.  GOAL:   Patient will meet greater than or equal to 90% of their needs  Not meeting.  MONITOR:   PO intake, Supplement acceptance, Labs, Weight trends, I & O's, Diet advancement  ASSESSMENT:   80 year old female history of hypertension, schizoaffective disorder, COPD with ongoing tobacco use presented to the ED with shortness of breath with no improvement on home nebs with associated wheezing.  Chest x-ray done negative.  Patient noted to be hypoxic with sats in the upper 80s on room air on presentation.  COVID-19 PCR positive.  Patient admitted for acute COPD exacerbation  7/22: admitted, COVID-19+ 7/24: diet changed to clears the NPO 7/28: diet changed back to clears  Patient has been unable to have diet advanced past clears for 5 days now. Plan is now to advanced as tolerated. If unable to keep diet advanced in the next 24-48 hours ,need to consider TPN initiation.  Per surgery note 7/28, no evidence of obstruction, most like ileus.  Pt has only accepted 1 Prosource in the past 3 days. Will resume Boost Breeze and MVI (these were d/c when pt was made NPO).  Admission weight: 100 lbs. Current weight: 92 lbs. Weight is -8 lbs since admission.  Medications: Vitamin C, Miralax, Senokot, Zinc sulfate   Labs reviewed:  CBGs; 93-139 Low Na  Diet Order:   Diet Order             Diet clear liquid Room service appropriate? No; Fluid consistency: Thin  Diet effective now                   EDUCATION  NEEDS:   No education needs have been identified at this time  Skin:  Skin Assessment: Reviewed RN Assessment  Last BM:  7/23 -type 1  Height:   Ht Readings from Last 1 Encounters:  05/26/21 '5\' 5"'$  (1.651 m)    Weight:   Wt Readings from Last 1 Encounters:  05/29/21 42 kg    BMI:  Body mass index is 15.41 kg/m.  Estimated Nutritional Needs:   Kcal:  1450-1650  Protein:  70-85g  Fluid:  1.6L/day  Clayton Bibles, MS, RD, LDN Inpatient Clinical Dietitian Contact information available via Amion

## 2021-05-29 NOTE — Care Management Important Message (Signed)
Important Message  Patient Details IM Letter given to the Patient. Name: NIMRIT PANTEL MRN: [id number] Date of Birth: 1941/08/22   Medicare Important Message Given:  Yes     Kerin Salen 05/29/2021, 11:05 AM

## 2021-05-29 NOTE — Progress Notes (Addendum)
The patient is receiving Protonix by the intravenous route.  Based on criteria approved by the Pharmacy and [place], the medication is being converted to the equivalent oral dose form.  These criteria include: -No active GI bleeding -Able to tolerate diet of full liquids (or better) or tube feeding -Able to tolerate other medications by the oral or enteral route  If you have any questions about this conversion, please contact the Pharmacy Department (phone [phone]).  Thank you.   Minda Ditto PharmD [facility] [phone] 05/29/2021, 2:17 PM

## 2021-05-29 NOTE — Progress Notes (Signed)
Physical Therapy Treatment Patient Details Name: Brenda Gibson MRN: [id number] DOB: 01-24-1941 Today's Date: 05/29/2021    History of Present Illness Patient is a 80 year old female who presented to the ED with shortness of breath. patient was found to have acute hypoxic respiratory failure, COVID 19, and COPD exacerbation. Pt also with Psbo vs ileus and pending NG tube.  PMH:HTN, schizoaffective disorder, and COPD.    PT Comments    Patient  is resting in bed , son present.Spo2 on 4 L [place] 94%. Patient ambulated x 60' then 30' with Drop to 89 %. Encouraged use of PLB  when SOB,and IS/flutter. Patient does not exhibit very much energy, quite sedate and does not want to sit up in recliner, even with encouragement.  Continue PT for mobility and exercises.  Left in bed on 4 L.  Follow Up Recommendations  SNF;Supervision for mobility/OOB     Equipment Recommendations  None recommended by PT    Recommendations for Other Services       Precautions / Restrictions Precautions Precaution Comments: monitor O2    Mobility  Bed Mobility Overal bed mobility: Modified Independent             General bed mobility comments: use of bed rails with increased time    Transfers   Equipment used: None Transfers: Sit to/from Stand Sit to Stand: Supervision;Min guard         General transfer comment: second stand was mildly  imbalanced  Ambulation/Gait Ambulation/Gait assistance: Min guard Gait Distance (Feet): 60 Feet (30') Assistive device: 1 person hand held assist;None   Gait velocity: decr   General Gait Details: On 4 L  rest 94%, > 89% on 4 L  for activity with  occassional  support on bed rail.   Stairs             Wheelchair Mobility    Modified Rankin (Stroke Patients Only)       Balance Overall balance assessment: Needs assistance Sitting-balance support: Feet supported Sitting balance-Leahy Scale: Good     Standing balance support: No upper  extremity supported Standing balance-Leahy Scale: Fair Standing balance comment: dynamic i requires min support of 1 hand                            Cognition Arousal/Alertness: Awake/alert Behavior During Therapy: Flat affect                                   General Comments: very flat, minimal conversation , son in room encouraging  patient      Exercises General Exercises - Lower Extremity Long Arc Quad: AROM;10 reps;Both Hip ABduction/ADduction: AROM;Both;10 reps;Seated Hip Flexion/Marching: AROM;10 reps;Both;Seated    General Comments        Pertinent Vitals/Pain      Home Living                      Prior Function            PT Goals (current goals can now be found in the care plan section) Progress towards PT goals: Progressing toward goals    Frequency    Min 2X/week      PT Plan Current plan remains appropriate    Co-evaluation              AM-PAC PT "6 Clicks"  Mobility   Outcome Measure  Help needed turning from your back to your side while in a flat bed without using bedrails?: None Help needed moving from lying on your back to sitting on the side of a flat bed without using bedrails?: None Help needed moving to and from a bed to a chair (including a wheelchair)?: A Little Help needed standing up from a chair using your arms (e.g., wheelchair or bedside chair)?: A Little Help needed to walk in hospital room?: A Little Help needed climbing 3-5 steps with a railing? : A Lot 6 Click Score: 19    End of Session Equipment Utilized During Treatment: Oxygen Activity Tolerance: Patient tolerated treatment well;Patient limited by fatigue (low energy) Patient left: in bed;with call bell/phone within reach;with family/visitor present Nurse Communication: Mobility status PT Visit Diagnosis: Other abnormalities of gait and mobility (R26.89);Muscle weakness (generalized) (M62.81)     Time: [id number] PT Time  Calculation (min) (ACUTE ONLY): 31 min  Charges:  $Gait Training: 8-22 mins $Therapeutic Exercise: 8-22 mins                     Tresa Endo PT Acute Rehabilitation Services Pager [phone] Office [phone]    Claretha Cooper 05/29/2021, 10:40 AM

## 2021-05-29 NOTE — Progress Notes (Signed)
[facility]  PROGRESS NOTE  Brenda Gibson [id number] DOB: 1941/09/13 DOA: 05/22/2021 PCP: Lance Sell, NP   Brief HPI:   80 year old female with a history of hypertension, schizoaffective disorder, COPD, ongoing tobacco abuse presented with shortness of breath.  She was found to be hypoxemic with O2 sats 80% on room air.  SARS-CoV-2 PCR was positive.  During hospitalization patient developed small bowel obstruction due to ileus.    Subjective   Patient seen and examined, still requiring 4 L/min of oxygen.   Assessment/Plan:     Acute hypoxemic respiratory failure -Secondary to COPD exacerbation/COVID-19 infection -Received Rocephin for 5 days -Continue supportive care, wean off oxygen to keep O2 sats more than 90% -Patient treated with IV steroids and 1 day severe, therapy completed -Still requiring 4 L/min of oxygen; repeat chest x-ray today   Partial SBO -Not present on admission; suspect ileus -Seen by general surgery -General surgery has signed off, will advance diet to soft diet  Hypertension -Blood pressure is stable  Hypomagnesemia -Replete  Hypokalemia -Replete   Scheduled medications:    vitamin C  500 mg Oral Daily   enoxaparin (LOVENOX) injection  30 mg Subcutaneous Q24H   feeding supplement  1 Container Oral TID BM   fluticasone  2 spray Each Nare Daily   guaiFENesin  1,200 mg Oral BID   insulin aspart  0-9 Units Subcutaneous Q6H   Ipratropium-Albuterol  1 puff Inhalation TID   loratadine  10 mg Oral Daily   mouth rinse  15 mL Mouth Rinse BID   metoprolol tartrate  2.5 mg Intravenous Q8H   mometasone-formoterol  2 puff Inhalation BID   multivitamin with minerals  1 tablet Oral Daily   nicotine  14 mg Transdermal Daily   [START ON 05/30/2021] pantoprazole  40 mg Oral Daily   polyethylene glycol  17 g Oral BID   senna-docusate  1 tablet Oral BID   simethicone  160 mg Oral QID   sorbitol  30 mL Oral Once   zinc sulfate  220 mg  Oral Daily         Data Reviewed:   CBG:  Recent Labs  Lab 05/28/21 1819 05/29/21 0025 05/29/21 0549 05/29/21 1156 05/29/21 1638  GLUCAP 132* 139* 93 138* 119*    SpO2: 95 % O2 Flow Rate (L/min): 4 L/min    Vitals:   05/28/21 1149 05/28/21 2155 05/29/21 0551 05/29/21 1201  BP: 138/85 (!) 146/72 137/73 120/77  Pulse: 65 68 (!) 58 65  Resp: '18 19 18 18  '$ Temp: 98.1 F (36.7 C) 98.1 F (36.7 C) (!) 97.3 F (36.3 C) 98.4 F (36.9 C)  TempSrc: Oral Oral Oral Oral  SpO2: 92% 94% 96% 95%  Weight:   42 kg   Height:         Intake/Output Summary (Last 24 hours) at 05/29/2021 1824 Last data filed at 05/29/2021 1600 Gross per 24 hour  Intake 600 ml  Output --  Net 600 ml    07/27 1901 - 07/29 0700 In: 300 [P.O.:300] Out: -   Filed Weights   05/22/21 0207 05/26/21 0801 05/29/21 0551  Weight: 45.4 kg 43.7 kg 42 kg    CBC:  Recent Labs  Lab 05/24/21 0724 05/25/21 0336 05/26/21 0413 05/27/21 0400 05/29/21 0409  WBC 13.6* 11.9* 9.2 7.7 10.6*  HGB 12.2 13.0 13.0 13.1 12.5  HCT 38.3 40.2 40.5 40.2 38.5  PLT 224 238 233 242 260  MCV 88.0 87.0 87.3 86.8  86.7  MCH 28.0 28.1 28.0 28.3 28.2  MCHC 31.9 32.3 32.1 32.6 32.5  RDW 14.6 14.5 14.3 14.3 14.2  LYMPHSABS 1.0 0.7 0.8 0.6* 1.3  MONOABS 0.7 0.5 0.5 0.4 1.2*  EOSABS 0.0 0.0 0.0 0.0 0.0  BASOSABS 0.0 0.0 0.0 0.0 0.0    Complete metabolic panel:  Recent Labs  Lab 05/23/21 0319 05/24/21 0427 05/25/21 0336 05/26/21 0413 05/27/21 0400 05/29/21 0409  NA 135 134* 130* 137 137 134*  K 4.3 4.3 3.2* 4.4 3.4* 3.7  CL 107 105 96* 101 95* 99  CO2 '22 24 26 26 29 28  '$ GLUCOSE 215* 136* 96 118* 121* 84  BUN 24* '19 11 12 20 16  '$ CREATININE 0.65 0.41* 0.44 0.48 0.50 0.49  CALCIUM 8.8* 8.5* 8.4* 8.7* 8.9 8.5*  AST '20 17 21 28 22  '$ --   ALT '15 12 16 20 23  '$ --   ALKPHOS 56 52 54 46 49  --   BILITOT 0.3 0.4 0.5 0.6 0.7  --   ALBUMIN 3.3* 3.1* 3.5 3.2* 3.4*  --   MG 2.3  --  2.0 1.9  --  2.3  CRP 1.2* <0.5 0.5  0.6 0.6  --   DDIMER 0.33 0.36 0.44 0.42 0.30  --   HGBA1C 6.3*  --   --   --   --   --   BNP  --   --   --  543.9* 125.0*  --     No results for input(s): LIPASE, AMYLASE in the last 168 hours.  Recent Labs  Lab 05/23/21 0319 05/24/21 0427 05/25/21 0336 05/26/21 0413 05/27/21 0400  CRP 1.2* <0.5 0.5 0.6 0.6  DDIMER 0.33 0.36 0.44 0.42 0.30  BNP  --   --   --  543.9* 125.0*    ------------------------------------------------------------------------------------------------------------------ No results for input(s): CHOL, HDL, LDLCALC, TRIG, CHOLHDL, LDLDIRECT in the last 72 hours.  Lab Results  Component Value Date   HGBA1C 6.3 (H) 05/23/2021   ------------------------------------------------------------------------------------------------------------------ No results for input(s): TSH, T4TOTAL, T3FREE, THYROIDAB in the last 72 hours.  Invalid input(s): FREET3 ------------------------------------------------------------------------------------------------------------------ Recent Labs    05/27/21 0400  FERRITIN 123    Coagulation profile No results for input(s): INR, PROTIME in the last 168 hours. Recent Labs    05/27/21 0400  DDIMER 0.30    Cardiac Enzymes No results for input(s): CKTOTAL, CKMB, CKMBINDEX, TROPONINI in the last 168 hours.  ------------------------------------------------------------------------------------------------------------------    Component Value Date/Time   BNP 125.0 (H) 05/27/2021 0400     Antibiotics: Anti-infectives (From admission, onward)    Start     Dose/Rate Route Frequency Ordered Stop   05/23/21 1000  remdesivir 100 mg in sodium chloride 0.9 % 100 mL IVPB       See Hyperspace for full Linked Orders Report.   100 mg 200 mL/hr over 30 Minutes Intravenous Daily 05/22/21 0444 05/26/21 1100   05/22/21 1100  cefTRIAXone (ROCEPHIN) 2 g in sodium chloride 0.9 % 100 mL IVPB  Status:  Discontinued        2 g 200 mL/hr over 30  Minutes Intravenous Daily 05/22/21 0939 05/27/21 1441   05/22/21 0500  remdesivir 100 mg in sodium chloride 0.9 % 100 mL IVPB       See Hyperspace for full Linked Orders Report.   100 mg 200 mL/hr over 30 Minutes Intravenous Every 30 min 05/22/21 0444 05/22/21 0633   05/22/21 0000  cefTRIAXone (ROCEPHIN) 1 g injection  1 g Intramuscular  Once 05/22/21 0335 05/22/21 2359        Radiology Reports  DG Abd Portable 1V-Small Bowel Obstruction Protocol-24 hr delay  Result Date: 05/28/2021 CLINICAL DATA:  Small bowel obstruction. EXAM: PORTABLE ABDOMEN - 1 VIEW COMPARISON:  May 27, 2021. FINDINGS: Mildly dilated small bowel loops are noted. Contrast is seen in the right and left colons. No radio-opaque calculi or other significant radiographic abnormality are seen. IMPRESSION: Residual contrast is seen in the right and left colons. Mildly dilated small bowel loops are noted. Electronically Signed   By: Marijo Conception M.D.   On: 05/28/2021 14:40   DG Abd Portable 1V-Small Bowel Obstruction Protocol-initial, 8 hr delay  Result Date: 05/27/2021 CLINICAL DATA:  80 year old female with 8 hour delayed film. EXAM: PORTABLE ABDOMEN - 1 VIEW COMPARISON:  Abdominal radiograph dated 05/27/2021. FINDINGS: Contrast noted in the pelvis, likely in the distal small bowel. There is also contrast in the proximal colon. Mild diffuse air distended small bowel loops. Osteopenia with degenerative changes of the spine. No acute osseous pathology. IMPRESSION: Oral contrast traverses into the colon. Electronically Signed   By: Anner Crete M.D.   On: 05/27/2021 20:46      DVT prophylaxis: Lovenox  Code Status: Full code  Family Communication: No family at bedside   Consultants:   Procedures:     Objective    Physical Examination:   General-appears in no acute distress Heart-S1-S2, regular, no murmur auscultated Lungs-bilateral rhonchi auscultated Abdomen-soft, nontender, no  organomegaly Extremities-no edema in the lower extremities Neuro-alert, oriented x3, no focal deficit noted  Status is: Inpatient  Dispo: The patient is from: Home              Anticipated d/c is to: Home              Anticipated d/c date is: 06/01/2021              Patient currently not stable for discharge  Barrier to discharge-acute hypoxemic respiratory failure  COVID-19 Labs  Recent Labs    05/27/21 0400  DDIMER 0.30  FERRITIN 123  CRP 0.6    Lab Results  Component Value Date   SARSCOV2NAA POSITIVE (A) 05/22/2021    Microbiology  Recent Results (from the past 240 hour(s))  Resp Panel by RT-PCR (Flu A&B, Covid) Nasopharyngeal Swab     Status: Abnormal   Collection Time: 05/22/21  2:09 AM   Specimen: Nasopharyngeal Swab; Nasopharyngeal(NP) swabs in vial transport medium  Result Value Ref Range Status   SARS Coronavirus 2 by RT PCR POSITIVE (A) NEGATIVE Final    Comment: RESULT CALLED TO, READ BACK BY AND VERIFIED WITH: Leonides Sake, RN @ 0430 ON 05/22/21 C VARNER (NOTE) SARS-CoV-2 target nucleic acids are DETECTED.  The SARS-CoV-2 RNA is generally detectable in upper respiratory specimens during the acute phase of infection. Positive results are indicative of the presence of the identified virus, but do not rule out bacterial infection or co-infection with other pathogens not detected by the test. Clinical correlation with patient history and other diagnostic information is necessary to determine patient infection status. The expected result is Negative.  Fact Sheet for Patients: EntrepreneurPulse.com.au  Fact Sheet for Healthcare Providers: IncredibleEmployment.be  This test is not yet approved or cleared by the [place] FDA and  has been authorized for detection and/or diagnosis of SARS-CoV-2 by FDA under an Emergency Use Authorization (EUA).  This EUA will remain in effect (meaning this test can  be used) for the duration  of  the COVID-19 declaration under Section 564(b)(1) of the Act, 21 U.S.C. section 360bbb-3(b)(1), unless the authorization is terminated or revoked sooner.     Influenza A by PCR NEGATIVE NEGATIVE Final   Influenza B by PCR NEGATIVE NEGATIVE Final    Comment: (NOTE) The Xpert Xpress SARS-CoV-2/FLU/RSV plus assay is intended as an aid in the diagnosis of influenza from Nasopharyngeal swab specimens and should not be used as a sole basis for treatment. Nasal washings and aspirates are unacceptable for Xpert Xpress SARS-CoV-2/FLU/RSV testing.  Fact Sheet for Patients: EntrepreneurPulse.com.au  Fact Sheet for Healthcare Providers: IncredibleEmployment.be  This test is not yet approved or cleared by the [place] FDA and has been authorized for detection and/or diagnosis of SARS-CoV-2 by FDA under an Emergency Use Authorization (EUA). This EUA will remain in effect (meaning this test can be used) for the duration of the COVID-19 declaration under Section 564(b)(1) of the Act, 21 U.S.C. section 360bbb-3(b)(1), unless the authorization is terminated or revoked.  Performed at [facility], [place] [address] [place], [place] [address]   Urine Culture     Status: None   Collection Time: 05/23/21  3:41 PM   Specimen: Urine, Catheterized  Result Value Ref Range Status   Specimen Description   Final    URINE, CATHETERIZED Performed at [place] [address] [place], [place] [address]    Special Requests   Final    NONE Performed at [facility], [place] [address], [place], [place] [address]    Culture   Final    NO GROWTH Performed at [facility], [place] [address] [place], [place] [address]    Report Status 05/24/2021 FINAL  Final             Oswald Hillock   [facility] If 7PM-7AM, please contact night-coverage at [email or web address], Office   [phone]   05/29/2021, 6:24 PM  LOS: 7 days

## 2021-05-30 DIAGNOSIS — J9601 Acute respiratory failure with hypoxia: Secondary | ICD-10-CM | POA: Diagnosis not present

## 2021-05-30 DIAGNOSIS — U071 COVID-19: Secondary | ICD-10-CM | POA: Diagnosis not present

## 2021-05-30 DIAGNOSIS — J441 Chronic obstructive pulmonary disease with (acute) exacerbation: Secondary | ICD-10-CM | POA: Diagnosis not present

## 2021-05-30 LAB — GLUCOSE, CAPILLARY
Glucose-Capillary: 111 mg/dL — ABNORMAL HIGH (ref 70–99)
Glucose-Capillary: 128 mg/dL — ABNORMAL HIGH (ref 70–99)
Glucose-Capillary: 149 mg/dL — ABNORMAL HIGH (ref 70–99)
Glucose-Capillary: 167 mg/dL — ABNORMAL HIGH (ref 70–99)
Glucose-Capillary: 96 mg/dL (ref 70–99)

## 2021-05-30 NOTE — Progress Notes (Signed)
[facility]  PROGRESS NOTE  Brenda Gibson [id number] DOB: 31-Aug-1941 DOA: 05/22/2021 PCP: Lance Sell, NP   Brief HPI:   80 year old female with a history of hypertension, schizoaffective disorder, COPD, ongoing tobacco abuse presented with shortness of breath.  She was found to be hypoxemic with O2 sats 80% on room air.  SARS-CoV-2 PCR was positive.  During hospitalization patient developed small bowel obstruction due to ileus.    Subjective   Patient seen and examined, denies shortness of breath.  Diuresed well with IV Lasix.  Oxygen has been weaned off to 2 L/min   Assessment/Plan:     Acute hypoxemic respiratory failure -Secondary to COPD exacerbation/COVID-19 infection -Received Rocephin for 5 days -Continue supportive care, wean off oxygen to keep O2 sats more than 90% -Patient treated with IV steroids and 1 day severe, therapy completed -She was requiring 4 L/min of oxygen, repeat chest x-ray shows no acute abnormality.   -O2 weaned off to 2 L/min; continue to wean off as tolerated   Partial SBO -Not present on admission; suspect ileus -Seen by general surgery -General surgery has signed off, advance to soft diet.  Hypertension -Blood pressure is stable  Hypomagnesemia -Replete  Hypokalemia -Replete   Scheduled medications:    vitamin C  500 mg Oral Daily   enoxaparin (LOVENOX) injection  30 mg Subcutaneous Q24H   feeding supplement  1 Container Oral TID BM   fluticasone  2 spray Each Nare Daily   guaiFENesin  1,200 mg Oral BID   insulin aspart  0-9 Units Subcutaneous Q6H   Ipratropium-Albuterol  1 puff Inhalation TID   loratadine  10 mg Oral Daily   mouth rinse  15 mL Mouth Rinse BID   metoprolol tartrate  2.5 mg Intravenous Q8H   mometasone-formoterol  2 puff Inhalation BID   multivitamin with minerals  1 tablet Oral Daily   nicotine  14 mg Transdermal Daily   pantoprazole  40 mg Oral Daily   polyethylene glycol  17 g Oral BID    senna-docusate  1 tablet Oral BID   simethicone  160 mg Oral QID   sorbitol  30 mL Oral Once   zinc sulfate  220 mg Oral Daily         Data Reviewed:   CBG:  Recent Labs  Lab 05/29/21 1638 05/30/21 0005 05/30/21 0548 05/30/21 1109 05/30/21 1623  GLUCAP 119* 149* 96 128* 167*    SpO2: 99 % O2 Flow Rate (L/min): 2 L/min FiO2 (%): 92 %    Vitals:   05/29/21 2001 05/30/21 0500 05/30/21 0515 05/30/21 1145  BP: 126/67  125/75 117/70  Pulse: 69  63 70  Resp: '17  18 18  '$ Temp: 99.1 F (37.3 C)  98.5 F (36.9 C) 98.7 F (37.1 C)  TempSrc: Oral  Oral Oral  SpO2: 95%  98% 99%  Weight:  41.6 kg    Height:         Intake/Output Summary (Last 24 hours) at 05/30/2021 1644 Last data filed at 05/30/2021 0915 Gross per 24 hour  Intake 500 ml  Output --  Net 500 ml    07/28 1901 - 07/30 0700 In: 900 [P.O.:900] Out: -   Filed Weights   05/26/21 0801 05/29/21 0551 05/30/21 0500  Weight: 43.7 kg 42 kg 41.6 kg    CBC:  Recent Labs  Lab 05/24/21 0724 05/25/21 0336 05/26/21 0413 05/27/21 0400 05/29/21 0409  WBC 13.6* 11.9* 9.2 7.7 10.6*  HGB 12.2  13.0 13.0 13.1 12.5  HCT 38.3 40.2 40.5 40.2 38.5  PLT 224 238 233 242 260  MCV 88.0 87.0 87.3 86.8 86.7  MCH 28.0 28.1 28.0 28.3 28.2  MCHC 31.9 32.3 32.1 32.6 32.5  RDW 14.6 14.5 14.3 14.3 14.2  LYMPHSABS 1.0 0.7 0.8 0.6* 1.3  MONOABS 0.7 0.5 0.5 0.4 1.2*  EOSABS 0.0 0.0 0.0 0.0 0.0  BASOSABS 0.0 0.0 0.0 0.0 0.0    Complete metabolic panel:  Recent Labs  Lab 05/24/21 0427 05/25/21 0336 05/26/21 0413 05/27/21 0400 05/29/21 0409  NA 134* 130* 137 137 134*  K 4.3 3.2* 4.4 3.4* 3.7  CL 105 96* 101 95* 99  CO2 '24 26 26 29 28  '$ GLUCOSE 136* 96 118* 121* 84  BUN '19 11 12 20 16  '$ CREATININE 0.41* 0.44 0.48 0.50 0.49  CALCIUM 8.5* 8.4* 8.7* 8.9 8.5*  AST '17 21 28 22  '$ --   ALT '12 16 20 23  '$ --   ALKPHOS 52 54 46 49  --   BILITOT 0.4 0.5 0.6 0.7  --   ALBUMIN 3.1* 3.5 3.2* 3.4*  --   MG  --  2.0 1.9  --  2.3   CRP <0.5 0.5 0.6 0.6  --   DDIMER 0.36 0.44 0.42 0.30  --   BNP  --   --  543.9* 125.0*  --     No results for input(s): LIPASE, AMYLASE in the last 168 hours.  Recent Labs  Lab 05/24/21 0427 05/25/21 0336 05/26/21 0413 05/27/21 0400  CRP <0.5 0.5 0.6 0.6  DDIMER 0.36 0.44 0.42 0.30  BNP  --   --  543.9* 125.0*    ------------------------------------------------------------------------------------------------------------------ No results for input(s): CHOL, HDL, LDLCALC, TRIG, CHOLHDL, LDLDIRECT in the last 72 hours.  Lab Results  Component Value Date   HGBA1C 6.3 (H) 05/23/2021   ------------------------------------------------------------------------------------------------------------------ No results for input(s): TSH, T4TOTAL, T3FREE, THYROIDAB in the last 72 hours.  Invalid input(s): FREET3 ------------------------------------------------------------------------------------------------------------------ No results for input(s): VITAMINB12, FOLATE, FERRITIN, TIBC, IRON, RETICCTPCT in the last 72 hours.   Coagulation profile No results for input(s): INR, PROTIME in the last 168 hours. No results for input(s): DDIMER in the last 72 hours.   Cardiac Enzymes No results for input(s): CKTOTAL, CKMB, CKMBINDEX, TROPONINI in the last 168 hours.  ------------------------------------------------------------------------------------------------------------------    Component Value Date/Time   BNP 125.0 (H) 05/27/2021 0400     Antibiotics: Anti-infectives (From admission, onward)    Start     Dose/Rate Route Frequency Ordered Stop   05/23/21 1000  remdesivir 100 mg in sodium chloride 0.9 % 100 mL IVPB       See Hyperspace for full Linked Orders Report.   100 mg 200 mL/hr over 30 Minutes Intravenous Daily 05/22/21 0444 05/26/21 1100   05/22/21 1100  cefTRIAXone (ROCEPHIN) 2 g in sodium chloride 0.9 % 100 mL IVPB  Status:  Discontinued        2 g 200 mL/hr over 30  Minutes Intravenous Daily 05/22/21 0939 05/27/21 1441   05/22/21 0500  remdesivir 100 mg in sodium chloride 0.9 % 100 mL IVPB       See Hyperspace for full Linked Orders Report.   100 mg 200 mL/hr over 30 Minutes Intravenous Every 30 min 05/22/21 0444 05/22/21 0633   05/22/21 0000  cefTRIAXone (ROCEPHIN) 1 g injection        1 g Intramuscular  Once 05/22/21 0335 05/22/21 2359  Radiology Reports  DG Chest Port 1V same Day  Result Date: 05/29/2021 CLINICAL DATA:  Acute hypoxemic respiratory failure. Productive cough. COVID positive. EXAM: PORTABLE CHEST 1 VIEW COMPARISON:  Chest x-ray 05/26/2021 FINDINGS: The heart size and mediastinal contours are unchanged. Aortic calcification. Elevated left hemidiaphragm. Associated left base atelectasis. No focal consolidation. No pulmonary edema. No pleural effusion. No pneumothorax. No acute osseous abnormality. IMPRESSION: No acute cardiopulmonary abnormality. Electronically Signed   By: Iven Finn M.D.   On: 05/29/2021 21:01      DVT prophylaxis: Lovenox  Code Status: Full code  Family Communication: No family at bedside   Consultants:   Procedures:     Objective    Physical Examination:   General-appears in no acute distress Heart-S1-S2, regular, no murmur auscultated Lungs-clear to auscultation bilaterally, no wheezing or crackles auscultated Abdomen-soft, nontender, no organomegaly Extremities-no edema in the lower extremities Neuro-alert, oriented x3, no focal deficit noted  Status is: Inpatient  Dispo: The patient is from: Home              Anticipated d/c is to: Home              Anticipated d/c date is: 06/01/2021              Patient currently not stable for discharge  Barrier to discharge-acute hypoxemic respiratory failure  COVID-19 Labs  No results for input(s): DDIMER, FERRITIN, LDH, CRP in the last 72 hours.   Lab Results  Component Value Date   SARSCOV2NAA POSITIVE (A) 05/22/2021     Microbiology  Recent Results (from the past 240 hour(s))  Resp Panel by RT-PCR (Flu A&B, Covid) Nasopharyngeal Swab     Status: Abnormal   Collection Time: 05/22/21  2:09 AM   Specimen: Nasopharyngeal Swab; Nasopharyngeal(NP) swabs in vial transport medium  Result Value Ref Range Status   SARS Coronavirus 2 by RT PCR POSITIVE (A) NEGATIVE Final    Comment: RESULT CALLED TO, READ BACK BY AND VERIFIED WITH: Leonides Sake, RN @ 0430 ON 05/22/21 C VARNER (NOTE) SARS-CoV-2 target nucleic acids are DETECTED.  The SARS-CoV-2 RNA is generally detectable in upper respiratory specimens during the acute phase of infection. Positive results are indicative of the presence of the identified virus, but do not rule out bacterial infection or co-infection with other pathogens not detected by the test. Clinical correlation with patient history and other diagnostic information is necessary to determine patient infection status. The expected result is Negative.  Fact Sheet for Patients: EntrepreneurPulse.com.au  Fact Sheet for Healthcare Providers: IncredibleEmployment.be  This test is not yet approved or cleared by the [place] FDA and  has been authorized for detection and/or diagnosis of SARS-CoV-2 by FDA under an Emergency Use Authorization (EUA).  This EUA will remain in effect (meaning this test can  be used) for the duration of  the COVID-19 declaration under Section 564(b)(1) of the Act, 21 U.S.C. section 360bbb-3(b)(1), unless the authorization is terminated or revoked sooner.     Influenza A by PCR NEGATIVE NEGATIVE Final   Influenza B by PCR NEGATIVE NEGATIVE Final    Comment: (NOTE) The Xpert Xpress SARS-CoV-2/FLU/RSV plus assay is intended as an aid in the diagnosis of influenza from Nasopharyngeal swab specimens and should not be used as a sole basis for treatment. Nasal washings and aspirates are unacceptable for Xpert Xpress  SARS-CoV-2/FLU/RSV testing.  Fact Sheet for Patients: EntrepreneurPulse.com.au  Fact Sheet for Healthcare Providers: IncredibleEmployment.be  This test is not yet approved or cleared  by the [place] and has been authorized for detection and/or diagnosis of SARS-CoV-2 by FDA under an Emergency Use Authorization (EUA). This EUA will remain in effect (meaning this test can be used) for the duration of the COVID-19 declaration under Section 564(b)(1) of the Act, 21 U.S.C. section 360bbb-3(b)(1), unless the authorization is terminated or revoked.  Performed at [facility], [place] [address] [place], [place] [address]   Urine Culture     Status: None   Collection Time: 05/23/21  3:41 PM   Specimen: Urine, Catheterized  Result Value Ref Range Status   Specimen Description   Final    URINE, CATHETERIZED Performed at [place] [address] [place], [place] [address]    Special Requests   Final    NONE Performed at [facility], [place] [address], [place], [place] [address]    Culture   Final    NO GROWTH Performed at [facility], [place] [address] [place], [place] [address]    Report Status 05/24/2021 FINAL  Final             Oswald Hillock   [facility] If 7PM-7AM, please contact night-coverage at [email or web address], Office  [phone]   05/30/2021, 4:44 PM  LOS: 8 days

## 2021-05-31 DIAGNOSIS — U071 COVID-19: Secondary | ICD-10-CM | POA: Diagnosis not present

## 2021-05-31 DIAGNOSIS — J9601 Acute respiratory failure with hypoxia: Secondary | ICD-10-CM | POA: Diagnosis not present

## 2021-05-31 LAB — BASIC METABOLIC PANEL
Anion gap: 9 (ref 5–15)
BUN: 9 mg/dL (ref 8–23)
CO2: 27 mmol/L (ref 22–32)
Calcium: 8.4 mg/dL — ABNORMAL LOW (ref 8.9–10.3)
Chloride: 100 mmol/L (ref 98–111)
Creatinine, Ser: 0.46 mg/dL (ref 0.44–1.00)
GFR, Estimated: >60 mL/min (ref >60)
Glucose, Bld: 98 mg/dL (ref 70–99)
Potassium: 3.8 mmol/L (ref 3.5–5.1)
Sodium: 136 mmol/L (ref 135–145)

## 2021-05-31 LAB — GLUCOSE, CAPILLARY
Glucose-Capillary: 103 mg/dL — ABNORMAL HIGH (ref 70–99)
Glucose-Capillary: 116 mg/dL — ABNORMAL HIGH (ref 70–99)
Glucose-Capillary: 142 mg/dL — ABNORMAL HIGH (ref 70–99)
Glucose-Capillary: 146 mg/dL — ABNORMAL HIGH (ref 70–99)
Glucose-Capillary: 160 mg/dL — ABNORMAL HIGH (ref 70–99)

## 2021-05-31 NOTE — Progress Notes (Signed)
PROGRESS NOTE    Brenda Gibson  [id number] DOB: [birth year] DOA: 05/22/2021 PCP: Brenda Sell, NP   No chief complaint on file.  Brief Narrative:  80 year old female with Brenda Gibson history of hypertension, schizoaffective disorder, COPD, ongoing tobacco abuse presented with shortness of breath.  She was found to be hypoxemic with O2 sats 80% on room air.  SARS-CoV-2 PCR was positive.  During hospitalization patient developed ileus vs partial SBO.  Assessment & Plan:   Principal Problem:   Acute hypoxemic respiratory failure due to COVID-19 [facility]) Active Problems:   Tobacco abuse   Essential hypertension, benign   Hypokalemia   COPD exacerbation (HCC)   COVID-19 virus infection   Hyperglycemia   Partial small bowel obstruction (HCC)  Acute hypoxemic respiratory failure  Covid 19 Virus Infection  COPD Exacerbation -Secondary to COPD exacerbation/COVID-19 infection -s/p ceftriaxone 7/22-27 -completed remdesivir  -steroids completed -continues to require supplemental oxygen, follow  -strict I/O, daily weights -inhalers  COVID-19 Labs  No results for input(s): DDIMER, FERRITIN, LDH, CRP in the last 72 hours.  Lab Results  Component Value Date   SARSCOV2NAA POSITIVE (Brenda Gibson) 05/22/2021   Ileus -pSBO vs ileus 2/2 infection -Seen by general surgery -General surgery has signed off, advance to soft diet.   Hypertension -Blood pressure is stable  -was on scheduled IV metop, will d/c (? Due to NPO with above?)   Hypomagnesemia -Replete   Hypokalemia -Replete  DVT prophylaxis: lovenox Code Status: full  Family Communication: none at bedside Disposition:   Status is: Inpatient  Remains inpatient appropriate because:Inpatient level of care appropriate due to severity of illness  Dispo: The patient is from: Home              Anticipated d/c is to: Home              Patient currently is not medically stable to d/c.   Difficult to place patient No        Consultants:  none  Procedures:  Echo IMPRESSIONS     1. Left ventricular ejection fraction, by estimation, is 60 to 65%. The  left ventricle has normal function. The left ventricle has no regional  wall motion abnormalities. There is moderate asymmetric left ventricular  hypertrophy of the basal-septal  segment. Left ventricular diastolic parameters are consistent with Grade I  diastolic dysfunction (impaired relaxation).   2. Right ventricular systolic function is normal. The right ventricular  size is normal. Tricuspid regurgitation signal is inadequate for assessing  PA pressure.   3. The mitral valve is normal in structure. Mild mitral valve  regurgitation.   4. The aortic valve was not well visualized. Aortic valve regurgitation  is not visualized. No aortic stenosis is present.   5. The inferior vena cava is normal in size with greater than 50%  respiratory variability, suggesting right atrial pressure of 3 mmHg.   Antimicrobials:  Anti-infectives (From admission, onward)    Start     Dose/Rate Route Frequency Ordered Stop   05/23/21 1000  remdesivir 100 mg in sodium chloride 0.9 % 100 mL IVPB       See Hyperspace for full Linked Orders Report.   100 mg 200 mL/hr over 30 Minutes Intravenous Daily 05/22/21 0444 05/26/21 1100   05/22/21 1100  cefTRIAXone (ROCEPHIN) 2 g in sodium chloride 0.9 % 100 mL IVPB  Status:  Discontinued        2 g 200 mL/hr over 30 Minutes Intravenous Daily 05/22/21 0939 05/27/21 1441  05/22/21 0500  remdesivir 100 mg in sodium chloride 0.9 % 100 mL IVPB       See Hyperspace for full Linked Orders Report.   100 mg 200 mL/hr over 30 Minutes Intravenous Every 30 min 05/22/21 0444 05/22/21 0633   05/22/21 0000  cefTRIAXone (ROCEPHIN) 1 g injection        1 g Intramuscular  Once 05/22/21 0335 05/22/21 2359          Subjective: No new complaints  Objective: Vitals:   05/30/21 2157 05/31/21 0500 05/31/21 0541 05/31/21 0800  BP: 126/69   122/73   Pulse: 65  68   Resp: 17  17   Temp: 98.2 F (36.8 C)  98 F (36.7 C)   TempSrc: Oral  Oral   SpO2: 95%  95% 94%  Weight:  41.2 kg    Height:       No intake or output data in the 24 hours ending 05/31/21 1720 Filed Weights   05/29/21 0551 05/30/21 0500 05/31/21 0500  Weight: 42 kg 41.6 kg 41.2 kg    Examination:  General exam: Appears calm and comfortable  Respiratory system: Clear to auscultation. Respiratory effort normal. Cardiovascular system: S1 & S2 heard, RRR.  Gastrointestinal system: Abdomen is nondistended, soft and nontender.  Central nervous system: Alert and oriented. No focal neurological deficits. Extremities:no LEE Skin: No rashes, lesions or ulcers Psychiatry: Judgement and insight appear normal. Mood & affect appropriate.     Data Reviewed: I have personally reviewed following labs and imaging studies  CBC: Recent Labs  Lab 05/25/21 0336 05/26/21 0413 05/27/21 0400 05/29/21 0409  WBC 11.9* 9.2 7.7 10.6*  NEUTROABS 10.5* 7.9* 6.6 8.1*  HGB 13.0 13.0 13.1 12.5  HCT 40.2 40.5 40.2 38.5  MCV 87.0 87.3 86.8 86.7  PLT 238 233 242 [id number]    Basic Metabolic Panel: Recent Labs  Lab 05/25/21 0336 05/26/21 0413 05/27/21 0400 05/29/21 0409 05/31/21 0438  NA 130* 137 137 134* 136  K 3.2* 4.4 3.4* 3.7 3.8  CL 96* 101 95* 99 100  CO2 '26 26 29 28 27  '$ GLUCOSE 96 118* 121* 84 98  BUN '11 12 20 16 9  '$ CREATININE 0.44 0.48 0.50 0.49 0.46  CALCIUM 8.4* 8.7* 8.9 8.5* 8.4*  MG 2.0 1.9  --  2.3  --   PHOS  --   --   --  3.3  --     GFR: Estimated Creatinine Clearance: 37.7 mL/min (by C-G formula based on SCr of 0.46 mg/dL).  Liver Function Tests: Recent Labs  Lab 05/25/21 0336 05/26/21 0413 05/27/21 0400  AST '21 28 22  '$ ALT '16 20 23  '$ ALKPHOS 54 46 49  BILITOT 0.5 0.6 0.7  PROT 6.0* 5.4* 5.7*  ALBUMIN 3.5 3.2* 3.4*    CBG: Recent Labs  Lab 05/30/21 2343 05/31/21 0547 05/31/21 0902 05/31/21 1234 05/31/21 1710  GLUCAP 111* 103*  116* 142* 146*     Recent Results (from the past 240 hour(s))  Resp Panel by RT-PCR (Flu Brenda Gibson&B, Covid) Nasopharyngeal Swab     Status: Abnormal   Collection Time: 05/22/21  2:09 AM   Specimen: Nasopharyngeal Swab; Nasopharyngeal(NP) swabs in vial transport medium  Result Value Ref Range Status   SARS Coronavirus 2 by RT PCR POSITIVE (Angela Platner) NEGATIVE Final    Comment: RESULT CALLED TO, READ BACK BY AND VERIFIED WITH: Leonides Sake, RN @ 0430 ON 05/22/21 C VARNER (NOTE) SARS-CoV-2 target nucleic acids are DETECTED.  The SARS-CoV-2 RNA  is generally detectable in upper respiratory specimens during the acute phase of infection. Positive results are indicative of the presence of the identified virus, but do not rule out bacterial infection or co-infection with other pathogens not detected by the test. Clinical correlation with patient history and other diagnostic information is necessary to determine patient infection status. The expected result is Negative.  Fact Sheet for Patients: EntrepreneurPulse.com.au  Fact Sheet for Healthcare Providers: IncredibleEmployment.be  This test is not yet approved or cleared by the [place] FDA and  has been authorized for detection and/or diagnosis of SARS-CoV-2 by FDA under an Emergency Use Authorization (EUA).  This EUA will remain in effect (meaning this test can  be used) for the duration of  the COVID-19 declaration under Section 564(b)(1) of the Act, 21 U.S.C. section 360bbb-3(b)(1), unless the authorization is terminated or revoked sooner.     Influenza Johari Pinney by PCR NEGATIVE NEGATIVE Final   Influenza B by PCR NEGATIVE NEGATIVE Final    Comment: (NOTE) The Xpert Xpress SARS-CoV-2/FLU/RSV plus assay is intended as an aid in the diagnosis of influenza from Nasopharyngeal swab specimens and should not be used as [place] sole basis for treatment. Nasal washings and aspirates are unacceptable for Xpert Xpress  SARS-CoV-2/FLU/RSV testing.  Fact Sheet for Patients: EntrepreneurPulse.com.au  Fact Sheet for Healthcare Providers: IncredibleEmployment.be  This test is not yet approved or cleared by the [place] FDA and has been authorized for detection and/or diagnosis of SARS-CoV-2 by FDA under an Emergency Use Authorization (EUA). This EUA will remain in effect (meaning this test can be used) for the duration of the COVID-19 declaration under Section 564(b)(1) of the Act, 21 U.S.C. section 360bbb-3(b)(1), unless the authorization is terminated or revoked.  Performed at [facility], [place] [address], [place], [place] [address]   Urine Culture     Status: None   Collection Time: 05/23/21  3:41 PM   Specimen: Urine, Catheterized  Result Value Ref Range Status   Specimen Description   Final    URINE, CATHETERIZED Performed at [place] [address], [place], [place] [address]    Special Requests   Final    NONE Performed at [facility], [place] [address] [place], [place] [address]    Culture   Final    NO GROWTH Performed at [facility], [place] [address], [place],  [address]    Report Status 05/24/2021 FINAL  Final         Radiology Studies: [facility] Chest Port 1V same Day  Result Date: 05/29/2021 CLINICAL DATA:  Acute hypoxemic respiratory failure. Productive cough. COVID positive. EXAM: PORTABLE CHEST 1 VIEW COMPARISON:  Chest x-ray 05/26/2021 FINDINGS: The heart size and mediastinal contours are unchanged. Aortic calcification. Elevated left hemidiaphragm. Associated left base atelectasis. No focal consolidation. No pulmonary edema. No pleural effusion. No pneumothorax. No acute osseous abnormality. IMPRESSION: No acute cardiopulmonary abnormality. Electronically Signed   By: Iven Finn M.D.   On: 05/29/2021 21:01        Scheduled Meds:  vitamin C  500 mg Oral  Daily   enoxaparin (LOVENOX) injection  30 mg Subcutaneous Q24H   feeding supplement  1 Container Oral TID BM   fluticasone  2 spray Each Nare Daily   guaiFENesin  1,200 mg Oral BID   insulin aspart  0-9 Units Subcutaneous Q6H   Ipratropium-Albuterol  1 puff Inhalation TID   loratadine  10 mg Oral Daily   mouth  rinse  15 mL Mouth Rinse BID   metoprolol tartrate  2.5 mg Intravenous Q8H   mometasone-formoterol  2 puff Inhalation BID   multivitamin with minerals  1 tablet Oral Daily   nicotine  14 mg Transdermal Daily   pantoprazole  40 mg Oral Daily   polyethylene glycol  17 g Oral BID   senna-docusate  1 tablet Oral BID   simethicone  160 mg Oral QID   sorbitol  30 mL Oral Once   zinc sulfate  220 mg Oral Daily   Continuous Infusions:   LOS: 9 days    Time spent: over 30 min    Fayrene Helper, MD [facility]   To contact the attending provider between 7A-7P or the covering provider during after hours 7P-7A, please log into the web site [email or web address] and access using universal [place] password for that web site. If you do not have the password, please call the hospital operator.  05/31/2021, 5:20 PM

## 2021-05-31 NOTE — Progress Notes (Signed)
Pt called c/o SOB, pt seen & assessed, she looks  relaxed, no wheezing. 02 sat 95% on 2L.Prn Albuterol inhaler given. Pt resting well when left the room.

## 2021-06-01 DIAGNOSIS — U071 COVID-19: Secondary | ICD-10-CM | POA: Diagnosis not present

## 2021-06-01 DIAGNOSIS — J9601 Acute respiratory failure with hypoxia: Secondary | ICD-10-CM | POA: Diagnosis not present

## 2021-06-01 LAB — PHOSPHORUS: Phosphorus: 3 mg/dL (ref 2.5–4.6)

## 2021-06-01 LAB — CBC WITH DIFFERENTIAL/PLATELET
Abs Immature Granulocytes: 0.07 10*3/uL (ref 0.00–0.07)
Basophils Absolute: 0 10*3/uL (ref 0.0–0.1)
Basophils Relative: 0 %
Eosinophils Absolute: 0 10*3/uL (ref 0.0–0.5)
Eosinophils Relative: 0 %
HCT: 37.5 % (ref 36.0–46.0)
Hemoglobin: 11.9 g/dL — ABNORMAL LOW (ref 12.0–15.0)
Immature Granulocytes: 1 %
Lymphocytes Relative: 13 %
Lymphs Abs: 1.2 10*3/uL (ref 0.7–4.0)
MCH: 28.4 pg (ref 26.0–34.0)
MCHC: 31.7 g/dL (ref 30.0–36.0)
MCV: 89.5 fL (ref 80.0–100.0)
Monocytes Absolute: 0.9 10*3/uL (ref 0.1–1.0)
Monocytes Relative: 9 %
Neutro Abs: 7.2 10*3/uL (ref 1.7–7.7)
Neutrophils Relative %: 77 %
Platelets: 260 10*3/uL (ref 150–400)
RBC: 4.19 MIL/uL (ref 3.87–5.11)
RDW: 14.3 % (ref 11.5–15.5)
WBC: 9.4 10*3/uL (ref 4.0–10.5)
nRBC: 0 % (ref 0.0–0.2)

## 2021-06-01 LAB — COMPREHENSIVE METABOLIC PANEL
ALT: 18 U/L (ref 0–44)
AST: 17 U/L (ref 15–41)
Albumin: 2.8 g/dL — ABNORMAL LOW (ref 3.5–5.0)
Alkaline Phosphatase: 51 U/L (ref 38–126)
Anion gap: 6 (ref 5–15)
BUN: 10 mg/dL (ref 8–23)
CO2: 29 mmol/L (ref 22–32)
Calcium: 8.4 mg/dL — ABNORMAL LOW (ref 8.9–10.3)
Chloride: 100 mmol/L (ref 98–111)
Creatinine, Ser: 0.61 mg/dL (ref 0.44–1.00)
GFR, Estimated: >60 mL/min (ref >60)
Glucose, Bld: 135 mg/dL — ABNORMAL HIGH (ref 70–99)
Potassium: 3.7 mmol/L (ref 3.5–5.1)
Sodium: 135 mmol/L (ref 135–145)
Total Bilirubin: 0.5 mg/dL (ref 0.3–1.2)
Total Protein: 5.4 g/dL — ABNORMAL LOW (ref 6.5–8.1)

## 2021-06-01 LAB — C-REACTIVE PROTEIN: CRP: 3.6 mg/dL — ABNORMAL HIGH (ref <1.0)

## 2021-06-01 LAB — GLUCOSE, CAPILLARY
Glucose-Capillary: 129 mg/dL — ABNORMAL HIGH (ref 70–99)
Glucose-Capillary: 141 mg/dL — ABNORMAL HIGH (ref 70–99)

## 2021-06-01 LAB — MAGNESIUM: Magnesium: 2 mg/dL (ref 1.7–2.4)

## 2021-06-01 LAB — D-DIMER, QUANTITATIVE: D-Dimer, Quant: 0.38 ug/mL-FEU (ref 0.00–0.50)

## 2021-06-01 MED ORDER — SPIRIVA HANDIHALER 18 MCG IN CAPS: 18.0000 ug | ORAL_CAPSULE | Freq: Every day | RESPIRATORY_TRACT | 12 refills | Status: DC

## 2021-06-01 MED ORDER — ALBUTEROL SULFATE HFA 108 (90 BASE) MCG/ACT IN AERS: 2.0000 | INHALATION_SPRAY | RESPIRATORY_TRACT | 0 refills | Status: DC | PRN

## 2021-06-01 NOTE — Progress Notes (Signed)
SATURATION QUALIFICATIONS: (This note is used to comply with regulatory documentation for home oxygen)  Patient Saturations on Room Air at Rest = 93%  Patient Saturations on Room Air while Ambulating = 90%   Patient Saturations on  Liters of oxygen while Ambulating = N/A  Please briefly explain why patient needs home oxygen:

## 2021-06-01 NOTE — Plan of Care (Signed)
  Problem: Clinical Measurements: Goal: Respiratory complications will improve Outcome: Adequate for Discharge   Problem: Activity: Goal: Risk for activity intolerance will decrease Outcome: Adequate for Discharge   Problem: Nutrition: Goal: Adequate nutrition will be maintained Outcome: Adequate for Discharge   Problem: Skin Integrity: Goal: Risk for impaired skin integrity will decrease Outcome: Adequate for Discharge

## 2021-06-01 NOTE — Progress Notes (Signed)
Pt complains of trouble breathing. auscultated lungs- course and diminished lung sounds with crackles/ronchi in all lung fields. Increased oxygen from 2 liters to 4 liters. Also administered prn albuterol inhaler. Will continue to monitor.

## 2021-06-01 NOTE — Discharge Summary (Addendum)
Physician Discharge Summary  NEMIAH LEDEZMA [id number] DOB: 1941/04/08 DOA: 05/22/2021  PCP: Lance Sell, NP  Admit date: 05/22/2021 Discharge date: 06/01/2021  Time spent: 40 minutes  Recommendations for Outpatient Follow-up:  Follow outpatient CBC/CMP Follow COPD meds outaptient - discharged with spiriva Follow pulmonary nodule outpatient, needs repeat imaging Follow prediabetes outpatient Follow blood pressure outpatient   Discharge Diagnoses:  Principal Problem:   Acute hypoxemic respiratory failure due to COVID-19 [facility]) Active Problems:   Tobacco abuse   Essential hypertension, benign   Hypokalemia   COPD exacerbation ([place])   COVID-19 virus infection   Hyperglycemia   Partial small bowel obstruction ([place])   Discharge Condition: stable  Diet recommendation: heart healthy  Filed Weights   05/30/21 0500 05/31/21 0500 06/01/21 0500  Weight: 41.6 kg 41.2 kg 41.8 kg    History of present illness:  80 year old female with Kyrstin Campillo history of hypertension, schizoaffective disorder, COPD, ongoing tobacco abuse presented with shortness of breath.  She was found to be hypoxemic with O2 sats 80% on room air.  SARS-CoV-2 PCR was positive.  During hospitalization patient developed ileus vs partial SBO, which has now resolved.  She's improved after medical treatment for Shonteria Abeln COVID 19 infection and COPD exacerbation. SNF recommended, but patient preferred to go home.  See below for additional details  Hospital Course:  Acute hypoxemic respiratory failure  Covid 19 Virus Infection  COPD Exacerbation -Secondary to COPD exacerbation/COVID-19 infection -s/p ceftriaxone 7/22-27 -completed remdesivir -steroids completed - she's been weaned from supplemental O2.  Will d/c with spiriva for COPD.  Also albuterol.  Follow outpatient.  -strict I/O, daily weights -inhalers  COVID-19 Labs  Recent Labs    06/01/21 0342  DDIMER 0.38  CRP 3.6*    Lab Results  Component Value Date    SARSCOV2NAA POSITIVE ([place]) 05/22/2021   Ileus -pSBO vs ileus 2/2 infection -Seen by general surgery -General surgery has signed off, advance to soft diet.   Hypertension - resume amlodipine   Hypomagnesemia -improved   Hypokalemia -improved  Prediabetes - follow outpatient, A1c 6.3  Pulmonary nodule - will need follow up imaging   Procedures: Echo IMPRESSIONS     1. Left ventricular ejection fraction, by estimation, is 60 to 65%. The  left ventricle has normal function. The left ventricle has no regional  wall motion abnormalities. There is moderate asymmetric left ventricular  hypertrophy of the basal-septal  segment. Left ventricular diastolic parameters are consistent with Grade I  diastolic dysfunction (impaired relaxation).   2. Right ventricular systolic function is normal. The right ventricular  size is normal. Tricuspid regurgitation signal is inadequate for assessing  PA pressure.   3. The mitral valve is normal in structure. Mild mitral valve  regurgitation.   4. The aortic valve was not well visualized. Aortic valve regurgitation  is not visualized. No aortic stenosis is present.   5. The inferior vena cava is normal in size with greater than 50%  respiratory variability, suggesting right atrial pressure of 3 mmHg.   Consultations: none  Discharge Exam: Vitals:   06/01/21 1100 06/01/21 1203  BP: (!) 146/70 (!) 145/78  Pulse: 79 81  Resp: 20 18  Temp: 98.3 F (36.8 C) 98.5 F (36.9 C)  SpO2: 93% 92%   No new complaints  Comfortable with discharging home No answer on phone to daughter in law, no contact for son Called son back, discussed d/c plan  General: No acute distress. Cardiovascular: Heart sounds show Shakti Fleer regular rate, and  rhythm. Lungs: Clear to auscultation bilaterally  Abdomen: Soft, nontender, nondistended  Neurological: Alert and oriented 3. Moves all extremities 4 . Cranial nerves II through XII grossly intact. Skin: Warm and  dry. No rashes or lesions. Extremities: No clubbing or cyanosis. No edema.   Discharge Instructions   Discharge Instructions     Call MD for:  difficulty breathing, headache or visual disturbances   Complete by: As directed    Call MD for:  extreme fatigue   Complete by: As directed    Call MD for:  hives   Complete by: As directed    Call MD for:  persistant dizziness or light-headedness   Complete by: As directed    Call MD for:  persistant nausea and vomiting   Complete by: As directed    Call MD for:  redness, tenderness, or signs of infection (pain, swelling, redness, odor or green/yellow discharge around incision site)   Complete by: As directed    Call MD for:  severe uncontrolled pain   Complete by: As directed    Call MD for:  temperature >100.4   Complete by: As directed    Diet - low sodium heart healthy   Complete by: As directed    Discharge instructions   Complete by: As directed    You were seen for Vasilisa Vore COVID 19 virus infection and COPD exacerbation.  You've improved after steroids, antibiotics, and remdesivir.  You've been weaned to room air on the day of discharge.  Your isolation will be complete after today.  We'll send you home with Montie Gelardi new controller medicine called spiriva, take this daily.  We'll also send you with albuterol to use as needed.  You had an ileus (slowed bowel function) due to your illness.  This has improved.  You have Braidyn Peace pulmonary nodule which will need follow up with repeat imaging within 3 months.   Therapy recommended [facility] skilled [facility] facility for rehab, but you were not interested in this.  We'll try to help with home health services.  Return for new, recurrent, or worsening symptoms.  Please ask your PCP to request records from this hospitalization so they know what was done and what the next steps will be.   Increase activity slowly   Complete by: As directed       Allergies as of 06/01/2021   No Known Allergies      Medication  List     STOP taking these medications    azithromycin 250 MG tablet Commonly known as: Zithromax Z-Pak   predniSONE 50 MG tablet Commonly known as: DELTASONE       TAKE these medications    albuterol 108 (90 Base) MCG/ACT inhaler Commonly known as: VENTOLIN HFA Inhale 2 puffs into the lungs every 4 (four) hours as needed for wheezing or shortness of breath.   amLODipine 10 MG tablet Commonly known as: NORVASC TAKE 1 TABLET(10 MG) BY MOUTH DAILY What changed: See the new instructions.   Spiriva HandiHaler 18 MCG inhalation capsule Generic drug: tiotropium Place 1 capsule (18 mcg total) into inhaler and inhale daily.       No Known Allergies  Follow-up Information     [facility] Follow up.   Specialty: Home Health Services Why: Will follow you at home with Home Health. Contact information: [place] [address] [phone]                  The results of  significant diagnostics from this hospitalization (including imaging, microbiology, ancillary and laboratory) are listed below for reference.    Significant Diagnostic Studies: CT ABDOMEN PELVIS WO CONTRAST  Result Date: 05/24/2021 CLINICAL DATA:  Inpatient. Generalized abdominal pain, abdominal distension and excessive gas. EXAM: CT ABDOMEN AND PELVIS WITHOUT CONTRAST TECHNIQUE: Multidetector CT imaging of the abdomen and pelvis was performed following the standard protocol without IV contrast. COMPARISON:  05/23/2021 abdominal radiograph. 06/19/2016 CT abdomen/pelvis. FINDINGS: Lower chest: Indistinct 1.0 cm sub solid nodular opacity in the posterior left lower lobe (series 4/image 13). Coronary atherosclerosis. Hepatobiliary: Normal liver size. No liver mass. Cholelithiasis. No gallbladder wall thickening. No gallbladder distention. No biliary ductal dilatation. Pancreas: Scattered tiny calcifications throughout periphery of the pancreas, favor vascular  calcifications. No pancreatic mass or significant duct dilation on these noncontrast images. Spleen: Normal size. No mass. Adrenals/Urinary Tract: Normal adrenals. No renal stones. No contour deforming renal masses. Mild fullness of the central right renal collecting system without overt right hydronephrosis. No left hydronephrosis. Small cystocele, unchanged. Otherwise normal bladder. Stomach/Bowel: Normal non-distended stomach. There are numerous moderately dilated mid small bowel loops with scattered small air-fluid levels throughout the abdomen and pelvis up to 4.7 cm diameter. The distal and terminal ileum are collapsed. No discrete small bowel caliber transition identified. No small bowel wall thickening or pneumatosis. Appendix not discretely visualized. No pericecal inflammatory changes. Moderate gas and mild stool throughout the large bowel with no large bowel wall thickening. Mild sigmoid diverticulosis without associated wall thickening or significant pericolonic fat stranding. Vascular/Lymphatic: Atherosclerotic nonaneurysmal abdominal aorta. No pathologically enlarged lymph nodes in the abdomen or pelvis. Reproductive: Stable right deviated small uterus. No adnexal masses. Other: No pneumoperitoneum. Small volume ascites. No focal fluid collection. Musculoskeletal: No aggressive appearing focal osseous lesions. Mild thoracolumbar spondylosis. IMPRESSION: 1. Several moderately dilated mid small bowel loops with small air-fluid levels throughout the abdomen and pelvis, with collapsed distal and terminal ileum. No discrete small bowel caliber transition identified. Findings are suggestive of Niza Soderholm partial mechanical mid to distal small bowel obstruction, although the differential includes generalized adynamic ileus given moderate colonic gas. No bowel wall thickening or pneumatosis. No free air. 2. Small volume ascites. 3. Indistinct 1.0 cm subsolid nodular opacity in the posterior left lower lobe. Follow-up  noncontrast chest CT recommended in 3 months. 4. Cholelithiasis. 5. Mild sigmoid diverticulosis. 6. Small cystocele. Fullness of the right renal collecting system without overt hydronephrosis. 7. Aortic Atherosclerosis (ICD10-I70.0). Electronically Signed   By: Ilona Sorrel M.D.   On: 05/24/2021 11:41   DG Chest 1 View  Result Date: 05/22/2021 CLINICAL DATA:  Shortness of breath EXAM: CHEST  1 VIEW COMPARISON:  04/09/2021 FINDINGS: hyperinflation with probable emphysema. No focal opacity or pleural effusion. Normal heart size. No pneumothorax. Chronic right-sided rib fractures. IMPRESSION: No active disease.  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   On: 05/22/2021 02:50   DG Abd 1 View  Result Date: 05/23/2021 CLINICAL DATA:  Pt complained of abdominal pain, nausea, and feeling the need to have Davione Lenker bowel movement but unable to pass any stool. Per nurse, pt has had Miralax, Senokot, Dulcolax suppository, and Geetika Laborde Fleets enema with minimal results. EXAM: ABDOMEN - 1 VIEW COMPARISON:  06/25/2016. FINDINGS: There is generalized increased bowel gas, both in the colon and small bowel, without significant bowel dilation. No increased colonic stool. Soft tissues are poorly defined due to the overlying bowel gas. Aortoiliac atherosclerotic vascular calcifications are noted. No evidence  of renal or ureteral stones. No acute skeletal abnormality. IMPRESSION: 1. No acute findings.  No evidence of bowel obstruction. 2. Generalized increased bowel gas, nonspecific. No increase in colonic stool burden. Electronically Signed   By: Lajean Manes M.D.   On: 05/23/2021 23:44   DG CHEST PORT 1 VIEW  Result Date: 05/26/2021 CLINICAL DATA:  Small bowel obstruction. EXAM: PORTABLE CHEST 1 VIEW COMPARISON:  05/22/2021 FINDINGS: Single view of the chest again demonstrates some hyperinflation of the lungs. Heart size is within normal limits and stable. Atherosclerotic calcifications at  the aortic arch. Few densities in the right lower chest could represent overlying structures. No significant airspace disease or lung consolidation. Lucency underneath the right hemidiaphragm is most compatible with bowel gas. IMPRESSION: No acute chest findings. Electronically Signed   By: Markus Daft M.D.   On: 05/26/2021 10:16   DG Chest Port 1V same Day  Result Date: 05/29/2021 CLINICAL DATA:  Acute hypoxemic respiratory failure. Productive cough. COVID positive. EXAM: PORTABLE CHEST 1 VIEW COMPARISON:  Chest x-ray 05/26/2021 FINDINGS: The heart size and mediastinal contours are unchanged. Aortic calcification. Elevated left hemidiaphragm. Associated left base atelectasis. No focal consolidation. No pulmonary edema. No pleural effusion. No pneumothorax. No acute osseous abnormality. IMPRESSION: No acute cardiopulmonary abnormality. Electronically Signed   By: Iven Finn M.D.   On: 05/29/2021 21:01   DG Abd Portable 1V-Small Bowel Obstruction Protocol-24 hr delay  Result Date: 05/28/2021 CLINICAL DATA:  Small bowel obstruction. EXAM: PORTABLE ABDOMEN - 1 VIEW COMPARISON:  May 27, 2021. FINDINGS: Mildly dilated small bowel loops are noted. Contrast is seen in the right and left colons. No radio-opaque calculi or other significant radiographic abnormality are seen. IMPRESSION: Residual contrast is seen in the right and left colons. Mildly dilated small bowel loops are noted. Electronically Signed   By: Marijo Conception M.D.   On: 05/28/2021 14:40   DG Abd Portable 1V-Small Bowel Obstruction Protocol-initial, 8 hr delay  Result Date: 05/27/2021 CLINICAL DATA:  80 year old female with 8 hour delayed film. EXAM: PORTABLE ABDOMEN - 1 VIEW COMPARISON:  Abdominal radiograph dated 05/27/2021. FINDINGS: Contrast noted in the pelvis, likely in the distal small bowel. There is also contrast in the proximal colon. Mild diffuse air distended small bowel loops. Osteopenia with degenerative changes of the spine.  No acute osseous pathology. IMPRESSION: Oral contrast traverses into the colon. Electronically Signed   By: Anner Crete M.D.   On: 05/27/2021 20:46   DG Abd Portable 1V  Result Date: 05/27/2021 CLINICAL DATA:  Abdominal distension EXAM: PORTABLE ABDOMEN - 1 VIEW COMPARISON:  05/26/2021, 05/24/2021 FINDINGS: Persistently distended loops of large and small bowel throughout the abdomen, similar in appearance to the previous studies. No gross free intraperitoneal air on portable supine view. IMPRESSION: Persistently distended large and small bowel throughout the abdomen, similar in appearance to previous studies. Electronically Signed   By: Davina Poke D.O.   On: 05/27/2021 12:26   DG Abd Portable 1V  Result Date: 05/25/2021 CLINICAL DATA:  Small bowel obstruction EXAM: PORTABLE ABDOMEN - 1 VIEW COMPARISON:  05/24/2021 CT.  Plain film 05/23/2021. FINDINGS: Continued gaseous distention of bowel, similar to prior CT. No organomegaly or free air. No acute bony abnormality. Visualized lung bases clear. IMPRESSION: Continued gaseous distention of bowel, not significantly changed since prior study. Electronically Signed   By: Rolm Baptise M.D.   On: 05/25/2021 08:23   DG Abd Portable 2V  Result Date: 05/26/2021 CLINICAL DATA:  Encounter for small  bowel obstruction. EXAM: PORTABLE ABDOMEN - 2 VIEW COMPARISON:  05/25/2021 FINDINGS: Large amount of bowel gas throughout the abdomen and pelvis. Lucency underneath the right hemidiaphragm is most compatible with dilated loops of bowel gas. The degree of bowel gas distension is similar to the previous examination. No evidence for free air. Degenerative changes in the left hip joint. IMPRESSION: 1. Persistent dilated gas-filled loops of bowel throughout the abdomen. No significant change from 05/25/2021. Findings remain concerning for Marquail Bradwell bowel obstruction. Electronically Signed   By: Markus Daft M.D.   On: 05/26/2021 10:19   ECHOCARDIOGRAM COMPLETE  Result  Date: 05/27/2021    ECHOCARDIOGRAM REPORT   Patient Name:   CECILIA MORLEY Date of Exam: 05/27/2021 Medical Rec #:  [id number]       Height:       65.0 in Accession #:    [id number]      Weight:       96.3 lb Date of Birth:  18-Mar-1941        BSA:          1.450 m Patient Age:    80 years        BP:           153/83 mmHg Patient Gender: F               HR:           57 bpm. Exam Location:  Inpatient Procedure: 2D Echo, Cardiac Doppler and Color Doppler Indications:    Dyspnea R06.00  History:        Patient has no prior history of Echocardiogram examinations.                 Risk Factors:Hypertension. COVID-19 Positive.  Sonographer:    Jonelle Sidle Dance Referring Phys: [place]  1. Left ventricular ejection fraction, by estimation, is 60 to 65%. The left ventricle has normal function. The left ventricle has no regional wall motion abnormalities. There is moderate asymmetric left ventricular hypertrophy of the basal-septal segment. Left ventricular diastolic parameters are consistent with Grade I diastolic dysfunction (impaired relaxation).  2. Right ventricular systolic function is normal. The right ventricular size is normal. Tricuspid regurgitation signal is inadequate for assessing PA pressure.  3. The mitral valve is normal in structure. Mild mitral valve regurgitation.  4. The aortic valve was not well visualized. Aortic valve regurgitation is not visualized. No aortic stenosis is present.  5. The inferior vena cava is normal in size with greater than 50% respiratory variability, suggesting right atrial pressure of 3 mmHg. FINDINGS  Left Ventricle: Left ventricular ejection fraction, by estimation, is 60 to 65%. The left ventricle has normal function. The left ventricle has no regional wall motion abnormalities. The left ventricular internal cavity size was small. There is moderate  asymmetric left ventricular hypertrophy of the basal-septal segment. Left ventricular diastolic parameters  are consistent with Grade I diastolic dysfunction (impaired relaxation). Right Ventricle: The right ventricular size is normal. No increase in right ventricular wall thickness. Right ventricular systolic function is normal. Tricuspid regurgitation signal is inadequate for assessing PA pressure. Left Atrium: Left atrial size was normal in size. Right Atrium: Right atrial size was normal in size. Pericardium: There is no evidence of pericardial effusion. Mitral Valve: The mitral valve is normal in structure. Mild mitral valve regurgitation. Tricuspid Valve: The tricuspid valve is normal in structure. Tricuspid valve regurgitation is trivial. Aortic Valve: The aortic valve was not well visualized. Aortic  valve regurgitation is not visualized. No aortic stenosis is present. Pulmonic Valve: The pulmonic valve was not well visualized. Pulmonic valve regurgitation is trivial. Aorta: The aortic root and ascending aorta are structurally normal, with no evidence of dilitation. Venous: The inferior vena cava is normal in size with greater than 50% respiratory variability, suggesting right atrial pressure of 3 mmHg. IAS/Shunts: The interatrial septum was not well visualized.  LEFT VENTRICLE PLAX 2D LVIDd:         3.50 cm  Diastology LVIDs:         2.30 cm  LV e' medial:    4.13 cm/s LV PW:         1.10 cm  LV E/e' medial:  13.5 LV IVS:        0.90 cm  LV e' lateral:   4.35 cm/s LVOT diam:     1.80 cm  LV E/e' lateral: 12.8 LV SV:         62 LV SV Index:   43 LVOT Area:     2.54 cm  RIGHT VENTRICLE             IVC RV Basal diam:  2.20 cm     IVC diam: 1.60 cm RV S prime:     12.70 cm/s TAPSE (M-mode): 1.9 cm LEFT ATRIUM             Index       RIGHT ATRIUM          Index LA diam:        3.50 cm 2.41 cm/m  RA Area:     6.77 cm LA Vol (A2C):   45.1 ml 31.10 ml/m RA Volume:   10.70 ml 7.38 ml/m LA Vol (A4C):   18.5 ml 12.76 ml/m LA Biplane Vol: 31.6 ml 21.79 ml/m  AORTIC VALVE LVOT Vmax:   118.00 cm/s LVOT Vmean:  73.500  cm/s LVOT VTI:    0.245 m  AORTA Ao Root diam: 3.70 cm Ao Asc diam:  3.50 cm MITRAL VALVE MV Area (PHT): 3.27 cm    SHUNTS MV Decel Time: 232 msec    Systemic VTI:  0.24 m MV E velocity: 55.70 cm/s  Systemic Diam: 1.80 cm MV [name] velocity: 66.10 cm/s MV E/Bowden Boody ratio:  0.84 Oswaldo Milian MD Electronically signed by Oswaldo Milian MD Signature Date/Time: 05/27/2021/4:06:26 PM    Final     Microbiology: Recent Results (from the past 240 hour(s))  Urine Culture     Status: None   Collection Time: 05/23/21  3:41 PM   Specimen: Urine, Catheterized  Result Value Ref Range Status   Specimen Description   Final    URINE, CATHETERIZED Performed at [facility], [place] [address] [place], [place] [address]    Special Requests   Final    NONE Performed at [facility], [place] [address], [place], [place] [address]    Culture   Final    NO GROWTH Performed at [facility], [place] [address], [place], [place] [address]    Report Status 05/24/2021 FINAL  Final     Labs: Basic Metabolic Panel: Recent Labs  Lab 05/26/21 0413 05/27/21 0400 05/29/21 0409 05/31/21 0438 06/01/21 0342  NA 137 137 134* 136 135  K 4.4 3.4* 3.7 3.8 3.7  CL 101 95* 99 100 100  CO2 '26 29 28 27 29  '$ GLUCOSE 118* 121* 84 98 135*  BUN '12 20 16 9 10  '$ CREATININE 0.48  0.50 0.49 0.46 0.61  CALCIUM 8.7* 8.9 8.5* 8.4* 8.4*  MG 1.9  --  2.3  --  2.0  PHOS  --   --  3.3  --  3.0   Liver Function Tests: Recent Labs  Lab 05/26/21 0413 05/27/21 0400 06/01/21 0342  AST '28 22 17  '$ ALT '20 23 18  '$ ALKPHOS 46 49 51  BILITOT 0.6 0.7 0.5  PROT 5.4* 5.7* 5.4*  ALBUMIN 3.2* 3.4* 2.8*   No results for input(s): LIPASE, AMYLASE in the last 168 hours. No results for input(s): AMMONIA in the last 168 hours. CBC: Recent Labs  Lab 05/26/21 0413 05/27/21 0400 05/29/21 0409 06/01/21 0342  WBC 9.2 7.7 10.6* 9.4  NEUTROABS 7.9* 6.6 8.1* 7.2  HGB 13.0 13.1 12.5 11.9*  HCT 40.5 40.2 38.5  37.5  MCV 87.3 86.8 86.7 89.5  PLT 233 242 260 260   Cardiac Enzymes: No results for input(s): CKTOTAL, CKMB, CKMBINDEX, TROPONINI in the last 168 hours. BNP: BNP (last 3 results) Recent Labs    05/22/21 0220 05/26/21 0413 05/27/21 0400  BNP 27.5 543.9* 125.0*    ProBNP (last 3 results) No results for input(s): PROBNP in the last 8760 hours.  CBG: Recent Labs  Lab 05/31/21 1234 05/31/21 1710 05/31/21 2346 06/01/21 0546 06/01/21 1200  GLUCAP 142* 146* 160* 129* 141*       Signed:  Fayrene Helper MD.  [facility] 06/01/2021, 1:31 PM

## 2021-06-01 NOTE — TOC Progression Note (Signed)
Transition of Care [facility]) - Progression Note    Patient Details  Name: Brenda Gibson MRN: [id number] Date of Birth: December 23, 1940  Transition of [facility]) CM/SW Contact  Purcell Mouton, RN Phone Number: 06/01/2021, 1:50 PM  Clinical Narrative:    Pt will discharge home with [facility] and transport by [facility] to home.    Expected Discharge Plan: Home/Self Care Barriers to Discharge: No Barriers Identified  Expected Discharge Plan and Services Expected Discharge Plan: Home/Self Care       Living arrangements for the past 2 months: Single Family Home Expected Discharge Date: 06/01/21                                     Social Determinants of Health (SDOH) Interventions    Readmission Risk Interventions No flowsheet data found.

## 2021-07-09 ENCOUNTER — Emergency Department (HOSPITAL_COMMUNITY)
Admission: EM | Admit: 2021-07-09 | Discharge: 2021-07-09 | Disposition: A | Discharge status: Home / Self Care | Payer: Medicare Other | Attending: Emergency Medicine | Admitting: Emergency Medicine

## 2021-07-09 ENCOUNTER — Emergency Department (HOSPITAL_COMMUNITY): Payer: Medicare Other

## 2021-07-09 DIAGNOSIS — F17210 Nicotine dependence, cigarettes, uncomplicated: Secondary | ICD-10-CM | POA: Insufficient documentation

## 2021-07-09 DIAGNOSIS — J441 Chronic obstructive pulmonary disease with (acute) exacerbation: Secondary | ICD-10-CM | POA: Insufficient documentation

## 2021-07-09 DIAGNOSIS — Z20822 Contact with and (suspected) exposure to covid-19: Secondary | ICD-10-CM | POA: Diagnosis not present

## 2021-07-09 DIAGNOSIS — I10 Essential (primary) hypertension: Secondary | ICD-10-CM | POA: Insufficient documentation

## 2021-07-09 DIAGNOSIS — Z79899 Other long term (current) drug therapy: Secondary | ICD-10-CM | POA: Insufficient documentation

## 2021-07-09 DIAGNOSIS — J439 Emphysema, unspecified: Secondary | ICD-10-CM | POA: Diagnosis not present

## 2021-07-09 DIAGNOSIS — R0602 Shortness of breath: Secondary | ICD-10-CM | POA: Diagnosis not present

## 2021-07-09 LAB — BASIC METABOLIC PANEL
Anion gap: 12 (ref 5–15)
BUN: 11 mg/dL (ref 8–23)
CO2: 25 mmol/L (ref 22–32)
Calcium: 9.1 mg/dL (ref 8.9–10.3)
Chloride: 102 mmol/L (ref 98–111)
Creatinine, Ser: 0.56 mg/dL (ref 0.44–1.00)
GFR, Estimated: >60 mL/min (ref >60)
Glucose, Bld: 92 mg/dL (ref 70–99)
Potassium: 3.4 mmol/L — ABNORMAL LOW (ref 3.5–5.1)
Sodium: 139 mmol/L (ref 135–145)

## 2021-07-09 LAB — CBC WITH DIFFERENTIAL/PLATELET
Abs Immature Granulocytes: 0.03 10*3/uL (ref 0.00–0.07)
Basophils Absolute: 0 10*3/uL (ref 0.0–0.1)
Basophils Relative: 1 %
Eosinophils Absolute: 0 10*3/uL (ref 0.0–0.5)
Eosinophils Relative: 0 %
HCT: 40.5 % (ref 36.0–46.0)
Hemoglobin: 12.6 g/dL (ref 12.0–15.0)
Immature Granulocytes: 0 %
Lymphocytes Relative: 14 %
Lymphs Abs: 1.1 10*3/uL (ref 0.7–4.0)
MCH: 28.3 pg (ref 26.0–34.0)
MCHC: 31.1 g/dL (ref 30.0–36.0)
MCV: 91 fL (ref 80.0–100.0)
Monocytes Absolute: 0.4 10*3/uL (ref 0.1–1.0)
Monocytes Relative: 5 %
Neutro Abs: 6.5 10*3/uL (ref 1.7–7.7)
Neutrophils Relative %: 80 %
Platelets: 173 10*3/uL (ref 150–400)
RBC: 4.45 MIL/uL (ref 3.87–5.11)
RDW: 14.6 % (ref 11.5–15.5)
WBC: 8.2 10*3/uL (ref 4.0–10.5)
nRBC: 0 % (ref 0.0–0.2)

## 2021-07-09 LAB — POC SARS CORONAVIRUS 2 AG -  ED: SARSCOV2ONAVIRUS 2 AG: NEGATIVE

## 2021-07-09 MED ORDER — MAGNESIUM SULFATE 2 GM/50ML IV SOLN
2.0000 g | Freq: Once | INTRAVENOUS | Status: AC
  Administered 2021-07-09: 2 g via INTRAVENOUS
  Filled 2021-07-09: qty 50

## 2021-07-09 MED ORDER — ALBUTEROL SULFATE HFA 108 (90 BASE) MCG/ACT IN AERS
2.0000 | INHALATION_SPRAY | RESPIRATORY_TRACT | Status: DC | PRN
  Filled 2021-07-09: qty 6.7

## 2021-07-09 MED ORDER — METHYLPREDNISOLONE SODIUM SUCC 125 MG IJ SOLR
125.0000 mg | Freq: Once | INTRAMUSCULAR | Status: AC
  Administered 2021-07-09: 125 mg via INTRAVENOUS
  Filled 2021-07-09: qty 2

## 2021-07-09 NOTE — ED Notes (Signed)
Pt's son arrived and states they have to leave. Md aware. Pt 98% on RA. Pt walks with steady gait to lobby.

## 2021-07-09 NOTE — ED Triage Notes (Signed)
Ems bring pt in from a hotel today for shortness of breath. Pt was 97% on room air when ems arrived.

## 2021-07-09 NOTE — ED Provider Notes (Signed)
[facility] DEPT Provider Note   CSN: [id number] Arrival date & time: 07/09/21  0820     History Chief Complaint  Patient presents with   Shortness of Breath    Brenda Gibson is a 80 y.o. female.  Patient complains of shortness of breath.  Patient has a history of COPD  The history is provided by the patient and medical records. No language interpreter was used.  Shortness of Breath Severity:  Moderate Onset quality:  Sudden Timing:  Constant Progression:  Waxing and waning Chronicity:  New Context: not activity   Relieved by:  Nothing Worsened by:  Nothing Ineffective treatments:  None tried Associated symptoms: no abdominal pain, no chest pain, no cough, no headaches and no rash       Past Medical History:  Diagnosis Date   Basal cell carcinoma of ala nasi    Cataract    Hypertension    Knee pain, bilateral    Measles    Myalgia    Seborrheic dermatitis of scalp    Varicella     Patient Active Problem List   Diagnosis Date Noted   Partial small bowel obstruction ([place]) 05/24/2021   Acute hypoxemic respiratory failure due to COVID-19 ([place]) 05/22/2021   COPD exacerbation ([place]) 05/22/2021   COVID-19 virus infection 05/22/2021   Hyperglycemia 05/22/2021   Closed fracture of left tibial plateau 08/14/2020   Physical deconditioning 07/30/2016   Small bowel obstruction (HCC)    Hypokalemia 07/04/2016   Hypomagnesemia 07/04/2016   Urinary retention 07/04/2016   Ileus, postoperative ([place]) 07/02/2016   Malnutrition of moderate degree 06/28/2016   SBO (small bowel obstruction) s/p lysis of adhesion 06/25/2016 06/21/2016   Enteritis 06/21/2016   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   Elevated blood pressure reading without diagnosis of hypertension 06/19/2016   Medicare annual wellness visit, subsequent 02/23/2016   Female bladder prolapse 09/03/2014   Routine general medical examination at a health care facility 08/12/2014    Osteoporosis 08/12/2014   Overactive bladder 08/02/2013   Essential hypertension, benign 06/05/2013   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 '09 (Dr. Dolores Lory)   LAPAROTOMY N/A 06/25/2016   Procedure: EXPLORATORY LAPAROTOMY, lysis of adhesion;  Surgeon: Coralie Keens, MD;  Location: [facility];  Service: General;  Laterality: N/A;   LYSIS OF ADHESION  06/25/2016   Dr Nedra Hai     OB History   No obstetric history on file.     Family History  Problem Relation Age of Onset   Alcohol abuse Mother    Cancer Father 55       stomach cancer   Prostate cancer Father    Hypertension Maternal Grandfather    Diabetes Neg Hx    Heart disease Neg Hx     Social History   Tobacco Use   Smoking status: Every Day    Packs/day: 0.60    Years: 52.00    Pack years: 31.20    Types: Cigarettes   Smokeless tobacco: Never   Tobacco comments:    Pt smokes 5 cigarettes per day  Vaping Use   Vaping Use: Never used  Substance Use Topics   Alcohol use: Yes    Comment: rare use   Drug use:  No    Home Medications Prior to Admission medications   Medication Sig Start Date End Date Taking? Authorizing Provider  albuterol (VENTOLIN HFA) 108 (90 Base) MCG/ACT inhaler Inhale 2 puffs into the lungs every 4 (four) hours as needed for wheezing or shortness of breath. 06/01/21   Elodia Florence., MD  amLODipine (NORVASC) 10 MG tablet TAKE 1 TABLET(10 MG) BY MOUTH DAILY 03/16/20   Plotnikov, Evie Lacks, MD  tiotropium (SPIRIVA HANDIHALER) 18 MCG inhalation capsule Place 1 capsule (18 mcg total) into inhaler and inhale daily. 06/01/21   Elodia Florence., MD    Allergies    Patient has no known allergies.  Review of Systems   Review of Systems  Constitutional:  Negative  for appetite change and fatigue.  HENT:  Negative for congestion, ear discharge and sinus pressure.   Eyes:  Negative for discharge.  Respiratory:  Positive for shortness of breath. Negative for cough.   Cardiovascular:  Negative for chest pain.  Gastrointestinal:  Negative for abdominal pain and diarrhea.  Genitourinary:  Negative for frequency and hematuria.  Musculoskeletal:  Negative for back pain.  Skin:  Negative for rash.  Neurological:  Negative for seizures and headaches.  Psychiatric/Behavioral:  Negative for hallucinations.    Physical Exam Updated Vital Signs BP (!) 164/76   Pulse 65   Temp 97.8 F (36.6 C) (Oral)   Resp 18   Ht '5\' 5"'$  (1.651 m)   Wt 45.4 kg   SpO2 98%   BMI 16.64 kg/m   Physical Exam Vitals and nursing note reviewed.  Constitutional:      Appearance: She is well-developed.  HENT:     Head: Normocephalic.     Nose: Nose normal.  Eyes:     General: No scleral icterus.    Conjunctiva/sclera: Conjunctivae normal.  Neck:     Thyroid: No thyromegaly.  Cardiovascular:     Rate and Rhythm: Normal rate and regular rhythm.     Heart sounds: No murmur heard.   No friction rub. No gallop.  Pulmonary:     Breath sounds: No stridor. Wheezing present. No rales.  Chest:     Chest wall: No tenderness.  Abdominal:     General: There is no distension.     Tenderness: There is no abdominal tenderness. There is no rebound.  Musculoskeletal:        General: Normal range of motion.     Cervical back: Neck supple.  Lymphadenopathy:     Cervical: No cervical adenopathy.  Skin:    Findings: No erythema or rash.  Neurological:     Mental Status: She is alert and oriented to person, place, and time.     Motor: No abnormal muscle tone.     Coordination: Coordination normal.  Psychiatric:        Behavior: Behavior normal.    ED Results / Procedures / Treatments   Labs (all labs ordered are listed, but only abnormal results are displayed) Labs Reviewed   BASIC METABOLIC PANEL - Abnormal; Notable for the following components:      Result Value   Potassium 3.4 (*)    All other components within normal limits  CBC WITH DIFFERENTIAL/PLATELET  POC SARS CORONAVIRUS 2 AG -  ED    EKG None  Radiology DG Chest Port 1 View  Result Date: 07/09/2021 CLINICAL DATA:  sob EXAM: PORTABLE CHEST 1 VIEW COMPARISON:  None. FINDINGS: The lungs are hyperinflated. The cardiomediastinal silhouette is within normal  limits. No pleural effusion. No pneumothorax. No mass or consolidation. Healed right-sided rib fracture IMPRESSION: Emphysematous changes without superimposed focal acute process in the lungs. Electronically Signed   By: Albin Felling M.D.   On: 07/09/2021 09:54    Procedures Procedures   Medications Ordered in ED Medications  albuterol (VENTOLIN HFA) 108 (90 Base) MCG/ACT inhaler 2 puff (has no administration in time range)  methylPREDNISolone sodium succinate (SOLU-MEDROL) 125 mg/2 mL injection 125 mg (125 mg Intravenous Given 07/09/21 0931)  magnesium sulfate IVPB 2 g 50 mL (0 g Intravenous Stopped 07/09/21 1033)    ED Course  I have reviewed the triage vital signs and the nursing notes.  Pertinent labs & imaging results that were available during my care of the patient were reviewed by me and considered in my medical decision making (see chart for details).    MDM Rules/Calculators/A&P                           Patient was treated for COPD exacerbation.  She left AMA before I was able to discuss results of her tests Final Clinical Impression(s) / ED Diagnoses Final diagnoses:  COPD exacerbation [facility])    Rx / DC Orders ED Discharge Orders     None        Milton Ferguson, MD 07/11/21 1258

## 2021-07-21 ENCOUNTER — Emergency Department (HOSPITAL_COMMUNITY)
Admission: EM | Admit: 2021-07-21 | Discharge: 2021-07-21 | Disposition: A | Discharge status: Home / Self Care | Payer: Medicare Other | Attending: Physician Assistant | Admitting: Physician Assistant

## 2021-07-21 ENCOUNTER — Emergency Department (HOSPITAL_COMMUNITY): Payer: Medicare Other

## 2021-07-21 ENCOUNTER — Encounter (HOSPITAL_COMMUNITY): Payer: Self-pay | Admitting: Emergency Medicine

## 2021-07-21 ENCOUNTER — Other Ambulatory Visit: Payer: Self-pay

## 2021-07-21 DIAGNOSIS — Z5321 Procedure and treatment not carried out due to patient leaving prior to being seen by health care provider: Secondary | ICD-10-CM | POA: Insufficient documentation

## 2021-07-21 DIAGNOSIS — M533 Sacrococcygeal disorders, not elsewhere classified: Secondary | ICD-10-CM | POA: Insufficient documentation

## 2021-07-21 DIAGNOSIS — S40812A Abrasion of left upper arm, initial encounter: Secondary | ICD-10-CM | POA: Diagnosis not present

## 2021-07-21 DIAGNOSIS — Z23 Encounter for immunization: Secondary | ICD-10-CM | POA: Diagnosis not present

## 2021-07-21 DIAGNOSIS — W19XXXA Unspecified fall, initial encounter: Secondary | ICD-10-CM | POA: Diagnosis not present

## 2021-07-21 DIAGNOSIS — M545 Low back pain, unspecified: Secondary | ICD-10-CM | POA: Diagnosis not present

## 2021-07-21 DIAGNOSIS — S4992XA Unspecified injury of left shoulder and upper arm, initial encounter: Secondary | ICD-10-CM | POA: Diagnosis present
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
## 2021-07-25 ENCOUNTER — Other Ambulatory Visit: Payer: Self-pay | Admitting: Internal Medicine

## 2021-07-25 DIAGNOSIS — I10 Essential (primary) hypertension: Secondary | ICD-10-CM

## 2021-08-03 IMAGING — DX DG CHEST 1V PORT
1 series · 1 of 1 positions shown · non-contrast
Comparison: 05/22/2021

CLINICAL DATA: Small bowel obstruction.

EXAM:
PORTABLE CHEST 1 VIEW

[chest ap]
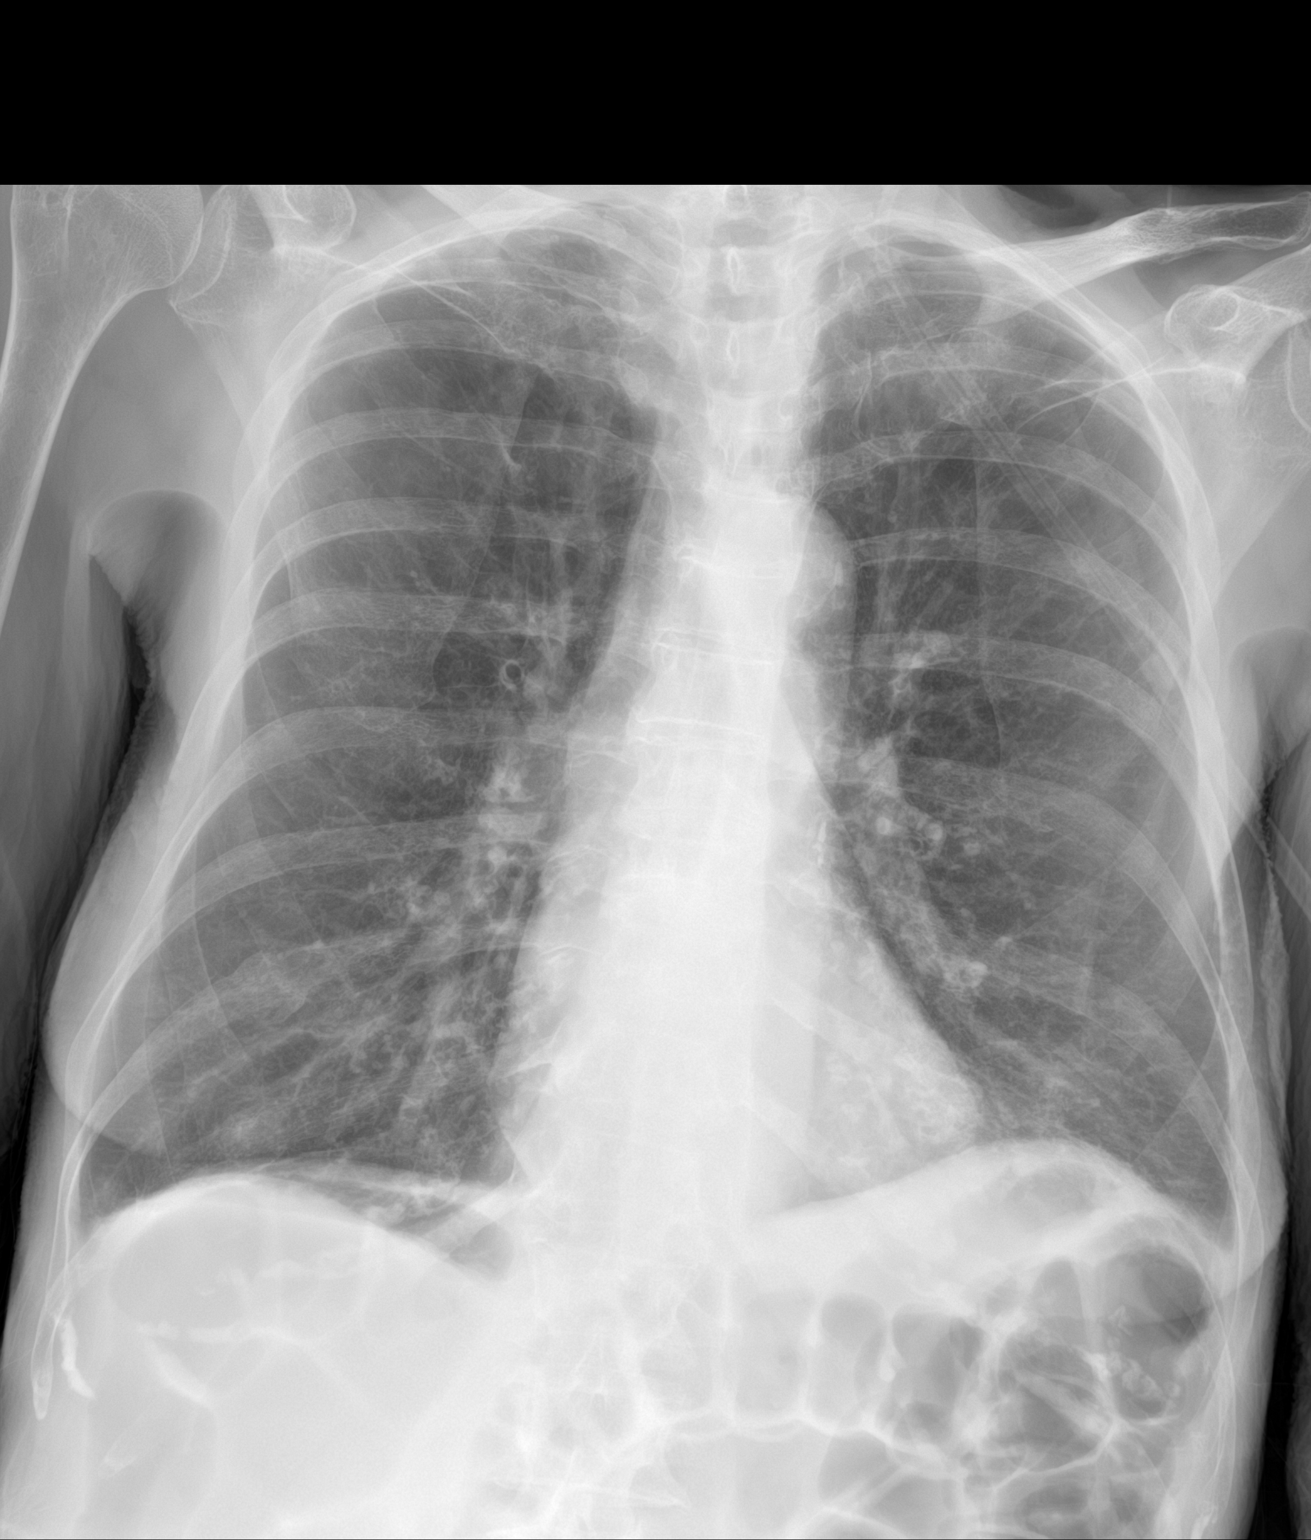

[1 of 1 positions shown; findings below may reference images not displayed]

FINDINGS: Single view of the chest again demonstrates some hyperinflation of
the lungs. Heart size is within normal limits and stable.
Atherosclerotic calcifications at the aortic arch. Few densities in
the right lower chest could represent overlying structures. No
significant airspace disease or lung consolidation. Lucency
underneath the right hemidiaphragm is most compatible with bowel
gas.
IMPRESSION: No acute chest findings.

## 2021-08-04 IMAGING — DX DG ABD PORTABLE 1V
1 series · 1 of 1 positions shown · non-contrast
Comparison: 05/26/2021, 05/24/2021

CLINICAL DATA: Abdominal distension

EXAM:
PORTABLE ABDOMEN - 1 VIEW

[abdomen kub]
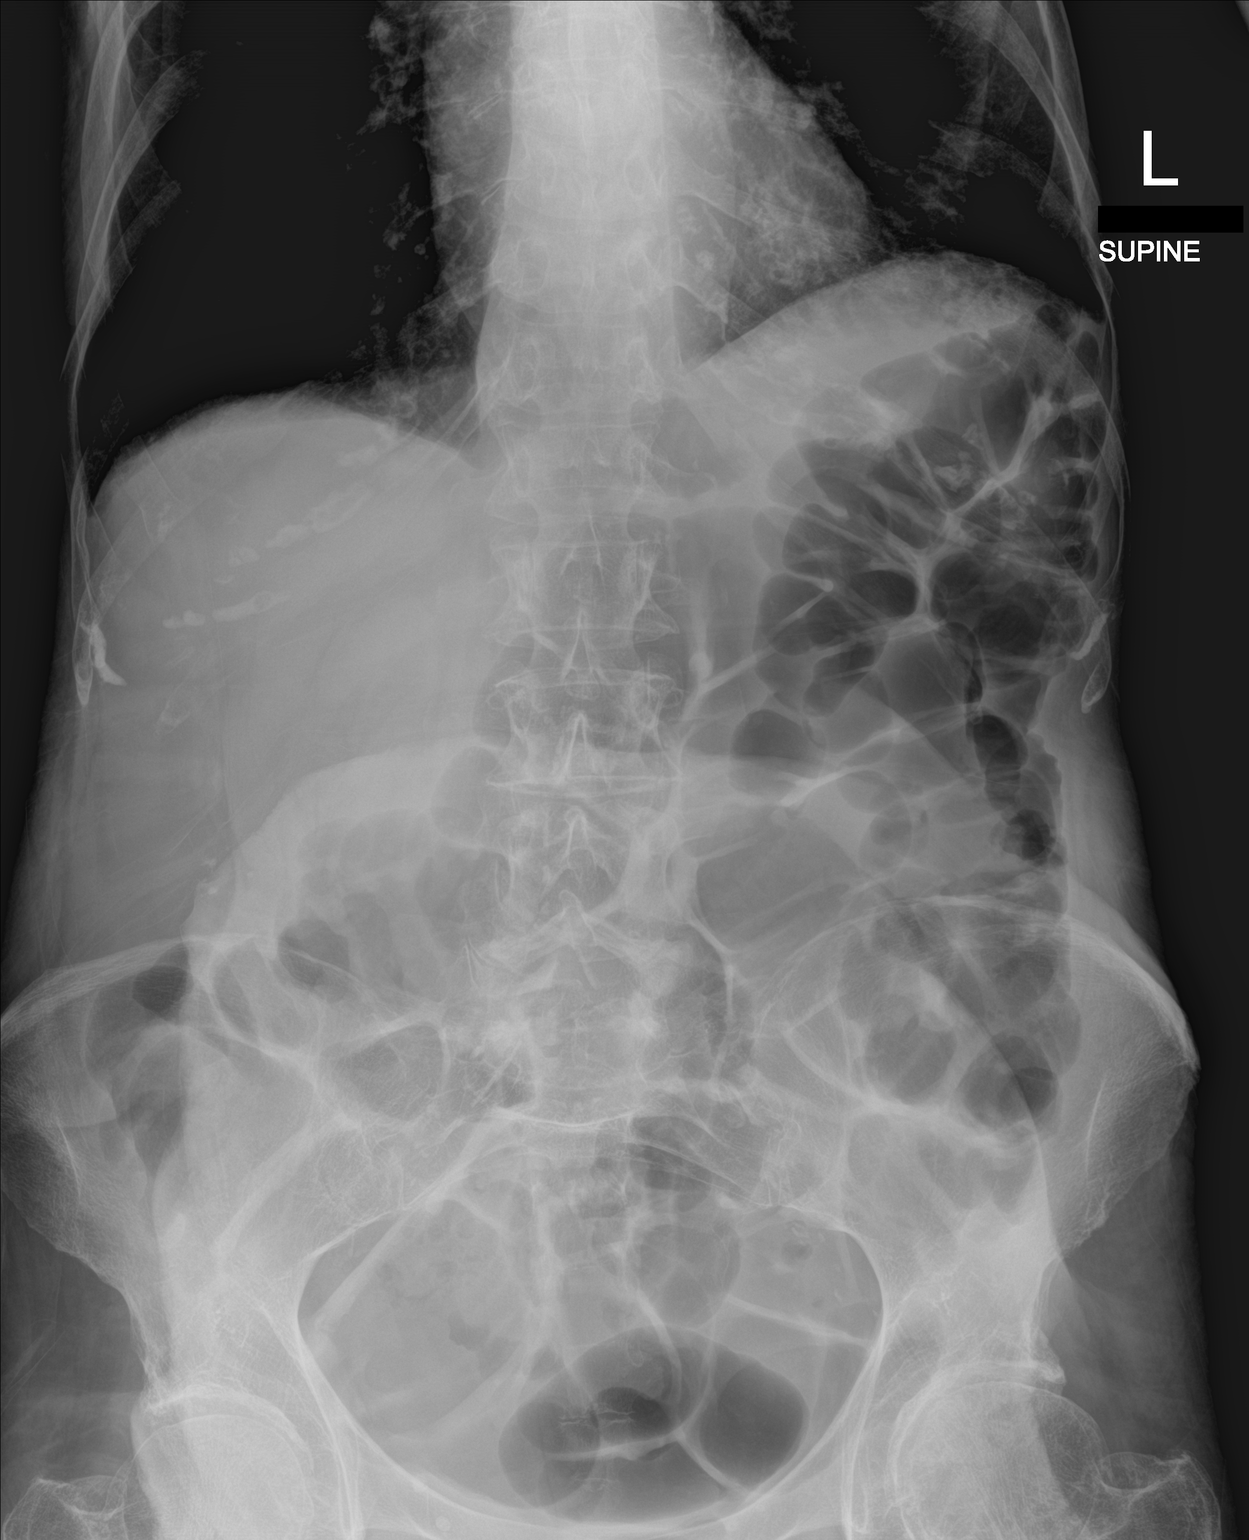

[1 of 1 positions shown; findings below may reference images not displayed]

FINDINGS: Persistently distended loops of large and small bowel throughout the
abdomen, similar in appearance to the previous studies. No gross
free intraperitoneal air on portable supine view.
IMPRESSION: Persistently distended large and small bowel throughout the abdomen,
similar in appearance to previous studies.
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
## 2021-10-05 ENCOUNTER — Other Ambulatory Visit: Payer: Self-pay

## 2021-10-05 ENCOUNTER — Emergency Department (HOSPITAL_COMMUNITY)
Admission: EM | Admit: 2021-10-05 | Discharge: 2021-10-05 | Disposition: A | Discharge status: Home / Self Care | Payer: Medicare Other | Attending: Emergency Medicine | Admitting: Emergency Medicine

## 2021-10-05 ENCOUNTER — Encounter (HOSPITAL_COMMUNITY): Payer: Self-pay

## 2021-10-05 ENCOUNTER — Emergency Department (HOSPITAL_COMMUNITY): Payer: Medicare Other

## 2021-10-05 DIAGNOSIS — Z8616 Personal history of COVID-19: Secondary | ICD-10-CM | POA: Diagnosis not present

## 2021-10-05 DIAGNOSIS — R059 Cough, unspecified: Secondary | ICD-10-CM | POA: Diagnosis not present

## 2021-10-05 DIAGNOSIS — F17210 Nicotine dependence, cigarettes, uncomplicated: Secondary | ICD-10-CM | POA: Insufficient documentation

## 2021-10-05 DIAGNOSIS — Z7952 Long term (current) use of systemic steroids: Secondary | ICD-10-CM | POA: Diagnosis not present

## 2021-10-05 DIAGNOSIS — J441 Chronic obstructive pulmonary disease with (acute) exacerbation: Secondary | ICD-10-CM | POA: Diagnosis not present

## 2021-10-05 DIAGNOSIS — R0602 Shortness of breath: Secondary | ICD-10-CM | POA: Diagnosis not present

## 2021-10-05 DIAGNOSIS — I10 Essential (primary) hypertension: Secondary | ICD-10-CM | POA: Diagnosis not present

## 2021-10-05 DIAGNOSIS — Z79899 Other long term (current) drug therapy: Secondary | ICD-10-CM | POA: Diagnosis not present

## 2021-10-05 DIAGNOSIS — J449 Chronic obstructive pulmonary disease, unspecified: Secondary | ICD-10-CM | POA: Diagnosis not present

## 2021-10-05 DIAGNOSIS — Z20822 Contact with and (suspected) exposure to covid-19: Secondary | ICD-10-CM | POA: Diagnosis not present

## 2021-10-05 DIAGNOSIS — J439 Emphysema, unspecified: Secondary | ICD-10-CM | POA: Diagnosis not present

## 2021-10-05 LAB — RESP PANEL BY RT-PCR (FLU A&B, COVID) ARPGX2
Influenza A by PCR: NEGATIVE
Influenza B by PCR: NEGATIVE
SARS Coronavirus 2 by RT PCR: NEGATIVE

## 2021-10-05 MED ORDER — ALBUTEROL SULFATE HFA 108 (90 BASE) MCG/ACT IN AERS
2.0000 | INHALATION_SPRAY | Freq: Once | RESPIRATORY_TRACT | Status: AC
  Administered 2021-10-05: 2 via RESPIRATORY_TRACT
  Filled 2021-10-05: qty 6.7

## 2021-10-05 MED ORDER — IPRATROPIUM-ALBUTEROL 20-100 MCG/ACT IN AERS
1.0000 | INHALATION_SPRAY | Freq: Four times a day (QID) | RESPIRATORY_TRACT | Status: DC
  Administered 2021-10-05: 1 via RESPIRATORY_TRACT
  Filled 2021-10-05: qty 4

## 2021-10-05 MED ORDER — PREDNISONE 50 MG PO TABS: 50.0000 mg | ORAL_TABLET | Freq: Every day | ORAL | 0 refills | Status: DC

## 2021-10-05 MED ORDER — IPRATROPIUM-ALBUTEROL 0.5-2.5 (3) MG/3ML IN SOLN
3.0000 mL | Freq: Once | RESPIRATORY_TRACT | Status: AC
  Administered 2021-10-05: 3 mL via RESPIRATORY_TRACT
  Filled 2021-10-05: qty 3

## 2021-10-05 NOTE — Discharge Instructions (Signed)
The x-ray did not show pneumonia.  Your COVID and flu test were negative.  Use the inhaler you were given in the emergency room today to help with cough and wheezing.  Finish the course of steroids to help with the congestion and the wheezing.  You can also take over-the-counter cold medications as needed . follow-up with your primary care as we discussed to make sure you are on the appropriate maintenance medications for your COPD

## 2021-10-05 NOTE — ED Triage Notes (Signed)
Pt reports with shortness of breath and cough/congestion for several days.
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
## 2021-10-05 NOTE — ED Provider Notes (Signed)
[facility] DEPT Provider Note   CSN: [id number] Arrival date & time: 10/05/21  0345     History Chief Complaint  Patient presents with   Shortness of Breath    Brenda Gibson is a 80 y.o. female.   Shortness of Breath  Patient presents to the ER for evaluation of URI cold symptoms.  Patient states symptoms started several days ago.  She started having cough and congestion.  Patient does have history of COPD and thinks she was wheezing but her inhalers seem to have run out.  Patient does have a primary care doctor but admits to not seeing that person regularly.  Last evening when she was coughing more her family thought she needed to get checked out.  Patient overall feels like she just has the start of a bad cold.  She has not had any fevers.  She is not having any chest pain.  No complaints of leg swelling.  Past Medical History:  Diagnosis Date   Basal cell carcinoma of ala nasi    Cataract    Hypertension    Knee pain, bilateral    Measles    Myalgia    Seborrheic dermatitis of scalp    Varicella     Patient Active Problem List   Diagnosis Date Noted   Partial small bowel obstruction ([place]) 05/24/2021   Acute hypoxemic respiratory failure due to COVID-19 ([place]) 05/22/2021   COPD exacerbation ([place]) 05/22/2021   COVID-19 virus infection 05/22/2021   Hyperglycemia 05/22/2021   Closed fracture of left tibial plateau 08/14/2020   Physical deconditioning 07/30/2016   Small bowel obstruction (HCC)    Hypokalemia 07/04/2016   Hypomagnesemia 07/04/2016   Urinary retention 07/04/2016   Ileus, postoperative ([place]) 07/02/2016   Malnutrition of moderate degree 06/28/2016   SBO (small bowel obstruction) s/p lysis of adhesion 06/25/2016 06/21/2016   Enteritis 06/21/2016   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   Elevated blood pressure reading without diagnosis of hypertension 06/19/2016   Medicare annual wellness visit, subsequent 02/23/2016   Female  bladder prolapse 09/03/2014   Routine general medical examination at a health care facility 08/12/2014   Osteoporosis 08/12/2014   Overactive bladder 08/02/2013   Essential hypertension, benign 06/05/2013   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 '09 (Dr. Dolores Lory)   LAPAROTOMY N/A 06/25/2016   Procedure: EXPLORATORY LAPAROTOMY, lysis of adhesion;  Surgeon: Coralie Keens, MD;  Location: [facility];  Service: General;  Laterality: N/A;   LYSIS OF ADHESION  06/25/2016   Dr Nedra Hai     OB History   No obstetric history on file.     Family History  Problem Relation Age of Onset   Alcohol abuse Mother    Cancer Father 49       stomach cancer   Prostate cancer Father    Hypertension Maternal Grandfather    Diabetes Neg Hx    Heart disease Neg Hx     Social History   Tobacco Use   Smoking status: Every Day    Packs/day: 0.60    Years: 52.00    Pack years: 31.20    Types: Cigarettes   Smokeless tobacco: Never   Tobacco comments:    Pt smokes 5 cigarettes  per day  Vaping Use   Vaping Use: Never used  Substance Use Topics   Alcohol use: Yes    Comment: rare use   Drug use: No    Home Medications Prior to Admission medications   Medication Sig Start Date End Date Taking? Authorizing Provider  predniSONE (DELTASONE) 50 MG tablet Take 1 tablet (50 mg total) by mouth daily. 10/05/21  Yes Dorie Rank, MD  albuterol (VENTOLIN HFA) 108 (90 Base) MCG/ACT inhaler Inhale 2 puffs into the lungs every 4 (four) hours as needed for wheezing or shortness of breath. 06/01/21   Elodia Florence., MD  amLODipine (NORVASC) 10 MG tablet TAKE 1 TABLET(10 MG) BY MOUTH DAILY 07/25/21   Plotnikov, Evie Lacks, MD  tiotropium (SPIRIVA HANDIHALER) 18 MCG inhalation capsule  Place 1 capsule (18 mcg total) into inhaler and inhale daily. 06/01/21   Elodia Florence., MD    Allergies    Patient has no known allergies.  Review of Systems   Review of Systems  Respiratory:  Positive for shortness of breath.   All other systems reviewed and are negative.  Physical Exam Updated Vital Signs BP 128/72 (BP Location: Left Arm)   Pulse 78   Temp (!) 97.5 F (36.4 C) (Oral)   Resp (!) 22   SpO2 95%   Physical Exam Vitals and nursing note reviewed.  Constitutional:      General: She is not in acute distress.    Appearance: She is well-developed.  HENT:     Head: Normocephalic and atraumatic.     Right Ear: External ear normal.     Left Ear: External ear normal.     Nose: Congestion and rhinorrhea present.  Eyes:     General: No scleral icterus.       Right eye: No discharge.        Left eye: No discharge.     Conjunctiva/sclera: Conjunctivae normal.  Neck:     Trachea: No tracheal deviation.  Cardiovascular:     Rate and Rhythm: Normal rate and regular rhythm.  Pulmonary:     Effort: Pulmonary effort is normal. No respiratory distress.     Breath sounds: Normal breath sounds. No stridor. No wheezing or rales.  Abdominal:     General: Bowel sounds are normal. There is no distension.     Palpations: Abdomen is soft.     Tenderness: There is no abdominal tenderness. There is no guarding or rebound.  Musculoskeletal:        General: No tenderness or deformity.     Cervical back: Neck supple.  Skin:    General: Skin is warm and dry.     Findings: No rash.  Neurological:     General: No focal deficit present.     Mental Status: She is alert.     Cranial Nerves: No cranial nerve deficit (no facial droop, extraocular movements intact, no slurred speech).     Sensory: No sensory deficit.     Motor: No abnormal muscle tone or seizure activity.     Coordination: Coordination normal.  Psychiatric:        Mood and Affect: Mood normal.    ED Results  / Procedures / Treatments   Labs (all labs ordered are listed, but only abnormal results are displayed) Labs Reviewed  RESP PANEL BY RT-PCR (FLU A&B, COVID) [id number]    EKG None  Radiology DG Chest 2 View  Result Date: 10/05/2021 CLINICAL DATA:  Shortness of breath,  coughing and congestion. EXAM: CHEST - 2 VIEW COMPARISON:  Portable chest 07/09/2021. FINDINGS: Comparison was also made with studies of 05/29/2021 and 05/26/2021. The lungs are emphysematous but clear. No pleural effusion is seen. Heart size and vasculature are normal. Stable COPD chest. Osteopenia and thoracic kyphosis. IMPRESSION: Emphysematous disease with no evidence of acute pneumonia. Electronically Signed   By: Telford Nab M.D.   On: 10/05/2021 05:38    Procedures Procedures   Medications Ordered in ED Medications  Ipratropium-Albuterol (COMBIVENT) respimat 1 puff (has no administration in time range)  ipratropium-albuterol (DUONEB) 0.5-2.5 (3) MG/3ML nebulizer solution 3 mL (3 mLs Nebulization Given 10/05/21 0456)  albuterol (VENTOLIN HFA) 108 (90 Base) MCG/ACT inhaler 2 puff (2 puffs Inhalation Given 10/05/21 0456)    ED Course  I have reviewed the triage vital signs and the nursing notes.  Pertinent labs & imaging results that were available during my care of the patient were reviewed by me and considered in my medical decision making (see chart for details).  Clinical Course as of 10/05/21 0744  Mon Oct 05, 2021  0719 Chest x-ray without signs of pneumonia [JK]  0719 Covid flu test negative [JK]    Clinical Course User Index [JK] Dorie Rank, MD   MDM Rules/Calculators/A&P                           Patient has history of COPD.  She has taken inhalers in the past but does not seem to have a set regimen.  She states she was given a prescription the last time she was in the hospital but is waiting for some type of approval before she can get that.  I reviewed the records and it was supposed to be a Spiriva  inhaler.  On my exam the patient is not having any wheezing.  She was given a breathing treatment prior to my evaluation.  Chest x-ray does not show pneumonia and her COVID and influenza test are negative.  Symptoms consistent with a URI and mild COPD exacerbation.  I will discharge her home on a course of prednisone.  I will give the patient a Combivent inhaler to take home with her maintenance inhalers and rescue inhalers Final Clinical Impression(s) / ED Diagnoses Final diagnoses:  COPD exacerbation ([place])    Rx / DC Orders ED Discharge Orders          Ordered    predniSONE (DELTASONE) 50 MG tablet  Daily        10/05/21 0744             Dorie Rank, MD 10/05/21 [phone]

## 2021-10-14 ENCOUNTER — Other Ambulatory Visit: Payer: Self-pay | Admitting: Internal Medicine

## 2021-10-14 DIAGNOSIS — I10 Essential (primary) hypertension: Secondary | ICD-10-CM

## 2021-10-16 ENCOUNTER — Telehealth: Payer: Self-pay

## 2021-10-16 NOTE — Telephone Encounter (Signed)
Pt has not been seen since 2017 in our office. Patient request medication for bp issues. Patient notified that she has to set up a new pt apt and she was not willing to do so.

## 2021-11-12 ENCOUNTER — Encounter (HOSPITAL_COMMUNITY): Payer: Self-pay | Admitting: Emergency Medicine

## 2021-11-12 ENCOUNTER — Other Ambulatory Visit: Payer: Self-pay

## 2021-11-12 ENCOUNTER — Emergency Department (HOSPITAL_COMMUNITY)
Admission: EM | Admit: 2021-11-12 | Discharge: 2021-11-12 | Disposition: A | Discharge status: Home / Self Care | Payer: Medicare Other | Attending: Emergency Medicine | Admitting: Emergency Medicine

## 2021-11-12 DIAGNOSIS — Z760 Encounter for issue of repeat prescription: Secondary | ICD-10-CM | POA: Diagnosis not present

## 2021-11-12 DIAGNOSIS — I10 Essential (primary) hypertension: Secondary | ICD-10-CM | POA: Diagnosis not present

## 2021-11-12 DIAGNOSIS — J449 Chronic obstructive pulmonary disease, unspecified: Secondary | ICD-10-CM | POA: Insufficient documentation

## 2021-11-12 DIAGNOSIS — Z79899 Other long term (current) drug therapy: Secondary | ICD-10-CM | POA: Diagnosis not present

## 2021-11-12 DIAGNOSIS — Z7951 Long term (current) use of inhaled steroids: Secondary | ICD-10-CM | POA: Insufficient documentation

## 2021-11-12 MED ORDER — AMLODIPINE BESYLATE 10 MG PO TABS: ORAL_TABLET | ORAL | 1 refills | Status: DC

## 2021-11-12 NOTE — Discharge Instructions (Addendum)
You were seen in the emergency department today for medication refill.  I have sent the refill that you already have over to the pharmacy that you have asked for.  Please follow-up with primary care and establish herself as a new patient so you can continue to have your medications managed.  Please continue to take your blood pressure at home with your home blood pressure cuff to monitor.
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
## 2021-11-12 NOTE — ED Provider Notes (Signed)
[facility] DEPT Provider Note   CSN: [id number] Arrival date & time: 11/12/21  1129     History  Chief Complaint  Patient presents with   Medication Refill    Brenda Gibson is a 81 y.o. female.  With past medical history of hypertension, osteoporosis, COPD who presents to the emergency department requesting medication refill.  Patient states that she has been out of her amlodipine for at least a week.  She states that her previous provider has retired from [employer] and she has not been able to find another provider yet.  She states that she does have insurance.  She usually has no difficulty refilling her prescriptions.  She states that she has been taking amlodipine for years without complication.  She has never had an adverse reaction to her amlodipine. She has a blood pressure cuff at home to monitor her blood pressure with.   Today she states that she needs medication refill.  She does not describe any chest pain, shortness of breath, palpitations, headaches, vision changes, hypotensive episodes, pedal edema.    Medication Refill     Home Medications Prior to Admission medications   Medication Sig Start Date End Date Taking? Authorizing Provider  albuterol (VENTOLIN HFA) 108 (90 Base) MCG/ACT inhaler Inhale 2 puffs into the lungs every 4 (four) hours as needed for wheezing or shortness of breath. 06/01/21   Elodia Florence., MD  amLODipine (NORVASC) 10 MG tablet TAKE 1 TABLET(10 MG) BY MOUTH DAILY 07/25/21   Plotnikov, Evie Lacks, MD  predniSONE (DELTASONE) 50 MG tablet Take 1 tablet (50 mg total) by mouth daily. 10/05/21   Dorie Rank, MD  tiotropium (SPIRIVA HANDIHALER) 18 MCG inhalation capsule Place 1 capsule (18 mcg total) into inhaler and inhale daily. 06/01/21   Elodia Florence., MD      Allergies    Patient has no known allergies.    Review of Systems   Review of Systems  All other systems reviewed and are negative.  Physical  Exam Updated Vital Signs BP (!) 159/87 (BP Location: Right Arm)    Pulse 73    Temp 98.6 F (37 C) (Oral)    Resp 16    SpO2 97%  Physical Exam Vitals and nursing note reviewed.  Constitutional:      Appearance: Normal appearance.  HENT:     Head: Normocephalic and atraumatic.  Eyes:     General: No scleral icterus. Pulmonary:     Effort: Pulmonary effort is normal. No respiratory distress.  Skin:    Findings: No rash.  Neurological:     General: No focal deficit present.     Mental Status: She is alert.  Psychiatric:        Mood and Affect: Mood normal.        Behavior: Behavior normal.        Thought Content: Thought content normal.        Judgment: Judgment normal.    ED Results / Procedures / Treatments   Labs (all labs ordered are listed, but only abnormal results are displayed) Labs Reviewed - No data to display  EKG None  Radiology No results found.  Procedures Procedures    Medications Ordered in ED Medications - No data to display  ED Course/ Medical Decision Making/ A&P                           Medical Decision Making  This patient presents to the ED for concern of medication refill, this involves an extensive number of treatment options, and is a complaint that carries with it a low risk of complications and morbidity.  The differential diagnosis includes medication refill    Co morbidities that complicate the patient evaluation  Hypertension  Osteoporosis  COPD   Additional history obtained:  External records from outside source obtained and reviewed including previous primary care visits    Medicines ordered and prescription drug management:  I have reviewed the patients home medicines and have made adjustments as needed   Test Considered:  Could consider basic labs however HPI and PE do not indicate need for labs or imaging.   Problem List / ED Course:  Medication Refill  81 year old female who presents emergency department for  medication refill.  As noted above she does not have primary care at this time and is seeking medication refill on her home amlodipine.  It appears that she has been on same dose of amlodipine 10 mg daily for a number of years.  She has never had a documented adverse reaction to this medication.  She has no complaints.  She is not having chest pain, shortness of breath, headache or visual changes.  She has never had pedal edema in relation to the medication.  She is never had an anaphylactic reaction to amlodipine.  She has ability to take her blood pressure at home.  Reasonable to refill prescription at this time.  We will attempt to get her in touch with [facility] for primary care   Social Determinants of Health:  Has insurance but no PCP. Has not been able to establish primary care since previous provider disconnect.    Dispostion:  After consideration of the diagnostic results and the patients response to treatment, I feel that the patent would benefit from discharge with primary care follow-up. Patient is without primary care at this time. I have provided her with 57-month medication refill for her Amlodipine as it has been prescribed. I have also provided her with [facility] number to establish care there until she is able to establish care at different primary care facility. She verbalizes understanding to call them today and make appointment. Given return precautions for adverse events; however have low suspicion of this as patient has been on same medication and dosing for years.   Final Clinical Impression(s) / ED Diagnoses Final diagnoses:  Encounter for medication refill    Rx / DC Orders ED Discharge Orders          Ordered    amLODipine (NORVASC) 10 MG tablet       Note to Pharmacy: Schedule office visit please   11/12/21 1426              Mickie Hillier, PA-C 11/12/21 1445    Valarie Merino, MD 11/12/21 [phone]

## 2021-12-03 ENCOUNTER — Emergency Department (HOSPITAL_COMMUNITY): Payer: Medicare Other

## 2021-12-03 ENCOUNTER — Other Ambulatory Visit: Payer: Self-pay

## 2021-12-03 ENCOUNTER — Encounter (HOSPITAL_COMMUNITY): Payer: Self-pay | Admitting: Oncology

## 2021-12-03 ENCOUNTER — Emergency Department (HOSPITAL_COMMUNITY)
Admission: EM | Admit: 2021-12-03 | Discharge: 2021-12-03 | Disposition: A | Discharge status: Home / Self Care | Payer: Medicare Other | Attending: Emergency Medicine | Admitting: Emergency Medicine

## 2021-12-03 DIAGNOSIS — U071 COVID-19: Secondary | ICD-10-CM | POA: Diagnosis not present

## 2021-12-03 DIAGNOSIS — J441 Chronic obstructive pulmonary disease with (acute) exacerbation: Secondary | ICD-10-CM | POA: Insufficient documentation

## 2021-12-03 DIAGNOSIS — I10 Essential (primary) hypertension: Secondary | ICD-10-CM | POA: Diagnosis not present

## 2021-12-03 DIAGNOSIS — F17210 Nicotine dependence, cigarettes, uncomplicated: Secondary | ICD-10-CM | POA: Insufficient documentation

## 2021-12-03 DIAGNOSIS — Z7951 Long term (current) use of inhaled steroids: Secondary | ICD-10-CM | POA: Diagnosis not present

## 2021-12-03 DIAGNOSIS — R0600 Dyspnea, unspecified: Secondary | ICD-10-CM | POA: Diagnosis not present

## 2021-12-03 LAB — CBC WITH DIFFERENTIAL/PLATELET
Abs Immature Granulocytes: 0.02 10*3/uL (ref 0.00–0.07)
Basophils Absolute: 0 10*3/uL (ref 0.0–0.1)
Basophils Relative: 1 %
Eosinophils Absolute: 0 10*3/uL (ref 0.0–0.5)
Eosinophils Relative: 0 %
HCT: 41.7 % (ref 36.0–46.0)
Hemoglobin: 13.2 g/dL (ref 12.0–15.0)
Immature Granulocytes: 0 %
Lymphocytes Relative: 14 %
Lymphs Abs: 1 10*3/uL (ref 0.7–4.0)
MCH: 27.4 pg (ref 26.0–34.0)
MCHC: 31.7 g/dL (ref 30.0–36.0)
MCV: 86.5 fL (ref 80.0–100.0)
Monocytes Absolute: 0.7 10*3/uL (ref 0.1–1.0)
Monocytes Relative: 10 %
Neutro Abs: 5.4 10*3/uL (ref 1.7–7.7)
Neutrophils Relative %: 75 %
Platelets: 260 10*3/uL (ref 150–400)
RBC: 4.82 MIL/uL (ref 3.87–5.11)
RDW: 17 % — ABNORMAL HIGH (ref 11.5–15.5)
WBC: 7.1 10*3/uL (ref 4.0–10.5)
nRBC: 0 % (ref 0.0–0.2)

## 2021-12-03 LAB — BASIC METABOLIC PANEL
Anion gap: 6 (ref 5–15)
BUN: 11 mg/dL (ref 8–23)
CO2: 28 mmol/L (ref 22–32)
Calcium: 8.8 mg/dL — ABNORMAL LOW (ref 8.9–10.3)
Chloride: 100 mmol/L (ref 98–111)
Creatinine, Ser: 0.63 mg/dL (ref 0.44–1.00)
GFR, Estimated: >60 mL/min (ref >60)
Glucose, Bld: 121 mg/dL — ABNORMAL HIGH (ref 70–99)
Potassium: 3.7 mmol/L (ref 3.5–5.1)
Sodium: 134 mmol/L — ABNORMAL LOW (ref 135–145)

## 2021-12-03 LAB — RESP PANEL BY RT-PCR (FLU A&B, COVID) ARPGX2
Influenza A by PCR: NEGATIVE
Influenza B by PCR: NEGATIVE
SARS Coronavirus 2 by RT PCR: POSITIVE — AB

## 2021-12-03 MED ORDER — PREDNISONE 50 MG PO TABS: 50.0000 mg | ORAL_TABLET | Freq: Every day | ORAL | 0 refills | Status: AC

## 2021-12-03 MED ORDER — IPRATROPIUM-ALBUTEROL 0.5-2.5 (3) MG/3ML IN SOLN
3.0000 mL | Freq: Once | RESPIRATORY_TRACT | Status: AC
  Administered 2021-12-03: 3 mL via RESPIRATORY_TRACT
  Filled 2021-12-03: qty 3

## 2021-12-03 MED ORDER — METHYLPREDNISOLONE SODIUM SUCC 125 MG IJ SOLR
125.0000 mg | Freq: Once | INTRAMUSCULAR | Status: AC
  Administered 2021-12-03: 125 mg via INTRAVENOUS
  Filled 2021-12-03: qty 2

## 2021-12-03 NOTE — Discharge Instructions (Addendum)
As discussed, your evaluation today has been largely reassuring.  But, it is important that you monitor your condition carefully, and do not hesitate to return to the ED if you develop new, or concerning changes in your condition.  For the next 2 days please use your albuterol every 4 hours.  You may then use it as needed.  Otherwise, please follow-up with your physician for appropriate ongoing care.

## 2021-12-03 NOTE — ED Provider Triage Note (Signed)
Emergency Medicine Provider Triage Evaluation Note  Brenda Gibson , a 81 y.o. female  was evaluated in triage.  Pt complains of shortness of breath onset this morning.  She denies chest pain, abdominal pain, fever, chills.  Reports this feels typical of her COPD exacerbation.  Review of Systems  Positive: Shortness of breath Negative: Chest pain, fever, chills  Physical Exam  BP (!) 167/70 (BP Location: Right Arm)    Pulse 74    Temp 98 F (36.7 C) (Oral)    Resp 18    SpO2 97%  Gen:   Awake, no distress   Resp:  Normal effort.  Diminished lung sounds. MSK:   Moves extremities without difficulty  Other:    Medical Decision Making  Medically screening exam initiated at 9:34 AM.  Appropriate orders placed.  MEHREEN AZIZI was informed that the remainder of the evaluation will be completed by another provider, this initial triage assessment does not replace that evaluation, and the importance of remaining in the ED until their evaluation is complete.     Evlyn Courier, PA-C 12/03/21 [phone]

## 2021-12-03 NOTE — ED Triage Notes (Signed)
Pt bib [facility] from [facility] for c/o shob.  Staff reported to EMS pt entered establishment and began c/o shob w/ pursed lip breathing.  EMS reports spo2 94% on RA w/ clear lung sounds b/l.  12 lead unremarkable.

## 2021-12-03 NOTE — ED Provider Notes (Signed)
[facility] DEPT Provider Note   CSN: [id number] Arrival date & time: 12/03/21  0907     History  Chief Complaint  Patient presents with   Shortness of Breath    Brenda Gibson is a 81 y.o. female.  HPI Patient with history of cigarette addiction, COPD, presents with dyspnea.  She notes intermittent episodes recently, but today she woke with more dyspnea than usual.  No fever, no chest pain, no vomiting, no nausea.  No relief with anything.    Home Medications Prior to Admission medications   Medication Sig Start Date End Date Taking? Authorizing Provider  albuterol (VENTOLIN HFA) 108 (90 Base) MCG/ACT inhaler Inhale 2 puffs into the lungs every 4 (four) hours as needed for wheezing or shortness of breath. 06/01/21   Elodia Florence., MD  amLODipine (NORVASC) 10 MG tablet Please take 1 tablet (10mg ) by mouth daily 11/12/21   Mickie Hillier, PA-C  predniSONE (DELTASONE) 50 MG tablet Take 1 tablet (50 mg total) by mouth daily. 10/05/21   Dorie Rank, MD  tiotropium (SPIRIVA HANDIHALER) 18 MCG inhalation capsule Place 1 capsule (18 mcg total) into inhaler and inhale daily. 06/01/21   Elodia Florence., MD      Allergies    Patient has no known allergies.    Review of Systems   Review of Systems  All other systems reviewed and are negative.  Physical Exam Updated Vital Signs BP (!) 167/70 (BP Location: Right Arm)    Pulse 74    Temp 98 F (36.7 C) (Oral)    Resp 18    SpO2 97%  Physical Exam Vitals and nursing note reviewed.  Constitutional:      General: She is not in acute distress.    Appearance: She is ill-appearing. She is not toxic-appearing.  HENT:     Head: Normocephalic and atraumatic.  Eyes:     Conjunctiva/sclera: Conjunctivae normal.  Cardiovascular:     Rate and Rhythm: Normal rate and regular rhythm.  Pulmonary:     Effort: Tachypnea present.     Breath sounds: Decreased breath sounds present.  Abdominal:      General: There is no distension.  Skin:    General: Skin is warm and dry.  Neurological:     Mental Status: She is alert and oriented to person, place, and time.     Cranial Nerves: No cranial nerve deficit.    ED Results / Procedures / Treatments   Labs (all labs ordered are listed, but only abnormal results are displayed) Labs Reviewed  CBC WITH DIFFERENTIAL/PLATELET - Abnormal; Notable for the following components:      Result Value   RDW 17.0 (*)    All other components within normal limits  BASIC METABOLIC PANEL - Abnormal; Notable for the following components:   Sodium 134 (*)    Glucose, Bld 121 (*)    Calcium 8.8 (*)    All other components within normal limits  RESP PANEL BY RT-PCR (FLU A&B, COVID) [id number]    EKG EKG Interpretation  Date/Time:  Thursday December 03 2021 09:22:41 EST Ventricular Rate:  71 PR Interval:  148 QRS Duration: 85 QT Interval:  406 QTC Calculation: 442 R Axis:   107 Text Interpretation: Sinus rhythm Right atrial enlargement Right ventricular hypertrophy Borderline T abnormalities, lateral leads Abnormal ECG Confirmed by Carmin Muskrat [phone]) on 12/03/2021 11:10:42 AM  Radiology DG Chest 2 View  Result Date: 12/03/2021 CLINICAL DATA:  Dyspnea  EXAM: CHEST - 2 VIEW COMPARISON:  10/05/2021 chest radiograph. FINDINGS: Stable cardiomediastinal silhouette with normal heart size. No pneumothorax. No pleural effusion. Hyperinflated lungs. No pulmonary edema. No acute consolidative airspace disease. IMPRESSION: Hyperinflated lungs, suggesting COPD. Otherwise no active cardiopulmonary disease. Electronically Signed   By: Ilona Sorrel M.D.   On: 12/03/2021 09:52    Procedures Procedures    Medications Ordered in ED Medications  ipratropium-albuterol (DUONEB) 0.5-2.5 (3) MG/3ML nebulizer solution 3 mL (3 mLs Nebulization Not Given 12/03/21 1024)  methylPREDNISolone sodium succinate (SOLU-MEDROL) 125 mg/2 mL injection 125 mg (has no administration in  time range)   Pulse ox 98% 2 L room air unremarkable Cardiac 75 sinus unremarkable ED Course/ Medical Decision Making/ A&P This patient presents to the ED for concern of dyspnea, fatigue, this involves an extensive number of treatment options, and is a complaint that carries with it a high risk of complications and morbidity.  The differential diagnosis includes COPD exacerbation, pneumonia, bacteremia, sepsis, ACS   Co morbidities that complicate the patient evaluation  Continued smoking addiction, COPD   Social Determinants of Health:  Cigarette addiction, age   Additional history obtained:  Additional history and/or information obtained from chart review External records from outside source obtained and reviewed including recent evaluation for COPD exacerbation, ongoing outpatient management for same   Lab Tests:  I Ordered (or co-signed), and personally interpreted labs.  The pertinent results include: Chemistry, CBC, both reassuring   Imaging Studies ordered:  I ordered (or co-signed) imaging studies including chest x-ray I independently visualized and interpreted imaging which showed no notable pneumonia, findings consistent with COPD I agree with the radiologist interpretation   Cardiac Monitoring:  Pulse ox 98% 2 L room air unremarkable Cardiac 75 sinus unremarkable  Medicines ordered and prescription drug management:  I ordered medication including Steroids, bronchodilators Reevaluation of the patient after these medicines showed that the patient improved    Reevaluation:  After the interventions noted above, I reevaluated the patient and found that they have :improved.  Patient with increased work of breathing, no oxygen requirement after repeat evaluation no pain   Dispostion:  After consideration of the diagnostic results and the patients response to treatment, I feel that the patent would benefit from ongoing steroid therapy, bronchodilators  scheduled course.  Hospitalization considered, but without new oxygen requirement, with the patient endorsement of feeling better she is appropriate for discharge with close outpatient follow-up, meds as above..    Final Clinical Impression(s) / ED Diagnoses Final diagnoses:  COPD exacerbation ([place])     Carmin Muskrat, MD 12/03/21 [phone]

## 2021-12-12 ENCOUNTER — Other Ambulatory Visit: Payer: Self-pay

## 2021-12-12 ENCOUNTER — Inpatient Hospital Stay (HOSPITAL_COMMUNITY)
Admission: EM | Admit: 2021-12-12 | Discharge: 2021-12-13 | DRG: 190 | Disposition: A | Discharge status: Home / Self Care | Payer: Medicare Other | Attending: Internal Medicine | Admitting: Internal Medicine

## 2021-12-12 ENCOUNTER — Encounter (HOSPITAL_COMMUNITY): Payer: Self-pay

## 2021-12-12 ENCOUNTER — Emergency Department (HOSPITAL_COMMUNITY): Payer: Medicare Other

## 2021-12-12 DIAGNOSIS — Z8249 Family history of ischemic heart disease and other diseases of the circulatory system: Secondary | ICD-10-CM | POA: Diagnosis not present

## 2021-12-12 DIAGNOSIS — M810 Age-related osteoporosis without current pathological fracture: Secondary | ICD-10-CM | POA: Diagnosis not present

## 2021-12-12 DIAGNOSIS — R636 Underweight: Secondary | ICD-10-CM | POA: Diagnosis present

## 2021-12-12 DIAGNOSIS — R739 Hyperglycemia, unspecified: Secondary | ICD-10-CM | POA: Diagnosis not present

## 2021-12-12 DIAGNOSIS — R0602 Shortness of breath: Secondary | ICD-10-CM | POA: Diagnosis not present

## 2021-12-12 DIAGNOSIS — Z20822 Contact with and (suspected) exposure to covid-19: Secondary | ICD-10-CM | POA: Diagnosis present

## 2021-12-12 DIAGNOSIS — F17210 Nicotine dependence, cigarettes, uncomplicated: Secondary | ICD-10-CM | POA: Diagnosis not present

## 2021-12-12 DIAGNOSIS — Z85828 Personal history of other malignant neoplasm of skin: Secondary | ICD-10-CM

## 2021-12-12 DIAGNOSIS — J441 Chronic obstructive pulmonary disease with (acute) exacerbation: Secondary | ICD-10-CM | POA: Diagnosis not present

## 2021-12-12 DIAGNOSIS — Z720 Tobacco use: Secondary | ICD-10-CM | POA: Diagnosis present

## 2021-12-12 DIAGNOSIS — R911 Solitary pulmonary nodule: Secondary | ICD-10-CM | POA: Diagnosis present

## 2021-12-12 DIAGNOSIS — J449 Chronic obstructive pulmonary disease, unspecified: Secondary | ICD-10-CM | POA: Diagnosis present

## 2021-12-12 DIAGNOSIS — Z8616 Personal history of COVID-19: Secondary | ICD-10-CM | POA: Diagnosis not present

## 2021-12-12 DIAGNOSIS — J9601 Acute respiratory failure with hypoxia: Secondary | ICD-10-CM | POA: Diagnosis present

## 2021-12-12 DIAGNOSIS — I10 Essential (primary) hypertension: Secondary | ICD-10-CM | POA: Diagnosis not present

## 2021-12-12 DIAGNOSIS — Z681 Body mass index (BMI) 19 or less, adult: Secondary | ICD-10-CM | POA: Diagnosis not present

## 2021-12-12 DIAGNOSIS — Z79899 Other long term (current) drug therapy: Secondary | ICD-10-CM | POA: Diagnosis not present

## 2021-12-12 HISTORY — DX: Unspecified intestinal obstruction, unspecified as to partial versus complete obstruction: K56.609

## 2021-12-12 LAB — BASIC METABOLIC PANEL
Anion gap: 6 (ref 5–15)
BUN: 13 mg/dL (ref 8–23)
CO2: 31 mmol/L (ref 22–32)
Calcium: 8.9 mg/dL (ref 8.9–10.3)
Chloride: 103 mmol/L (ref 98–111)
Creatinine, Ser: 0.7 mg/dL (ref 0.44–1.00)
GFR, Estimated: >60 mL/min (ref >60)
Glucose, Bld: 117 mg/dL — ABNORMAL HIGH (ref 70–99)
Potassium: 3.6 mmol/L (ref 3.5–5.1)
Sodium: 140 mmol/L (ref 135–145)

## 2021-12-12 LAB — BLOOD GAS, VENOUS
Acid-Base Excess: 2 mmol/L (ref 0.0–2.0)
Bicarbonate: 27.3 mmol/L (ref 20.0–28.0)
O2 Saturation: 93.7 %
Patient temperature: 98.6
pCO2, Ven: 48.4 mmHg (ref 44.0–60.0)
pH, Ven: 7.37 (ref 7.250–7.430)
pO2, Ven: 70.4 mmHg — ABNORMAL HIGH (ref 32.0–45.0)

## 2021-12-12 LAB — RESP PANEL BY RT-PCR (FLU A&B, COVID) ARPGX2
Influenza A by PCR: NEGATIVE
Influenza B by PCR: NEGATIVE
SARS Coronavirus 2 by RT PCR: NEGATIVE

## 2021-12-12 LAB — CBC
HCT: 43.1 % (ref 36.0–46.0)
Hemoglobin: 13.3 g/dL (ref 12.0–15.0)
MCH: 26.7 pg (ref 26.0–34.0)
MCHC: 30.9 g/dL (ref 30.0–36.0)
MCV: 86.5 fL (ref 80.0–100.0)
Platelets: 306 10*3/uL (ref 150–400)
RBC: 4.98 MIL/uL (ref 3.87–5.11)
RDW: 16 % — ABNORMAL HIGH (ref 11.5–15.5)
WBC: 8.6 10*3/uL (ref 4.0–10.5)
nRBC: 0 % (ref 0.0–0.2)

## 2021-12-12 LAB — TROPONIN I (HIGH SENSITIVITY)
Troponin I (High Sensitivity): 10 ng/L (ref <18)
Troponin I (High Sensitivity): 7 ng/L (ref <18)

## 2021-12-12 LAB — PHOSPHORUS: Phosphorus: 3.2 mg/dL (ref 2.5–4.6)

## 2021-12-12 MED ORDER — SODIUM CHLORIDE 0.9 % IV SOLN
500.0000 mg | Freq: Once | INTRAVENOUS | Status: AC
  Administered 2021-12-12: 500 mg via INTRAVENOUS
  Filled 2021-12-12: qty 5

## 2021-12-12 MED ORDER — IPRATROPIUM-ALBUTEROL 0.5-2.5 (3) MG/3ML IN SOLN
3.0000 mL | Freq: Once | RESPIRATORY_TRACT | Status: AC
  Administered 2021-12-12: 3 mL via RESPIRATORY_TRACT
  Filled 2021-12-12: qty 3

## 2021-12-12 MED ORDER — IPRATROPIUM-ALBUTEROL 0.5-2.5 (3) MG/3ML IN SOLN: 3.0000 mL | Freq: Four times a day (QID) | RESPIRATORY_TRACT | Status: DC

## 2021-12-12 MED ORDER — POTASSIUM CHLORIDE CRYS ER 20 MEQ PO TBCR
40.0000 meq | EXTENDED_RELEASE_TABLET | Freq: Once | ORAL | Status: AC
  Administered 2021-12-12: 40 meq via ORAL
  Filled 2021-12-12: qty 2

## 2021-12-12 MED ORDER — IPRATROPIUM-ALBUTEROL 0.5-2.5 (3) MG/3ML IN SOLN
3.0000 mL | Freq: Four times a day (QID) | RESPIRATORY_TRACT | Status: DC
  Administered 2021-12-12: 3 mL via RESPIRATORY_TRACT
  Filled 2021-12-12: qty 3

## 2021-12-12 MED ORDER — PREDNISONE 20 MG PO TABS
40.0000 mg | ORAL_TABLET | Freq: Every day | ORAL | Status: DC
  Administered 2021-12-13: 40 mg via ORAL
  Filled 2021-12-12: qty 2

## 2021-12-12 MED ORDER — METHYLPREDNISOLONE SODIUM SUCC 40 MG IJ SOLR
40.0000 mg | Freq: Two times a day (BID) | INTRAMUSCULAR | Status: AC
  Administered 2021-12-12: 40 mg via INTRAVENOUS
  Filled 2021-12-12: qty 1

## 2021-12-12 MED ORDER — MAGNESIUM SULFATE 2 GM/50ML IV SOLN
2.0000 g | Freq: Once | INTRAVENOUS | Status: AC
  Administered 2021-12-12: 2 g via INTRAVENOUS
  Filled 2021-12-12: qty 50

## 2021-12-12 MED ORDER — ACETAMINOPHEN 325 MG PO TABS
650.0000 mg | ORAL_TABLET | Freq: Four times a day (QID) | ORAL | Status: DC | PRN
  Administered 2021-12-12 – 2021-12-13 (×2): 650 mg via ORAL
  Filled 2021-12-12 (×2): qty 2

## 2021-12-12 MED ORDER — IPRATROPIUM-ALBUTEROL 0.5-2.5 (3) MG/3ML IN SOLN
3.0000 mL | Freq: Two times a day (BID) | RESPIRATORY_TRACT | Status: DC
  Administered 2021-12-12 – 2021-12-13 (×2): 3 mL via RESPIRATORY_TRACT
  Filled 2021-12-12 (×3): qty 3

## 2021-12-12 MED ORDER — ALBUTEROL SULFATE (2.5 MG/3ML) 0.083% IN NEBU: 2.5000 mg | INHALATION_SOLUTION | RESPIRATORY_TRACT | Status: DC | PRN

## 2021-12-12 MED ORDER — ALBUTEROL SULFATE HFA 108 (90 BASE) MCG/ACT IN AERS: 2.0000 | INHALATION_SPRAY | RESPIRATORY_TRACT | Status: DC | PRN

## 2021-12-12 MED ORDER — METHYLPREDNISOLONE SODIUM SUCC 125 MG IJ SOLR
125.0000 mg | Freq: Once | INTRAMUSCULAR | Status: AC
  Administered 2021-12-12: 125 mg via INTRAVENOUS
  Filled 2021-12-12: qty 2

## 2021-12-12 MED ORDER — ACETAMINOPHEN 650 MG RE SUPP: 650.0000 mg | Freq: Four times a day (QID) | RECTAL | Status: DC | PRN

## 2021-12-12 MED ORDER — AMLODIPINE BESYLATE 5 MG PO TABS
5.0000 mg | ORAL_TABLET | Freq: Every day | ORAL | Status: DC
  Administered 2021-12-12 – 2021-12-13 (×2): 5 mg via ORAL
  Filled 2021-12-12 (×2): qty 1

## 2021-12-12 MED ORDER — IPRATROPIUM-ALBUTEROL 0.5-2.5 (3) MG/3ML IN SOLN
3.0000 mL | Freq: Once | RESPIRATORY_TRACT | Status: DC
  Filled 2021-12-12: qty 3

## 2021-12-12 MED ORDER — METHYLPREDNISOLONE SODIUM SUCC 125 MG IJ SOLR: 80.0000 mg | Freq: Two times a day (BID) | INTRAMUSCULAR | Status: DC

## 2021-12-12 NOTE — H&P (Signed)
History and Physical    Patient: Brenda Gibson DOB: 04-27-1941 DOA: 12/12/2021 DOS: the patient was seen and examined on 12/12/2021 PCP: Pcp, No  Patient coming from: Home  Chief Complaint:  Chief Complaint  Patient presents with   Shortness of Breath    HPI: Brenda Gibson is a 81 y.o. female with medical history significant of nasal basal cell CVA, cataracts, COVID-19 infection, hypertension, hyperglycemia, bilateral knee pain, measles, unspecified myalgia, osteoporosis, seborrheic dermatitis, tobacco abuse, varicella, SBO who is coming to the emergency department with complaints of progressively worse cough, wheezing and chest congestion for several days with significant dyspnea that developed yesterday to the point that she had to call EMS.  Cough is occasionally productive of whitish sputum.  No fever, chills, sore throat or hemoptysis.  No typical or pleuritic chest pain.  Denied dizziness, diaphoresis, palpitations, lower extremity edema, abdominal pain, nausea, emesis, diarrhea, constipation, melena or hematochezia.  No dysuria, frequency or hematuria.  No polyuria, polydipsia, polyphagia or blurry vision.  ED course: Initial vital signs were temperature 98.5 F, pulse 65, respiration 18, BP 188/92 mmHg O2 sat 95% on room air.  However, the patient desaturated to the high 80s 2 hours after arrival to the emergency department.  She was placed on nasal cannula oxygen at 3 LPM.  She was given 125 mg of Solu-Medrol and nebulized bronchodilators.  Lab work: CBC had a white count 8.6, hemoglobin 13.3 g/dL and platelets 306.  Troponin x2 negative.  BMP showed a glucose of 117 mg/dL, but was otherwise normal  Imaging: A portable 1 view chest radiograph did not show any active disease.  Review of Systems: As mentioned in the history of present illness. All other systems reviewed and are negative. Past Medical History:  Diagnosis Date   Basal cell carcinoma of ala nasi     Cataract    COVID-19 virus infection 05/22/2021   Essential hypertension, benign 06/05/2013   Mild, progressive hypertension. Isolated systolic hypertension.   Serial BPs:  --08/02/13: 130/80, recent measurements from home range from 137/73 to 149/75 with higher measurement being when skipping furosemide due to overactive bladder symptoms --06/05/13: 150/86 --02/22/13: 118/60   Hyperglycemia 05/22/2021   Hypertension    Knee pain, bilateral    Measles    Myalgia    Osteoporosis 08/12/2014   Seborrheic dermatitis of scalp    Small bowel obstruction ([place])    Tobacco abuse 02/24/2011   Varicella    Past Surgical History:  Procedure Laterality Date   BASAL CELL CARCINOMA EXCISION     '11 ([facility])   CATARACT EXTRACTION W/ INTRAOCULAR LENS IMPLANT     right eye. '09 (Dr. Dolores Lory)   LAPAROTOMY N/A 06/25/2016   Procedure: EXPLORATORY LAPAROTOMY, lysis of adhesion;  Surgeon: Coralie Keens, MD;  Location: [facility];  Service: General;  Laterality: N/A;   LYSIS OF ADHESION  06/25/2016   Dr Nedra Hai   Social History:  reports that she has been smoking cigarettes. She has a 31.20 pack-year smoking history. She has never used smokeless tobacco. She reports current alcohol use. She reports that she does not use drugs.  No Known Allergies  Family History  Problem Relation Age of Onset   Alcohol abuse Mother    Cancer Father 62       stomach cancer   Prostate cancer Father    Hypertension Maternal Grandfather    Diabetes Neg Hx    Heart disease Neg Hx     Prior to  Admission medications   Medication Sig Start Date End Date Taking? Authorizing Provider  albuterol (VENTOLIN HFA) 108 (90 Base) MCG/ACT inhaler Inhale 2 puffs into the lungs every 4 (four) hours as needed for wheezing or shortness of breath. 06/01/21   Elodia Florence., MD  amLODipine (NORVASC) 10 MG tablet Please take 1 tablet (10mg ) by mouth daily 11/12/21   Mickie Hillier, PA-C  tiotropium (SPIRIVA HANDIHALER) 18 MCG inhalation  capsule Place 1 capsule (18 mcg total) into inhaler and inhale daily. 06/01/21   Elodia Florence., MD    Physical Exam: Vitals:   12/12/21 0400 12/12/21 0420 12/12/21 0430 12/12/21 0500  BP: (!) 145/71  (!) 162/72 (!) 145/72  Pulse: 64  65 63  Resp: 18  19 18   Temp:      TempSrc:      SpO2: 91% (!) 87% 93% (!) 88%   Physical Exam Vitals and nursing note reviewed.  Constitutional:      Comments: Chronically ill-appearing.  HENT:     Head: Normocephalic and atraumatic.  Eyes:     Pupils: Pupils are equal, round, and reactive to light.  Cardiovascular:     Rate and Rhythm: Normal rate and regular rhythm.  Pulmonary:     Effort: Pulmonary effort is normal. No tachypnea.     Breath sounds: Decreased breath sounds and wheezing present. No rhonchi or rales.  Abdominal:     Palpations: Abdomen is soft.     Tenderness: There is no abdominal tenderness.  Musculoskeletal:     Cervical back: Neck supple.     Right lower leg: No edema.     Left lower leg: No edema.  Skin:    General: Skin is warm and dry.  Neurological:     General: No focal deficit present.     Mental Status: She is alert and oriented to person, place, and time.  Psychiatric:        Mood and Affect: Mood normal.        Behavior: Behavior normal.   Data Reviewed:  There are no new results to review at this time.  Assessment and Plan: Principal Problem:   Acute respiratory failure with hypoxia (HCC) In the setting of   Acute exacerbation of chronic obstructive pulmonary disease (COPD) (HCC) Telemetry/inpatient. Continue supplemental oxygen. Consult respiratory care treatment. DuoNeb 4 times a day. Albuterol neb every 4 hours as needed. Continue Solu-Medrol followed by prednisone taper. Smoking cessation advised.  Active Problems:   Tobacco abuse Declined nicotine replacement therapy. Smoking cessation is advised.    Essential hypertension, benign Continue amlodipine 5 mg p.o. daily. Monitor  blood pressure and heart rate during    Hyperglycemia Monitor blood glucose while on glucocorticoids.    Advance Care Planning:   Code Status: Full Code   Consults:   Family Communication:   Severity of Illness: The appropriate patient status for this patient is OBSERVATION. Observation status is judged to be reasonable and necessary in order to provide the required intensity of service to ensure the patient's safety. The patient's presenting symptoms, physical exam findings, and initial radiographic and laboratory data in the context of their medical condition is felt to place them at decreased risk for further clinical deterioration. Furthermore, it is anticipated that the patient will be medically stable for discharge from the hospital within 2 midnights of admission.   Author: Reubin Milan, MD 12/12/2021 7:49 AM  For on call review [email or web address].   This document was prepared  using Dragon voice remission software and may contain some unintended transcription errors.

## 2021-12-12 NOTE — ED Triage Notes (Addendum)
Pt has had coughing and congestion for a few days- shortness of breath worsened today, and EMS was called. hx of COPD
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
## 2021-12-12 NOTE — Progress Notes (Addendum)
ADM: COPE EXACERBATION HOME REGIMEN: ALBUTEROL INHALER Q4 PRN, SPIRIVA QD CURRENT THERAPY: ALBUTEROL Q4 PRN, DUONEB QID PLAN: DUONEB BID, ALBUTEROL Q4 PRN MEDICAL HX: COPD, COVID POSITIVE, SOB ASSESSMENT: BS-CL/DIM, SATS 96% ON 3 LPM [place], NO SOB, NO WOB X-RAY: 12/12/21 NO PNA, CLEAR CONSULT TO RESPIRATORY. RT CHANGED THERAPY BASED ON PATIENT CONDITION. RT WILL BE GIVEN DUONEB BID IN PLACE OF HER SPIRIVA QD. PT HERE FOR COPD EXACERBATION IN WHICH SHE HAS BEEN TREATED FOR OVER THE PAST WEEK. PT NOW POSITIVE FOR COVID. PT SCORED 4 ON ASSESSMENT WHICH IS PRN SCHEDULE.

## 2021-12-12 NOTE — ED Notes (Signed)
Pt ambulated to and from restroom without assistance and no difficulty.

## 2021-12-13 DIAGNOSIS — J441 Chronic obstructive pulmonary disease with (acute) exacerbation: Secondary | ICD-10-CM | POA: Diagnosis not present

## 2021-12-13 MED ORDER — AMLODIPINE BESYLATE 5 MG PO TABS: 5.0000 mg | ORAL_TABLET | Freq: Every day | ORAL | 0 refills | Status: DC

## 2021-12-13 MED ORDER — MOMETASONE FURO-FORMOTEROL FUM 200-5 MCG/ACT IN AERO
2.0000 | INHALATION_SPRAY | Freq: Two times a day (BID) | RESPIRATORY_TRACT | 1 refills | Status: DC
Start: 2021-12-13 — End: 2022-11-08

## 2021-12-13 MED ORDER — PREDNISONE 10 MG PO TABS: ORAL_TABLET | ORAL | 0 refills | Status: DC

## 2021-12-13 NOTE — Plan of Care (Signed)
  Problem: Education: Goal: Knowledge of General Education information will improve Description: Including pain rating scale, medication(s)/side effects and non-pharmacologic comfort measures Outcome: Progressing   Problem: Safety: Goal: Ability to remain free from injury will improve Outcome: Progressing   

## 2021-12-13 NOTE — Progress Notes (Signed)
SATURATION QUALIFICATIONS: (This note is used to comply with regulatory documentation for home oxygen)  Patient Saturations on Room Air at Rest = 95%  Patient Saturations on Room Air while Ambulating = 91%  Patient Saturations on 0 Liters of oxygen while Ambulating = N/A  Please briefly explain why patient needs home oxygen:

## 2021-12-13 NOTE — TOC CM/SW Note (Signed)
°  Transition of Care [facility]) Screening Note   Patient Details  Name: GEMINI BUNTE Date of Birth: Apr 19, 1941   Transition of Care [facility]) CM/SW Contact:    Ross Ludwig, LCSW Phone Number: 12/13/2021, 2:39 PM    Transition of Care Department [facility]) has reviewed patient and no TOC needs have been identified at this time. We will continue to monitor patient advancement through interdisciplinary progression rounds. If new patient transition needs arise, please place a TOC consult.

## 2021-12-14 NOTE — Discharge Summary (Signed)
Physician Discharge Summary   Patient: Brenda Gibson MRN: [id number] DOB: Nov 16, 1940  Admit date:     12/12/2021  Discharge date: 12/13/2021  Discharge Physician: Berle Mull   PCP: Pcp, No   Recommendations at discharge:    Follow up with PCP in 1 week  Discharge Diagnoses: Principal Problem:   Acute exacerbation of chronic obstructive pulmonary disease (COPD) ([place]) Active Problems:   Tobacco abuse   Essential hypertension, benign   Hyperglycemia   Acute respiratory failure with hypoxia (HCC)  Resolved Problems:   * No resolved hospital problems. [facility] Course:   Acute respiratory failure with hypoxia (HCC) In the setting of   Acute exacerbation of chronic obstructive pulmonary disease (COPD) (HCC) Telemetry/inpatient. Continue supplemental oxygen. Consult respiratory care treatment. DuoNeb 4 times a day. Albuterol neb every 4 hours as needed. Continue prednisone taper. Will send inhalers scripts.  Smoking cessation advised.     Tobacco abuse Declined nicotine replacement therapy. Smoking cessation is advised.     Essential hypertension, benign Continue amlodipine 5 mg p.o. daily.     Hyperglycemia Resolved   Underweight  Body mass index is 15.63 kg/m.  Placing the pt at high risk of poor outcomes  Recommended to follow up with PCP   Lung nodule  Recommended to have an outpt CT chest     Consultants: none Procedures performed: none  Disposition: Home Diet recommendation:  Discharge Diet Orders (From admission, onward)     Start     Ordered   12/13/21 0000  Diet - low sodium heart healthy        12/13/21 1315           DISCHARGE MEDICATION: Allergies as of 12/13/2021   No Known Allergies      Medication List     TAKE these medications    albuterol 108 (90 Base) MCG/ACT inhaler Commonly known as: VENTOLIN HFA Inhale 2 puffs into the lungs every 4 (four) hours as needed for wheezing or shortness of breath.   amLODipine 5 MG  tablet Commonly known as: NORVASC Take 1 tablet (5 mg total) by mouth daily. What changed:  medication strength how much to take how to take this when to take this additional instructions   mometasone-formoterol 200-5 MCG/ACT Aero Commonly known as: DULERA Inhale 2 puffs into the lungs in the morning and at bedtime.   predniSONE 10 MG tablet Commonly known as: DELTASONE Take 40mg  daily for 3days,Take 30mg  daily for 3days,Take 20mg  daily for 3days,Take 10mg  daily for 3days, then stop   Spiriva HandiHaler 18 MCG inhalation capsule Generic drug: tiotropium Place 1 capsule (18 mcg total) into inhaler and inhale daily.        Follow-up Information     PCP Follow up.   Why: establish care and follow up. need a follow up CT chest for lung nodule.                Discharge Exam: Filed Weights   12/12/21 1804 12/13/21 0500  Weight: 54.4 kg 42.6 kg   General: Appear in mild distress, no Rash; Oral Mucosa Clear, moist. no Abnormal Neck Mass Or lumps, Conjunctiva normal  Cardiovascular: S1 and S2 Present, no Murmur, Respiratory: good respiratory effort, Bilateral Air entry present and CTA, no Crackles, no wheezes Abdomen: Bowel Sound present, Soft and no tenderness Extremities: no Pedal edema Neurology: alert and oriented to time, place, and person affect appropriate. no new focal deficit Gait not checked due to patient safety  concerns   Condition at discharge: good  The results of significant diagnostics from this hospitalization (including imaging, microbiology, ancillary and laboratory) are listed below for reference.   Imaging Studies: DG Chest 2 View  Result Date: 12/03/2021 CLINICAL DATA:  Dyspnea EXAM: CHEST - 2 VIEW COMPARISON:  10/05/2021 chest radiograph. FINDINGS: Stable cardiomediastinal silhouette with normal heart size. No pneumothorax. No pleural effusion. Hyperinflated lungs. No pulmonary edema. No acute consolidative airspace disease. IMPRESSION:  Hyperinflated lungs, suggesting COPD. Otherwise no active cardiopulmonary disease. Electronically Signed   By: Ilona Sorrel M.D.   On: 12/03/2021 09:52   DG Chest Port 1 View  Result Date: 12/12/2021 CLINICAL DATA:  Shortness of breath. EXAM: PORTABLE CHEST 1 VIEW COMPARISON:  Chest radiograph dated 12/03/2021. FINDINGS: No focal consolidation, pleural effusion, or pneumothorax. The cardiac silhouette is within normal limits. No acute osseous pathology. IMPRESSION: No active disease. Electronically Signed   By: Anner Crete M.D.   On: 12/12/2021 02:59    Microbiology: Results for orders placed or performed during the hospital encounter of 12/12/21  Resp Panel by RT-PCR (Flu A&B, Covid) Nasopharyngeal Swab     Status: None   Collection Time: 12/12/21  4:51 AM   Specimen: Nasopharyngeal Swab; Nasopharyngeal(NP) swabs in vial transport medium  Result Value Ref Range Status   SARS Coronavirus 2 by RT PCR NEGATIVE NEGATIVE Final    Comment: (NOTE) SARS-CoV-2 target nucleic acids are NOT DETECTED.  The SARS-CoV-2 RNA is generally detectable in upper respiratory specimens during the acute phase of infection. The lowest concentration of SARS-CoV-2 viral copies this assay can detect is 138 copies/mL. A negative result does not preclude SARS-Cov-2 infection and should not be used as the sole basis for treatment or other patient management decisions. A negative result may occur with  improper specimen collection/handling, submission of specimen other than nasopharyngeal swab, presence of viral mutation(s) within the areas targeted by this assay, and inadequate number of viral copies(<138 copies/mL). A negative result must be combined with clinical observations, patient history, and epidemiological information. The expected result is Negative.  Fact Sheet for Patients:  EntrepreneurPulse.com.au  Fact Sheet for Healthcare Providers:   IncredibleEmployment.be  This test is no t yet approved or cleared by the [place] FDA and  has been authorized for detection and/or diagnosis of SARS-CoV-2 by FDA under an Emergency Use Authorization (EUA). This EUA will remain  in effect (meaning this test can be used) for the duration of the COVID-19 declaration under Section 564(b)(1) of the Act, 21 U.S.C.section 360bbb-3(b)(1), unless the authorization is terminated  or revoked sooner.       Influenza A by PCR NEGATIVE NEGATIVE Final   Influenza B by PCR NEGATIVE NEGATIVE Final    Comment: (NOTE) The Xpert Xpress SARS-CoV-2/FLU/RSV plus assay is intended as an aid in the diagnosis of influenza from Nasopharyngeal swab specimens and should not be used as a sole basis for treatment. Nasal washings and aspirates are unacceptable for Xpert Xpress SARS-CoV-2/FLU/RSV testing.  Fact Sheet for Patients: EntrepreneurPulse.com.au  Fact Sheet for Healthcare Providers: IncredibleEmployment.be  This test is not yet approved or cleared by the [place] FDA and has been authorized for detection and/or diagnosis of SARS-CoV-2 by FDA under an Emergency Use Authorization (EUA). This EUA will remain in effect (meaning this test can be used) for the duration of the COVID-19 declaration under Section 564(b)(1) of the Act, 21 U.S.C. section 360bbb-3(b)(1), unless the authorization is terminated or revoked.  Performed at [facility],  [place] [address] [place], [place] [address]     Labs: CBC: Recent Labs  Lab 12/12/21 0245  WBC 8.6  HGB 13.3  HCT 43.1  MCV 86.5  PLT 622   Basic Metabolic Panel: Recent Labs  Lab 12/12/21 0245 12/12/21 1825  NA 140  --   K 3.6  --   CL 103  --   CO2 31  --   GLUCOSE 117*  --   BUN 13  --   CREATININE 0.70  --   CALCIUM 8.9  --   PHOS  --  3.2   Liver Function Tests: No results for input(s): AST, ALT,  ALKPHOS, BILITOT, PROT, ALBUMIN in the last 168 hours. CBG: No results for input(s): GLUCAP in the last 168 hours.  Discharge time spent: greater than 30 minutes.  Signed: Berle Mull, MD [facility]

## 2021-12-29 ENCOUNTER — Emergency Department (HOSPITAL_COMMUNITY): Payer: Medicare Other

## 2021-12-29 ENCOUNTER — Encounter (HOSPITAL_COMMUNITY): Payer: Self-pay | Admitting: *Deleted

## 2021-12-29 ENCOUNTER — Other Ambulatory Visit: Payer: Self-pay

## 2021-12-29 ENCOUNTER — Emergency Department (HOSPITAL_COMMUNITY)
Admission: EM | Admit: 2021-12-29 | Discharge: 2021-12-29 | Disposition: A | Discharge status: Home / Self Care | Payer: Medicare Other | Attending: Emergency Medicine | Admitting: Emergency Medicine

## 2021-12-29 DIAGNOSIS — E876 Hypokalemia: Secondary | ICD-10-CM | POA: Diagnosis not present

## 2021-12-29 DIAGNOSIS — Z7951 Long term (current) use of inhaled steroids: Secondary | ICD-10-CM | POA: Diagnosis not present

## 2021-12-29 DIAGNOSIS — Z79899 Other long term (current) drug therapy: Secondary | ICD-10-CM | POA: Diagnosis not present

## 2021-12-29 DIAGNOSIS — R059 Cough, unspecified: Secondary | ICD-10-CM | POA: Diagnosis not present

## 2021-12-29 DIAGNOSIS — I10 Essential (primary) hypertension: Secondary | ICD-10-CM | POA: Insufficient documentation

## 2021-12-29 DIAGNOSIS — J441 Chronic obstructive pulmonary disease with (acute) exacerbation: Secondary | ICD-10-CM | POA: Diagnosis not present

## 2021-12-29 DIAGNOSIS — D649 Anemia, unspecified: Secondary | ICD-10-CM | POA: Diagnosis not present

## 2021-12-29 DIAGNOSIS — Z20822 Contact with and (suspected) exposure to covid-19: Secondary | ICD-10-CM | POA: Diagnosis not present

## 2021-12-29 DIAGNOSIS — R0602 Shortness of breath: Secondary | ICD-10-CM | POA: Diagnosis not present

## 2021-12-29 DIAGNOSIS — Z720 Tobacco use: Secondary | ICD-10-CM | POA: Diagnosis not present

## 2021-12-29 LAB — CBC WITH DIFFERENTIAL/PLATELET
Abs Immature Granulocytes: 0.01 10*3/uL (ref 0.00–0.07)
Basophils Absolute: 0 10*3/uL (ref 0.0–0.1)
Basophils Relative: 1 %
Eosinophils Absolute: 0 10*3/uL (ref 0.0–0.5)
Eosinophils Relative: 1 %
HCT: 38.8 % (ref 36.0–46.0)
Hemoglobin: 11.7 g/dL — ABNORMAL LOW (ref 12.0–15.0)
Immature Granulocytes: 0 %
Lymphocytes Relative: 23 %
Lymphs Abs: 1.3 10*3/uL (ref 0.7–4.0)
MCH: 26.7 pg (ref 26.0–34.0)
MCHC: 30.2 g/dL (ref 30.0–36.0)
MCV: 88.6 fL (ref 80.0–100.0)
Monocytes Absolute: 0.5 10*3/uL (ref 0.1–1.0)
Monocytes Relative: 9 %
Neutro Abs: 3.8 10*3/uL (ref 1.7–7.7)
Neutrophils Relative %: 66 %
Platelets: 248 10*3/uL (ref 150–400)
RBC: 4.38 MIL/uL (ref 3.87–5.11)
RDW: 15.9 % — ABNORMAL HIGH (ref 11.5–15.5)
WBC: 5.7 10*3/uL (ref 4.0–10.5)
nRBC: 0 % (ref 0.0–0.2)

## 2021-12-29 LAB — BASIC METABOLIC PANEL
Anion gap: 4 — ABNORMAL LOW (ref 5–15)
BUN: 11 mg/dL (ref 8–23)
CO2: 30 mmol/L (ref 22–32)
Calcium: 8.5 mg/dL — ABNORMAL LOW (ref 8.9–10.3)
Chloride: 107 mmol/L (ref 98–111)
Creatinine, Ser: 0.6 mg/dL (ref 0.44–1.00)
GFR, Estimated: >60 mL/min (ref >60)
Glucose, Bld: 95 mg/dL (ref 70–99)
Potassium: 3.4 mmol/L — ABNORMAL LOW (ref 3.5–5.1)
Sodium: 141 mmol/L (ref 135–145)

## 2021-12-29 LAB — RESP PANEL BY RT-PCR (FLU A&B, COVID) ARPGX2
Influenza A by PCR: NEGATIVE
Influenza B by PCR: NEGATIVE
SARS Coronavirus 2 by RT PCR: NEGATIVE

## 2021-12-29 MED ORDER — PREDNISONE 10 MG PO TABS: ORAL_TABLET | ORAL | 0 refills | Status: DC

## 2021-12-29 MED ORDER — AMLODIPINE BESYLATE 5 MG PO TABS
5.0000 mg | ORAL_TABLET | Freq: Once | ORAL | Status: AC
  Administered 2021-12-29: 5 mg via ORAL
  Filled 2021-12-29: qty 1

## 2021-12-29 MED ORDER — ALBUTEROL SULFATE HFA 108 (90 BASE) MCG/ACT IN AERS
2.0000 | INHALATION_SPRAY | RESPIRATORY_TRACT | Status: DC | PRN
  Administered 2021-12-29: 2 via RESPIRATORY_TRACT
  Filled 2021-12-29: qty 6.7

## 2021-12-29 NOTE — Discharge Instructions (Signed)
Your work-up today was reassuring, try taking prednisone as needed at home.  Take your home medicine as prescribed.  Follow-up with your primary care doctor this week for reevaluation.  Return if worse.

## 2021-12-29 NOTE — ED Notes (Signed)
Pt ambulatory to waiting room. Pt verbalized understanding of discharge instructions.   

## 2021-12-29 NOTE — ED Provider Notes (Signed)
[facility] DEPT Provider Note   CSN: [id number] Arrival date & time: 12/29/21  0442     History  Chief Complaint  Patient presents with   Shortness of Breath    RAELEY Gibson is a 81 y.o. female.  The history is provided by the patient, the EMS personnel and medical records.  Shortness of Breath  Patient with medical history notable for COPD, hypertension, current tobacco use disorder, presenting today with shortness of breath.  States it woke her up from her sleep, started an hour ago.  Has not been using her inhaler at home, clear sputum.  Denies any chest pain or lower extremity swelling.    Home Medications Prior to Admission medications   Medication Sig Start Date End Date Taking? Authorizing Provider  albuterol (VENTOLIN HFA) 108 (90 Base) MCG/ACT inhaler Inhale 2 puffs into the lungs every 4 (four) hours as needed for wheezing or shortness of breath. 06/01/21   Elodia Florence., MD  amLODipine (NORVASC) 5 MG tablet Take 1 tablet (5 mg total) by mouth daily. 12/14/21   Lavina Hamman, MD  mometasone-formoterol [facility]) 200-5 MCG/ACT AERO Inhale 2 puffs into the lungs in the morning and at bedtime. 12/13/21   Lavina Hamman, MD  predniSONE (DELTASONE) 10 MG tablet Take 40mg  daily for 3days,Take 30mg  daily for 3days,Take 20mg  daily for 3days,Take 10mg  daily for 3days, then stop 12/29/21   Sherrill Raring, PA-C  tiotropium (SPIRIVA HANDIHALER) 18 MCG inhalation capsule Place 1 capsule (18 mcg total) into inhaler and inhale daily. 06/01/21   Elodia Florence., MD      Allergies    Patient has no known allergies.    Review of Systems   Review of Systems  Respiratory:  Positive for shortness of breath.    Physical Exam Updated Vital Signs BP (!) 179/71 (BP Location: Right Arm)    Pulse (!) 55    Temp 98.5 F (36.9 C) (Oral)    Resp 18    SpO2 98%  Physical Exam Vitals and nursing note reviewed.  Constitutional:      General: She is not  in acute distress.    Appearance: She is well-developed.  HENT:     Head: Normocephalic and atraumatic.  Eyes:     Conjunctiva/sclera: Conjunctivae normal.  Cardiovascular:     Rate and Rhythm: Normal rate and regular rhythm.     Heart sounds: No murmur heard. Pulmonary:     Effort: Pulmonary effort is normal. No tachypnea or respiratory distress.     Breath sounds: Examination of the right-upper field reveals wheezing. Decreased breath sounds and wheezing present.  Chest:     Chest wall: No tenderness.  Abdominal:     Palpations: Abdomen is soft.     Tenderness: There is no abdominal tenderness.  Musculoskeletal:        General: No swelling.     Cervical back: Neck supple.     Right lower leg: No edema.     Left lower leg: No edema.  Skin:    General: Skin is warm and dry.     Capillary Refill: Capillary refill takes less than 2 seconds.  Neurological:     Mental Status: She is alert.  Psychiatric:        Mood and Affect: Mood normal.    ED Results / Procedures / Treatments   Labs (all labs ordered are listed, but only abnormal results are displayed) Labs Reviewed  CBC WITH  DIFFERENTIAL/PLATELET - Abnormal; Notable for the following components:      Result Value   Hemoglobin 11.7 (*)    RDW 15.9 (*)    All other components within normal limits  BASIC METABOLIC PANEL - Abnormal; Notable for the following components:   Potassium 3.4 (*)    Calcium 8.5 (*)    Anion gap 4 (*)    All other components within normal limits  RESP PANEL BY RT-PCR (FLU A&B, COVID) [id number]    EKG EKG Interpretation  Date/Time:  Tuesday December 29 2021 05:13:26 EST Ventricular Rate:  58 PR Interval:  146 QRS Duration: 95 QT Interval:  421 QTC Calculation: 414 R Axis:   86 Text Interpretation: Sinus rhythm Probable left atrial enlargement Consider right ventricular hypertrophy Nonspecific T abnormalities, lateral leads Confirmed by Addison Lank [phone]) on 12/29/2021 6:37:04  AM  Radiology DG Chest 2 View  Result Date: 12/29/2021 CLINICAL DATA:  Cough, shortness of breath EXAM: CHEST - 2 VIEW COMPARISON:  12/12/2021 FINDINGS: Lungs are hyperinflated as can be seen with COPD. No focal consolidation. No pleural effusion or pneumothorax. Heart and mediastinal contours are unremarkable. No acute osseous abnormality. Old right posterior lower rib fracture. IMPRESSION: No active cardiopulmonary disease. Electronically Signed   By: Kathreen Devoid M.D.   On: 12/29/2021 06:37    Procedures Procedures    Medications Ordered in ED Medications  amLODipine (NORVASC) tablet 5 mg (5 mg Oral Given 12/29/21 6045)    ED Course/ Medical Decision Making/ A&P Clinical Course as of 12/29/21 1501  Tue Dec 29, 2021  0805 DG Chest 2 View [HS]    Clinical Course User Index [HS] Sherrill Raring, [place]                           Medical Decision Making Amount and/or Complexity of Data Reviewed Labs: ordered. Radiology: ordered. Decision-making details documented in ED Course.  Risk Prescription drug management.   This patient presents to the ED for concern of SOB, this involves an extensive number of treatment options, and is a complaint that carries with it a high risk of complications and morbidity.  The differential diagnosis but is not limited to: includes COPD exacerbation, pneumonia, pneumothorax, pulmonary hypertension, heart failure, PE, ACS  Patients presentation is complicated by their history of COPD and concurrent tobacco use high risk for exacerbation and chronic shortness of breath/decreased lung sounds.   Additional history obtained:   Independent historian: EMS note reviewed.  Brought from home with shortness of breath, medicine not administered at that time.  I reviewed Recent hospitalist note from admission on 12/12/21.  Additionally spread patient's most recent echo which was performed 05/27/2021.  Did not show any signs of underlying heart failure.   Lab  Tests:  I ordered, viewed, and personally interpreted labs.  The pertinent results include: CBC does not show any underlying leukocytosis.  Stable anemia with a hemoglobin of 11.7 roughly decreased from 3 weeks prior at 13.7.  Denies any rectal bleeding or blood loss. BMP shows mild hypokalemia at 3.4.  Minor hypocalcemia at 8.5.  No underlying AKI    Imaging Studies ordered:  I directly visualized the chest x-ray, which showed hyperinflation no underlying pneumonia or pneumothorax. I agree with the radiologist interpretation    ECG/Cardiac monitoring:   Per my interpretation, EKG shows sinus bradycardia with a heart rate of 58.  RVH, additionally some T wave inversion in the lateral leads.  This appears  to be present at prior EKG 3 weeks ago.  The patient was maintained on a cardiac monitor.  Visualized monitor strip which showed sinus bradycardia per my interpretation.    Medicines ordered and prescription drug management:  I ordered medication including: Albuterol, amlodipine  I have reviewed the patients home medicines and have made adjustments as needed    Reevaluation:  After the interventions noted above, I reevaluated the patient and found improvement of patient's symptoms.  Wheezing improved, patient asymptomatic   Problems addressed / ED Course:  81 year old female with history of COPD presenting due to shortness of breath.  She is not hypoxic, not tachypneic.  Slightly elevated blood pressure, amlodipine ordered.  Patient given albuterol, there is some slight wheezing but not diffuse.  Chest x-ray does not show any signs of underlying pneumonia or pneumothorax.  She is not having any chest pain, EKG roughly at baseline.  The absence of chest pain, do not feel troponin indicated.  Discussed with patient who is in agreement.  Do not feel patient needs any additional work-up or admission for observation at this time.  Will discharge with prednisone burst.      Disposition:   After consideration of the diagnostic results and the patients response to treatment, I feel that the patent would benefit from D/C.  Discussed HPI, physical exam and plan of care for this patient with attending Godfrey Pick. The attending physician evaluated this patient as part of a shared visit and agrees with plan of care.           Final Clinical Impression(s) / ED Diagnoses Final diagnoses:  COPD exacerbation ([place])    Rx / DC Orders ED Discharge Orders          Ordered    predniSONE (DELTASONE) 10 MG tablet        12/29/21 0921              Sherrill Raring, PA-C 12/29/21 1501    Godfrey Pick, MD 12/30/21 [phone]

## 2021-12-29 NOTE — ED Triage Notes (Signed)
Pt arrives via [facility], pt says he has felt short of breath since she woke up about 1 hour ago. Clear sputum cough. Denies fevers.

## 2022-01-29 ENCOUNTER — Encounter (HOSPITAL_COMMUNITY): Payer: Self-pay

## 2022-01-29 ENCOUNTER — Emergency Department (HOSPITAL_COMMUNITY)
Admission: EM | Admit: 2022-01-29 | Discharge: 2022-01-30 | Disposition: A | Discharge status: Home / Self Care | Payer: Medicare Other | Attending: Emergency Medicine | Admitting: Emergency Medicine

## 2022-01-29 ENCOUNTER — Other Ambulatory Visit: Payer: Self-pay

## 2022-01-29 ENCOUNTER — Emergency Department (HOSPITAL_COMMUNITY): Payer: Medicare Other

## 2022-01-29 DIAGNOSIS — S8991XA Unspecified injury of right lower leg, initial encounter: Secondary | ICD-10-CM | POA: Diagnosis not present

## 2022-01-29 DIAGNOSIS — Z7952 Long term (current) use of systemic steroids: Secondary | ICD-10-CM | POA: Diagnosis not present

## 2022-01-29 DIAGNOSIS — J449 Chronic obstructive pulmonary disease, unspecified: Secondary | ICD-10-CM | POA: Diagnosis not present

## 2022-01-29 DIAGNOSIS — S82141A Displaced bicondylar fracture of right tibia, initial encounter for closed fracture: Secondary | ICD-10-CM | POA: Diagnosis not present

## 2022-01-29 DIAGNOSIS — Z7951 Long term (current) use of inhaled steroids: Secondary | ICD-10-CM | POA: Insufficient documentation

## 2022-01-29 DIAGNOSIS — M25461 Effusion, right knee: Secondary | ICD-10-CM | POA: Diagnosis not present

## 2022-01-29 DIAGNOSIS — Z79899 Other long term (current) drug therapy: Secondary | ICD-10-CM | POA: Insufficient documentation

## 2022-01-29 DIAGNOSIS — I10 Essential (primary) hypertension: Secondary | ICD-10-CM | POA: Diagnosis not present

## 2022-01-29 DIAGNOSIS — W06XXXA Fall from bed, initial encounter: Secondary | ICD-10-CM | POA: Insufficient documentation

## 2022-01-29 MED ORDER — FENTANYL CITRATE PF 50 MCG/ML IJ SOSY
50.0000 ug | PREFILLED_SYRINGE | Freq: Once | INTRAMUSCULAR | Status: AC
  Administered 2022-01-29: 50 ug via INTRAVENOUS
  Filled 2022-01-29: qty 1

## 2022-01-29 MED ORDER — TRAMADOL HCL 50 MG PO TABS: 50.0000 mg | ORAL_TABLET | Freq: Four times a day (QID) | ORAL | 0 refills | Status: AC | PRN

## 2022-01-29 NOTE — Discharge Instructions (Signed)
You were diagnosed today with a tibial plateau fracture. You should wear the immobilizer. You must stay non-weightbearing on the right leg. Call orthopedics first thing Monday morning for an appointment for further evaluation.  ?

## 2022-01-29 NOTE — ED Triage Notes (Signed)
Patient states she fell getting out of bed this AM. Patient states she twisted her right knee. ?Patient denies hitting her head or taking blood thinners. ?

## 2022-01-29 NOTE — ED Notes (Signed)
Pt's son is no longer in the lobby or at bedside. Attempted to call and did not get an answer nor voicemail. Son is patient's ride home.   ? ?

## 2022-01-29 NOTE — ED Notes (Signed)
Attempted to call pt's listed other contact, Suanne Marker, Daughter in law, no answer ? ?

## 2022-01-29 NOTE — ED Provider Notes (Signed)
?[facility] DEPT ?Provider Note ? ? ?CSN: [id number] ?Arrival date & time: 01/29/22  1750 ? ?  ? ?History ? ?Chief Complaint  ?Patient presents with  ? Fall  ? Knee Injury  ? ? ?Brenda Gibson is a 81 y.o. female.  Patient presents to the emergency department complaining of right knee pain.  The patient states that this morning upon getting out of bed she slipped, and fell twisting her right knee.  The patient stayed home throughout the day but the pain became more intense.  She denies hitting her head, denies loss of consciousness.  Pain with weightbearing.  PMH significant for osteoporosis, hypertension, histories of small bowel obstruction, hypokalemia, hypomagnesemia, physical deconditioning, COPD exacerbation, history of respiratory failure. ? ?Patient's last meal was at 5 PM today.  Patient denies use of blood thinners. ? ?HPI ? ?  ? ?Home Medications ?Prior to Admission medications   ?Medication Sig Start Date End Date Taking? Authorizing Provider  ?traMADol (ULTRAM) 50 MG tablet Take 1 tablet (50 mg total) by mouth every 6 (six) hours as needed for up to 4 days. 01/29/22 02/02/22 Yes Dorothyann Peng, PA-C  ?albuterol (VENTOLIN HFA) 108 (90 Base) MCG/ACT inhaler Inhale 2 puffs into the lungs every 4 (four) hours as needed for wheezing or shortness of breath. 06/01/21   Elodia Florence., MD  ?amLODipine (NORVASC) 5 MG tablet Take 1 tablet (5 mg total) by mouth daily. 12/14/21   Lavina Hamman, MD  ?mometasone-formoterol [facility]) 200-5 MCG/ACT AERO Inhale 2 puffs into the lungs in the morning and at bedtime. 12/13/21   Lavina Hamman, MD  ?predniSONE (DELTASONE) 10 MG tablet Take '40mg'$  daily for 3days,Take '30mg'$  daily for 3days,Take '20mg'$  daily for 3days,Take '10mg'$  daily for 3days, then stop 12/29/21   Sherrill Raring, PA-C  ?tiotropium (SPIRIVA HANDIHALER) 18 MCG inhalation capsule Place 1 capsule (18 mcg total) into inhaler and inhale daily. 06/01/21   Elodia Florence., MD  ?    ? ?Allergies    ?Patient has no known allergies.   ? ?Review of Systems   ?Review of Systems  ?Constitutional:  Negative for fever.  ?Respiratory:  Negative for shortness of breath.   ?Cardiovascular:  Negative for chest pain.  ?Gastrointestinal:  Negative for abdominal pain.  ?Musculoskeletal:  Positive for arthralgias.  ?     Pain in right knee  ?Neurological:  Negative for syncope.  ? ?Physical Exam ?Updated Vital Signs ?BP 114/68 (BP Location: Right Arm)   Pulse 66   Temp 98.3 ?F (36.8 ?C) (Oral)   Resp 17   Ht '5\' 5"'$  (1.651 m)   Wt 52.2 kg   SpO2 93%   BMI 19.14 kg/m?  ?Physical Exam ?Constitutional:   ?   General: She is not in acute distress. ?HENT:  ?   Head: Normocephalic and atraumatic.  ?Eyes:  ?   Conjunctiva/sclera: Conjunctivae normal.  ?Cardiovascular:  ?   Rate and Rhythm: Normal rate and regular rhythm.  ?   Pulses: Normal pulses.  ?Pulmonary:  ?   Effort: Pulmonary effort is normal.  ?   Breath sounds: Normal breath sounds.  ?Musculoskeletal:  ?   Cervical back: Normal range of motion.  ?   Comments: Pain with passive range of motion right knee.  Mild swelling noted.  Generalized tenderness to palpation  ?Skin: ?   General: Skin is warm and dry.  ?Neurological:  ?   Mental Status: She is alert.  ? ? ?  ED Results / Procedures / Treatments   ?Labs ?(all labs ordered are listed, but only abnormal results are displayed) ?Labs Reviewed - No data to display ? ?EKG ?None ? ?Radiology ?CT Knee Right Wo Contrast ? ?Result Date: 01/29/2022 ?CLINICAL DATA:  Evaluate for tibial plateau fracture. Status post fall. EXAM: CT OF THE RIGHT KNEE WITHOUT CONTRAST TECHNIQUE: Multidetector CT imaging of the right knee was performed according to the standard protocol. Multiplanar CT image reconstructions were also generated. RADIATION DOSE REDUCTION: This exam was performed according to the departmental dose-optimization program which includes automated exposure control, adjustment of the mA and/or kV according to  patient size and/or use of iterative reconstruction technique. COMPARISON:  Right knee x-ray 01/29/2022 FINDINGS: Bones/Joint/Cartilage There is an acute, fracture deformity involving the lateral tibial plateau, image 21/8. The fracture fragments are in near anatomic alignment. Very mild depression of the fracture fragments by only a few millimeters.There is no posterior displacement of the distal fracture fragments. Ligaments Suboptimally assessed by CT. Muscles and Tendons Unremarkable. Soft tissues Soft tissues are unremarkable.  No joint effusion. IMPRESSION: Acute, mildly depressed fracture involving the lateral tibial plateau. Electronically Signed   By: Kerby Moors M.D.   On: 01/29/2022 21:10  ? ?DG Knee Complete 4 Views Right ? ?Result Date: 01/29/2022 ?CLINICAL DATA:  Fall today with right knee injury.  Pain. EXAM: RIGHT KNEE - COMPLETE 4+ VIEW COMPARISON:  None. FINDINGS: There is diffuse decreased bone mineralization. There is mild depression of the lateral tibial plateau with multiple areas of curvilinear lucency. Tiny knee joint effusion. No dislocation. Mild vascular calcifications. IMPRESSION: Mildly depressed acute lateral tibial plateau fracture. Electronically Signed   By: Yvonne Kendall M.D.   On: 01/29/2022 19:08   ? ?Procedures ?Procedures  ? ? ?Medications Ordered in ED ?Medications  ?fentaNYL (SUBLIMAZE) injection 50 mcg (50 mcg Intravenous Given 01/29/22 2048)  ? ? ?ED Course/ Medical Decision Making/ A&P ?  ?                        ?Medical Decision Making ?Amount and/or Complexity of Data Reviewed ?Radiology: ordered. ? ? ?Right knee pain secondary to a fall.  Differential included fracture, ligamentous injury, tendon or muscle injury, and others. ? ?I ordered and interpreted plain x-rays of the right knee.  Mildly depressed acute lateral tibial plateau fracture.  I agree with the radiologist findings ? ?I discussed the case with Dr. [name], orthopedic surgeon on call.  The patient would  like to discharge home.  Dr. [name] recommends knee immobilization, nonweightbearing on the right leg, and calling orthopedics first thing Monday morning.  I referred the patient to Dr. Marcelino Scot for further evaluation and management.  Discharged home with a tramadol prescription.  The patient states that her son will be home to help take care of her. ? ? ?Final Clinical Impression(s) / ED Diagnoses ?Final diagnoses:  ?Injury of right knee, initial encounter  ? ? ?Rx / DC Orders ?ED Discharge Orders   ? ?      Ordered  ?  traMADol (ULTRAM) 50 MG tablet  Every 6 hours PRN       ? 01/29/22 2135  ? ?  ?  ? ?  ? ? ?  ?Dorothyann Peng, PA-C ?01/29/22 2138 ? ?  ?Gareth Morgan, MD ?01/30/22 1231 ? ?

## 2022-01-30 NOTE — ED Notes (Signed)
Pt's son returned to department and pt was discharged into his care in NAD ? ?

## 2022-02-08 ENCOUNTER — Encounter (HOSPITAL_COMMUNITY): Payer: Self-pay

## 2022-02-08 ENCOUNTER — Emergency Department (HOSPITAL_COMMUNITY)
Admission: EM | Admit: 2022-02-08 | Discharge: 2022-02-08 | Disposition: A | Discharge status: Home / Self Care | Payer: Medicare Other | Attending: Emergency Medicine | Admitting: Emergency Medicine

## 2022-02-08 DIAGNOSIS — R3915 Urgency of urination: Secondary | ICD-10-CM | POA: Diagnosis not present

## 2022-02-08 DIAGNOSIS — R300 Dysuria: Secondary | ICD-10-CM | POA: Diagnosis not present

## 2022-02-08 DIAGNOSIS — J441 Chronic obstructive pulmonary disease with (acute) exacerbation: Secondary | ICD-10-CM | POA: Diagnosis not present

## 2022-02-08 DIAGNOSIS — R350 Frequency of micturition: Secondary | ICD-10-CM | POA: Diagnosis not present

## 2022-02-08 DIAGNOSIS — Z7952 Long term (current) use of systemic steroids: Secondary | ICD-10-CM | POA: Diagnosis not present

## 2022-02-08 DIAGNOSIS — Z7951 Long term (current) use of inhaled steroids: Secondary | ICD-10-CM | POA: Insufficient documentation

## 2022-02-08 DIAGNOSIS — J449 Chronic obstructive pulmonary disease, unspecified: Secondary | ICD-10-CM | POA: Insufficient documentation

## 2022-02-08 LAB — URINALYSIS, ROUTINE W REFLEX MICROSCOPIC
Bilirubin Urine: NEGATIVE
Glucose, UA: NEGATIVE mg/dL
Hgb urine dipstick: NEGATIVE
Ketones, ur: 5 mg/dL — AB
Nitrite: NEGATIVE
Protein, ur: 30 mg/dL — AB
Specific Gravity, Urine: 1.025 (ref 1.005–1.030)
pH: 5 (ref 5.0–8.0)

## 2022-02-08 MED ORDER — PHENAZOPYRIDINE HCL 200 MG PO TABS: 200.0000 mg | ORAL_TABLET | Freq: Three times a day (TID) | ORAL | 0 refills | Status: DC

## 2022-02-08 MED ORDER — CEPHALEXIN 500 MG PO CAPS: 500.0000 mg | ORAL_CAPSULE | Freq: Four times a day (QID) | ORAL | 0 refills | Status: DC

## 2022-02-08 NOTE — ED Triage Notes (Signed)
Pt presents with c/o burning with urination. Pt reports this started several days ago. Pt denies any other pain. Pt has a strong smell of urine about her. ?

## 2022-02-08 NOTE — Discharge Instructions (Signed)
Please read and follow all provided instructions. ? ?Your diagnoses today include:  ?1. Dysuria   ? ? ?Tests performed today include: ?Urine test - suggests that you have an infection in your bladder ?Vital signs. See below for your results today.  ? ?Medications prescribed:  ?Keflex (cephalexin) - antibiotic ? ?You have been prescribed an antibiotic medicine: take the entire course of medicine even if you are feeling better. Stopping early can cause the antibiotic not to work. ? ?Home care instructions:  ?Follow any educational materials contained in this packet. ? ?Follow-up instructions: ?Please follow-up with your primary care provider in 3 days if symptoms are not resolved for further evaluation of your symptoms. ? ?Return instructions:  ?Please return to the Emergency Department if you experience worsening symptoms.  ?Return with fever, worsening pain, persistent vomiting, worsening pain in your back.  ?Please return if you have any other emergent concerns. ? ?Additional Information: ? ?Your vital signs today were: ?BP (!) 154/64   Pulse (!) 49   Temp 97.7 ?F (36.5 ?C) (Oral)   Resp 17   Ht '5\' 5"'$  (1.651 m)   Wt 52.2 kg   SpO2 100%   BMI 19.14 kg/m?  ?If your blood pressure (BP) was elevated above 135/85 this visit, please have this repeated by your doctor within one month. ?-------------- ? ?

## 2022-02-08 NOTE — ED Provider Notes (Signed)
?[facility] DEPT ?Provider Note ? ? ?CSN: [id number] ?Arrival date & time: 02/08/22  0825 ? ?  ? ?History ? ?Chief Complaint  ?Patient presents with  ? Burning with urination  ? ? ?Brenda Gibson is a 81 y.o. female. ? ?Patient with history of small bowel obstruction, bladder prolapse, COPD --presents to the emergency department today for dysuria.  Patient also reports increased frequency and urgency.  Symptoms have been ongoing for a couple of days.  No associated vomiting, fever, back pain.  She has not noted blood in her urine.  Denies history of diverticulitis.  No treatments prior to arrival. ? ? ?  ? ?Home Medications ?Prior to Admission medications   ?Medication Sig Start Date End Date Taking? Authorizing Provider  ?albuterol (VENTOLIN HFA) 108 (90 Base) MCG/ACT inhaler Inhale 2 puffs into the lungs every 4 (four) hours as needed for wheezing or shortness of breath. 06/01/21   Elodia Florence., MD  ?amLODipine (NORVASC) 5 MG tablet Take 1 tablet (5 mg total) by mouth daily. 12/14/21   Lavina Hamman, MD  ?mometasone-formoterol [facility]) 200-5 MCG/ACT AERO Inhale 2 puffs into the lungs in the morning and at bedtime. 12/13/21   Lavina Hamman, MD  ?predniSONE (DELTASONE) 10 MG tablet Take '40mg'$  daily for 3days,Take '30mg'$  daily for 3days,Take '20mg'$  daily for 3days,Take '10mg'$  daily for 3days, then stop 12/29/21   Sherrill Raring, PA-C  ?tiotropium (SPIRIVA HANDIHALER) 18 MCG inhalation capsule Place 1 capsule (18 mcg total) into inhaler and inhale daily. 06/01/21   Elodia Florence., MD  ?   ? ?Allergies    ?Patient has no known allergies.   ? ?Review of Systems   ?Review of Systems ? ?Physical Exam ?Updated Vital Signs ?BP (!) 147/68   Pulse 64   Temp 97.7 ?F (36.5 ?C) (Oral)   Resp 18   Ht '5\' 5"'$  (1.651 m)   Wt 52.2 kg   SpO2 96%   BMI 19.14 kg/m?  ?Physical Exam ?Vitals and nursing note reviewed.  ?Constitutional:   ?   General: She is not in acute distress. ?   Appearance: She  is well-developed.  ?   Comments: Patient is very thin  ?HENT:  ?   Head: Normocephalic and atraumatic.  ?   Right Ear: External ear normal.  ?   Left Ear: External ear normal.  ?   Nose: Nose normal.  ?Eyes:  ?   Conjunctiva/sclera: Conjunctivae normal.  ?Cardiovascular:  ?   Rate and Rhythm: Normal rate and regular rhythm.  ?   Heart sounds: No murmur heard. ?Pulmonary:  ?   Effort: No respiratory distress.  ?Abdominal:  ?   Palpations: Abdomen is soft.  ?   Tenderness: There is no abdominal tenderness. There is no right CVA tenderness, left CVA tenderness, guarding or rebound.  ?   Comments: Abdomen soft and nontender.  ?Musculoskeletal:  ?   Cervical back: Normal range of motion and neck supple.  ?   Right lower leg: No edema.  ?   Left lower leg: No edema.  ?Skin: ?   General: Skin is warm and dry.  ?   Findings: No rash.  ?Neurological:  ?   General: No focal deficit present.  ?   Mental Status: She is alert. Mental status is at baseline.  ?   Motor: No weakness.  ?Psychiatric:     ?   Mood and Affect: Mood normal.  ? ? ?ED  Results / Procedures / Treatments   ?Labs ?(all labs ordered are listed, but only abnormal results are displayed) ?Labs Reviewed  ?URINALYSIS, ROUTINE W REFLEX MICROSCOPIC - Abnormal; Notable for the following components:  ?    Result Value  ? APPearance HAZY (*)   ? Ketones, ur 5 (*)   ? Protein, ur 30 (*)   ? Leukocytes,Ua LARGE (*)   ? Bacteria, UA RARE (*)   ? All other components within normal limits  ?URINE CULTURE  ? ? ?EKG ?None ? ?Radiology ?No results found. ? ?Procedures ?Procedures  ? ? ?Medications Ordered in ED ?Medications - No data to display ? ?ED Course/ Medical Decision Making/ A&P ?  ?Patient seen and examined. History obtained directly from patient.  ? ?Labs/EKG: Ordered UA.  ? ?Most recent vital signs reviewed and are as follows: ?BP (!) 147/68   Pulse 64   Temp 97.7 ?F (36.5 ?C) (Oral)   Resp 18   Ht '5\' 5"'$  (1.651 m)   Wt 52.2 kg   SpO2 96%   BMI 19.14 kg/m?   ? ?Initial impression: Dysuria, evaluate for UTI.  Low concern for pyelonephritis.  Abdomen is soft and nontender and low concern for diverticulitis or other intra-abdominal etiology.  May need further work-up if urine appears normal. ? ?12:20 PM Reassessment performed. Patient appears very comfortable.  On reexam, abdomen is soft and nontender. ? ?Labs personally reviewed and interpreted including: UA demonstrating large leuk esterase, 6-10 white cells.  ? ?Reviewed pertinent lab work and imaging with patient at bedside. Questions answered.  ? ?Most current vital signs reviewed and are as follows: ?BP (!) 154/64   Pulse (!) 49   Temp 97.7 ?F (36.5 ?C) (Oral)   Resp 17   Ht '5\' 5"'$  (1.651 m)   Wt 52.2 kg   SpO2 100%   BMI 19.14 kg/m?  ? ?Plan: Discharge to home.  ? ?Prescriptions written for: Keflex, Pyridium ? ?ED return instructions discussed: Return if she develops abdominal pain, vomiting, fevers. ? ?Follow-up instructions discussed: Patient encouraged to follow-up with their PCP in 3 days for recheck if not improving.  ? ?                        ?Medical Decision Making ?Amount and/or Complexity of Data Reviewed ?Labs: ordered. ? ? ?Patient here with dysuria without other signs of pyelonephritis.  UA does have some signs of infection.  Patient's abdominal exam is otherwise benign without any tenderness, CVA tenderness.  She has not been vomiting.  Low concern for pyelonephritis, diverticulitis or other significant intra-abdominal etiology at this time.  We will treat for uncomplicated cystitis. ? ?The patient's vital signs, pertinent lab work and imaging were reviewed and interpreted as discussed in the ED course. Hospitalization was considered for further testing, treatments, or serial exams/observation. However as patient is well-appearing, has a stable exam, and reassuring studies today, I do not feel that they warrant admission at this time. This plan was discussed with the patient who verbalizes  agreement and comfort with this plan and seems reliable and able to return to the Emergency Department with worsening or changing symptoms.  ? ? ? ? ? ? ? ? ?Final Clinical Impression(s) / ED Diagnoses ?Final diagnoses:  ?Dysuria  ? ? ?Rx / DC Orders ?ED Discharge Orders   ? ?      Ordered  ?  cephALEXin (KEFLEX) 500 MG capsule  4 times daily       ?  02/08/22 1218  ?  phenazopyridine (PYRIDIUM) 200 MG tablet  3 times daily       ? 02/08/22 1219  ? ?  ?  ? ?  ? ? ?  ?Carlisle Cater, PA-C ?02/08/22 1222 ? ?  ?Daleen Bo, MD ?02/08/22 1726 ? ?

## 2022-02-10 LAB — URINE CULTURE

## 2022-08-16 ENCOUNTER — Telehealth: Payer: Self-pay | Admitting: *Deleted

## 2022-08-16 NOTE — Patient Outreach (Signed)
  Care Coordination   08/16/2022 Name: GENEAN ADAMSKI MRN: [id number] DOB: 20-Nov-1940   Care Coordination Outreach Attempts:  An unsuccessful telephone outreach was attempted today to offer the patient information about available care coordination services as a benefit of their health plan.   Follow Up Plan:  Additional outreach attempts will be made to offer the patient care coordination information and services.   Encounter Outcome:  No Answer  Care Coordination Interventions Activated:  No   Care Coordination Interventions:  No, not indicated    Jacqlyn Larsen [facility], [place] RN Care Coordinator [phone]

## 2022-10-25 ENCOUNTER — Other Ambulatory Visit: Payer: Self-pay

## 2022-10-25 ENCOUNTER — Emergency Department (HOSPITAL_COMMUNITY): Payer: Medicare Other

## 2022-10-25 ENCOUNTER — Inpatient Hospital Stay (HOSPITAL_COMMUNITY)
Admission: EM | Admit: 2022-10-25 | Discharge: 2022-11-08 | DRG: 956 | Disposition: A | Discharge status: Skilled Nursing Facility | Payer: Medicare Other | Attending: Family Medicine | Admitting: Family Medicine

## 2022-10-25 DIAGNOSIS — M79651 Pain in right thigh: Secondary | ICD-10-CM | POA: Diagnosis not present

## 2022-10-25 DIAGNOSIS — E43 Unspecified severe protein-calorie malnutrition: Secondary | ICD-10-CM | POA: Diagnosis present

## 2022-10-25 DIAGNOSIS — I110 Hypertensive heart disease with heart failure: Secondary | ICD-10-CM | POA: Diagnosis present

## 2022-10-25 DIAGNOSIS — K5900 Constipation, unspecified: Secondary | ICD-10-CM | POA: Diagnosis not present

## 2022-10-25 DIAGNOSIS — Z79899 Other long term (current) drug therapy: Secondary | ICD-10-CM | POA: Diagnosis not present

## 2022-10-25 DIAGNOSIS — D72829 Elevated white blood cell count, unspecified: Secondary | ICD-10-CM | POA: Diagnosis present

## 2022-10-25 DIAGNOSIS — F419 Anxiety disorder, unspecified: Secondary | ICD-10-CM | POA: Diagnosis present

## 2022-10-25 DIAGNOSIS — S72001S Fracture of unspecified part of neck of right femur, sequela: Secondary | ICD-10-CM | POA: Diagnosis not present

## 2022-10-25 DIAGNOSIS — S72001A Fracture of unspecified part of neck of right femur, initial encounter for closed fracture: Secondary | ICD-10-CM | POA: Diagnosis present

## 2022-10-25 DIAGNOSIS — R110 Nausea: Secondary | ICD-10-CM | POA: Diagnosis not present

## 2022-10-25 DIAGNOSIS — F17200 Nicotine dependence, unspecified, uncomplicated: Secondary | ICD-10-CM | POA: Diagnosis not present

## 2022-10-25 DIAGNOSIS — Z7401 Bed confinement status: Secondary | ICD-10-CM | POA: Diagnosis not present

## 2022-10-25 DIAGNOSIS — I509 Heart failure, unspecified: Secondary | ICD-10-CM | POA: Diagnosis present

## 2022-10-25 DIAGNOSIS — W010XXA Fall on same level from slipping, tripping and stumbling without subsequent striking against object, initial encounter: Secondary | ICD-10-CM | POA: Diagnosis present

## 2022-10-25 DIAGNOSIS — R140 Abdominal distension (gaseous): Secondary | ICD-10-CM | POA: Diagnosis not present

## 2022-10-25 DIAGNOSIS — E876 Hypokalemia: Secondary | ICD-10-CM | POA: Diagnosis present

## 2022-10-25 DIAGNOSIS — Z681 Body mass index (BMI) 19 or less, adult: Secondary | ICD-10-CM

## 2022-10-25 DIAGNOSIS — F17210 Nicotine dependence, cigarettes, uncomplicated: Secondary | ICD-10-CM | POA: Diagnosis present

## 2022-10-25 DIAGNOSIS — K567 Ileus, unspecified: Secondary | ICD-10-CM | POA: Diagnosis not present

## 2022-10-25 DIAGNOSIS — S72011A Unspecified intracapsular fracture of right femur, initial encounter for closed fracture: Secondary | ICD-10-CM | POA: Diagnosis present

## 2022-10-25 DIAGNOSIS — J90 Pleural effusion, not elsewhere classified: Secondary | ICD-10-CM | POA: Diagnosis not present

## 2022-10-25 DIAGNOSIS — R9431 Abnormal electrocardiogram [ECG] [EKG]: Secondary | ICD-10-CM | POA: Diagnosis not present

## 2022-10-25 DIAGNOSIS — M791 Myalgia, unspecified site: Secondary | ICD-10-CM | POA: Diagnosis present

## 2022-10-25 DIAGNOSIS — K8020 Calculus of gallbladder without cholecystitis without obstruction: Secondary | ICD-10-CM | POA: Diagnosis not present

## 2022-10-25 DIAGNOSIS — R0902 Hypoxemia: Secondary | ICD-10-CM | POA: Diagnosis not present

## 2022-10-25 DIAGNOSIS — R2689 Other abnormalities of gait and mobility: Secondary | ICD-10-CM | POA: Diagnosis not present

## 2022-10-25 DIAGNOSIS — R1311 Dysphagia, oral phase: Secondary | ICD-10-CM | POA: Diagnosis not present

## 2022-10-25 DIAGNOSIS — Z961 Presence of intraocular lens: Secondary | ICD-10-CM | POA: Diagnosis present

## 2022-10-25 DIAGNOSIS — Z7951 Long term (current) use of inhaled steroids: Secondary | ICD-10-CM

## 2022-10-25 DIAGNOSIS — S72002A Fracture of unspecified part of neck of left femur, initial encounter for closed fracture: Secondary | ICD-10-CM | POA: Diagnosis not present

## 2022-10-25 DIAGNOSIS — Z720 Tobacco use: Secondary | ICD-10-CM | POA: Diagnosis present

## 2022-10-25 DIAGNOSIS — K6389 Other specified diseases of intestine: Secondary | ICD-10-CM | POA: Diagnosis not present

## 2022-10-25 DIAGNOSIS — R531 Weakness: Secondary | ICD-10-CM | POA: Diagnosis not present

## 2022-10-25 DIAGNOSIS — Z8616 Personal history of COVID-19: Secondary | ICD-10-CM | POA: Diagnosis not present

## 2022-10-25 DIAGNOSIS — S72141A Displaced intertrochanteric fracture of right femur, initial encounter for closed fracture: Secondary | ICD-10-CM | POA: Diagnosis not present

## 2022-10-25 DIAGNOSIS — Z23 Encounter for immunization: Secondary | ICD-10-CM | POA: Diagnosis not present

## 2022-10-25 DIAGNOSIS — J449 Chronic obstructive pulmonary disease, unspecified: Secondary | ICD-10-CM | POA: Diagnosis not present

## 2022-10-25 DIAGNOSIS — M810 Age-related osteoporosis without current pathological fracture: Secondary | ICD-10-CM | POA: Diagnosis present

## 2022-10-25 DIAGNOSIS — Z85828 Personal history of other malignant neoplasm of skin: Secondary | ICD-10-CM | POA: Diagnosis not present

## 2022-10-25 DIAGNOSIS — R911 Solitary pulmonary nodule: Secondary | ICD-10-CM | POA: Diagnosis present

## 2022-10-25 DIAGNOSIS — S32591A Other specified fracture of right pubis, initial encounter for closed fracture: Secondary | ICD-10-CM | POA: Diagnosis present

## 2022-10-25 DIAGNOSIS — R41841 Cognitive communication deficit: Secondary | ICD-10-CM | POA: Diagnosis not present

## 2022-10-25 DIAGNOSIS — N3289 Other specified disorders of bladder: Secondary | ICD-10-CM | POA: Diagnosis not present

## 2022-10-25 DIAGNOSIS — I10 Essential (primary) hypertension: Secondary | ICD-10-CM | POA: Diagnosis present

## 2022-10-25 DIAGNOSIS — M6281 Muscle weakness (generalized): Secondary | ICD-10-CM | POA: Diagnosis not present

## 2022-10-25 DIAGNOSIS — R64 Cachexia: Secondary | ICD-10-CM | POA: Diagnosis present

## 2022-10-25 DIAGNOSIS — M1611 Unilateral primary osteoarthritis, right hip: Secondary | ICD-10-CM | POA: Diagnosis not present

## 2022-10-25 DIAGNOSIS — R400 Somnolence: Secondary | ICD-10-CM | POA: Diagnosis not present

## 2022-10-25 DIAGNOSIS — J9601 Acute respiratory failure with hypoxia: Secondary | ICD-10-CM | POA: Diagnosis not present

## 2022-10-25 DIAGNOSIS — Z716 Tobacco abuse counseling: Secondary | ICD-10-CM

## 2022-10-25 DIAGNOSIS — Z01818 Encounter for other preprocedural examination: Secondary | ICD-10-CM | POA: Diagnosis not present

## 2022-10-25 DIAGNOSIS — I5189 Other ill-defined heart diseases: Secondary | ICD-10-CM

## 2022-10-25 MED ORDER — IBUPROFEN 200 MG PO TABS
600.0000 mg | ORAL_TABLET | Freq: Once | ORAL | Status: AC
  Administered 2022-10-26: 600 mg via ORAL
  Filled 2022-10-25: qty 3

## 2022-10-25 NOTE — ED Provider Triage Note (Signed)
Emergency Medicine Provider Triage Evaluation Note  Brenda Gibson , a 81 y.o. female  was evaluated in triage.  Pt complains of right hip pain after fall 5 days ago.  She is able to walk on it but having soreness in her right hip.  Denies prior history of hip fractures.  No head injury, no loss of consciousness  Review of Systems  Positive: Myalgia, arthralgia Negative: Numbness or weakness  Physical Exam  BP (!) 157/97 (BP Location: Right Arm)   Pulse 66   Temp 97.8 F (36.6 C) (Oral)   Resp 19   Ht '5\' 5"'$  (1.651 m)   Wt 54.4 kg   SpO2 95%   BMI 19.97 kg/m  Gen:   Awake, no distress   Resp:  Normal effort  MSK:   Pain in right hip with flexion of the hip  Medical Decision Making  Medically screening exam initiated at 10:02 PM.  Appropriate orders placed.  Brenda Gibson was informed that the remainder of the evaluation will be completed by another provider, this initial triage assessment does not replace that evaluation, and the importance of remaining in the ED until their evaluation is complete.  Patient is pending x-ray imaging of the right hip and femur to evaluate for potential fracture.  She is neurovascularly intact.  No head injuries.  She has been able to ambulate on it with a walker at home for the past 5 days.   Wyvonnia Dusky, MD 10/25/22 2203

## 2022-10-25 NOTE — ED Triage Notes (Signed)
Pt via POV c/o right leg and hip pain after falling onto her right side 5 days ago. Pt reports thigh and hip pain. She is ambulatory but says it is difficult to walk. Pain rated 7/10; pt denies additional injury. No meds today PTA

## 2022-10-26 ENCOUNTER — Encounter (HOSPITAL_COMMUNITY): Payer: Self-pay | Admitting: Internal Medicine

## 2022-10-26 ENCOUNTER — Emergency Department (HOSPITAL_COMMUNITY): Payer: Medicare Other

## 2022-10-26 ENCOUNTER — Encounter (HOSPITAL_BASED_OUTPATIENT_CLINIC_OR_DEPARTMENT_OTHER): Payer: Self-pay | Admitting: Anesthesiology

## 2022-10-26 ENCOUNTER — Inpatient Hospital Stay (HOSPITAL_BASED_OUTPATIENT_CLINIC_OR_DEPARTMENT_OTHER): Payer: Self-pay | Admitting: Anesthesiology

## 2022-10-26 DIAGNOSIS — M810 Age-related osteoporosis without current pathological fracture: Secondary | ICD-10-CM | POA: Diagnosis present

## 2022-10-26 DIAGNOSIS — I10 Essential (primary) hypertension: Secondary | ICD-10-CM | POA: Diagnosis not present

## 2022-10-26 DIAGNOSIS — K6389 Other specified diseases of intestine: Secondary | ICD-10-CM | POA: Diagnosis not present

## 2022-10-26 DIAGNOSIS — S32591A Other specified fracture of right pubis, initial encounter for closed fracture: Secondary | ICD-10-CM | POA: Diagnosis present

## 2022-10-26 DIAGNOSIS — W010XXA Fall on same level from slipping, tripping and stumbling without subsequent striking against object, initial encounter: Secondary | ICD-10-CM | POA: Diagnosis present

## 2022-10-26 DIAGNOSIS — D72829 Elevated white blood cell count, unspecified: Secondary | ICD-10-CM | POA: Diagnosis present

## 2022-10-26 DIAGNOSIS — Z85828 Personal history of other malignant neoplasm of skin: Secondary | ICD-10-CM | POA: Diagnosis not present

## 2022-10-26 DIAGNOSIS — J9601 Acute respiratory failure with hypoxia: Secondary | ICD-10-CM | POA: Diagnosis not present

## 2022-10-26 DIAGNOSIS — R2689 Other abnormalities of gait and mobility: Secondary | ICD-10-CM | POA: Diagnosis not present

## 2022-10-26 DIAGNOSIS — J449 Chronic obstructive pulmonary disease, unspecified: Secondary | ICD-10-CM | POA: Diagnosis present

## 2022-10-26 DIAGNOSIS — Z7401 Bed confinement status: Secondary | ICD-10-CM | POA: Diagnosis not present

## 2022-10-26 DIAGNOSIS — R41841 Cognitive communication deficit: Secondary | ICD-10-CM | POA: Diagnosis not present

## 2022-10-26 DIAGNOSIS — K5900 Constipation, unspecified: Secondary | ICD-10-CM | POA: Diagnosis present

## 2022-10-26 DIAGNOSIS — Z7951 Long term (current) use of inhaled steroids: Secondary | ICD-10-CM | POA: Diagnosis not present

## 2022-10-26 DIAGNOSIS — R400 Somnolence: Secondary | ICD-10-CM | POA: Diagnosis not present

## 2022-10-26 DIAGNOSIS — S72011A Unspecified intracapsular fracture of right femur, initial encounter for closed fracture: Secondary | ICD-10-CM | POA: Diagnosis present

## 2022-10-26 DIAGNOSIS — I509 Heart failure, unspecified: Secondary | ICD-10-CM | POA: Diagnosis present

## 2022-10-26 DIAGNOSIS — E43 Unspecified severe protein-calorie malnutrition: Secondary | ICD-10-CM | POA: Diagnosis present

## 2022-10-26 DIAGNOSIS — Z8616 Personal history of COVID-19: Secondary | ICD-10-CM | POA: Diagnosis not present

## 2022-10-26 DIAGNOSIS — F17200 Nicotine dependence, unspecified, uncomplicated: Secondary | ICD-10-CM | POA: Diagnosis not present

## 2022-10-26 DIAGNOSIS — S72002A Fracture of unspecified part of neck of left femur, initial encounter for closed fracture: Secondary | ICD-10-CM | POA: Diagnosis not present

## 2022-10-26 DIAGNOSIS — M6281 Muscle weakness (generalized): Secondary | ICD-10-CM | POA: Diagnosis not present

## 2022-10-26 DIAGNOSIS — I110 Hypertensive heart disease with heart failure: Secondary | ICD-10-CM | POA: Diagnosis present

## 2022-10-26 DIAGNOSIS — F419 Anxiety disorder, unspecified: Secondary | ICD-10-CM | POA: Diagnosis present

## 2022-10-26 DIAGNOSIS — R140 Abdominal distension (gaseous): Secondary | ICD-10-CM | POA: Diagnosis not present

## 2022-10-26 DIAGNOSIS — J90 Pleural effusion, not elsewhere classified: Secondary | ICD-10-CM | POA: Diagnosis present

## 2022-10-26 DIAGNOSIS — M791 Myalgia, unspecified site: Secondary | ICD-10-CM | POA: Diagnosis present

## 2022-10-26 DIAGNOSIS — F17210 Nicotine dependence, cigarettes, uncomplicated: Secondary | ICD-10-CM | POA: Diagnosis present

## 2022-10-26 DIAGNOSIS — Z01818 Encounter for other preprocedural examination: Secondary | ICD-10-CM | POA: Diagnosis not present

## 2022-10-26 DIAGNOSIS — Z961 Presence of intraocular lens: Secondary | ICD-10-CM | POA: Diagnosis present

## 2022-10-26 DIAGNOSIS — S72141A Displaced intertrochanteric fracture of right femur, initial encounter for closed fracture: Secondary | ICD-10-CM | POA: Diagnosis not present

## 2022-10-26 DIAGNOSIS — S72001A Fracture of unspecified part of neck of right femur, initial encounter for closed fracture: Secondary | ICD-10-CM | POA: Diagnosis present

## 2022-10-26 DIAGNOSIS — E876 Hypokalemia: Secondary | ICD-10-CM | POA: Diagnosis present

## 2022-10-26 DIAGNOSIS — R1311 Dysphagia, oral phase: Secondary | ICD-10-CM | POA: Diagnosis not present

## 2022-10-26 DIAGNOSIS — M1611 Unilateral primary osteoarthritis, right hip: Secondary | ICD-10-CM | POA: Diagnosis not present

## 2022-10-26 DIAGNOSIS — N3289 Other specified disorders of bladder: Secondary | ICD-10-CM | POA: Diagnosis not present

## 2022-10-26 DIAGNOSIS — R0902 Hypoxemia: Secondary | ICD-10-CM | POA: Diagnosis not present

## 2022-10-26 DIAGNOSIS — S72001S Fracture of unspecified part of neck of right femur, sequela: Secondary | ICD-10-CM | POA: Diagnosis not present

## 2022-10-26 DIAGNOSIS — I5189 Other ill-defined heart diseases: Secondary | ICD-10-CM

## 2022-10-26 DIAGNOSIS — K567 Ileus, unspecified: Secondary | ICD-10-CM | POA: Diagnosis not present

## 2022-10-26 DIAGNOSIS — Z681 Body mass index (BMI) 19 or less, adult: Secondary | ICD-10-CM | POA: Diagnosis not present

## 2022-10-26 DIAGNOSIS — R911 Solitary pulmonary nodule: Secondary | ICD-10-CM | POA: Diagnosis present

## 2022-10-26 DIAGNOSIS — R9431 Abnormal electrocardiogram [ECG] [EKG]: Secondary | ICD-10-CM | POA: Diagnosis not present

## 2022-10-26 DIAGNOSIS — R110 Nausea: Secondary | ICD-10-CM | POA: Diagnosis not present

## 2022-10-26 DIAGNOSIS — R64 Cachexia: Secondary | ICD-10-CM | POA: Diagnosis present

## 2022-10-26 DIAGNOSIS — Z79899 Other long term (current) drug therapy: Secondary | ICD-10-CM | POA: Diagnosis not present

## 2022-10-26 DIAGNOSIS — R531 Weakness: Secondary | ICD-10-CM | POA: Diagnosis not present

## 2022-10-26 DIAGNOSIS — K8020 Calculus of gallbladder without cholecystitis without obstruction: Secondary | ICD-10-CM | POA: Diagnosis not present

## 2022-10-26 LAB — BASIC METABOLIC PANEL
Anion gap: 8 (ref 5–15)
BUN: 15 mg/dL (ref 8–23)
CO2: 32 mmol/L (ref 22–32)
Calcium: 9.1 mg/dL (ref 8.9–10.3)
Chloride: 102 mmol/L (ref 98–111)
Creatinine, Ser: 0.62 mg/dL (ref 0.44–1.00)
GFR, Estimated: >60 mL/min (ref >60)
Glucose, Bld: 96 mg/dL (ref 70–99)
Potassium: 3.5 mmol/L (ref 3.5–5.1)
Sodium: 142 mmol/L (ref 135–145)

## 2022-10-26 LAB — CBC WITH DIFFERENTIAL/PLATELET
Abs Immature Granulocytes: 0.03 10*3/uL (ref 0.00–0.07)
Basophils Absolute: 0.1 10*3/uL (ref 0.0–0.1)
Basophils Relative: 1 %
Eosinophils Absolute: 0 10*3/uL (ref 0.0–0.5)
Eosinophils Relative: 0 %
HCT: 39.4 % (ref 36.0–46.0)
Hemoglobin: 11.7 g/dL — ABNORMAL LOW (ref 12.0–15.0)
Immature Granulocytes: 0 %
Lymphocytes Relative: 18 %
Lymphs Abs: 1.5 10*3/uL (ref 0.7–4.0)
MCH: 26.1 pg (ref 26.0–34.0)
MCHC: 29.7 g/dL — ABNORMAL LOW (ref 30.0–36.0)
MCV: 87.9 fL (ref 80.0–100.0)
Monocytes Absolute: 1 10*3/uL (ref 0.1–1.0)
Monocytes Relative: 11 %
Neutro Abs: 6.2 10*3/uL (ref 1.7–7.7)
Neutrophils Relative %: 70 %
Platelets: 317 10*3/uL (ref 150–400)
RBC: 4.48 MIL/uL (ref 3.87–5.11)
RDW: 14.7 % (ref 11.5–15.5)
WBC: 8.8 10*3/uL (ref 4.0–10.5)
nRBC: 0 % (ref 0.0–0.2)

## 2022-10-26 LAB — TROPONIN I (HIGH SENSITIVITY)
Troponin I (High Sensitivity): 11 ng/L (ref <18)
Troponin I (High Sensitivity): 13 ng/L (ref <18)

## 2022-10-26 LAB — SURGICAL PCR SCREEN
MRSA, PCR: NEGATIVE
Staphylococcus aureus: NEGATIVE

## 2022-10-26 LAB — HEPATIC FUNCTION PANEL
ALT: 14 U/L (ref 0–44)
AST: 15 U/L (ref 15–41)
Albumin: 3.5 g/dL (ref 3.5–5.0)
Alkaline Phosphatase: 74 U/L (ref 38–126)
Bilirubin, Direct: 0.1 mg/dL (ref 0.0–0.2)
Indirect Bilirubin: 0.6 mg/dL (ref 0.3–0.9)
Total Bilirubin: 0.7 mg/dL (ref 0.3–1.2)
Total Protein: 6.6 g/dL (ref 6.5–8.1)

## 2022-10-26 LAB — MAGNESIUM: Magnesium: 2 mg/dL (ref 1.7–2.4)

## 2022-10-26 LAB — PROTIME-INR
INR: 1 (ref 0.8–1.2)
Prothrombin Time: 13.3 seconds (ref 11.4–15.2)

## 2022-10-26 MED ORDER — ALBUTEROL SULFATE (2.5 MG/3ML) 0.083% IN NEBU: 3.0000 mL | INHALATION_SOLUTION | RESPIRATORY_TRACT | Status: DC | PRN

## 2022-10-26 MED ORDER — ONDANSETRON HCL 4 MG/2ML IJ SOLN
4.0000 mg | Freq: Four times a day (QID) | INTRAMUSCULAR | Status: DC | PRN
  Administered 2022-10-29 – 2022-10-31 (×3): 4 mg via INTRAVENOUS
  Filled 2022-10-26 (×3): qty 2

## 2022-10-26 MED ORDER — ACETAMINOPHEN 325 MG PO TABS
650.0000 mg | ORAL_TABLET | Freq: Four times a day (QID) | ORAL | Status: DC | PRN
  Administered 2022-10-28 – 2022-11-07 (×8): 650 mg via ORAL
  Filled 2022-10-26 (×9): qty 2

## 2022-10-26 MED ORDER — MAGNESIUM OXIDE -MG SUPPLEMENT 400 (240 MG) MG PO TABS
400.0000 mg | ORAL_TABLET | Freq: Two times a day (BID) | ORAL | Status: DC
  Administered 2022-10-26 – 2022-10-30 (×8): 400 mg via ORAL
  Filled 2022-10-26 (×8): qty 1

## 2022-10-26 MED ORDER — SENNOSIDES-DOCUSATE SODIUM 8.6-50 MG PO TABS
1.0000 | ORAL_TABLET | Freq: Two times a day (BID) | ORAL | Status: DC
  Administered 2022-10-26 – 2022-10-29 (×7): 1 via ORAL
  Filled 2022-10-26 (×7): qty 1

## 2022-10-26 MED ORDER — ONDANSETRON HCL 4 MG PO TABS
4.0000 mg | ORAL_TABLET | Freq: Four times a day (QID) | ORAL | Status: DC | PRN
  Administered 2022-10-30: 4 mg via ORAL
  Filled 2022-10-26: qty 1

## 2022-10-26 MED ORDER — NICOTINE 14 MG/24HR TD PT24
14.0000 mg | MEDICATED_PATCH | Freq: Every day | TRANSDERMAL | Status: DC | PRN
  Administered 2022-10-26: 14 mg via TRANSDERMAL
  Filled 2022-10-26: qty 1

## 2022-10-26 MED ORDER — ROPIVACAINE HCL 5 MG/ML IJ SOLN
INTRAMUSCULAR | Status: DC | PRN
  Administered 2022-10-26: 25 mL via PERINEURAL

## 2022-10-26 MED ORDER — OXYCODONE HCL 5 MG PO TABS
5.0000 mg | ORAL_TABLET | ORAL | Status: DC | PRN
  Administered 2022-10-29 (×2): 5 mg via ORAL
  Filled 2022-10-26 (×2): qty 1

## 2022-10-26 MED ORDER — DEXAMETHASONE SODIUM PHOSPHATE 10 MG/ML IJ SOLN
INTRAMUSCULAR | Status: DC | PRN
  Administered 2022-10-26: 5 mg

## 2022-10-26 MED ORDER — FENTANYL CITRATE PF 50 MCG/ML IJ SOSY
PREFILLED_SYRINGE | INTRAMUSCULAR | Status: AC
  Administered 2022-10-26: 50 ug via INTRAVENOUS
  Filled 2022-10-26: qty 2

## 2022-10-26 MED ORDER — ACETAMINOPHEN 650 MG RE SUPP: 650.0000 mg | Freq: Four times a day (QID) | RECTAL | Status: DC | PRN

## 2022-10-26 MED ORDER — AMLODIPINE BESYLATE 5 MG PO TABS
5.0000 mg | ORAL_TABLET | Freq: Every day | ORAL | Status: DC
  Administered 2022-10-26 – 2022-10-29 (×3): 5 mg via ORAL
  Filled 2022-10-26 (×3): qty 1

## 2022-10-26 MED ORDER — FENTANYL CITRATE PF 50 MCG/ML IJ SOSY: 50.0000 ug | PREFILLED_SYRINGE | Freq: Once | INTRAMUSCULAR | Status: AC

## 2022-10-26 MED ORDER — HYDROMORPHONE HCL 1 MG/ML IJ SOLN
0.5000 mg | INTRAMUSCULAR | Status: DC | PRN
  Administered 2022-10-26 – 2022-10-28 (×4): 0.5 mg via INTRAVENOUS
  Filled 2022-10-26 (×4): qty 0.5

## 2022-10-26 MED ORDER — FENTANYL CITRATE PF 50 MCG/ML IJ SOSY
50.0000 ug | PREFILLED_SYRINGE | Freq: Once | INTRAMUSCULAR | Status: AC
Start: 2022-10-26 — End: 2022-10-26
  Administered 2022-10-26: 50 ug via INTRAVENOUS
  Filled 2022-10-26: qty 1

## 2022-10-26 NOTE — Anesthesia Preprocedure Evaluation (Addendum)
Anesthesia Evaluation  Patient identified by MRN, date of birth, ID band Patient awake    Reviewed: Allergy & Precautions, NPO status , Patient's Chart, lab work & pertinent test results  History of Anesthesia Complications Negative for: history of anesthetic complications  Airway Mallampati: I  TM Distance: >3 FB Neck ROM: Full    Dental  (+) Edentulous Upper, Edentulous Lower, Dental Advisory Given   Pulmonary COPD, Current Smoker   Pulmonary exam normal        Cardiovascular hypertension, Pt. on medications Normal cardiovascular exam     Neuro/Psych negative neurological ROS     GI/Hepatic negative GI ROS, Neg liver ROS,,,  Endo/Other  negative endocrine ROS    Renal/GU negative Renal ROS     Musculoskeletal   Abdominal   Peds  Hematology negative hematology ROS (+)   Anesthesia Other Findings   Reproductive/Obstetrics                             Anesthesia Physical Anesthesia Plan  ASA: 3  Anesthesia Plan: MAC   Post-op Pain Management:    Induction:   PONV Risk Score and Plan: 1  Airway Management Planned: Natural Airway and Simple Face Mask  Additional Equipment:   Intra-op Plan:   Post-operative Plan:   Informed Consent: I have reviewed the patients History and Physical, chart, labs and discussed the procedure including the risks, benefits and alternatives for the proposed anesthesia with the patient or authorized representative who has indicated his/her understanding and acceptance.     Dental advisory given  Plan Discussed with: Anesthesiologist  Anesthesia Plan Comments:        Anesthesia Quick Evaluation

## 2022-10-26 NOTE — H&P (Signed)
History and Physical    Patient: Brenda Gibson JKD:[id number] DOB: October 12, 1941 DOA: 10/25/2022 DOS: the patient was seen and examined on 10/26/2022 PCP: Pcp, No  Patient coming from: Home  Chief Complaint:  Chief Complaint  Patient presents with   Leg Pain   HPI: Brenda Gibson is a 81 y.o. female with medical history significant of basal cell carcinoma of the nose, seborrheic dermatitis of the scalp, cataracts, COVID-19 infection, measles, varicella zoster, hypertension, hyperglycemia, bilateral knee pain, myalgias, osteoporosis, tobacco abuse who was brought to the emergency department due to right hip and right thigh pain after having a fall at home 5 days ago.  She has been walking, but stated is very painful to do so.  She denied any prodromal symptoms.  She stated she tripped herself and fell on concrete hitting her right hip area.  No fever, chills or night sweats. No sore throat, rhinorrhea, dyspnea, wheezing or hemoptysis.  No chest pain, palpitations, diaphoresis, PND, orthopnea or pitting edema of the lower extremities.  No appetite changes, abdominal pain, diarrhea, melena or hematochezia.  She gets frequently constipated.  No flank pain, dysuria, frequency or hematuria.  No polyuria, polydipsia, polyphagia or blurred vision.  ED course: Initial vital signs were temperature 97.8 F, pulse 66, respiration 19, BP 157/97 mmHg O2 sat 95% on room air.  The patient received fentanyl 50 mcg IVP and ibuprofen 600 mg p.o.  Lab work: CBC showed a white count of 8.8, hemoglobin 11.7 g/dL platelets 317.  Normal PT and INR.  Normal BNP.  Imaging: Right femur and hip x-ray were suspicious for nondisplaced subcapital femoral neck fracture.  This was confirmed by CT of the hip which showed a mildly impacted subcapital proximal right femoral neck fracture, which may be incomplete as it is better seen along the superior cortex of the bone thinning inferiorly.  Nondisplaced longitudinal oblique  fracture of the right pubic bone and displaced oblique fracture of the right superior pubic ramus.  Positive osteopenia and degenerative change.  Calcific atherosclerosis.  Please see images and full regular report for further details.   Review of Systems: As mentioned in the history of present illness. All other systems reviewed and are negative. Past Medical History:  Diagnosis Date   Basal cell carcinoma of ala nasi    Cataract    COVID-19 virus infection 05/22/2021   Essential hypertension, benign 06/05/2013   Mild, progressive hypertension. Isolated systolic hypertension.   Serial BPs:  --08/02/13: 130/80, recent measurements from home range from 137/73 to 149/75 with higher measurement being when skipping furosemide due to overactive bladder symptoms --06/05/13: 150/86 --02/22/13: 118/60   Hyperglycemia 05/22/2021   Hypertension    Knee pain, bilateral    Measles    Myalgia    Osteoporosis 08/12/2014   Seborrheic dermatitis of scalp    Small bowel obstruction ([place])    Tobacco abuse 02/24/2011   Varicella    Past Surgical History:  Procedure Laterality Date   BASAL CELL CARCINOMA EXCISION     '11 ([place])   CATARACT EXTRACTION W/ INTRAOCULAR LENS IMPLANT     right eye. '09 (Dr. Dolores Lory)   LAPAROTOMY N/A 06/25/2016   Procedure: EXPLORATORY LAPAROTOMY, lysis of adhesion;  Surgeon: Coralie Keens, MD;  Location: [facility] ORS;  Service: General;  Laterality: N/A;   LYSIS OF ADHESION  06/25/2016   Dr Nedra Hai   Social History:  reports that she has been smoking cigarettes. She has a 31.20 pack-year smoking history. She has never  used smokeless tobacco. She reports current alcohol use. She reports that she does not use drugs.  No Known Allergies  Family History  Problem Relation Age of Onset   Alcohol abuse Mother    Cancer Father 43       stomach cancer   Prostate cancer Father    Hypertension Maternal Grandfather    Diabetes Neg Hx    Heart disease Neg Hx     Prior to Admission  medications   Medication Sig Start Date End Date Taking? Authorizing Provider  albuterol (VENTOLIN HFA) 108 (90 Base) MCG/ACT inhaler Inhale 2 puffs into the lungs every 4 (four) hours as needed for wheezing or shortness of breath. 06/01/21   Elodia Florence., MD  amLODipine (NORVASC) 5 MG tablet Take 1 tablet (5 mg total) by mouth daily. 12/14/21   Lavina Hamman, MD  cephALEXin (KEFLEX) 500 MG capsule Take 1 capsule (500 mg total) by mouth 4 (four) times daily. 02/08/22   Carlisle Cater, PA-C  mometasone-formoterol (DULERA) 200-5 MCG/ACT AERO Inhale 2 puffs into the lungs in the morning and at bedtime. 12/13/21   Lavina Hamman, MD  phenazopyridine (PYRIDIUM) 200 MG tablet Take 1 tablet (200 mg total) by mouth 3 (three) times daily. 02/08/22   Carlisle Cater, PA-C  predniSONE (DELTASONE) 10 MG tablet Take '40mg'$  daily for 3days,Take '30mg'$  daily for 3days,Take '20mg'$  daily for 3days,Take '10mg'$  daily for 3days, then stop 12/29/21   Sherrill Raring, PA-C  tiotropium (SPIRIVA HANDIHALER) 18 MCG inhalation capsule Place 1 capsule (18 mcg total) into inhaler and inhale daily. 06/01/21   Elodia Florence., MD    Physical Exam: Vitals:   10/26/22 0211 10/26/22 0810 10/26/22 0832 10/26/22 0945  BP: (!) 152/84 (!) 140/76 (!) 174/86 (!) 149/79  Pulse: 67 71 64 73  Resp: '19 16 16 18  '$ Temp: 98.4 F (36.9 C) 98.6 F (37 C) 98.7 F (37.1 C)   TempSrc: Oral Oral Oral   SpO2: 90% 91% 96% 91%  Weight:      Height:       Physical Exam Vitals reviewed.  Constitutional:      General: She is awake. She is not in acute distress. HENT:     Head: Normocephalic.     Nose: No rhinorrhea.     Mouth/Throat:     Mouth: Mucous membranes are moist.  Eyes:     General: No scleral icterus.    Pupils: Pupils are equal, round, and reactive to light.  Neck:     Vascular: No JVD.  Cardiovascular:     Rate and Rhythm: Normal rate and regular rhythm.     Heart sounds: S1 normal and S2 normal.  Pulmonary:     Effort:  Pulmonary effort is normal.     Breath sounds: Normal breath sounds. No wheezing, rhonchi or rales.  Abdominal:     General: Bowel sounds are normal. There is no distension.     Palpations: Abdomen is soft.     Tenderness: There is no abdominal tenderness.  Musculoskeletal:     Cervical back: Neck supple.     Right hip: Tenderness present. Decreased range of motion.     Right lower leg: No edema.     Left lower leg: No edema.  Skin:    General: Skin is warm and dry.  Neurological:     General: No focal deficit present.     Mental Status: She is alert and oriented to person, place, and  time.  Psychiatric:        Mood and Affect: Mood normal.        Behavior: Behavior normal. Behavior is cooperative.    Data Reviewed:  Results are pending, will review when available.  05/27/2021 echocardiogram IMPRESSIONS:   1. Left ventricular ejection fraction, by estimation, is 60 to 65%. The  left ventricle has normal function. The left ventricle has no regional  wall motion abnormalities. There is moderate asymmetric left ventricular  hypertrophy of the basal-septal  segment. Left ventricular diastolic parameters are consistent with Grade I  diastolic dysfunction (impaired relaxation).   2. Right ventricular systolic function is normal. The right ventricular  size is normal. Tricuspid regurgitation signal is inadequate for assessing  PA pressure.   3. The mitral valve is normal in structure. Mild mitral valve  regurgitation.   4. The aortic valve was not well visualized. Aortic valve regurgitation  is not visualized. No aortic stenosis is present.   5. The inferior vena cava is normal in size with greater than 50%  respiratory variability, suggesting right atrial pressure of 3 mmHg.   EKG: Vent. rate 74 BPM PR interval 146 ms QRS duration 98 ms QT/QTcB 409/454 ms P-R-T axes 85 82 165 Sinus rhythm Biatrial enlargement Consider right ventricular hypertrophy LVH with secondary  repolarization abnormality  Assessment and Plan: Principal Problem:   Closed right hip fracture ([place]) Admit to telemetry/inpatient. Ice area as needed. Buck's traction per protocol. Analgesics as needed. Antiemetics as needed. Consult anesthesia for nerve block. Consult TOC team. Consult nutritional services. PT evaluation after surgery. Orthopedic surgery has been consulted  Active Problems:   Pubic ramus fracture, right, closed ([place])  Continue analgesics as needed. Will follow orthopedic surgery recommendations.    Grade I diastolic dysfunction  Abnormal EKG, but no acute changes. Check echocardiogram.    Tobacco abuse Smoking cessation advised. Nicotine replacement therapy as needed.    Essential hypertension, benign Continue amlodipine 5 mg p.o. daily.    Osteoporosis Calcium and vitamin D supplementation. Follow-up with primary care provider. Smoking cessation.    COPD (chronic obstructive pulmonary disease) ([place]) Supplemental oxygen as needed. Bronchodilators as needed. Smoking cessation.    Constipation Will try to preventive with: Stool softeners twice daily. Magnesium oxide 400 mg p.o. twice daily.      Advance Care Planning:   Code Status: Full code.   Consults: Orthopedic surgery (Dr. Ileene Rubens).  Family Communication:   Severity of Illness: The appropriate patient status for this patient is INPATIENT. Inpatient status is judged to be reasonable and necessary in order to provide the required intensity of service to ensure the patient's safety. The patient's presenting symptoms, physical exam findings, and initial radiographic and laboratory data in the context of their chronic comorbidities is felt to place them at high risk for further clinical deterioration. Furthermore, it is not anticipated that the patient will be medically stable for discharge from the hospital within 2 midnights of admission.   * I certify that at the point of admission  it is my clinical judgment that the patient will require inpatient hospital care spanning beyond 2 midnights from the point of admission due to high intensity of service, high risk for further deterioration and high frequency of surveillance required.*  Author: Reubin Milan, MD 10/26/2022 10:24 AM  For on call review [email or web address].   This document was prepared using Dragon voice recognition software and may contain some unintended transcription errors.

## 2022-10-26 NOTE — ED Provider Notes (Signed)
[place] DEPT Provider Note   CSN: [id number] Arrival date & time: 10/25/22  2025     History  Chief Complaint  Patient presents with   Leg Pain    Brenda Gibson is a 81 y.o. female.  Patient complains of right hip pain after falling several days ago.  Her son states the fall was 2 days ago and she states the fall was 5 days ago.  States she tripped and stumbled landed on her right hip on concrete outside.  Denies hitting head or losing consciousness.  Complains of pain to right hip and pelvic area with difficulty walking.  No weakness, numbness or tingling.  Denies any head, neck, back, chest or abdominal pain.  No blood thinner use. She denies any chronic medical conditions or regular medications.  She has been trying to ambulate on the hip with a cane and walker at home but does not usually use anything for walking assistance.  Pain starts in her right groin and radiates down her right leg.  There is no numbness or tingling.  The history is provided by the patient and a relative.  Leg Pain Associated symptoms: no back pain and no fever        Home Medications Prior to Admission medications   Medication Sig Start Date End Date Taking? Authorizing Provider  albuterol (VENTOLIN HFA) 108 (90 Base) MCG/ACT inhaler Inhale 2 puffs into the lungs every 4 (four) hours as needed for wheezing or shortness of breath. 06/01/21   Elodia Florence., MD  amLODipine (NORVASC) 5 MG tablet Take 1 tablet (5 mg total) by mouth daily. 12/14/21   Lavina Hamman, MD  cephALEXin (KEFLEX) 500 MG capsule Take 1 capsule (500 mg total) by mouth 4 (four) times daily. 02/08/22   Carlisle Cater, PA-C  mometasone-formoterol (DULERA) 200-5 MCG/ACT AERO Inhale 2 puffs into the lungs in the morning and at bedtime. 12/13/21   Lavina Hamman, MD  phenazopyridine (PYRIDIUM) 200 MG tablet Take 1 tablet (200 mg total) by mouth 3 (three) times daily. 02/08/22   Carlisle Cater, PA-C   predniSONE (DELTASONE) 10 MG tablet Take '40mg'$  daily for 3days,Take '30mg'$  daily for 3days,Take '20mg'$  daily for 3days,Take '10mg'$  daily for 3days, then stop 12/29/21   Sherrill Raring, PA-C  tiotropium (SPIRIVA HANDIHALER) 18 MCG inhalation capsule Place 1 capsule (18 mcg total) into inhaler and inhale daily. 06/01/21   Elodia Florence., MD      Allergies    Patient has no known allergies.    Review of Systems   Review of Systems  Constitutional:  Negative for activity change, appetite change and fever.  HENT:  Negative for congestion.   Respiratory:  Negative for cough, chest tightness and shortness of breath.   Cardiovascular:  Negative for chest pain.  Gastrointestinal:  Negative for abdominal pain, nausea and vomiting.  Genitourinary:  Negative for dysuria.  Musculoskeletal:  Positive for arthralgias and myalgias. Negative for back pain.  Neurological:  Negative for weakness and headaches.   all other systems are negative except as noted in the HPI and PMH.    Physical Exam Updated Vital Signs BP (!) 174/86 (BP Location: Left Arm)   Pulse 64   Temp 98.7 F (37.1 C) (Oral)   Resp 16   Ht '5\' 5"'$  (1.651 m)   Wt 54.4 kg   SpO2 96%   BMI 19.97 kg/m  Physical Exam Vitals and nursing note reviewed.  Constitutional:  General: She is not in acute distress.    Appearance: She is well-developed.  HENT:     Head: Normocephalic and atraumatic.     Mouth/Throat:     Pharynx: No oropharyngeal exudate.  Eyes:     Conjunctiva/sclera: Conjunctivae normal.     Pupils: Pupils are equal, round, and reactive to light.  Neck:     Comments: No meningismus. Cardiovascular:     Rate and Rhythm: Normal rate and regular rhythm.     Heart sounds: Normal heart sounds. No murmur heard. Pulmonary:     Effort: Pulmonary effort is normal. No respiratory distress.     Breath sounds: Normal breath sounds.  Abdominal:     Palpations: Abdomen is soft.     Tenderness: There is no abdominal  tenderness. There is no guarding or rebound.  Musculoskeletal:        General: Swelling and tenderness present.     Cervical back: Normal range of motion and neck supple.     Right lower leg: Edema present.     Left lower leg: Edema present.     Comments: Pain with ROM of R hip. No shortening or external rotation.  Intact DP pulses.   Skin:    General: Skin is warm.  Neurological:     Mental Status: She is alert and oriented to person, place, and time.     Cranial Nerves: No cranial nerve deficit.     Motor: No abnormal muscle tone.     Coordination: Coordination normal.     Comments:  5/5 strength throughout. CN 2-12 intact.Equal grip strength.   Psychiatric:        Behavior: Behavior normal.     ED Results / Procedures / Treatments   Labs (all labs ordered are listed, but only abnormal results are displayed) Labs Reviewed  CBC WITH DIFFERENTIAL/PLATELET - Abnormal; Notable for the following components:      Result Value   Hemoglobin 11.7 (*)    MCHC 29.7 (*)    All other components within normal limits  BASIC METABOLIC PANEL  PROTIME-INR  MAGNESIUM  HEPATIC FUNCTION PANEL  TROPONIN I (HIGH SENSITIVITY)    EKG EKG Interpretation  Date/Time:  Tuesday October 26 2022 09:14:53 EST Ventricular Rate:  74 PR Interval:  146 QRS Duration: 98 QT Interval:  409 QTC Calculation: 454 R Axis:   82 Text Interpretation: Sinus rhythm Biatrial enlargement Consider right ventricular hypertrophy LVH with secondary repolarization abnormality Nonspecific T wave abnormality Confirmed by Ezequiel Essex [phone]) on 10/26/2022 9:16:30 AM  Radiology CT Hip Right Wo Contrast  Result Date: 10/26/2022 CLINICAL DATA:  Right hip injury with equivocal x-rays for possible fracture. EXAM: CT OF THE RIGHT HIP WITHOUT CONTRAST TECHNIQUE: Multidetector CT imaging of the right hip was performed according to the standard protocol. Multiplanar CT image reconstructions were also generated. RADIATION  DOSE REDUCTION: This exam was performed according to the departmental dose-optimization program which includes automated exposure control, adjustment of the mA and/or kV according to patient size and/or use of iterative reconstruction technique. COMPARISON:  AP pelvis and right hip plain films from yesterday. FINDINGS: Bones/Joint/Cartilage Osteopenia. There is acute mildly impacted subcapital proximal right femoral neck fracture, which is otherwise nondisplaced. The fracture may be incomplete as it is better seen along superior cortex of the bone than inferiorly. Also noted is a longitudinal oblique nondisplaced right pubic bone fracture and nondisplaced oblique fracture in the right superior pubic ramus. There is background moderately advanced degenerative arthrosis of the  right hip, spurring changes at the pubic symphysis. Degenerative subcortical cystic changes are noted of the anterior column of right acetabulum, and small accessory ossicles or nondisplaced chronic chip fractures are noted of the anterior lip of the acetabulum and the posterolateral edge of it. There is no joint effusion. Ligaments Suboptimally assessed by CT. Muscles and Tendons No acute abnormality within the limits of noncontrast CT technique. Soft tissues There is subcutaneous stranding in the lateral right hip region most likely traumatic etiology. There is moderate iliofemoral calcific arteriosclerosis. No pelvic sidewall hematoma is seen, no pelvic adenopathy or mass. There is no free fluid in the right hemipelvis. No incarcerated hernia. IMPRESSION: 1. Acute mildly impacted subcapital proximal right femoral neck fracture, which may be incomplete as it is better seen along the superior cortex of the bone than inferiorly. 2. Nondisplaced longitudinal oblique fracture of the right pubic bone and nondisplaced oblique fracture of the right superior pubic ramus. 3. Osteopenia and degenerative change. 4. Calcific arteriosclerosis.  Electronically Signed   By: Telford Nab M.D.   On: 10/26/2022 05:53   DG Hip Unilat W or Wo Pelvis 2-3 Views Right  Result Date: 10/25/2022 CLINICAL DATA:  Right leg and hip pain after fall EXAM: DG HIP (WITH OR WITHOUT PELVIS) 2-3V RIGHT COMPARISON:  None Available. FINDINGS: Demineralization. Irregularity about the subcapital right femoral head neck junction suspicious for nondisplaced fracture. Moderate to advanced arthritis right hip and advanced degenerative arthritis left hip. IMPRESSION: Irregularity about the subcapital right femoral head neck junction suspicious for nondisplaced fracture. Consider CT for confirmation. Electronically Signed   By: Placido Sou M.D.   On: 10/25/2022 22:44   DG FEMUR PORT, 1V RIGHT  Result Date: 10/25/2022 CLINICAL DATA:  Right leg and hip pain after fall EXAM: RIGHT FEMUR PORTABLE 1 VIEW COMPARISON:  CT abdomen and pelvis 05/24/2021 FINDINGS: Demineralization. Irregularity about the femoral head neck junction is suspicious for subcapital fracture. 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 IMPRESSION: Findings suspicious for nondisplaced subcapital femoral neck fracture. Consider CT for confirmation. Electronically Signed   By: Placido Sou M.D.   On: 10/25/2022 22:43    Procedures Procedures    Medications Ordered in ED Medications  fentaNYL (SUBLIMAZE) injection 50 mcg (has no administration in time range)  ibuprofen (ADVIL) tablet 600 mg (600 mg Oral Given 10/26/22 0831)    ED Course/ Medical Decision Making/ A&P                           Medical Decision Making Amount and/or Complexity of Data Reviewed Labs: ordered. Decision-making details documented in ED Course. Radiology: ordered and independent interpretation performed. Decision-making details documented in ED Course. ECG/medicine tests: ordered and independent interpretation performed. Decision-making details documented in ED Course.  Risk Prescription drug  management. Decision regarding hospitalization.   R hip pain after falling several days ago. No head injury. Neurovascularly intact.   Imaging remarkable for R subcapital fracture, nondisplaced pelvic fracture.  D/w nurse for Dr. Laurance Flatten who is in [place].  Plan admission to the hospitalist service for likely surgical repair of subcapital hip fracture.  EKG with nonspecific T wave inversions. No comparison. Denies SOB and CP.   Screening labs reassuring.  Admission d/w Dr. Olevia Bowens.          Final Clinical Impression(s) / ED Diagnoses Final diagnoses:  Closed right hip fracture, initial encounter [facility])    Rx / DC Orders ED Discharge Orders  None         Ezequiel Essex, MD 10/26/22 1032

## 2022-10-26 NOTE — Anesthesia Procedure Notes (Signed)
Anesthesia Regional Block: Femoral nerve block   Pre-Anesthetic Checklist: , timeout performed,  Correct Patient, Correct Site, Correct Laterality,  Correct Procedure, Correct Position, site marked,  Risks and benefits discussed,  Surgical consent,  Pre-op evaluation,  At surgeon's request and post-op pain management  Laterality: Right  Prep: chloraprep       Needles:  Injection technique: Single-shot  Needle Type: Echogenic Stimulator Needle     Needle Length: 5cm  Needle Gauge: 22     Additional Needles:   Procedures:, nerve stimulator,,, ultrasound used (permanent image in chart),,     Nerve Stimulator or Paresthesia:  Response: quadraceps contraction, 0.45 mA  Additional Responses:   Narrative:  Start time: 10/26/2022 4:41 PM End time: 10/26/2022 4:01 PM Injection made incrementally with aspirations every 5 mL.  Performed by: Personally  Anesthesiologist: Duane Boston, MD  Additional Notes: Functioning IV was confirmed and monitors were applied.  A 79m 22ga Arrow echogenic stimulator needle was used. Sterile prep and drape,hand hygiene and sterile gloves were used. Ultrasound guidance: relevant anatomy identified, needle position confirmed, local anesthetic spread visualized around nerve(s)., vascular puncture avoided.  Image printed for medical record. Negative aspiration and negative test dose prior to incremental administration of local anesthetic. The patient tolerated the procedure well.

## 2022-10-27 ENCOUNTER — Inpatient Hospital Stay (HOSPITAL_COMMUNITY): Payer: Medicare Other

## 2022-10-27 ENCOUNTER — Encounter (HOSPITAL_COMMUNITY): Payer: Self-pay | Admitting: Internal Medicine

## 2022-10-27 ENCOUNTER — Inpatient Hospital Stay (HOSPITAL_COMMUNITY): Payer: Medicare Other | Admitting: Anesthesiology

## 2022-10-27 ENCOUNTER — Other Ambulatory Visit: Payer: Self-pay

## 2022-10-27 ENCOUNTER — Encounter (HOSPITAL_COMMUNITY)
Admission: EM | Disposition: A | Discharge status: Skilled Nursing Facility | Payer: Self-pay | Source: Home / Self Care | Attending: Family Medicine

## 2022-10-27 DIAGNOSIS — F17210 Nicotine dependence, cigarettes, uncomplicated: Secondary | ICD-10-CM | POA: Diagnosis not present

## 2022-10-27 DIAGNOSIS — I10 Essential (primary) hypertension: Secondary | ICD-10-CM | POA: Diagnosis not present

## 2022-10-27 DIAGNOSIS — S72002A Fracture of unspecified part of neck of left femur, initial encounter for closed fracture: Secondary | ICD-10-CM | POA: Diagnosis not present

## 2022-10-27 DIAGNOSIS — S72001A Fracture of unspecified part of neck of right femur, initial encounter for closed fracture: Secondary | ICD-10-CM | POA: Diagnosis not present

## 2022-10-27 DIAGNOSIS — R9431 Abnormal electrocardiogram [ECG] [EKG]: Secondary | ICD-10-CM

## 2022-10-27 DIAGNOSIS — J449 Chronic obstructive pulmonary disease, unspecified: Secondary | ICD-10-CM | POA: Diagnosis not present

## 2022-10-27 HISTORY — PX: INTRAMEDULLARY (IM) NAIL INTERTROCHANTERIC: SHX5875

## 2022-10-27 LAB — CBC
HCT: 34.9 % — ABNORMAL LOW (ref 36.0–46.0)
Hemoglobin: 10.8 g/dL — ABNORMAL LOW (ref 12.0–15.0)
MCH: 27 pg (ref 26.0–34.0)
MCHC: 30.9 g/dL (ref 30.0–36.0)
MCV: 87.3 fL (ref 80.0–100.0)
Platelets: 285 10*3/uL (ref 150–400)
RBC: 4 MIL/uL (ref 3.87–5.11)
RDW: 14.4 % (ref 11.5–15.5)
WBC: 5.4 10*3/uL (ref 4.0–10.5)
nRBC: 0 % (ref 0.0–0.2)

## 2022-10-27 LAB — BASIC METABOLIC PANEL
Anion gap: 6 (ref 5–15)
BUN: 15 mg/dL (ref 8–23)
CO2: 30 mmol/L (ref 22–32)
Calcium: 8.6 mg/dL — ABNORMAL LOW (ref 8.9–10.3)
Chloride: 104 mmol/L (ref 98–111)
Creatinine, Ser: 0.55 mg/dL (ref 0.44–1.00)
GFR, Estimated: >60 mL/min (ref >60)
Glucose, Bld: 135 mg/dL — ABNORMAL HIGH (ref 70–99)
Potassium: 3.9 mmol/L (ref 3.5–5.1)
Sodium: 140 mmol/L (ref 135–145)

## 2022-10-27 LAB — ECHOCARDIOGRAM COMPLETE
Area-P 1/2: 2.87 cm2
Calc EF: 60.4 %
Height: 65 in
S' Lateral: 2.4 cm
Single Plane A2C EF: 58.3 %
Single Plane A4C EF: 60.9 %
Weight: 1920 oz

## 2022-10-27 LAB — TYPE AND SCREEN
ABO/RH(D): A POS
Antibody Screen: NEGATIVE

## 2022-10-27 LAB — ABO/RH: ABO/RH(D): A POS

## 2022-10-27 SURGERY — FIXATION, FRACTURE, INTERTROCHANTERIC, WITH INTRAMEDULLARY ROD
Anesthesia: General | Laterality: Right

## 2022-10-27 MED ORDER — DEXAMETHASONE SODIUM PHOSPHATE 10 MG/ML IJ SOLN
INTRAMUSCULAR | Status: AC
  Filled 2022-10-27: qty 1

## 2022-10-27 MED ORDER — CEFAZOLIN SODIUM-DEXTROSE 2-4 GM/100ML-% IV SOLN
INTRAVENOUS | Status: AC
  Filled 2022-10-27: qty 100

## 2022-10-27 MED ORDER — FENTANYL CITRATE PF 50 MCG/ML IJ SOSY
PREFILLED_SYRINGE | INTRAMUSCULAR | Status: AC
  Administered 2022-10-27: 25 ug via INTRAVENOUS
  Filled 2022-10-27: qty 1

## 2022-10-27 MED ORDER — ONDANSETRON HCL 4 MG/2ML IJ SOLN
INTRAMUSCULAR | Status: DC | PRN
  Administered 2022-10-27: 4 mg via INTRAVENOUS

## 2022-10-27 MED ORDER — LACTATED RINGERS IV SOLN: INTRAVENOUS | Status: DC

## 2022-10-27 MED ORDER — ONDANSETRON HCL 4 MG/2ML IJ SOLN: 4.0000 mg | Freq: Once | INTRAMUSCULAR | Status: DC | PRN

## 2022-10-27 MED ORDER — FENTANYL CITRATE (PF) 100 MCG/2ML IJ SOLN: 50.0000 ug | INTRAMUSCULAR | Status: DC | PRN

## 2022-10-27 MED ORDER — OXYCODONE HCL 5 MG/5ML PO SOLN: 5.0000 mg | Freq: Once | ORAL | Status: AC | PRN

## 2022-10-27 MED ORDER — PHENYLEPHRINE HCL-NACL 20-0.9 MG/250ML-% IV SOLN
INTRAVENOUS | Status: DC | PRN
  Administered 2022-10-27: 50 ug/min via INTRAVENOUS

## 2022-10-27 MED ORDER — 0.9 % SODIUM CHLORIDE (POUR BTL) OPTIME
TOPICAL | Status: DC | PRN
  Administered 2022-10-27: 1000 mL

## 2022-10-27 MED ORDER — CEFAZOLIN SODIUM-DEXTROSE 2-4 GM/100ML-% IV SOLN
2.0000 g | Freq: Once | INTRAVENOUS | Status: AC
  Administered 2022-10-27: 2 g via INTRAVENOUS

## 2022-10-27 MED ORDER — DEXAMETHASONE SODIUM PHOSPHATE 10 MG/ML IJ SOLN
INTRAMUSCULAR | Status: DC | PRN
  Administered 2022-10-27: 5 mg via INTRAVENOUS

## 2022-10-27 MED ORDER — PROPOFOL 10 MG/ML IV BOLUS
INTRAVENOUS | Status: AC
  Filled 2022-10-27: qty 20

## 2022-10-27 MED ORDER — ROCURONIUM BROMIDE 10 MG/ML (PF) SYRINGE
PREFILLED_SYRINGE | INTRAVENOUS | Status: DC | PRN
  Administered 2022-10-27: 40 mg via INTRAVENOUS

## 2022-10-27 MED ORDER — ALBUTEROL SULFATE HFA 108 (90 BASE) MCG/ACT IN AERS
INHALATION_SPRAY | RESPIRATORY_TRACT | Status: AC
  Filled 2022-10-27: qty 6.7

## 2022-10-27 MED ORDER — SUCCINYLCHOLINE CHLORIDE 200 MG/10ML IV SOSY
PREFILLED_SYRINGE | INTRAVENOUS | Status: DC | PRN
  Administered 2022-10-27: 100 mg via INTRAVENOUS

## 2022-10-27 MED ORDER — OXYCODONE HCL 5 MG PO TABS: 5.0000 mg | ORAL_TABLET | Freq: Once | ORAL | Status: AC | PRN

## 2022-10-27 MED ORDER — ROCURONIUM BROMIDE 10 MG/ML (PF) SYRINGE
PREFILLED_SYRINGE | INTRAVENOUS | Status: AC
  Filled 2022-10-27: qty 10

## 2022-10-27 MED ORDER — CHLORHEXIDINE GLUCONATE 0.12 % MT SOLN
15.0000 mL | Freq: Once | OROMUCOSAL | Status: AC
  Administered 2022-10-27: 15 mL via OROMUCOSAL

## 2022-10-27 MED ORDER — FENTANYL CITRATE PF 50 MCG/ML IJ SOSY
25.0000 ug | PREFILLED_SYRINGE | INTRAMUSCULAR | Status: DC | PRN
  Administered 2022-10-27: 25 ug via INTRAVENOUS
  Administered 2022-10-27: 50 ug via INTRAVENOUS

## 2022-10-27 MED ORDER — PHENYLEPHRINE HCL (PRESSORS) 10 MG/ML IV SOLN
INTRAVENOUS | Status: AC
  Filled 2022-10-27: qty 1

## 2022-10-27 MED ORDER — FENTANYL CITRATE PF 50 MCG/ML IJ SOSY
PREFILLED_SYRINGE | INTRAMUSCULAR | Status: AC
  Filled 2022-10-27: qty 1

## 2022-10-27 MED ORDER — FENTANYL CITRATE (PF) 100 MCG/2ML IJ SOLN
INTRAMUSCULAR | Status: AC
  Filled 2022-10-27: qty 2

## 2022-10-27 MED ORDER — ACETAMINOPHEN 10 MG/ML IV SOLN
INTRAVENOUS | Status: AC
  Filled 2022-10-27: qty 100

## 2022-10-27 MED ORDER — GLYCOPYRROLATE 0.2 MG/ML IJ SOLN
INTRAMUSCULAR | Status: DC | PRN
  Administered 2022-10-27: .2 mg via INTRAVENOUS

## 2022-10-27 MED ORDER — HYDROMORPHONE HCL 1 MG/ML IJ SOLN: 0.2500 mg | INTRAMUSCULAR | Status: DC | PRN

## 2022-10-27 MED ORDER — ACETAMINOPHEN 10 MG/ML IV SOLN
INTRAVENOUS | Status: DC | PRN
  Administered 2022-10-27: 816 mg via INTRAVENOUS

## 2022-10-27 MED ORDER — LIDOCAINE 2% (20 MG/ML) 5 ML SYRINGE
INTRAMUSCULAR | Status: DC | PRN
  Administered 2022-10-27: 60 mg via INTRAVENOUS

## 2022-10-27 MED ORDER — ONDANSETRON HCL 4 MG/2ML IJ SOLN
INTRAMUSCULAR | Status: AC
  Filled 2022-10-27: qty 2

## 2022-10-27 MED ORDER — SUGAMMADEX SODIUM 200 MG/2ML IV SOLN
INTRAVENOUS | Status: DC | PRN
  Administered 2022-10-27: 200 mg via INTRAVENOUS

## 2022-10-27 MED ORDER — PROPOFOL 10 MG/ML IV BOLUS
INTRAVENOUS | Status: DC | PRN
  Administered 2022-10-27: 100 mg via INTRAVENOUS

## 2022-10-27 MED ORDER — SUCCINYLCHOLINE CHLORIDE 200 MG/10ML IV SOSY
PREFILLED_SYRINGE | INTRAVENOUS | Status: AC
  Filled 2022-10-27: qty 10

## 2022-10-27 SURGICAL SUPPLY — 40 items
BAG COUNTER SPONGE SURGICOUNT (BAG) IMPLANT
BAG SPNG CNTER NS LX DISP (BAG)
BNDG GAUZE DERMACEA FLUFF 4 (GAUZE/BANDAGES/DRESSINGS) ×1 IMPLANT
BNDG GZE DERMACEA 4 6PLY (GAUZE/BANDAGES/DRESSINGS) ×1
COVER PERINEAL POST (MISCELLANEOUS) ×1 IMPLANT
COVER SURGICAL LIGHT HANDLE (MISCELLANEOUS) ×1 IMPLANT
DRAPE STERI IOBAN 125X83 (DRAPES) ×1 IMPLANT
DRESSING MEPILEX FLEX 4X4 (GAUZE/BANDAGES/DRESSINGS) ×2 IMPLANT
DRSG MEPILEX FLEX 4X4 (GAUZE/BANDAGES/DRESSINGS) ×2
DRSG MEPILEX POST OP 4X8 (GAUZE/BANDAGES/DRESSINGS) IMPLANT
DRSG MEPITEL 4X7.2 (GAUZE/BANDAGES/DRESSINGS) IMPLANT
DURAPREP 26ML APPLICATOR (WOUND CARE) ×1 IMPLANT
ELECT REM PT RETURN 15FT ADLT (MISCELLANEOUS) ×1 IMPLANT
FACESHIELD WRAPAROUND (MASK) ×1 IMPLANT
FACESHIELD WRAPAROUND OR TEAM (MASK) ×1 IMPLANT
GAUZE XEROFORM 5X9 LF (GAUZE/BANDAGES/DRESSINGS) ×1 IMPLANT
GLOVE BIO SURGEON STRL SZ7.5 (GLOVE) ×1 IMPLANT
GLOVE BIOGEL PI IND STRL 8 (GLOVE) ×1 IMPLANT
GLOVE ECLIPSE 8.0 STRL XLNG CF (GLOVE) IMPLANT
GOWN STRL REUS W/ TWL XL LVL3 (GOWN DISPOSABLE) ×1 IMPLANT
GOWN STRL REUS W/TWL XL LVL3 (GOWN DISPOSABLE) ×1
GUIDE PIN 3.2X230MM (PIN) ×2
KIT BASIN OR (CUSTOM PROCEDURE TRAY) ×1 IMPLANT
KIT TURNOVER KIT A (KITS) IMPLANT
MANIFOLD NEPTUNE II (INSTRUMENTS) ×1 IMPLANT
NS IRRIG 1000ML POUR BTL (IV SOLUTION) ×1 IMPLANT
PACK GENERAL/GYN (CUSTOM PROCEDURE TRAY) ×1 IMPLANT
PIN GUIDE 3.2X230MM (PIN) IMPLANT
PLATE ANG COMPR 60X135 2H (Plate) IMPLANT
PROTECTOR NERVE ULNAR (MISCELLANEOUS) ×1 IMPLANT
SCREW COMPRESSION 19.0M (Screw) IMPLANT
SCREW CORTICAL SFTP 4.5X40MM (Screw) IMPLANT
SCREW LAG 85MM (Screw) ×1 IMPLANT
SCREW LAGSTD 85X21X12.7X9 (Screw) IMPLANT
STAPLER VISISTAT 35W (STAPLE) ×1 IMPLANT
SUT VIC AB 0 CT1 36 (SUTURE) ×1 IMPLANT
SUT VIC AB 1 CT1 36 (SUTURE) ×1 IMPLANT
SUT VIC AB 2-0 CT1 27 (SUTURE) ×1
SUT VIC AB 2-0 CT1 TAPERPNT 27 (SUTURE) ×1 IMPLANT
TOWEL OR 17X26 10 PK STRL BLUE (TOWEL DISPOSABLE) ×1 IMPLANT

## 2022-10-27 NOTE — Progress Notes (Signed)
Initial Nutrition Assessment  DOCUMENTATION CODES:   Severe malnutrition in context of chronic illness  INTERVENTION:  - NPO for OR. Advance diet to Regular once medically appropriate.  - Recommend Magic cup TID with meals once diet advanced. Each supplement provides 290 kcal and 9 grams of protein. - Encourage intake at all meals and of supplements. - Monitor weight trends.   NUTRITION DIAGNOSIS:   Severe Malnutrition related to chronic illness as evidenced by severe fat depletion, severe muscle depletion.  GOAL:   Patient will meet greater than or equal to 90% of their needs  MONITOR:   PO intake, Supplement acceptance, Diet advancement, Weight trends  REASON FOR ASSESSMENT:   Consult Hip fracture protocol  ASSESSMENT:   81 y.o. female with PMH significant of basal cell carcinoma of the nose, seborrheic dermatitis of the scalp, COVID-19 infection, measles, varicella zoster, HTN, hyperglycemia, myalgias, osteoporosis, tobacco abuse who was brought to the ED due to right hip and right thigh pain after having a fall at home 5 days ago. Found to have R hip fracture and R pubic ramus fracture.   Patient resting in bed at time of visit, son at bedside. Both provided information into patient's nutrition history. Son feels patient has weighed around 96# the last three year. Per EMR, patient weighed at 100# 1 year ago, 115# in April, and weighed this admission at 120#. Son does not feel patient has lost weight but did not feel she had gained weight either.  Son endorses patient "eats nonstop" and has a great appetite. Likes to snack throughout the day on foods such as pudding, fruit cups, crackers, and peanut butter straight from the jar. She does not drink any nutrition supplements at home. Son reports he has given her several to try but she never likes them.   Patient is currently NPO for planned fixation of fracture this evening. Patient hungry. Discussed importance of eating well  during admission to support healing and avoid weight loss. Patient is agreeable to try Magic Cup to support intake once diet advanced.    Medications reviewed and include: Magnesium oxide BID, Senokot  Labs reviewed:  -   NUTRITION - FOCUSED PHYSICAL EXAM:  Flowsheet Row Most Recent Value  Orbital Region Severe depletion  Upper Arm Region Severe depletion  Thoracic and Lumbar Region Severe depletion  Buccal Region Severe depletion  Temple Region Severe depletion  Clavicle Bone Region Severe depletion  Clavicle and Acromion Bone Region Severe depletion  Scapular Bone Region Unable to assess  Dorsal Hand Severe depletion  Patellar Region Severe depletion  Anterior Thigh Region Severe depletion  Posterior Calf Region Severe depletion  Edema (RD Assessment) None  Hair Reviewed  Eyes Reviewed  Mouth Reviewed  Skin Reviewed  Nails Reviewed       Diet Order:   Diet Order             Diet NPO time specified Except for: Sips with Meds  Diet effective now                   EDUCATION NEEDS:  Education needs have been addressed  Skin:  Skin Assessment: Reviewed RN Assessment  Last BM:  12/25  Height:  Ht Readings from Last 1 Encounters:  10/25/22 '5\' 5"'$  (1.651 m)   Weight:  Wt Readings from Last 1 Encounters:  10/25/22 54.4 kg    BMI:  Body mass index is 19.97 kg/m.  Estimated Nutritional Needs:  Kcal:  1550-1650 kcals Protein:  75-85 grams Fluid:  >/= 1.5L    Samson Frederic RD, LDN For contact information, refer to [facility].

## 2022-10-27 NOTE — Anesthesia Procedure Notes (Signed)
Procedure Name: Intubation Date/Time: 10/27/2022 9:26 PM  Performed by: Cleda Daub, CRNAPre-anesthesia Checklist: Patient identified, Emergency Drugs available, Suction available and Patient being monitored Patient Re-evaluated:Patient Re-evaluated prior to induction Oxygen Delivery Method: Circle system utilized Preoxygenation: Pre-oxygenation with 100% oxygen Induction Type: IV induction and Rapid sequence Laryngoscope Size: Mac and 3 Grade View: Grade I Tube type: Oral Tube size: 7.0 mm Number of attempts: 1 Airway Equipment and Method: Stylet and Oral airway Placement Confirmation: ETT inserted through vocal cords under direct vision, positive ETCO2 and breath sounds checked- equal and bilateral Secured at: 22 cm Tube secured with: Tape Dental Injury: Teeth and Oropharynx as per pre-operative assessment

## 2022-10-27 NOTE — Brief Op Note (Signed)
10/27/2022  11:36 PM  PATIENT:  Brenda Gibson  81 y.o. female  PRE-OPERATIVE DIAGNOSIS:  right femoral neck fracture  POST-OPERATIVE DIAGNOSIS:  right femoral neck fracture  PROCEDURE:  Right hip sliding hip screw  SURGEON:  Surgeon(s) and Role:    Callie Fielding, MD - Primary  PHYSICIAN ASSISTANT: none  ASSISTANTS: none   ANESTHESIA:   general  EBL:  150cc   BLOOD ADMINISTERED:none  DRAINS: none   LOCAL MEDICATIONS USED:  NONE  SPECIMEN:  No Specimen  DISPOSITION OF SPECIMEN:  N/A  COUNTS:  YES  TOURNIQUET:  * No tourniquets in log *  DICTATION: .Note written in EPIC  PLAN OF CARE: Admit to inpatient   PATIENT DISPOSITION:  PACU - hemodynamically stable.   Delay start of Pharmacological VTE agent (>24hrs) due to surgical blood loss or risk of bleeding: no

## 2022-10-27 NOTE — Anesthesia Preprocedure Evaluation (Addendum)
Anesthesia Evaluation  Patient identified by MRN, date of birth, ID band Patient awake    Reviewed: Allergy & Precautions, H&P , NPO status , Patient's Chart, lab work & pertinent test results  Airway Mallampati: I  TM Distance: >3 FB Neck ROM: Full    Dental no notable dental hx. (+) Upper Dentures, Lower Dentures   Pulmonary COPD, Current Smoker and Patient abstained from smoking.   breath sounds clear to auscultation + decreased breath sounds      Cardiovascular hypertension, Pt. on medications Normal cardiovascular exam Rhythm:Regular Rate:Normal     Neuro/Psych negative neurological ROS  negative psych ROS   GI/Hepatic negative GI ROS, Neg liver ROS,,,  Endo/Other  negative endocrine ROS    Renal/GU negative Renal ROS  negative genitourinary   Musculoskeletal negative musculoskeletal ROS (+)    Abdominal   Peds negative pediatric ROS (+)  Hematology negative hematology ROS (+)   Anesthesia Other Findings   Reproductive/Obstetrics negative OB ROS                             Anesthesia Physical Anesthesia Plan  ASA: 3  Anesthesia Plan: General   Post-op Pain Management: Ofirmev IV (intra-op)*   Induction: Intravenous  PONV Risk Score and Plan: 2 and Ondansetron, Dexamethasone and Treatment may vary due to age or medical condition  Airway Management Planned: Oral ETT  Additional Equipment:   Intra-op Plan:   Post-operative Plan: Extubation in OR  Informed Consent: I have reviewed the patients History and Physical, chart, labs and discussed the procedure including the risks, benefits and alternatives for the proposed anesthesia with the patient or authorized representative who has indicated his/her understanding and acceptance.     Dental advisory given  Plan Discussed with: CRNA and Surgeon  Anesthesia Plan Comments:        Anesthesia Quick Evaluation

## 2022-10-27 NOTE — Progress Notes (Signed)
PROGRESS NOTE    Brenda Gibson  ONG:[id number] DOB: Feb 07, 1941 DOA: 10/25/2022 PCP: Pcp, No  81/F with history of hypertension, tobacco abuse, suspected COPD, osteoporosis presented to the ED after mechanical fall with right hip and thigh pain, fall occurred 5 days prior to admission, had been ambulating at home with a lot of pain.  Imaging in the ED was concerning for nondisplaced subcapital femoral neck fracture, in addition noted to have oblique fracture of like right pubic bone and displaced oblique fracture of right superior pubic ramus   Subjective: -Going to have surgery later today, symptoms controlled on account of nerve block  Assessment and Plan:  Closed right hip fracture -Following mechanical fall -Pain control, orthopedics consulted, plan for OR today -Add DVT prophylaxis postop, PT OT -Labs in a.m.  Pelvic fractures, pubic ramus -Supportive care, PT  COPD Tobacco abuse -Counseled  Osteoporosis -Smoking cessation, continue calcium, vitamin D -Follow-up with PCP  Grade 1 diastolic dysfunction -Echo ordered by admitting MD will follow-up  DVT prophylaxis: Add Lovenox tomorrow Code Status: Full code Family Communication: Son at bedside Disposition Plan: Anticipate need for SNF  Consultants: Orthopedics   Procedures:   Antimicrobials:    Objective: Vitals:   10/26/22 2313 10/27/22 0227 10/27/22 0553 10/27/22 0926  BP: (!) 156/83 125/66 (!) 145/65 (!) 140/66  Pulse: 65 61 62 76  Resp: '20 17 18 20  '$ Temp: 98.3 F (36.8 C) 97.7 F (36.5 C) 97.8 F (36.6 C) 97.7 F (36.5 C)  TempSrc:  Oral Oral   SpO2: 96% 90% (!) 89% 92%  Weight:      Height:        Intake/Output Summary (Last 24 hours) at 10/27/2022 1401 Last data filed at 10/27/2022 0600 Gross per 24 hour  Intake 480 ml  Output 300 ml  Net 180 ml   Filed Weights   10/25/22 2144  Weight: 54.4 kg    Examination:  General exam: Chronically ill-appearing cachectic, AAOx3 Respiratory  system: Poor air movement bilaterally Cardiovascular system: S1 & S2 heard, RRR.  Abd: nondistended, soft and nontender.Normal bowel sounds heard. Central nervous system: Alert and oriented. No focal neurological deficits. Extremities: no edema Skin: No rashes Psychiatry:  Mood & affect appropriate.     Data Reviewed:   CBC: Recent Labs  Lab 10/26/22 0857 10/27/22 0425  WBC 8.8 5.4  NEUTROABS 6.2  --   HGB 11.7* 10.8*  HCT 39.4 34.9*  MCV 87.9 87.3  PLT 317 440   Basic Metabolic Panel: Recent Labs  Lab 10/26/22 0857 10/27/22 0425  NA 142 140  K 3.5 3.9  CL 102 104  CO2 32 30  GLUCOSE 96 135*  BUN 15 15  CREATININE 0.62 0.55  CALCIUM 9.1 8.6*  MG 2.0  --    GFR: Estimated Creatinine Clearance: 49 mL/min (by C-G formula based on SCr of 0.55 mg/dL). Liver Function Tests: Recent Labs  Lab 10/26/22 0857  AST 15  ALT 14  ALKPHOS 74  BILITOT 0.7  PROT 6.6  ALBUMIN 3.5   No results for input(s): "LIPASE", "AMYLASE" in the last 168 hours. No results for input(s): "AMMONIA" in the last 168 hours. Coagulation Profile: Recent Labs  Lab 10/26/22 0909  INR 1.0   Cardiac Enzymes: No results for input(s): "CKTOTAL", "CKMB", "CKMBINDEX", "TROPONINI" in the last 168 hours. BNP (last 3 results) No results for input(s): "PROBNP" in the last 8760 hours. HbA1C: No results for input(s): "HGBA1C" in the last 72 hours. CBG: No results  for input(s): "GLUCAP" in the last 168 hours. Lipid Profile: No results for input(s): "CHOL", "HDL", "LDLCALC", "TRIG", "CHOLHDL", "LDLDIRECT" in the last 72 hours. Thyroid Function Tests: No results for input(s): "TSH", "T4TOTAL", "FREET4", "T3FREE", "THYROIDAB" in the last 72 hours. Anemia Panel: No results for input(s): "VITAMINB12", "FOLATE", "FERRITIN", "TIBC", "IRON", "RETICCTPCT" in the last 72 hours. Urine analysis:    Component Value Date/Time   COLORURINE YELLOW 02/08/2022 1047   APPEARANCEUR HAZY (A) 02/08/2022 1047    LABSPEC 1.025 02/08/2022 1047   PHURINE 5.0 02/08/2022 1047   GLUCOSEU NEGATIVE 02/08/2022 1047   HGBUR NEGATIVE 02/08/2022 1047   BILIRUBINUR NEGATIVE 02/08/2022 1047   BILIRUBINUR neg 07/08/2015 1136   KETONESUR 5 (A) 02/08/2022 1047   PROTEINUR 30 (A) 02/08/2022 1047   UROBILINOGEN 0.2 07/08/2015 1136   NITRITE NEGATIVE 02/08/2022 1047   LEUKOCYTESUR LARGE (A) 02/08/2022 1047   Sepsis Labs: '@LABRCNTIP'$ (procalcitonin:4,lacticidven:4)  ) Recent Results (from the past 240 hour(s))  Surgical pcr screen     Status: None   Collection Time: 10/26/22  9:15 PM   Specimen: Nasal Mucosa; Nasal Swab  Result Value Ref Range Status   MRSA, PCR NEGATIVE NEGATIVE Final   Staphylococcus aureus NEGATIVE NEGATIVE Final    Comment: (NOTE) The Xpert SA Assay (FDA approved for NASAL specimens in patients 62 years of age and older), is one component of a comprehensive surveillance program. It is not intended to diagnose infection nor to guide or monitor treatment. Performed at [facility], [place] [address] [place], [place] [address]      Radiology Studies: ECHOCARDIOGRAM COMPLETE  Result Date: 10/27/2022    ECHOCARDIOGRAM REPORT   Patient Name:   Brenda Gibson Date of Exam: 10/27/2022 Medical Rec #:  [id number]       Height:       65.0 in Accession #:    [id number]      Weight:       120.0 lb Date of Birth:  04/27/1941        BSA:          1.592 m Patient Age:    50 years        BP:           145/65 mmHg Patient Gender: F               HR:           68 bpm. Exam Location:  Inpatient Procedure: 2D Echo Indications:    Abnormal ECG; Grade 1 Diastolic Dysfunction  History:        Patient has prior history of Echocardiogram examinations, most                 recent 05/27/2021. COPD; Risk Factors:Hypertension.  Sonographer:    Harvie Junior Referring Phys: [id number] [place]  Sonographer Comments: Technically difficult study due to poor echo windows. Image acquisition challenging  due to respiratory motion and Image acquisition challenging due to COPD. IMPRESSIONS  1. Difficult study due to poor echo windows.  2. Left ventricular ejection fraction, by estimation, is 65 to 70%. The left ventricle has normal function. The left ventricle has no regional wall motion abnormalities. There is mild concentric left ventricular hypertrophy. Left ventricular diastolic parameters are consistent with Grade I diastolic dysfunction (impaired relaxation).  3. Right ventricular systolic function is normal. The right ventricular size is not well visualized. There is mildly elevated pulmonary artery systolic pressure. The estimated right ventricular systolic pressure is 38.0  mmHg.  4. The mitral valve is grossly normal. Trivial mitral valve regurgitation. No evidence of mitral stenosis.  5. The aortic valve is tricuspid. There is mild calcification of the aortic valve. There is mild thickening of the aortic valve. Aortic valve regurgitation is not visualized. Aortic valve sclerosis/calcification is present, without any evidence of aortic stenosis.  6. The inferior vena cava is dilated in size with >50% respiratory variability, suggesting right atrial pressure of 8 mmHg. Comparison(s): No significant change from prior study. FINDINGS  Left Ventricle: Left ventricular ejection fraction, by estimation, is 65 to 70%. The left ventricle has normal function. The left ventricle has no regional wall motion abnormalities. The left ventricular internal cavity size was normal in size. There is  mild concentric left ventricular hypertrophy. Left ventricular diastolic parameters are consistent with Grade I diastolic dysfunction (impaired relaxation). Right Ventricle: The right ventricular size is not well visualized. Right vetricular wall thickness was not well visualized. Right ventricular systolic function is normal. There is mildly elevated pulmonary artery systolic pressure. The tricuspid regurgitant velocity is 2.96  m/s, and with an assumed right atrial pressure of 3 mmHg, the estimated right ventricular systolic pressure is 63.8 mmHg. Left Atrium: Left atrial size was normal in size. Right Atrium: Right atrial size was not well visualized. Pericardium: There is no evidence of pericardial effusion. Mitral Valve: The mitral valve is grossly normal. There is mild thickening of the mitral valve leaflet(s). There is mild calcification of the mitral valve leaflet(s). Trivial mitral valve regurgitation. No evidence of mitral valve stenosis. Tricuspid Valve: The tricuspid valve is normal in structure. Tricuspid valve regurgitation is trivial. Aortic Valve: The aortic valve is tricuspid. There is mild calcification of the aortic valve. There is mild thickening of the aortic valve. Aortic valve regurgitation is not visualized. Aortic valve sclerosis/calcification is present, without any evidence of aortic stenosis. Pulmonic Valve: The pulmonic valve was not well visualized. Pulmonic valve regurgitation is not visualized. Aorta: The aortic root is normal in size and structure. Venous: The inferior vena cava is dilated in size with greater than 50% respiratory variability, suggesting right atrial pressure of 8 mmHg. IAS/Shunts: The atrial septum is grossly normal.  LEFT VENTRICLE PLAX 2D LVIDd:         3.50 cm     Diastology LVIDs:         2.40 cm     LV e' medial:    4.57 cm/s LV PW:         1.00 cm     LV E/e' medial:  15.1 LV IVS:        1.00 cm     LV e' lateral:   4.79 cm/s LVOT diam:     1.90 cm     LV E/e' lateral: 14.4 LV SV:         71 LV SV Index:   45 LVOT Area:     2.84 cm  LV Volumes (MOD) LV vol d, MOD A2C: 36.7 ml LV vol d, MOD A4C: 50.4 ml LV vol s, MOD A2C: 15.3 ml LV vol s, MOD A4C: 19.7 ml LV SV MOD A2C:     21.4 ml LV SV MOD A4C:     50.4 ml LV SV MOD BP:      26.9 ml RIGHT VENTRICLE RV S prime:     22.20 cm/s TAPSE (M-mode): 2.9 cm LEFT ATRIUM             Index  RIGHT ATRIUM          Index LA diam:        2.70  cm 1.70 cm/m   RA Area:     6.16 cm LA Vol (A2C):   20.8 ml 13.06 ml/m  RA Volume:   10.10 ml 6.34 ml/m LA Vol (A4C):   34.6 ml 21.73 ml/m LA Biplane Vol: 29.0 ml 18.21 ml/m  AORTIC VALVE             PULMONIC VALVE LVOT Vmax:   125.00 cm/s PV Vmax:       0.94 m/s LVOT Vmean:  76.700 cm/s PV Peak grad:  3.5 mmHg LVOT VTI:    0.250 m  AORTA Ao Root diam: 3.30 cm MITRAL VALVE                TRICUSPID VALVE MV Area (PHT): 2.87 cm     TR Peak grad:   35.0 mmHg MV Decel Time: 264 msec     TR Vmax:        296.00 cm/s MV E velocity: 69.00 cm/s MV A velocity: 100.00 cm/s  SHUNTS MV E/A ratio:  0.69         Systemic VTI:  0.25 m                             Systemic Diam: 1.90 cm Gwyndolyn Kaufman MD Electronically signed by Gwyndolyn Kaufman MD Signature Date/Time: 10/27/2022/11:07:35 AM    Final    DG Chest Portable 1 View  Result Date: 10/26/2022 CLINICAL DATA:  Preop for surgery EXAM: PORTABLE CHEST 1 VIEW COMPARISON:  12/29/2021 FINDINGS: Lungs are hyperinflated as can be seen with COPD. No focal consolidation. No pleural effusion or pneumothorax. Heart and mediastinal contours are unremarkable. No acute osseous abnormality. IMPRESSION: No active disease. Electronically Signed   By: Kathreen Devoid M.D.   On: 10/26/2022 08:50   CT Hip Right Wo Contrast  Result Date: 10/26/2022 CLINICAL DATA:  Right hip injury with equivocal x-rays for possible fracture. EXAM: CT OF THE RIGHT HIP WITHOUT CONTRAST TECHNIQUE: Multidetector CT imaging of the right hip was performed according to the standard protocol. Multiplanar CT image reconstructions were also generated. RADIATION DOSE REDUCTION: This exam was performed according to the departmental dose-optimization program which includes automated exposure control, adjustment of the mA and/or kV according to patient size and/or use of iterative reconstruction technique. COMPARISON:  AP pelvis and right hip plain films from yesterday. FINDINGS: Bones/Joint/Cartilage  Osteopenia. There is acute mildly impacted subcapital proximal right femoral neck fracture, which is otherwise nondisplaced. The fracture may be incomplete as it is better seen along superior cortex of the bone than inferiorly. Also noted is a longitudinal oblique nondisplaced right pubic bone fracture and nondisplaced oblique fracture in the right superior pubic ramus. There is background moderately advanced degenerative arthrosis of the right hip, spurring changes at the pubic symphysis. Degenerative subcortical cystic changes are noted of the anterior column of right acetabulum, and small accessory ossicles or nondisplaced chronic chip fractures are noted of the anterior lip of the acetabulum and the posterolateral edge of it. There is no joint effusion. Ligaments Suboptimally assessed by CT. Muscles and Tendons No acute abnormality within the limits of noncontrast CT technique. Soft tissues There is subcutaneous stranding in the lateral right hip region most likely traumatic etiology. There is moderate iliofemoral calcific arteriosclerosis. No pelvic sidewall hematoma is seen, no pelvic adenopathy or mass.  There is no free fluid in the right hemipelvis. No incarcerated hernia. IMPRESSION: 1. Acute mildly impacted subcapital proximal right femoral neck fracture, which may be incomplete as it is better seen along the superior cortex of the bone than inferiorly. 2. Nondisplaced longitudinal oblique fracture of the right pubic bone and nondisplaced oblique fracture of the right superior pubic ramus. 3. Osteopenia and degenerative change. 4. Calcific arteriosclerosis. Electronically Signed   By: Telford Nab M.D.   On: 10/26/2022 05:53   DG Hip Unilat W or Wo Pelvis 2-3 Views Right  Result Date: 10/25/2022 CLINICAL DATA:  Right leg and hip pain after fall EXAM: DG HIP (WITH OR WITHOUT PELVIS) 2-3V RIGHT COMPARISON:  None Available. FINDINGS: Demineralization. Irregularity about the subcapital right femoral  head neck junction suspicious for nondisplaced fracture. Moderate to advanced arthritis right hip and advanced degenerative arthritis left hip. IMPRESSION: Irregularity about the subcapital right femoral head neck junction suspicious for nondisplaced fracture. Consider CT for confirmation. Electronically Signed   By: Placido Sou M.D.   On: 10/25/2022 22:44   DG FEMUR PORT, 1V RIGHT  Result Date: 10/25/2022 CLINICAL DATA:  Right leg and hip pain after fall EXAM: RIGHT FEMUR PORTABLE 1 VIEW COMPARISON:  CT abdomen and pelvis 05/24/2021 FINDINGS: Demineralization. Irregularity about the femoral head neck junction is suspicious for subcapital fracture. 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 IMPRESSION: Findings suspicious for nondisplaced subcapital femoral neck fracture. Consider CT for confirmation. Electronically Signed   By: Placido Sou M.D.   On: 10/25/2022 22:43     Scheduled Meds:  amLODipine  5 mg Oral Daily   magnesium oxide  400 mg Oral BID   senna-docusate  1 tablet Oral BID   Continuous Infusions:   LOS: 1 day    Time spent: 49mn   PDomenic Polite MD [facility]   10/27/2022, 2:01 PM

## 2022-10-27 NOTE — Consult Note (Signed)
Orthopedic Surgery H&P Note  Assessment: Patient is a 81 y.o. female with right nondisplaced femoral neck fracture   Plan: -Planning for OR tonight. Coordinating with vendors about a hip screw since she is a smoker -Diet: NPO starting at 9am today -DVT ppx: per primary, will do ASA '81mg'$  BID at discharge -Antibiotics: ancef on call to OR -Weight bearing status: NWB RLE -PT/OT evaluate and treat post-op -Pain control -Dispo: pending completion of operative plans   Discussed recommendation for operative intervention in the form of right sliding hip screw. Explained the risks of this procedure included, but were not limited to: nonunion, malunion, hardware failure, infection, bleeding, stiffness, AVN, need for additional procedures, deep vein thrombosis, pulmonary embolism, and death. The benefits of this procedure would be to promote fracture healing by providing stability and to allow for early mobilization. The alternatives of this surgery would be to treat the fracture with immobilization or to make her non-weight bearing. The patient's questions were answered to their satisfaction. After this discussion, patient elected to proceed with surgery. Informed consent was obtained.   ___________________________________________________________________________   Chief complaint: right hip pain  History:  Patient is a 81 y.o. female who had a ground level fall in her home about five days ago. Has been walking on it with significant pain. Came to the ER yesterday to have it evaluated. No pain elsewhere besides the right hip. Got admitted to medicine last night. Remains on the floor. Denies paresthesias and numbness.   Review of systems: General: denies fevers and chills, myalgias Neurologic: denies recent changes in vision, slurred speech Abdomen: denies nausea, vomiting, hematemesis Respiratory: denies cough, shortness of breath  Past medical history:  Basal cell  carcinoma HTN Osteoporosis  Allergies: NKDA   Past surgical history:  Basal cell excision Cataract surgery Laparotomy with LOA   Social history: Reports use of nicotine-containing products (cigarettes, vaping, smokeless, etc.) - smokes cigarettes Alcohol use: rare Denies use of recreational drugs  Family history: -not pertinent to femoral neck fracture   Physical Exam:  General: no acute distress, appears stated age Neurologic: alert, answering questions appropriately, following commands Cardiovascular: regular rate, no cyanosis Respiratory: unlabored breathing on room air, symmetric chest rise Psychiatric: appropriate affect, normal cadence to speech  MSK:   -Bilateral upper extremities  No tenderness to palpation over extremity, no gross deformity Fires deltoid, biceps, triceps, wrist extensors, wrist flexors, finger extensors, finger flexors  AIN/PIN/IO intact  Palpable radial pulse  Sensation intact to light touch in median/ulnar/radial/axillary nerve distributions  Hand warm and well perfused  -Right lower extremity  No tenderness to palpation over extremity, except over the hip  No gross deformity Fires quadriceps, hamstrings, tibialis anterior, gastrocnemius and soleus, extensor hallucis longus  Did not fire hip flexors due to pain Plantarflexes and dorsiflexes toes Sensation intact to light touch in sural, saphenous, tibial, deep peroneal, and superficial peroneal nerve distributions Foot warm and well perfused  -Left lower extremity  No tenderness to palpation over extremity, no gross deformity Fires hip flexors, quadriceps, hamstrings, tibialis anterior, gastrocnemius and soleus, extensor hallucis longus Plantarflexes and dorsiflexes toes Sensation intact to light touch in sural, saphenous, tibial, deep peroneal, and superficial peroneal nerve distributions Foot warm and well perfused  Imaging: XR and CT of the right hip from 10/26/2022 was  independently reviewed and interpreted, showing nondisplaced, valgus impacted femoral neck fracture   Patient name: Brenda Gibson Patient MRN: [id number] Date: '@TODAY'$ @

## 2022-10-27 NOTE — Progress Notes (Signed)
  Echocardiogram 2D Echocardiogram has been performed.  Lana Fish 10/27/2022, 8:59 AM

## 2022-10-27 NOTE — TOC Initial Note (Signed)
Transition of Care [facility]) - Initial/Assessment Note    Patient Details  Name: Brenda Gibson MRN: [id number] Date of Birth: Apr 11, 1941  Transition of Care [facility]) CM/SW Contact:    Lennart Pall, LCSW Phone Number: 10/27/2022, 2:21 PM  Clinical Narrative:                 Met with pt and son today to introduce TOC/ CSW role with dc planning needs.  Pt lying in bed and reports pain from fx and awaiting planned surgery this afternoon.  Son very engaged and eager to speak with CSW about concerns that pt's bank account "has been frozen" leading to her having to move into an RV to live with her him.  Explained to son that [place] really cannot assist with this and he then notes they have contact info for the "social worker with the city attorney office... she needs to take care of it..."   Discussed TOC CSW role with dc planning and they both confirm anticipating need for SNF rehab following surgery.  At this point, await surgery today and therapy evaluations.  Pt has been to [facility] a few years ago and has existing PASRR # [id number].  Expected Discharge Plan: Skilled Nursing Facility Barriers to Discharge: Continued Medical Work up, SNF Pending bed offer   Patient Goals and CMS Choice Patient states their goals for this hospitalization and ongoing recovery are:: home following rehab          Expected Discharge Plan and Services     Post Acute Care Choice: [place] Living arrangements for the past 2 months:  (RV with son)                                      Prior Living Arrangements/Services Living arrangements for the past 2 months:  (RV with son) Lives with:: Adult Children Patient language and need for interpreter reviewed:: Yes        Need for Family Participation in Patient Care: Yes (Comment) Care giver support system in place?: Yes (comment)   Criminal Activity/Legal Involvement Pertinent to Current Situation/Hospitalization: No - Comment as  needed  Activities of Daily Living Home Assistive Devices/Equipment: None ADL Screening (condition at time of admission) Patient's cognitive ability adequate to safely complete daily activities?: Yes Is the patient deaf or have difficulty hearing?: No Does the patient have difficulty seeing, even when wearing glasses/contacts?: No Does the patient have difficulty concentrating, remembering, or making decisions?: No Patient able to express need for assistance with ADLs?: Yes Does the patient have difficulty dressing or bathing?: No Independently performs ADLs?: Yes (appropriate for developmental age) Does the patient have difficulty walking or climbing stairs?: Yes Weakness of Legs: Right Weakness of Arms/Hands: None  Permission Sought/Granted Permission sought to share information with : Family Supports, Chartered certified accountant granted to share information with : Yes, Verbal Permission Granted  Share Information with NAME: [name]  Permission granted to share info w AGENCY: SNFs  Permission granted to share info w Relationship: son  Permission granted to share info w Contact Information: [phone]  Emotional Assessment Appearance:: Appears stated age Attitude/Demeanor/Rapport: Gracious Affect (typically observed): Accepting Orientation: : Oriented to Self, Oriented to Place, Oriented to  Time, Oriented to Situation Alcohol / Substance Use: Not Applicable Psych Involvement: No (comment)  Admission diagnosis:  Closed right hip fracture, initial encounter [facility]) [S72.001A] Patient Active  Problem List   Diagnosis Date Noted   Closed right hip fracture, initial encounter ([place]) 10/26/2022   Constipation 10/26/2022   Pubic ramus fracture, right, closed ([place] [address]) 10/26/2022   Grade I diastolic dysfunction 44/92/0100   COPD (chronic obstructive pulmonary disease) ([place]) 12/12/2021   Acute respiratory failure with hypoxia ([place]) 12/12/2021   Partial small bowel  obstruction ([place]) 05/24/2021   Acute hypoxemic respiratory failure due to COVID-19 [facility]) 05/22/2021   COPD exacerbation ([place]) 05/22/2021   COVID-19 virus infection 05/22/2021   Hyperglycemia 05/22/2021   Closed fracture of left tibial plateau 08/14/2020   Physical deconditioning 07/30/2016   Small bowel obstruction (H[facility])    Hypokalemia 07/04/2016   Hypomagnesemia 07/04/2016   Urinary retention 07/04/2016   Ileus, postoperative ([place]) 07/02/2016   Malnutrition of moderate degree 06/28/2016   SBO (small bowel obstruction) s/p lysis of adhesion 06/25/2016 06/21/2016   Enteritis 06/21/2016   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   Elevated blood pressure reading without diagnosis of hypertension 06/19/2016   Medicare annual wellness visit, subsequent 02/23/2016   Female bladder prolapse 09/03/2014   Routine general medical examination at a health care facility 08/12/2014   Osteoporosis 08/12/2014   Overactive bladder 08/02/2013   Essential hypertension, benign 06/05/2013   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   Ganglion cyst of wrist 02/22/2013   Tobacco abuse 02/24/2011   PCP:  Pcp, No Pharmacy:   [facility] [place], [place] AT [facility] [place] [address] Phone: [phone] Fax: [phone]     Social Determinants of Health (SDOH) Social History: SDOH Screenings   Food Insecurity: No Food Insecurity (10/26/2022)  Housing: Low Risk  (10/26/2022)  Transportation Needs: No Transportation Needs (10/26/2022)  Utilities: Not At Risk (10/26/2022)  Tobacco Use: High Risk (10/26/2022)   SDOH Interventions:     Readmission Risk Interventions     No data to display

## 2022-10-27 NOTE — Transfer of Care (Signed)
Immediate Anesthesia Transfer of Care Note  Patient: Brenda Gibson  Procedure(s) Performed: INTRAMEDULLARY (IM) NAIL INTERTROCHANTERIC (Right)  Patient Location: PACU  Anesthesia Type:General  Level of Consciousness: awake, alert , oriented, and patient cooperative  Airway & Oxygen Therapy: Patient Spontanous Breathing and Patient connected to face mask oxygen  Post-op Assessment: Report given to RN and Post -op Vital signs reviewed and stable  Post vital signs: Reviewed and stable  Last Vitals:  Vitals Value Taken Time  BP 179/86 10/27/22 2336  Temp    Pulse 64 10/27/22 2338  Resp 20 10/27/22 2336  SpO2 100 % 10/27/22 2338  Vitals shown include unvalidated device data.  Last Pain:  Vitals:   10/27/22 2048  TempSrc: Oral  PainSc:       Patients Stated Pain Goal: 2 (29/02/11 1552)  Complications: No notable events documented.

## 2022-10-27 NOTE — Plan of Care (Signed)
Plan of care reviewed and discussed. °

## 2022-10-28 ENCOUNTER — Inpatient Hospital Stay (HOSPITAL_COMMUNITY): Payer: Medicare Other

## 2022-10-28 DIAGNOSIS — S72001A Fracture of unspecified part of neck of right femur, initial encounter for closed fracture: Secondary | ICD-10-CM | POA: Diagnosis not present

## 2022-10-28 LAB — CBC
HCT: 37.4 % (ref 36.0–46.0)
Hemoglobin: 11.3 g/dL — ABNORMAL LOW (ref 12.0–15.0)
MCH: 26.6 pg (ref 26.0–34.0)
MCHC: 30.2 g/dL (ref 30.0–36.0)
MCV: 88 fL (ref 80.0–100.0)
Platelets: 343 10*3/uL (ref 150–400)
RBC: 4.25 MIL/uL (ref 3.87–5.11)
RDW: 14.7 % (ref 11.5–15.5)
WBC: 12.3 10*3/uL — ABNORMAL HIGH (ref 4.0–10.5)
nRBC: 0 % (ref 0.0–0.2)

## 2022-10-28 LAB — BASIC METABOLIC PANEL
Anion gap: 7 (ref 5–15)
BUN: 19 mg/dL (ref 8–23)
CO2: 32 mmol/L (ref 22–32)
Calcium: 8.5 mg/dL — ABNORMAL LOW (ref 8.9–10.3)
Chloride: 99 mmol/L (ref 98–111)
Creatinine, Ser: 0.64 mg/dL (ref 0.44–1.00)
GFR, Estimated: >60 mL/min (ref >60)
Glucose, Bld: 135 mg/dL — ABNORMAL HIGH (ref 70–99)
Potassium: 4.2 mmol/L (ref 3.5–5.1)
Sodium: 138 mmol/L (ref 135–145)

## 2022-10-28 MED ORDER — METHOCARBAMOL 750 MG PO TABS: 750.0000 mg | ORAL_TABLET | Freq: Four times a day (QID) | ORAL | 0 refills | Status: AC

## 2022-10-28 MED ORDER — ENOXAPARIN SODIUM 40 MG/0.4ML IJ SOSY: 40.0000 mg | PREFILLED_SYRINGE | INTRAMUSCULAR | Status: DC

## 2022-10-28 MED ORDER — ENOXAPARIN SODIUM 40 MG/0.4ML IJ SOSY
40.0000 mg | PREFILLED_SYRINGE | INTRAMUSCULAR | Status: DC
  Administered 2022-10-28 – 2022-11-08 (×12): 40 mg via SUBCUTANEOUS
  Filled 2022-10-28 (×12): qty 0.4

## 2022-10-28 MED ORDER — POLYETHYLENE GLYCOL 3350 17 G PO PACK
17.0000 g | PACK | Freq: Every day | ORAL | Status: DC
  Administered 2022-10-28 – 2022-10-30 (×3): 17 g via ORAL
  Filled 2022-10-28 (×3): qty 1

## 2022-10-28 MED ORDER — OXYCODONE HCL 5 MG PO TABS: 5.0000 mg | ORAL_TABLET | ORAL | 0 refills | Status: AC | PRN

## 2022-10-28 MED ORDER — ASPIRIN 81 MG PO TBEC: 81.0000 mg | DELAYED_RELEASE_TABLET | Freq: Two times a day (BID) | ORAL | 0 refills | Status: AC

## 2022-10-28 MED ORDER — SENNA 8.6 MG PO TABS: 1.0000 | ORAL_TABLET | Freq: Two times a day (BID) | ORAL | 0 refills | Status: AC

## 2022-10-28 MED ORDER — SODIUM CHLORIDE 0.9 % IV BOLUS
500.0000 mL | Freq: Once | INTRAVENOUS | Status: AC
  Administered 2022-10-28: 500 mL via INTRAVENOUS

## 2022-10-28 MED ORDER — OXYCODONE HCL 5 MG PO TABS
ORAL_TABLET | ORAL | Status: AC
  Administered 2022-10-28: 5 mg via ORAL
  Filled 2022-10-28: qty 1

## 2022-10-28 MED ORDER — ACETAMINOPHEN 500 MG PO TABS: 1000.0000 mg | ORAL_TABLET | Freq: Three times a day (TID) | ORAL | 0 refills | Status: AC

## 2022-10-28 MED ORDER — CEFAZOLIN SODIUM-DEXTROSE 2-4 GM/100ML-% IV SOLN
2.0000 g | Freq: Three times a day (TID) | INTRAVENOUS | Status: AC
  Administered 2022-10-28 (×2): 2 g via INTRAVENOUS
  Filled 2022-10-28 (×2): qty 100

## 2022-10-28 NOTE — Plan of Care (Signed)
Plan of care reviewed and discussed. °

## 2022-10-28 NOTE — Progress Notes (Signed)
PROGRESS NOTE    CLARABELLE OSCARSON  VVO:[id number] DOB: 04/07/1941 DOA: 10/25/2022 PCP: Pcp, No  81/F with history of hypertension, tobacco abuse, suspected COPD, osteoporosis presented to the ED after mechanical fall with right hip and thigh pain, fall occurred 5 days prior to admission, had been ambulating at home with a lot of pain.  Imaging in the ED was concerning for nondisplaced subcapital femoral neck fracture, in addition noted to have oblique fracture of like right pubic bone and displaced oblique fracture of right superior pubic ramus   Subjective: -Underwent surgery yesterday, denies any complaints  Assessment and Plan:  Closed right hip fracture -Following mechanical fall -Pain control, orthopedics consulted, underwent  sliding hip screw placement by Dr. Laurance Flatten 12/27 -Start Lovenox for DVT prophylaxis, PT OT -Labs in a.m.  Pelvic fractures, pubic ramus -Supportive care, PT  COPD Tobacco abuse -Counseled -Mild hypoxia now, add incentive spirometry and flutter valve  Osteoporosis -Smoking cessation, continue calcium, vitamin D -Follow-up with PCP  Grade 1 diastolic dysfunction -Echo with preserved EF, grade 1 diastolic dysfunction, normal RV  DVT prophylaxis: Start Lovenox Code Status: Full code Family Communication: Son at bedside yesterday Disposition Plan: SNF likely 48 hours  Consultants: Orthopedics   Procedures:   Antimicrobials:    Objective: Vitals:   10/28/22 0032 10/28/22 0206 10/28/22 0541 10/28/22 0928  BP: (!) 154/74 138/66 (!) 140/68 115/66  Pulse: 64 65 65 63  Resp: '17 17 17 16  '$ Temp: 97.7 F (36.5 C) 97.8 F (36.6 C) 98.3 F (36.8 C) 98.2 F (36.8 C)  TempSrc: Oral Oral Oral Oral  SpO2: 99% 95% 93% 90%  Weight:      Height:        Intake/Output Summary (Last 24 hours) at 10/28/2022 1203 Last data filed at 10/28/2022 1000 Gross per 24 hour  Intake 1501.6 ml  Output 550 ml  Net 951.6 ml   Filed Weights   10/25/22 2144  10/27/22 1719  Weight: 54.4 kg 54.4 kg    Examination:  General exam: Chronically ill female appears much older than stated age, AAOx3 CVS: S1-S2, regular rhythm Lungs: Poor air movement bilaterally Abdomen: Soft, nontender, bowel sounds present Extremities: No edema, right hip with dressing  Skin: No rashes Psychiatry:  Mood & affect appropriate.     Data Reviewed:   CBC: Recent Labs  Lab 10/26/22 0857 10/27/22 0425 10/28/22 0828  WBC 8.8 5.4 12.3*  NEUTROABS 6.2  --   --   HGB 11.7* 10.8* 11.3*  HCT 39.4 34.9* 37.4  MCV 87.9 87.3 88.0  PLT 317 285 269   Basic Metabolic Panel: Recent Labs  Lab 10/26/22 0857 10/27/22 0425 10/28/22 0828  NA 142 140 138  K 3.5 3.9 4.2  CL 102 104 99  CO2 32 30 32  GLUCOSE 96 135* 135*  BUN '15 15 19  '$ CREATININE 0.62 0.55 0.64  CALCIUM 9.1 8.6* 8.5*  MG 2.0  --   --    GFR: Estimated Creatinine Clearance: 49 mL/min (by C-G formula based on SCr of 0.64 mg/dL). Liver Function Tests: Recent Labs  Lab 10/26/22 0857  AST 15  ALT 14  ALKPHOS 74  BILITOT 0.7  PROT 6.6  ALBUMIN 3.5   No results for input(s): "LIPASE", "AMYLASE" in the last 168 hours. No results for input(s): "AMMONIA" in the last 168 hours. Coagulation Profile: Recent Labs  Lab 10/26/22 0909  INR 1.0   Cardiac Enzymes: No results for input(s): "CKTOTAL", "CKMB", "CKMBINDEX", "TROPONINI" in the  last 168 hours. BNP (last 3 results) No results for input(s): "PROBNP" in the last 8760 hours. HbA1C: No results for input(s): "HGBA1C" in the last 72 hours. CBG: No results for input(s): "GLUCAP" in the last 168 hours. Lipid Profile: No results for input(s): "CHOL", "HDL", "LDLCALC", "TRIG", "CHOLHDL", "LDLDIRECT" in the last 72 hours. Thyroid Function Tests: No results for input(s): "TSH", "T4TOTAL", "FREET4", "T3FREE", "THYROIDAB" in the last 72 hours. Anemia Panel: No results for input(s): "VITAMINB12", "FOLATE", "FERRITIN", "TIBC", "IRON", "RETICCTPCT"  in the last 72 hours. Urine analysis:    Component Value Date/Time   COLORURINE YELLOW 02/08/2022 1047   APPEARANCEUR HAZY (A) 02/08/2022 1047   LABSPEC 1.025 02/08/2022 1047   PHURINE 5.0 02/08/2022 1047   GLUCOSEU NEGATIVE 02/08/2022 1047   HGBUR NEGATIVE 02/08/2022 1047   BILIRUBINUR NEGATIVE 02/08/2022 1047   BILIRUBINUR neg 07/08/2015 1136   KETONESUR 5 (A) 02/08/2022 1047   PROTEINUR 30 (A) 02/08/2022 1047   UROBILINOGEN 0.2 07/08/2015 1136   NITRITE NEGATIVE 02/08/2022 1047   LEUKOCYTESUR LARGE (A) 02/08/2022 1047   Sepsis Labs: '@LABRCNTIP'$ (procalcitonin:4,lacticidven:4)  ) Recent Results (from the past 240 hour(s))  Surgical pcr screen     Status: None   Collection Time: 10/26/22  9:15 PM   Specimen: Nasal Mucosa; Nasal Swab  Result Value Ref Range Status   MRSA, PCR NEGATIVE NEGATIVE Final   Staphylococcus aureus NEGATIVE NEGATIVE Final    Comment: (NOTE) The Xpert SA Assay (FDA approved for NASAL specimens in patients 49 years of age and older), is one component of a comprehensive surveillance program. It is not intended to diagnose infection nor to guide or monitor treatment. Performed at [facility], [place] [address], [place], [place] [address]      Radiology Studies: DG FEMUR, MIN 2 VIEWS RIGHT  Result Date: 10/28/2022 CLINICAL DATA:  ORIF right hip fracture EXAM: RIGHT FEMUR 2 VIEWS COMPARISON:  10/27/1999 23 FINDINGS: Postop changes. Compression screw and lateral plate proximal femur. Right femoral neck fracture in satisfactory alignment. Hardware in satisfactory position Mild degenerative change in the right hip joint with spurring of the femoral head. No complication identified. IMPRESSION: ORIF right femoral neck fracture. Electronically Signed   By: Franchot Gallo M.D.   On: 10/28/2022 07:43   DG HIP UNILAT WITH PELVIS 2-3 VIEWS RIGHT  Result Date: 10/27/2022 CLINICAL DATA:  Right hip intramedullary nail. EXAM: DG HIP (WITH OR  WITHOUT PELVIS) 2-3V RIGHT COMPARISON:  None Available. FINDINGS: Multiple intraoperative fluoroscopic images for right femoral neck intramedullary nail placement. Total fluoroscopic time was 1 minute 4 seconds and fluoroscopic dose was 11.043 mGy. IMPRESSION: Multiple intraoperative fluoroscopic images for right femoral neck intramedullary nail placement. Electronically Signed   By: Keane Police D.O.   On: 10/27/2022 23:45   DG C-Arm 1-60 Min-No Report  Result Date: 10/27/2022 Fluoroscopy was utilized by the requesting physician.  No radiographic interpretation.   DG C-Arm 1-60 Min-No Report  Result Date: 10/27/2022 Fluoroscopy was utilized by the requesting physician.  No radiographic interpretation.   ECHOCARDIOGRAM COMPLETE  Result Date: 10/27/2022    ECHOCARDIOGRAM REPORT   Patient Name:   ANASTASSIA NOACK Date of Exam: 10/27/2022 Medical Rec #:  [id number]       Height:       65.0 in Accession #:    [id number]      Weight:       120.0 lb Date of Birth:  November 02, 1940        BSA:  1.592 m Patient Age:    44 years        BP:           145/65 mmHg Patient Gender: F               HR:           68 bpm. Exam Location:  Inpatient Procedure: 2D Echo Indications:    Abnormal ECG; Grade 1 Diastolic Dysfunction  History:        Patient has prior history of Echocardiogram examinations, most                 recent 05/27/2021. COPD; Risk Factors:Hypertension.  Sonographer:    Harvie Junior Referring Phys: [id number] [place]  Sonographer Comments: Technically difficult study due to poor echo windows. Image acquisition challenging due to respiratory motion and Image acquisition challenging due to COPD. IMPRESSIONS  1. Difficult study due to poor echo windows.  2. Left ventricular ejection fraction, by estimation, is 65 to 70%. The left ventricle has normal function. The left ventricle has no regional wall motion abnormalities. There is mild concentric left ventricular hypertrophy. Left ventricular  diastolic parameters are consistent with Grade I diastolic dysfunction (impaired relaxation).  3. Right ventricular systolic function is normal. The right ventricular size is not well visualized. There is mildly elevated pulmonary artery systolic pressure. The estimated right ventricular systolic pressure is 93.7 mmHg.  4. The mitral valve is grossly normal. Trivial mitral valve regurgitation. No evidence of mitral stenosis.  5. The aortic valve is tricuspid. There is mild calcification of the aortic valve. There is mild thickening of the aortic valve. Aortic valve regurgitation is not visualized. Aortic valve sclerosis/calcification is present, without any evidence of aortic stenosis.  6. The inferior vena cava is dilated in size with >50% respiratory variability, suggesting right atrial pressure of 8 mmHg. Comparison(s): No significant change from prior study. FINDINGS  Left Ventricle: Left ventricular ejection fraction, by estimation, is 65 to 70%. The left ventricle has normal function. The left ventricle has no regional wall motion abnormalities. The left ventricular internal cavity size was normal in size. There is  mild concentric left ventricular hypertrophy. Left ventricular diastolic parameters are consistent with Grade I diastolic dysfunction (impaired relaxation). Right Ventricle: The right ventricular size is not well visualized. Right vetricular wall thickness was not well visualized. Right ventricular systolic function is normal. There is mildly elevated pulmonary artery systolic pressure. The tricuspid regurgitant velocity is 2.96 m/s, and with an assumed right atrial pressure of 3 mmHg, the estimated right ventricular systolic pressure is 16.9 mmHg. Left Atrium: Left atrial size was normal in size. Right Atrium: Right atrial size was not well visualized. Pericardium: There is no evidence of pericardial effusion. Mitral Valve: The mitral valve is grossly normal. There is mild thickening of the mitral  valve leaflet(s). There is mild calcification of the mitral valve leaflet(s). Trivial mitral valve regurgitation. No evidence of mitral valve stenosis. Tricuspid Valve: The tricuspid valve is normal in structure. Tricuspid valve regurgitation is trivial. Aortic Valve: The aortic valve is tricuspid. There is mild calcification of the aortic valve. There is mild thickening of the aortic valve. Aortic valve regurgitation is not visualized. Aortic valve sclerosis/calcification is present, without any evidence of aortic stenosis. Pulmonic Valve: The pulmonic valve was not well visualized. Pulmonic valve regurgitation is not visualized. Aorta: The aortic root is normal in size and structure. Venous: The inferior vena cava is dilated in size with greater than  50% respiratory variability, suggesting right atrial pressure of 8 mmHg. IAS/Shunts: The atrial septum is grossly normal.  LEFT VENTRICLE PLAX 2D LVIDd:         3.50 cm     Diastology LVIDs:         2.40 cm     LV e' medial:    4.57 cm/s LV PW:         1.00 cm     LV E/e' medial:  15.1 LV IVS:        1.00 cm     LV e' lateral:   4.79 cm/s LVOT diam:     1.90 cm     LV E/e' lateral: 14.4 LV SV:         71 LV SV Index:   45 LVOT Area:     2.84 cm  LV Volumes (MOD) LV vol d, MOD A2C: 36.7 ml LV vol d, MOD A4C: 50.4 ml LV vol s, MOD A2C: 15.3 ml LV vol s, MOD A4C: 19.7 ml LV SV MOD A2C:     21.4 ml LV SV MOD A4C:     50.4 ml LV SV MOD BP:      26.9 ml RIGHT VENTRICLE RV S prime:     22.20 cm/s TAPSE (M-mode): 2.9 cm LEFT ATRIUM             Index        RIGHT ATRIUM          Index LA diam:        2.70 cm 1.70 cm/m   RA Area:     6.16 cm LA Vol (A2C):   20.8 ml 13.06 ml/m  RA Volume:   10.10 ml 6.34 ml/m LA Vol (A4C):   34.6 ml 21.73 ml/m LA Biplane Vol: 29.0 ml 18.21 ml/m  AORTIC VALVE             PULMONIC VALVE LVOT Vmax:   125.00 cm/s PV Vmax:       0.94 m/s LVOT Vmean:  76.700 cm/s PV Peak grad:  3.5 mmHg LVOT VTI:    0.250 m  AORTA Ao Root diam: 3.30 cm MITRAL  VALVE                TRICUSPID VALVE MV Area (PHT): 2.87 cm     TR Peak grad:   35.0 mmHg MV Decel Time: 264 msec     TR Vmax:        296.00 cm/s MV E velocity: 69.00 cm/s MV A velocity: 100.00 cm/s  SHUNTS MV E/A ratio:  0.69         Systemic VTI:  0.25 m                             Systemic Diam: 1.90 cm Gwyndolyn Kaufman MD Electronically signed by Gwyndolyn Kaufman MD Signature Date/Time: 10/27/2022/11:07:35 AM    Final      Scheduled Meds:  amLODipine  5 mg Oral Daily   magnesium oxide  400 mg Oral BID   senna-docusate  1 tablet Oral BID   Continuous Infusions:   ceFAZolin (ANCEF) IV 2 g (10/28/22 0555)   lactated ringers 10 mL/hr at 10/28/22 1108     LOS: 2 days    Time spent: 37mn   PDomenic Polite MD [facility]   10/28/2022, 12:03 PM

## 2022-10-28 NOTE — Discharge Instructions (Signed)
Orthopedic Surgery Discharge Instructions  Patient name: Brenda Gibson Procedure Performed: right hip fracture internal fixation Date of Surgery: 10/27/2022 Surgeon: Ileene Rubens, MD  Fracture Diagnosis: right femoral neck fracture  Activity: You are allowed to weight bear as tolerated on the right lower extremity. You should let pain be your guide in terms of how much to do. Some soreness is expected but if the pain becomes more significant then back off that activity. You can walk as much as you would like. You can perform household activities (vacuum, cook, dishes, etc.) after this surgery as tolerated.   Incision Care: Your incision site has a dressing over it. That dressing should remain in place and dry at all times for the first one week after surgery. You should keep the dressing in place and dry at all times until the first week after surgery. After one week, you can remove the dressing. There are surgical staples over the skin that were used to help close the wound. Leave those in place. Once one week has passed and the dressing is off, you are okay to shower. You can let soap and water run over the incision. Do not scrub, rub, or pick at the surgical site. Any blood over that area will come off with time. Do not submerge the wound until at least six weeks after surgery. This means no baths, swimming, hot tubs, etc. Some bloody drainage is expected after surgery. Leave the dressing in place if there is some blood on the dressing. If the dressing becomes saturated, please call the office for further instructions.   Medications: You have been prescribed oxycodone. This is a narcotic pain medication and should only be taken as prescribed. You should not drink alcohol or operate heavy machinery (including driving) while taking this medication. The oxycodone can cause constipation as a side effect. For that reason, you have been prescribed senna. This is a laxative. You do not need to take this  medication if you develop diarrhea. Should you remain constipated even while taking the senna, please use over-the-counter miralax as instructed on the packaging to promote regular bowel movements. Tylenol has been prescribed to be taken every 8 hours, which will give you additional pain relief. Robaxin has been prescribed to you to help with muscle spasm type pain. This pain usually subsides after the first week and you can stop taking it once any muscle spasm pain subsides. Finally, you have been prescribed '81mg'$  aspirin to be taken twice daily. This is being given to you to prevent blood clots after surgery. You will need to take this for the first six weeks after surgery.   Do not take NSAIDs (ibuprofen, Aleve, Celebrex, naproxen, meloxicam, etc.) while using the aspirin I have prescribed to thin your blood.   In order to set expectations for opioid prescriptions, you will only be prescribed opioids for a total of six weeks after surgery and, at two-weeks after surgery, your opioid prescription will start to tapered (decreased dosage and number of pills). If you have ongoing need for opioid medication six weeks after surgery, you will be referred to pain management. If you are already established with a provider that is giving you opioid medications, you should schedule an appointment with them for six weeks after surgery if you feel you are going to need another prescription. State law only allows for opioid prescriptions one week at a time. If you are running out of opioid medication near the end of the week, please call  the office during business hours before running out so I can send you another prescription.   Driving: You should not drive while taking narcotic pain medications. Once you are off the narcotics, you can try to start driving again. You should try going to a parking lot first and then increasing your amount of driving from there.  Diet: You are safe to resume your regular diet after  surgery.   Reasons to Call the Office After Surgery: You should feel free to call the office with any concerns or questions you have in the post-operative period, but you should definitely notify the office if you develop: -shortness of breath, chest pain, or trouble breathing -excessive bleeding, drainage, redness, or swelling around the surgical site -fevers, chills, or pain that is getting worse with each passing day -persistent nausea or vomiting -new weakness in the leg, new or worsening numbness or tingling in the leg -other concerns about your surgery  Follow Up Appointments: You should be seen 15-21 days after surgery. You should call the office to schedule this appointment that at a time/date that is convenient for you.   Office Information:  -Dr. Ileene Rubens -Phone number: [phone] -Address: [address]       [place], [place] [address]

## 2022-10-28 NOTE — Op Note (Signed)
Orthopedic Surgery Operative Report  Procedure: Open reduction internal fixation right femoral neck fracture with hip screw  Modifier: none  Date of procedure: 10/27/2022  Patient name: Brenda Gibson MRN: [id number] DOB: 1941/01/04  Surgeon: Ileene Rubens, MD Assistant: None Pre-operative diagnosis: right femoral neck fracture Post-operative diagnosis: same as above Findings: right femoral neck fracture  Specimens: none Anesthesia: general EBL: 458KD Complications: none Pre-incision antibiotic: ancef  Implants:  Implant Name Type Inv. Item Serial No. Manufacturer Lot No. LRB No. Used Action  SCREW LAG 85MM - [id number] Screw SCREW LAG 85MM  [employer] [id number] Right 1 Implanted  135 degree  2 slot 60 mm    [employer] [id number] Right 1 Implanted  SCREW CORTICAL SFTP 4.5X40MM - [id number] Screw SCREW CORTICAL SFTP 4.5X40MM  [employer]  Right 2 Implanted      Indication for procedure: Patient is a 81 y.o. female who had a ground level fall several days ago. She decided to present to the ER on 12/26 and was found to have a right femoral neck fracture. She was admitted to medicine for pre-operative optimization . The patient was met in the hospital. Her diagnosis was explained to her and a recommendation was made for operative stabilization to allow for early mobilization and to maximize chances of successful healing in the current alignment. The risks of surgery, including but not limited to AVN, malunion, nonunion, fixation failure, infection, bleeding, stiffness, need for additional procedures, death, and blood clot were discussed with the patient. The alternatives to surgery including non weight bearing for several weeks or no intervention were covered with her. After this discussion, patient elected to proceed with surgery and informed consent was obtained.   Procedure Description: The patient was met in the pre-operative  holding area. The patient's identity and consent were verified. The operative site was marked. The patient's remaining questions about the surgery were answered. The patient was brought back to the operating room. General anesthesia was induced and an endotracheal tube was placed by the anesthesia staff. The patient was transferred to the [facility] table in the supine position. All bony prominences were well padded. The surgical area was cleansed with alcohol. Fluoroscopy was then brought in to confirm that the fracture was still valgus impacted. The patient's skin was then prepped and draped in a standard, sterile fashion. A time out was performed that identified the patient, the procedure, and the laterality. All team members agreed with what was stated in the time out.   Fluoroscopy was used to mark out the planned plate placement. An incision over the lateral aspect of the proximal femur at the previously marked area was made. It was taken sharply down through skin and dermis. Electrocautery was then used to dissect through the subcutaneous fat down to the IT band. The IT band was incised with electrocautery in line with the incision. A cobb was used to find a plane through the vastus lateralis. IT was also used to elevate the muscle off of the lateral femur. A 135 degree gauge was the placed along the lateral aspect of the femur. A guide pin was placed through the guide and advanced into the femoral neck and head under AP and lateral fluoroscopic guidance. Once the guide pin was felt to be in the center-center position within the femoral head, a lag screw length was estimated off of the wire. That size lag screw was selected. A parallel pin guide was then placed over the first  pin to allow for a second pin to be placed into the femoral head and act as a antirotation pin. The drill for the lag screw was placed over the first guide pin and drilled under AP fluoroscopic guidance to a couple millimeters short of the  end of the guide pin. The lag screw was the placed over the guide wire and advanced into the femoral head. The side plate was placed onto bone and lightly impacted into place. AP and lateral fluoroscopic images confirmed satisfactory position of the plate and screw. A drill was then used to drill the most distal hole in the plate bicortically. A depth gauge was used to estimate the screw length. That length screw was then inserted through the distal hole. Good purchase was obtained. The same process was repeated for a second screw that was placed through the plate. The set cap was then placed into the lag screw and tightened into place. Final AP and lateral fluoroscopic films were taken and confirmed satisfactory fracture alignment and fixation placement. The wound was copiously irrigated with sterile saline. The IT band was reapproximated with 0 vicryl. The deep dermal layer was closed with 2-0 vicryl. The skin was closed with staples. All counts were correct at the end of the case. The patient was transferred back to a hospital bed and awakened from anesthesia. She was extubated and brought back to PACU in stable condition.   Post-operative plan: The patient will recover in the post-anesthesia care unit and then go to the floor. The patient will receive two post-operative doses of ancef. The patient will be weight bearing as tolerated. The patient will work with physical therapy.  Her disposition will be determined by the medicine service.  Ileene Rubens, MD Orthopedic Surgeon

## 2022-10-28 NOTE — Plan of Care (Signed)
  Problem: Pain Managment: Goal: General experience of comfort will improve Outcome: Progressing   

## 2022-10-28 NOTE — Progress Notes (Addendum)
Orthopedic Surgery Progress Note   Assessment: Patient is a 81 y.o. female with right femoral neck fracture status post hip screw   Plan: -Operative plans: complete -Diet: regular -DVT ppx: per primary, prescribed aspirin '81mg'$  BID for discharge  -Antibiotics: ancef x2 post-op doses -Weight bearing status: as tolerated -PT/OT evaluate and treat -Pain control -Discussed nicotine cessation with patient and son due to increased risk of fracture not healing, infection, and wound healing complications -Dispo: per primary  ___________________________________________________________________________  Subjective: No acute events overnight. Was able to get some sleep after surgery. Pain controlled. Denies paresthesias and numbness.    Physical Exam:  General: no acute distress, appears stated age Neurologic: sleeping but awakes to voice, answering questions appropriately, following commands Respiratory: unlabored breathing on supplemental O2, symmetric chest rise  MSK:   -Right lower extremity  Dressing over hip c/d/i EHL/TA/GSC intact Plantarflexes and dorsiflexes toes Sensation intact to light touch in sural, saphenous, tibial, deep peroneal, and superficial peroneal nerve distributions Foot warm and well perfused   Patient name: Brenda Gibson Patient MRN: [id number] Date: 10/28/22

## 2022-10-28 NOTE — Progress Notes (Signed)
Notified Dr. Domenic Polite, MD to inform that patient has not voided since 6:45 PM yesterday. Bladder scan done at the 12 PM hour today and results were 0 ml. Bladder scan repeated at 4:10 PM and results were 63 mL. Also let Dr. Broadus John, MD know that patient's abdomen was distended and she has not had a BM in 3 days. New orders: 500 mL Normal saline IV bolus  at 250 ml/hr and Miralax. Waiting for pharmacy to verify the medications.

## 2022-10-29 ENCOUNTER — Encounter (HOSPITAL_COMMUNITY): Payer: Self-pay | Admitting: Orthopedic Surgery

## 2022-10-29 DIAGNOSIS — S72001A Fracture of unspecified part of neck of right femur, initial encounter for closed fracture: Secondary | ICD-10-CM | POA: Diagnosis not present

## 2022-10-29 LAB — CBC
HCT: 36.9 % (ref 36.0–46.0)
Hemoglobin: 11.3 g/dL — ABNORMAL LOW (ref 12.0–15.0)
MCH: 26.6 pg (ref 26.0–34.0)
MCHC: 30.6 g/dL (ref 30.0–36.0)
MCV: 86.8 fL (ref 80.0–100.0)
Platelets: 328 10*3/uL (ref 150–400)
RBC: 4.25 MIL/uL (ref 3.87–5.11)
RDW: 14.7 % (ref 11.5–15.5)
WBC: 9.2 10*3/uL (ref 4.0–10.5)
nRBC: 0 % (ref 0.0–0.2)

## 2022-10-29 LAB — COMPREHENSIVE METABOLIC PANEL
ALT: 9 U/L (ref 0–44)
AST: 16 U/L (ref 15–41)
Albumin: 2.8 g/dL — ABNORMAL LOW (ref 3.5–5.0)
Alkaline Phosphatase: 70 U/L (ref 38–126)
Anion gap: 4 — ABNORMAL LOW (ref 5–15)
BUN: 19 mg/dL (ref 8–23)
CO2: 34 mmol/L — ABNORMAL HIGH (ref 22–32)
Calcium: 8.6 mg/dL — ABNORMAL LOW (ref 8.9–10.3)
Chloride: 98 mmol/L (ref 98–111)
Creatinine, Ser: 0.53 mg/dL (ref 0.44–1.00)
GFR, Estimated: >60 mL/min (ref >60)
Glucose, Bld: 117 mg/dL — ABNORMAL HIGH (ref 70–99)
Potassium: 3.8 mmol/L (ref 3.5–5.1)
Sodium: 136 mmol/L (ref 135–145)
Total Bilirubin: 0.6 mg/dL (ref 0.3–1.2)
Total Protein: 5.8 g/dL — ABNORMAL LOW (ref 6.5–8.1)

## 2022-10-29 MED ORDER — AMLODIPINE BESYLATE 5 MG PO TABS
5.0000 mg | ORAL_TABLET | Freq: Once | ORAL | Status: AC
  Administered 2022-10-29: 5 mg via ORAL
  Filled 2022-10-29: qty 1

## 2022-10-29 MED ORDER — AMLODIPINE BESYLATE 10 MG PO TABS
10.0000 mg | ORAL_TABLET | Freq: Every day | ORAL | Status: DC
  Administered 2022-10-30 – 2022-10-31 (×2): 10 mg via ORAL
  Filled 2022-10-29 (×2): qty 1

## 2022-10-29 MED ORDER — BISACODYL 10 MG RE SUPP
10.0000 mg | Freq: Once | RECTAL | Status: AC
  Administered 2022-10-29: 10 mg via RECTAL
  Filled 2022-10-29: qty 1

## 2022-10-29 MED ORDER — POLYETHYLENE GLYCOL 3350 17 G PO PACK: 17.0000 g | PACK | Freq: Every day | ORAL | Status: DC

## 2022-10-29 NOTE — Evaluation (Signed)
Physical Therapy Evaluation Patient Details Name: Brenda Gibson MRN: [id number] DOB: 1941/05/27 Today's Date: 10/29/2022  History of Present Illness  81/F with history of hypertension, tobacco abuse, suspected COPD, osteoporosis presented to the ED after mechanical fall with right hip and thigh pain. Found to have nondisplaced subcapital femoral neck fracture, in addition noted to have oblique fracture of like right pubic bone and displaced oblique fracture of right superior pubic ramus  -12/27 underwent internal fixation of hip with screw placement.  Clinical Impression    [name] is a 84 year woman who presents with pain, decreased activity tolerance, impaired balance and history of falling. Evaluation limited. Patient refused any further movement mid transfer to edge of bed due to pain. Pt is currently requiring significant assist for all basic mobility tasks and will benefit from skilled PT services while in hospital and at SNF level rehab to maximize IND and safety for return home with limited assist.     Recommendations for follow up therapy are one component of a multi-disciplinary discharge planning process, led by the attending physician.  Recommendations may be updated based on patient status, additional functional criteria and insurance authorization.  Follow Up Recommendations Skilled nursing-short term rehab (<3 hours/day) Can patient physically be transported by private vehicle: No    Assistance Recommended at Discharge Frequent or constant Supervision/Assistance  Patient can return home with the following  A lot of help with walking and/or transfers;A lot of help with bathing/dressing/bathroom;Assistance with cooking/housework;Assist for transportation;Help with stairs or ramp for entrance    Equipment Recommendations None recommended by PT  Recommendations for Other Services       Functional Status Assessment Patient has had a recent decline in their functional  status and demonstrates the ability to make significant improvements in function in a reasonable and predictable amount of time.     Precautions / Restrictions Precautions Precautions: Fall Restrictions Weight Bearing Restrictions: No RLE Weight Bearing: Weight bearing as tolerated      Mobility  Bed Mobility Overal bed mobility: Needs Assistance Bed Mobility: Supine to Sit, Sit to Supine     Supine to sit: Mod assist, +2 for safety/equipment Sit to supine: Mod assist, +2 for safety/equipment   General bed mobility comments: Declined further mobility mid transfer to edge of bed stating "I just need to chill for a minute" -- reports pain limiting movement.    Transfers                   General transfer comment: declined    Ambulation/Gait                  Stairs            Wheelchair Mobility    Modified Rankin (Stroke Patients Only)       Balance                                             Pertinent Vitals/Pain Pain Assessment Pain Assessment: Faces Faces Pain Scale: Hurts little more Pain Location: R hip Pain Descriptors / Indicators: Grimacing, Guarding Pain Intervention(s): Limited activity within patient's tolerance, Monitored during session, Premedicated before session    Home Living Family/patient expects to be discharged to:: Skilled nursing facility Living Arrangements: Children                 Additional Comments:  Patient lives with her son who works during the day.    Prior Function Prior Level of Function : Independent/Modified Independent                     Hand Dominance   Dominant Hand: Right    Extremity/Trunk Assessment   Upper Extremity Assessment Upper Extremity Assessment: Overall WFL for tasks assessed    Lower Extremity Assessment Lower Extremity Assessment: RLE deficits/detail    Cervical / Trunk Assessment Cervical / Trunk Assessment: Normal  Communication    Communication: No difficulties  Cognition Arousal/Alertness: Awake/alert Behavior During Therapy: WFL for tasks assessed/performed Overall Cognitive Status: Within Functional Limits for tasks assessed                                          General Comments      Exercises     Assessment/Plan    PT Assessment Patient needs continued PT services  PT Problem List Decreased strength;Decreased range of motion;Decreased activity tolerance;Decreased balance;Decreased mobility;Decreased knowledge of use of DME;Pain       PT Treatment Interventions DME instruction;Gait training;Functional mobility training;Therapeutic activities;Therapeutic exercise;Patient/family education    PT Goals (Current goals can be found in the Care Plan section)  Acute Rehab PT Goals Patient Stated Goal: Rehab and then home to son PT Goal Formulation: With patient Time For Goal Achievement: 11/11/22 Potential to Achieve Goals: Good    Frequency Min 3X/week     Co-evaluation               AM-PAC PT "6 Clicks" Mobility  Outcome Measure Help needed turning from your back to your side while in a flat bed without using bedrails?: A Lot Help needed moving from lying on your back to sitting on the side of a flat bed without using bedrails?: A Lot Help needed moving to and from a bed to a chair (including a wheelchair)?: A Lot Help needed standing up from a chair using your arms (e.g., wheelchair or bedside chair)?: A Lot Help needed to walk in hospital room?: Total Help needed climbing 3-5 steps with a railing? : Total 6 Click Score: 10    End of Session Equipment Utilized During Treatment: Gait belt Activity Tolerance: Other (comment);Patient limited by pain Patient left: in bed;with call bell/phone within reach;with bed alarm set Nurse Communication: Mobility status PT Visit Diagnosis: Difficulty in walking, not elsewhere classified (R26.2);Pain Pain - Right/Left: Right Pain -  part of body: Hip    Time: 3382-5053 PT Time Calculation (min) (ACUTE ONLY): 25 min   Charges:   PT Evaluation $PT Eval Low Complexity: 1 Low          [place] Pager [phone] Office [phone]   Leann Mayweather 10/29/2022, 12:56 PM

## 2022-10-29 NOTE — Anesthesia Postprocedure Evaluation (Signed)
Anesthesia Post Note  Patient: Brenda Gibson  Procedure(s) Performed: INTRAMEDULLARY (IM) NAIL INTERTROCHANTERIC (Right)     Patient location during evaluation: PACU Anesthesia Type: General Level of consciousness: awake and alert Pain management: pain level controlled Vital Signs Assessment: post-procedure vital signs reviewed and stable Respiratory status: spontaneous breathing, nonlabored ventilation, respiratory function stable and patient connected to nasal cannula oxygen Cardiovascular status: blood pressure returned to baseline and stable Postop Assessment: no apparent nausea or vomiting Anesthetic complications: no  No notable events documented.  Last Vitals:  Vitals:   10/28/22 2202 10/29/22 0541  BP: 138/65 (!) 165/86  Pulse: 62 (!) 56  Resp: 17 17  Temp: 36.8 C 36.8 C  SpO2: 91% 97%    Last Pain:  Vitals:   10/29/22 0749  TempSrc:   PainSc: 6                  [name]

## 2022-10-29 NOTE — Progress Notes (Signed)
Orthopedic Surgery Progress Note   Assessment: Patient is a 81 y.o. female with right femoral neck fracture status post hip screw   Plan: -Operative plans: complete -Diet: regular -DVT ppx: per primary, prescribed aspirin '81mg'$  BID for discharge  -Antibiotics: ancef x2 post-op doses -Weight bearing status: as tolerated -PT/OT evaluate and treat -Pain control -Dispo: per primary  ___________________________________________________________________________  Subjective: No acute events overnight. Had some soreness in the hip when trying to mobilize. No pain outside of her right hip. Denies paresthesias and numbness.    Physical Exam:  General: no acute distress, appears stated age Neurologic: alert, answering questions appropriately, following commands Respiratory: unlabored breathing on supplemental O2, symmetric chest rise  MSK:   -Right lower extremity  Dressing over hip c/d/i EHL/TA/GSC intact Plantarflexes and dorsiflexes toes Sensation intact to light touch in sural, saphenous, tibial, deep peroneal, and superficial peroneal nerve distributions Foot warm and well perfused   Patient name: Brenda Gibson Patient MRN: [id number] Date: 10/29/22

## 2022-10-29 NOTE — Evaluation (Signed)
Occupational Therapy Evaluation Patient Details Name: Brenda Gibson MRN: [id number] DOB: 1941-02-02 Today's Date: 10/29/2022   History of Present Illness 81/F with history of hypertension, tobacco abuse, suspected COPD, osteoporosis presented to the ED after mechanical fall with right hip and thigh pain. Found to have nondisplaced subcapital femoral neck fracture, in addition noted to have oblique fracture of like right pubic bone and displaced oblique fracture of right superior pubic ramus  -12/27 underwent internal fixation of hip with screw placement.   Clinical Impression   Brenda Gibson is a 64 year woman who presents with pain, decreased activity tolerance, impaired balance and history of falling. Evaluation limited. Patient refused any further movement mid transfer to edge of bed due to pain. She is currently requiring a lot of assistance for ADLs at bed level and approximately mod assist for the partial transfer she performed. Patient will benefit from skilled OT services while in hospital to improve deficits and learn compensatory strategies as needed in order to return to PLOF.       Recommendations for follow up therapy are one component of a multi-disciplinary discharge planning process, led by the attending physician.  Recommendations may be updated based on patient status, additional functional criteria and insurance authorization.   Follow Up Recommendations  Skilled nursing-short term rehab (<3 hours/day)     Assistance Recommended at Discharge Frequent or constant Supervision/Assistance  Patient can return home with the following A lot of help with walking and/or transfers;A lot of help with bathing/dressing/bathroom;Assistance with cooking/housework;Help with stairs or ramp for entrance    Functional Status Assessment  Patient has had a recent decline in their functional status and demonstrates the ability to make significant improvements in function in a reasonable  and predictable amount of time.  Equipment Recommendations  Other (comment) (defer)    Recommendations for Other Services       Precautions / Restrictions Precautions Precautions: Fall Restrictions Weight Bearing Restrictions: No RLE Weight Bearing: Weight bearing as tolerated      Mobility Bed Mobility Overal bed mobility: Needs Assistance Bed Mobility: Supine to Sit, Sit to Supine     Supine to sit: Mod assist, +2 for safety/equipment Sit to supine: Mod assist, +2 for safety/equipment   General bed mobility comments: Declined further mobility mid transfer to edge of bed stating "I just need to chill for a minute" -- reports pain limiting movement.    Transfers                   General transfer comment: declined      Balance                                           ADL either performed or assessed with clinical judgement   ADL Overall ADL's : Needs assistance/impaired Eating/Feeding: Bed level;Independent   Grooming: Set up;Bed level   Upper Body Bathing: Minimal assistance;Bed level   Lower Body Bathing: Maximal assistance;Bed level   Upper Body Dressing : Minimal assistance;Bed level   Lower Body Dressing: Total assistance;Bed level       Toileting- Clothing Manipulation and Hygiene: Total assistance;Bed level       Functional mobility during ADLs: Moderate assistance       Vision   Vision Assessment?: No apparent visual deficits     Perception     Praxis  Pertinent Vitals/Pain Pain Assessment Pain Assessment: Faces Faces Pain Scale: Hurts little more Pain Location: R hip Pain Descriptors / Indicators: Grimacing, Guarding Pain Intervention(s): Limited activity within patient's tolerance     Hand Dominance Right   Extremity/Trunk Assessment Upper Extremity Assessment Upper Extremity Assessment: Overall WFL for tasks assessed   Lower Extremity Assessment Lower Extremity Assessment: Defer to PT  evaluation   Cervical / Trunk Assessment Cervical / Trunk Assessment: Normal   Communication Communication Communication: No difficulties   Cognition Arousal/Alertness: Awake/alert Behavior During Therapy: WFL for tasks assessed/performed Overall Cognitive Status: Within Functional Limits for tasks assessed                                       General Comments       Exercises     Shoulder Instructions      Home Living Family/patient expects to be discharged to:: Skilled nursing facility Living Arrangements: Children                               Additional Comments: Patient lives with her son who works during the day.      Prior Functioning/Environment Prior Level of Function : Independent/Modified Independent                        OT Problem List: Decreased activity tolerance;Pain;Impaired balance (sitting and/or standing);Decreased knowledge of use of DME or AE      OT Treatment/Interventions: Self-care/ADL training;Therapeutic exercise;DME and/or AE instruction;Therapeutic activities;Balance training;Patient/family education    OT Goals(Current goals can be found in the care plan section) Acute Rehab OT Goals Patient Stated Goal: did not state Time For Goal Achievement: 11/12/22 Potential to Achieve Goals: Fair  OT Frequency: Min 2X/week    Co-evaluation              AM-PAC OT "6 Clicks" Daily Activity     Outcome Measure Help from another person eating meals?: None Help from another person taking care of personal grooming?: A Little Help from another person toileting, which includes using toliet, bedpan, or urinal?: Total Help from another person bathing (including washing, rinsing, drying)?: A Lot Help from another person to put on and taking off regular upper body clothing?: A Little Help from another person to put on and taking off regular lower body clothing?: Total 6 Click Score: 14   End of Session Nurse  Communication: Mobility status  Activity Tolerance: Patient limited by pain Patient left: in bed;with call bell/phone within reach;with bed alarm set  OT Visit Diagnosis: Pain;History of falling (Z91.81)                Time: 3710-6269 OT Time Calculation (min): 10 min Charges:  OT General Charges $OT Visit: 1 Visit OT Evaluation $OT Eval Low Complexity: 1 Low  Gustavo Lah, OTR/L [place]  Office [phone]   Lenward Chancellor 10/29/2022, 9:32 AM

## 2022-10-29 NOTE — Care Management Important Message (Signed)
Important Message  Patient Details IM Letter given. Name: Brenda Gibson MRN: [id number] Date of Birth: Feb 19, 1941   Medicare Important Message Given:  Yes     Kerin Salen 10/29/2022, 2:13 PM

## 2022-10-29 NOTE — Progress Notes (Signed)
PROGRESS NOTE    Brenda Gibson  OZH:[id number] DOB: 1941-06-17 DOA: 10/25/2022 PCP: Pcp, No  81/F with history of hypertension, tobacco abuse, suspected COPD, osteoporosis presented to the ED after mechanical fall with right hip and thigh pain, fall occurred 5 days prior to admission, had been ambulating at home with a lot of pain.  Imaging in the ED was concerning for nondisplaced subcapital femoral neck fracture, in addition noted to have oblique fracture of like right pubic bone and displaced oblique fracture of right superior pubic ramus -12/27 underwent sliding hip screw placement  Subjective: -Underwent surgery yesterday, denies any complaints  Assessment and Plan:  Closed right hip fracture -Following mechanical fall -Pain control, orthopedics consulted, underwent  sliding hip screw placement by Dr. Laurance Flatten 12/27 -Start Lovenox for DVT prophylaxis, -PT to evaluate today -Incentive spirometry, flutter valve, wean O2 -Labs in a.m.  Pelvic fractures, pubic ramus -Supportive care, PT -Continue laxatives  COPD Tobacco abuse -Counseled -Mild hypoxia now, add incentive spirometry and flutter valve  Hypertension -Started amlodipine  Osteoporosis -Smoking cessation, continue calcium, vitamin D -Follow-up with PCP  Grade 1 diastolic dysfunction -Echo with preserved EF, grade 1 diastolic dysfunction, normal RV  DVT prophylaxis: Lovenox Code Status: Full code Family Communication: Son at bedside Disposition Plan: SNF likely 48 hours  Consultants: Orthopedics   Procedures:   Antimicrobials:    Objective: Vitals:   10/28/22 1311 10/28/22 1754 10/28/22 2202 10/29/22 0541  BP: (!) 133/59 (!) 159/70 138/65 (!) 165/86  Pulse: 65 73 62 (!) 56  Resp: (!) '22 16 17 17  '$ Temp: 98.3 F (36.8 C) 98.2 F (36.8 C) 98.2 F (36.8 C) 98.3 F (36.8 C)  TempSrc: Oral Oral Oral Oral  SpO2: 94% 93% 91% 97%  Weight:      Height:        Intake/Output Summary (Last 24 hours) at  10/29/2022 0844 Last data filed at 10/29/2022 0200 Gross per 24 hour  Intake 1550.67 ml  Output 300 ml  Net 1250.67 ml   Filed Weights   10/25/22 2144 10/27/22 1719  Weight: 54.4 kg 54.4 kg    Examination:  General exam: Chronically ill female appears older than stated age, AAOx3 CVS: S1-S2, regular rhythm Lungs: Poor air movement bilaterally Abdomen: Soft, nontender, bowel sounds present Extremities: No edema, right hip with dressing  Skin: No rashes Psychiatry:  Mood & affect appropriate.     Data Reviewed:   CBC: Recent Labs  Lab 10/26/22 0857 10/27/22 0425 10/28/22 0828 10/29/22 0358  WBC 8.8 5.4 12.3* 9.2  NEUTROABS 6.2  --   --   --   HGB 11.7* 10.8* 11.3* 11.3*  HCT 39.4 34.9* 37.4 36.9  MCV 87.9 87.3 88.0 86.8  PLT 317 285 343 629   Basic Metabolic Panel: Recent Labs  Lab 10/26/22 0857 10/27/22 0425 10/28/22 0828 10/29/22 0358  NA 142 140 138 136  K 3.5 3.9 4.2 3.8  CL 102 104 99 98  CO2 32 30 32 34*  GLUCOSE 96 135* 135* 117*  BUN '15 15 19 19  '$ CREATININE 0.62 0.55 0.64 0.53  CALCIUM 9.1 8.6* 8.5* 8.6*  MG 2.0  --   --   --    GFR: Estimated Creatinine Clearance: 49 mL/min (by C-G formula based on SCr of 0.53 mg/dL). Liver Function Tests: Recent Labs  Lab 10/26/22 0857 10/29/22 0358  AST 15 16  ALT 14 9  ALKPHOS 74 70  BILITOT 0.7 0.6  PROT 6.6 5.8*  ALBUMIN  3.5 2.8*   No results for input(s): "LIPASE", "AMYLASE" in the last 168 hours. No results for input(s): "AMMONIA" in the last 168 hours. Coagulation Profile: Recent Labs  Lab 10/26/22 0909  INR 1.0   Cardiac Enzymes: No results for input(s): "CKTOTAL", "CKMB", "CKMBINDEX", "TROPONINI" in the last 168 hours. BNP (last 3 results) No results for input(s): "PROBNP" in the last 8760 hours. HbA1C: No results for input(s): "HGBA1C" in the last 72 hours. CBG: No results for input(s): "GLUCAP" in the last 168 hours. Lipid Profile: No results for input(s): "CHOL", "HDL",  "LDLCALC", "TRIG", "CHOLHDL", "LDLDIRECT" in the last 72 hours. Thyroid Function Tests: No results for input(s): "TSH", "T4TOTAL", "FREET4", "T3FREE", "THYROIDAB" in the last 72 hours. Anemia Panel: No results for input(s): "VITAMINB12", "FOLATE", "FERRITIN", "TIBC", "IRON", "RETICCTPCT" in the last 72 hours. Urine analysis:    Component Value Date/Time   COLORURINE YELLOW 02/08/2022 1047   APPEARANCEUR HAZY (A) 02/08/2022 1047   LABSPEC 1.025 02/08/2022 1047   PHURINE 5.0 02/08/2022 1047   GLUCOSEU NEGATIVE 02/08/2022 1047   HGBUR NEGATIVE 02/08/2022 1047   BILIRUBINUR NEGATIVE 02/08/2022 1047   BILIRUBINUR neg 07/08/2015 1136   KETONESUR 5 (A) 02/08/2022 1047   PROTEINUR 30 (A) 02/08/2022 1047   UROBILINOGEN 0.2 07/08/2015 1136   NITRITE NEGATIVE 02/08/2022 1047   LEUKOCYTESUR LARGE (A) 02/08/2022 1047   Sepsis Labs: '@LABRCNTIP'$ (procalcitonin:4,lacticidven:4)  ) Recent Results (from the past 240 hour(s))  Surgical pcr screen     Status: None   Collection Time: 10/26/22  9:15 PM   Specimen: Nasal Mucosa; Nasal Swab  Result Value Ref Range Status   MRSA, PCR NEGATIVE NEGATIVE Final   Staphylococcus aureus NEGATIVE NEGATIVE Final    Comment: (NOTE) The Xpert SA Assay (FDA approved for NASAL specimens in patients 70 years of age and older), is one component of a comprehensive surveillance program. It is not intended to diagnose infection nor to guide or monitor treatment. Performed at [facility], [place] [address], [place], [place] [address]      Radiology Studies: DG FEMUR, MIN 2 VIEWS RIGHT  Result Date: 10/28/2022 CLINICAL DATA:  ORIF right hip fracture EXAM: RIGHT FEMUR 2 VIEWS COMPARISON:  10/27/1999 23 FINDINGS: Postop changes. Compression screw and lateral plate proximal femur. Right femoral neck fracture in satisfactory alignment. Hardware in satisfactory position Mild degenerative change in the right hip joint with spurring of the femoral head.  No complication identified. IMPRESSION: ORIF right femoral neck fracture. Electronically Signed   By: Franchot Gallo M.D.   On: 10/28/2022 07:43   DG HIP UNILAT WITH PELVIS 2-3 VIEWS RIGHT  Result Date: 10/27/2022 CLINICAL DATA:  Right hip intramedullary nail. EXAM: DG HIP (WITH OR WITHOUT PELVIS) 2-3V RIGHT COMPARISON:  None Available. FINDINGS: Multiple intraoperative fluoroscopic images for right femoral neck intramedullary nail placement. Total fluoroscopic time was 1 minute 4 seconds and fluoroscopic dose was 11.043 mGy. IMPRESSION: Multiple intraoperative fluoroscopic images for right femoral neck intramedullary nail placement. Electronically Signed   By: Keane Police D.O.   On: 10/27/2022 23:45   DG C-Arm 1-60 Min-No Report  Result Date: 10/27/2022 Fluoroscopy was utilized by the requesting physician.  No radiographic interpretation.   DG C-Arm 1-60 Min-No Report  Result Date: 10/27/2022 Fluoroscopy was utilized by the requesting physician.  No radiographic interpretation.   ECHOCARDIOGRAM COMPLETE  Result Date: 10/27/2022    ECHOCARDIOGRAM REPORT   Patient Name:   BEVERLY FERNER Date of Exam: 10/27/2022 Medical Rec #:  [id number]  Height:       65.0 in Accession #:    [id number]      Weight:       120.0 lb Date of Birth:  1941-06-07        BSA:          1.592 m Patient Age:    81 years        BP:           145/65 mmHg Patient Gender: F               HR:           68 bpm. Exam Location:  Inpatient Procedure: 2D Echo Indications:    Abnormal ECG; Grade 1 Diastolic Dysfunction  History:        Patient has prior history of Echocardiogram examinations, most                 recent 05/27/2021. COPD; Risk Factors:Hypertension.  Sonographer:    Harvie Junior Referring Phys: [id number] [place]  Sonographer Comments: Technically difficult study due to poor echo windows. Image acquisition challenging due to respiratory motion and Image acquisition challenging due to COPD. IMPRESSIONS  1.  Difficult study due to poor echo windows.  2. Left ventricular ejection fraction, by estimation, is 65 to 70%. The left ventricle has normal function. The left ventricle has no regional wall motion abnormalities. There is mild concentric left ventricular hypertrophy. Left ventricular diastolic parameters are consistent with Grade I diastolic dysfunction (impaired relaxation).  3. Right ventricular systolic function is normal. The right ventricular size is not well visualized. There is mildly elevated pulmonary artery systolic pressure. The estimated right ventricular systolic pressure is 31.4 mmHg.  4. The mitral valve is grossly normal. Trivial mitral valve regurgitation. No evidence of mitral stenosis.  5. The aortic valve is tricuspid. There is mild calcification of the aortic valve. There is mild thickening of the aortic valve. Aortic valve regurgitation is not visualized. Aortic valve sclerosis/calcification is present, without any evidence of aortic stenosis.  6. The inferior vena cava is dilated in size with >50% respiratory variability, suggesting right atrial pressure of 8 mmHg. Comparison(s): No significant change from prior study. FINDINGS  Left Ventricle: Left ventricular ejection fraction, by estimation, is 65 to 70%. The left ventricle has normal function. The left ventricle has no regional wall motion abnormalities. The left ventricular internal cavity size was normal in size. There is  mild concentric left ventricular hypertrophy. Left ventricular diastolic parameters are consistent with Grade I diastolic dysfunction (impaired relaxation). Right Ventricle: The right ventricular size is not well visualized. Right vetricular wall thickness was not well visualized. Right ventricular systolic function is normal. There is mildly elevated pulmonary artery systolic pressure. The tricuspid regurgitant velocity is 2.96 m/s, and with an assumed right atrial pressure of 3 mmHg, the estimated right ventricular  systolic pressure is 97.0 mmHg. Left Atrium: Left atrial size was normal in size. Right Atrium: Right atrial size was not well visualized. Pericardium: There is no evidence of pericardial effusion. Mitral Valve: The mitral valve is grossly normal. There is mild thickening of the mitral valve leaflet(s). There is mild calcification of the mitral valve leaflet(s). Trivial mitral valve regurgitation. No evidence of mitral valve stenosis. Tricuspid Valve: The tricuspid valve is normal in structure. Tricuspid valve regurgitation is trivial. Aortic Valve: The aortic valve is tricuspid. There is mild calcification of the aortic valve. There is mild thickening of the aortic valve. Aortic valve regurgitation  is not visualized. Aortic valve sclerosis/calcification is present, without any evidence of aortic stenosis. Pulmonic Valve: The pulmonic valve was not well visualized. Pulmonic valve regurgitation is not visualized. Aorta: The aortic root is normal in size and structure. Venous: The inferior vena cava is dilated in size with greater than 50% respiratory variability, suggesting right atrial pressure of 8 mmHg. IAS/Shunts: The atrial septum is grossly normal.  LEFT VENTRICLE PLAX 2D LVIDd:         3.50 cm     Diastology LVIDs:         2.40 cm     LV e' medial:    4.57 cm/s LV PW:         1.00 cm     LV E/e' medial:  15.1 LV IVS:        1.00 cm     LV e' lateral:   4.79 cm/s LVOT diam:     1.90 cm     LV E/e' lateral: 14.4 LV SV:         71 LV SV Index:   45 LVOT Area:     2.84 cm  LV Volumes (MOD) LV vol d, MOD A2C: 36.7 ml LV vol d, MOD A4C: 50.4 ml LV vol s, MOD A2C: 15.3 ml LV vol s, MOD A4C: 19.7 ml LV SV MOD A2C:     21.4 ml LV SV MOD A4C:     50.4 ml LV SV MOD BP:      26.9 ml RIGHT VENTRICLE RV S prime:     22.20 cm/s TAPSE (M-mode): 2.9 cm LEFT ATRIUM             Index        RIGHT ATRIUM          Index LA diam:        2.70 cm 1.70 cm/m   RA Area:     6.16 cm LA Vol (A2C):   20.8 ml 13.06 ml/m  RA Volume:    10.10 ml 6.34 ml/m LA Vol (A4C):   34.6 ml 21.73 ml/m LA Biplane Vol: 29.0 ml 18.21 ml/m  AORTIC VALVE             PULMONIC VALVE LVOT Vmax:   125.00 cm/s PV Vmax:       0.94 m/s LVOT Vmean:  76.700 cm/s PV Peak grad:  3.5 mmHg LVOT VTI:    0.250 m  AORTA Ao Root diam: 3.30 cm MITRAL VALVE                TRICUSPID VALVE MV Area (PHT): 2.87 cm     TR Peak grad:   35.0 mmHg MV Decel Time: 264 msec     TR Vmax:        296.00 cm/s MV E velocity: 69.00 cm/s MV A velocity: 100.00 cm/s  SHUNTS MV E/A ratio:  0.69         Systemic VTI:  0.25 m                             Systemic Diam: 1.90 cm Gwyndolyn Kaufman MD Electronically signed by Gwyndolyn Kaufman MD Signature Date/Time: 10/27/2022/11:07:35 AM    Final      Scheduled Meds:  amLODipine  5 mg Oral Daily   enoxaparin (LOVENOX) injection  40 mg Subcutaneous Q24H   magnesium oxide  400 mg Oral BID   polyethylene glycol  17 g Oral Daily  senna-docusate  1 tablet Oral BID   Continuous Infusions:   LOS: 3 days    Time spent: 78mn   PDomenic Polite MD [facility]   10/29/2022, 8:44 AM

## 2022-10-30 DIAGNOSIS — S72001A Fracture of unspecified part of neck of right femur, initial encounter for closed fracture: Secondary | ICD-10-CM | POA: Diagnosis not present

## 2022-10-30 LAB — CBC
HCT: 37.7 % (ref 36.0–46.0)
Hemoglobin: 11.6 g/dL — ABNORMAL LOW (ref 12.0–15.0)
MCH: 26.4 pg (ref 26.0–34.0)
MCHC: 30.8 g/dL (ref 30.0–36.0)
MCV: 85.9 fL (ref 80.0–100.0)
Platelets: 375 10*3/uL (ref 150–400)
RBC: 4.39 MIL/uL (ref 3.87–5.11)
RDW: 14.6 % (ref 11.5–15.5)
WBC: 13 10*3/uL — ABNORMAL HIGH (ref 4.0–10.5)
nRBC: 0 % (ref 0.0–0.2)

## 2022-10-30 MED ORDER — PROCHLORPERAZINE EDISYLATE 10 MG/2ML IJ SOLN
10.0000 mg | Freq: Four times a day (QID) | INTRAMUSCULAR | Status: DC | PRN
  Administered 2022-10-30: 10 mg via INTRAVENOUS
  Filled 2022-10-30: qty 2

## 2022-10-30 MED ORDER — BISACODYL 10 MG RE SUPP
10.0000 mg | Freq: Once | RECTAL | Status: AC
  Administered 2022-10-30: 10 mg via RECTAL
  Filled 2022-10-30: qty 1

## 2022-10-30 MED ORDER — DOCUSATE SODIUM 100 MG PO CAPS
100.0000 mg | ORAL_CAPSULE | Freq: Every day | ORAL | Status: DC | PRN
  Administered 2022-10-30: 100 mg via ORAL
  Filled 2022-10-30: qty 1

## 2022-10-30 MED ORDER — HYDRALAZINE HCL 20 MG/ML IJ SOLN
5.0000 mg | Freq: Four times a day (QID) | INTRAMUSCULAR | Status: AC | PRN
  Administered 2022-10-30: 5 mg via INTRAVENOUS
  Filled 2022-10-30: qty 1

## 2022-10-30 MED ORDER — SORBITOL 70 % SOLN
300.0000 mL | TOPICAL_OIL | Freq: Once | ORAL | Status: AC
  Administered 2022-10-30: 300 mL via RECTAL
  Filled 2022-10-30: qty 90

## 2022-10-30 MED ORDER — LACTULOSE 10 GM/15ML PO SOLN
20.0000 g | Freq: Two times a day (BID) | ORAL | Status: AC
  Administered 2022-10-30 (×2): 20 g via ORAL
  Filled 2022-10-30 (×2): qty 30

## 2022-10-30 MED ORDER — SODIUM CHLORIDE 0.9 % IV SOLN: INTRAVENOUS | Status: DC

## 2022-10-30 MED ORDER — SENNOSIDES-DOCUSATE SODIUM 8.6-50 MG PO TABS
2.0000 | ORAL_TABLET | Freq: Two times a day (BID) | ORAL | Status: DC
  Administered 2022-10-30 (×2): 2 via ORAL
  Filled 2022-10-30 (×2): qty 2

## 2022-10-30 NOTE — Plan of Care (Signed)
  Problem: Education: Goal: Knowledge of General Education information will improve Description Including pain rating scale, medication(s)/side effects and non-pharmacologic comfort measures Outcome: Progressing   

## 2022-10-30 NOTE — Progress Notes (Signed)
Pt experienced N/V X2 this shift. PRN zofran initially given and effective. Pt again Pt abdomen firm and distended. Pt denies andy pain or tenderness. Provider notified. New orders for compazine and colace received.

## 2022-10-30 NOTE — Progress Notes (Signed)
PROGRESS NOTE    Brenda Gibson  EHU:[id number] DOB: Aug 22, 1941 DOA: 10/25/2022 PCP: Pcp, No  81/F with history of hypertension, tobacco abuse, suspected COPD, osteoporosis presented to the ED after mechanical fall with right hip and thigh pain, fall occurred 5 days prior to admission, had been ambulating at home with a lot of pain.  Imaging in the ED was concerning for nondisplaced subcapital femoral neck fracture, in addition noted to have oblique fracture of like right pubic bone and displaced oblique fracture of right superior pubic ramus -12/27 underwent sliding hip screw placement -12/30: Abdominal distention with mild nausea  Subjective: -Nauseated this morning, has not had a bowel movement despite multiple laxatives yesterday  Assessment and Plan:  Closed right hip fracture -Following mechanical fall -Pain control, orthopedics consulted, underwent  sliding hip screw placement by Dr. Laurance Flatten 12/27 -Lovenox for DVT prophylaxis -Worked a bit with PT yesterday, SNF recommended -Incentive spirometry, flutter valve, wean O2  Constipation,?  Ileus -Downgrade diet to clears, add IV fluids, continue Senokot, MiraLAX, add lactulose, suppository -Will consider enema this afternoon if needed -If she develops vomiting, may need NG decompression  Pelvic fractures, pubic ramus -Supportive care, PT -Continue laxatives  COPD Tobacco abuse -Counseled -Mild hypoxia now, add incentive spirometry and flutter valve  Hypertension -Started amlodipine  Osteoporosis -Smoking cessation, continue calcium, vitamin D -Follow-up with PCP  Grade 1 diastolic dysfunction -Echo with preserved EF, grade 1 diastolic dysfunction, normal RV  DVT prophylaxis: Lovenox Code Status: Full code Family Communication: Son at bedside Disposition Plan: SNF likely 48 hours  Consultants: Orthopedics   Procedures:  Procedure: Open reduction internal fixation right femoral neck fracture with hip screw    Date of procedure: 10/27/2022  Antimicrobials:    Objective: Vitals:   10/30/22 0158 10/30/22 0500 10/30/22 0538 10/30/22 0754  BP:  (!) 158/80  (!) 162/80  Pulse:  80  79  Resp:  18  20  Temp:  98.2 F (36.8 C)  98.3 F (36.8 C)  TempSrc:  Oral  Oral  SpO2: 91% (!) 86% 90% 92%  Weight:      Height:        Intake/Output Summary (Last 24 hours) at 10/30/2022 0852 Last data filed at 10/30/2022 7858 Gross per 24 hour  Intake 410 ml  Output 500 ml  Net -90 ml   Filed Weights   10/25/22 2144 10/27/22 1719  Weight: 54.4 kg 54.4 kg    Examination:  General exam: Chronically ill female appears older than stated age, AAOx3 CVS: S1-S2, regular rhythm Lungs: Poor air movement bilaterally Abdomen: Firm, mildly distended, bowel sounds decreased Extremities: Right hip with dressing c Skin: No rashes Psychiatry: Flat affect    Data Reviewed:   CBC: Recent Labs  Lab 10/26/22 0857 10/27/22 0425 10/28/22 0828 10/29/22 0358 10/30/22 0405  WBC 8.8 5.4 12.3* 9.2 13.0*  NEUTROABS 6.2  --   --   --   --   HGB 11.7* 10.8* 11.3* 11.3* 11.6*  HCT 39.4 34.9* 37.4 36.9 37.7  MCV 87.9 87.3 88.0 86.8 85.9  PLT 317 285 343 328 850   Basic Metabolic Panel: Recent Labs  Lab 10/26/22 0857 10/27/22 0425 10/28/22 0828 10/29/22 0358  NA 142 140 138 136  K 3.5 3.9 4.2 3.8  CL 102 104 99 98  CO2 32 30 32 34*  GLUCOSE 96 135* 135* 117*  BUN '15 15 19 19  '$ CREATININE 0.62 0.55 0.64 0.53  CALCIUM 9.1 8.6* 8.5* 8.6*  MG  2.0  --   --   --    GFR: Estimated Creatinine Clearance: 49 mL/min (by C-G formula based on SCr of 0.53 mg/dL). Liver Function Tests: Recent Labs  Lab 10/26/22 0857 10/29/22 0358  AST 15 16  ALT 14 9  ALKPHOS 74 70  BILITOT 0.7 0.6  PROT 6.6 5.8*  ALBUMIN 3.5 2.8*   No results for input(s): "LIPASE", "AMYLASE" in the last 168 hours. No results for input(s): "AMMONIA" in the last 168 hours. Coagulation Profile: Recent Labs  Lab 10/26/22 0909  INR  1.0   Cardiac Enzymes: No results for input(s): "CKTOTAL", "CKMB", "CKMBINDEX", "TROPONINI" in the last 168 hours. BNP (last 3 results) No results for input(s): "PROBNP" in the last 8760 hours. HbA1C: No results for input(s): "HGBA1C" in the last 72 hours. CBG: No results for input(s): "GLUCAP" in the last 168 hours. Lipid Profile: No results for input(s): "CHOL", "HDL", "LDLCALC", "TRIG", "CHOLHDL", "LDLDIRECT" in the last 72 hours. Thyroid Function Tests: No results for input(s): "TSH", "T4TOTAL", "FREET4", "T3FREE", "THYROIDAB" in the last 72 hours. Anemia Panel: No results for input(s): "VITAMINB12", "FOLATE", "FERRITIN", "TIBC", "IRON", "RETICCTPCT" in the last 72 hours. Urine analysis:    Component Value Date/Time   COLORURINE YELLOW 02/08/2022 1047   APPEARANCEUR HAZY (A) 02/08/2022 1047   LABSPEC 1.025 02/08/2022 1047   PHURINE 5.0 02/08/2022 1047   GLUCOSEU NEGATIVE 02/08/2022 1047   HGBUR NEGATIVE 02/08/2022 1047   BILIRUBINUR NEGATIVE 02/08/2022 1047   BILIRUBINUR neg 07/08/2015 1136   KETONESUR 5 (A) 02/08/2022 1047   PROTEINUR 30 (A) 02/08/2022 1047   UROBILINOGEN 0.2 07/08/2015 1136   NITRITE NEGATIVE 02/08/2022 1047   LEUKOCYTESUR LARGE (A) 02/08/2022 1047   Sepsis Labs: '@LABRCNTIP'$ (procalcitonin:4,lacticidven:4)  ) Recent Results (from the past 240 hour(s))  Surgical pcr screen     Status: None   Collection Time: 10/26/22  9:15 PM   Specimen: Nasal Mucosa; Nasal Swab  Result Value Ref Range Status   MRSA, PCR NEGATIVE NEGATIVE Final   Staphylococcus aureus NEGATIVE NEGATIVE Final    Comment: (NOTE) The Xpert SA Assay (FDA approved for NASAL specimens in patients 16 years of age and older), is one component of a comprehensive surveillance program. It is not intended to diagnose infection nor to guide or monitor treatment. Performed at [facility], [place] [address] [place], [place] [address]      Radiology Studies: No results  found.   Scheduled Meds:  amLODipine  10 mg Oral Daily   bisacodyl  10 mg Rectal Once   enoxaparin (LOVENOX) injection  40 mg Subcutaneous Q24H   lactulose  20 g Oral BID   magnesium oxide  400 mg Oral BID   polyethylene glycol  17 g Oral Daily   senna-docusate  2 tablet Oral BID   Continuous Infusions:  sodium chloride      LOS: 4 days    Time spent: 65mn   PDomenic Polite MD [facility]   10/30/2022, 8:52 AM

## 2022-10-31 ENCOUNTER — Inpatient Hospital Stay (HOSPITAL_COMMUNITY): Payer: Medicare Other

## 2022-10-31 DIAGNOSIS — E43 Unspecified severe protein-calorie malnutrition: Secondary | ICD-10-CM | POA: Insufficient documentation

## 2022-10-31 DIAGNOSIS — S72001A Fracture of unspecified part of neck of right femur, initial encounter for closed fracture: Secondary | ICD-10-CM | POA: Diagnosis not present

## 2022-10-31 LAB — CBC
HCT: 35.6 % — ABNORMAL LOW (ref 36.0–46.0)
Hemoglobin: 11.1 g/dL — ABNORMAL LOW (ref 12.0–15.0)
MCH: 26.6 pg (ref 26.0–34.0)
MCHC: 31.2 g/dL (ref 30.0–36.0)
MCV: 85.4 fL (ref 80.0–100.0)
Platelets: 360 10*3/uL (ref 150–400)
RBC: 4.17 MIL/uL (ref 3.87–5.11)
RDW: 14.7 % (ref 11.5–15.5)
WBC: 19.7 10*3/uL — ABNORMAL HIGH (ref 4.0–10.5)
nRBC: 0 % (ref 0.0–0.2)

## 2022-10-31 LAB — BASIC METABOLIC PANEL
Anion gap: 7 (ref 5–15)
BUN: 31 mg/dL — ABNORMAL HIGH (ref 8–23)
CO2: 34 mmol/L — ABNORMAL HIGH (ref 22–32)
Calcium: 8.9 mg/dL (ref 8.9–10.3)
Chloride: 95 mmol/L — ABNORMAL LOW (ref 98–111)
Creatinine, Ser: 0.58 mg/dL (ref 0.44–1.00)
GFR, Estimated: >60 mL/min (ref >60)
Glucose, Bld: 145 mg/dL — ABNORMAL HIGH (ref 70–99)
Potassium: 3.8 mmol/L (ref 3.5–5.1)
Sodium: 136 mmol/L (ref 135–145)

## 2022-10-31 MED ORDER — SIMETHICONE 80 MG PO CHEW
80.0000 mg | CHEWABLE_TABLET | Freq: Four times a day (QID) | ORAL | Status: DC
  Administered 2022-10-31: 80 mg via ORAL
  Filled 2022-10-31: qty 1

## 2022-10-31 MED ORDER — MORPHINE SULFATE (PF) 2 MG/ML IV SOLN
2.0000 mg | INTRAVENOUS | Status: DC | PRN
  Administered 2022-11-01: 2 mg via INTRAVENOUS
  Filled 2022-10-31: qty 1

## 2022-10-31 MED ORDER — LACTATED RINGERS IV SOLN: INTRAVENOUS | Status: DC

## 2022-10-31 MED ORDER — SIMETHICONE 80 MG PO CHEW
80.0000 mg | CHEWABLE_TABLET | Freq: Four times a day (QID) | ORAL | Status: DC
  Administered 2022-10-31 – 2022-11-03 (×11): 80 mg via ORAL
  Filled 2022-10-31 (×11): qty 1

## 2022-10-31 MED ORDER — POLYETHYLENE GLYCOL 3350 17 G PO PACK
17.0000 g | PACK | Freq: Every day | ORAL | Status: DC
  Administered 2022-10-31 – 2022-11-03 (×4): 17 g via ORAL
  Filled 2022-10-31 (×4): qty 1

## 2022-10-31 MED ORDER — SENNOSIDES-DOCUSATE SODIUM 8.6-50 MG PO TABS
1.0000 | ORAL_TABLET | Freq: Two times a day (BID) | ORAL | Status: DC
  Administered 2022-10-31 – 2022-11-01 (×3): 1 via ORAL
  Filled 2022-10-31 (×3): qty 1

## 2022-10-31 MED ORDER — PANTOPRAZOLE SODIUM 40 MG PO TBEC
40.0000 mg | DELAYED_RELEASE_TABLET | Freq: Every day | ORAL | Status: DC
  Administered 2022-10-31 – 2022-11-08 (×9): 40 mg via ORAL
  Filled 2022-10-31 (×9): qty 1

## 2022-10-31 MED ORDER — IOHEXOL 300 MG/ML  SOLN: 100.0000 mL | Freq: Once | INTRAMUSCULAR | Status: DC | PRN

## 2022-10-31 MED ORDER — PANTOPRAZOLE SODIUM 40 MG IV SOLR
40.0000 mg | Freq: Every day | INTRAVENOUS | Status: DC
  Filled 2022-10-31 (×2): qty 10

## 2022-10-31 MED ORDER — HYDRALAZINE HCL 20 MG/ML IJ SOLN: 10.0000 mg | INTRAMUSCULAR | Status: DC | PRN

## 2022-10-31 NOTE — Progress Notes (Signed)
[facility] Progress Note Patient: Brenda Gibson KKX:[id number] DOB: 1941-04-13 DOA: 10/25/2022  DOS: the patient was seen and examined on 10/31/2022  Brief hospital course: 81/F with history of hypertension, tobacco abuse, suspected COPD, osteoporosis presented to the ED after mechanical fall with right hip and thigh pain, fall occurred 5 days prior to admission, had been ambulating at home with a lot of pain.  Imaging in the ED was concerning for nondisplaced subcapital femoral neck fracture, in addition noted to have oblique fracture of like right pubic bone and displaced oblique fracture of right superior pubic ramus -12/27 underwent sliding hip screw placement -12/30: Abdominal distention with mild nausea Assessment and Plan: Closed right hip fracture. Following mechanical fall. Underwent hip sliding screw placement on 12/27. Lovenox for DVT prophylaxis. WBAT. Continue pain control.  Partial small bowel obstructions versus ileus with severe constipation. Concern for right inguinal fluid collection. Patient had an x-ray which showed concern for possible bowel obstruction. A CT was performed although unable to perform with contrast due to IV being infiltrated. Discussed with general surgery recommend conservative management. Will continue with clear liquid diet but If she has vomiting will require NG decompression. Due to elevated leukocytosis I would avoid any enema for now.  Pelvic fracture and pubic rami fracture. Continue pain control. Monitor.  COPD. Mild hypoxia. Bilateral pleural effusion. Continue incentive spirometry. If remains hypoxic may require thoracentesis.  HTN. Blood pressures medication currently on hold.  History of osteoporosis. Follows up with PCP.  Grade 1 diastolic dysfunction. Does not appear to be volume overloaded right now but does have this bilateral pleural effusion will monitor.  Leukocytosis. Likely from stress reaction to partial  bowel obstruction. If worsening will formally consult surgery.  Severe protein calorie malnutrition. Body mass index is 19.96 kg/m. Nutrition Problem: Severe Malnutrition Etiology: chronic illness Nutrition Interventions: Interventions: Magic cup, Refer to RD note for recommendations    Subjective: Reports nausea without any vomiting but reports abdominal distention but no abdominal pain.  No chest pain.  No cough.  Physical Exam: General: in Mild distress, No Rash Cardiovascular: S1 and S2 Present, No Murmur Respiratory: Good respiratory effort, Bilateral Air entry present. Occasional  Crackles, No wheezes Abdomen: Bowel Sound present, distended, no tenderness Extremities: No edema Neuro: Alert and oriented x3, no new focal deficit  Data Reviewed: I have Reviewed nursing notes, Vitals, and Lab results. Since last encounter, pertinent lab results CBC and BMP   . I have ordered test including CBC and BMP  . I have discussed pt's care plan and test results with general surgery  . I have ordered imaging CT abdomen  .   Disposition: Status is: Inpatient Remains inpatient appropriate because: Monitor for resolution of partial obstruction/constipation  enoxaparin (LOVENOX) injection 40 mg Start: 10/28/22 1300 SCDs Start: 10/26/22 1029   Family Communication: Son at bedside Level of care: [place]. Vitals:   10/30/22 2230 10/31/22 0018 10/31/22 0421 10/31/22 0455  BP: (!) 167/84 (!) 151/78 (!) 175/82 (!) 160/74  Pulse:  73 83   Resp:  18 16   Temp:  98.2 F (36.8 C) 98.1 F (36.7 C)   TempSrc:  Oral Oral   SpO2:  92% 92%   Weight:      Height:         Author: Berle Mull, MD 10/31/2022 6:03 PM  Please look on [email or web address] to find out who is on call.

## 2022-10-31 NOTE — Consult Note (Signed)
Reason for Consult:? hernia Referring Physician: Dr Philipp Ovens is an 81 y.o. female.  HPI: 44 yof with htn, copd, smoking history had a fall with femur fx and pubic rami fx.  Had a IM nail.  She does have a history of elap in 2017 for lysis of adhesions.  She on 12/30 began having distention with some nausea.  This worsened and today she underwent imaging that showed dilated sb loops (have been seen before) with possibility of obstruction but no transition. There was a small fluid collection left inguinal canal that questions hernia and I was called to see her.. She is comfortable, has some nausea, having flatus, no ab pain when I see her.   Past Medical History:  Diagnosis Date   Basal cell carcinoma of ala nasi    Cataract    COVID-19 virus infection 05/22/2021   Essential hypertension, benign 06/05/2013   Mild, progressive hypertension. Isolated systolic hypertension.   Serial BPs:  --08/02/13: 130/80, recent measurements from home range from 137/73 to 149/75 with higher measurement being when skipping furosemide due to overactive bladder symptoms --06/05/13: 150/86 --02/22/13: 118/60   Hyperglycemia 05/22/2021   Hypertension    Knee pain, bilateral    Measles    Myalgia    Osteoporosis 08/12/2014   Seborrheic dermatitis of scalp    Small bowel obstruction ([place])    Tobacco abuse 02/24/2011   Varicella     Past Surgical History:  Procedure Laterality Date   BASAL CELL CARCINOMA EXCISION     '11 ([place])   CATARACT EXTRACTION W/ INTRAOCULAR LENS IMPLANT     right eye. '09 (Dr. Dolores Lory)   INTRAMEDULLARY (IM) NAIL INTERTROCHANTERIC Right 10/27/2022   Procedure: INTRAMEDULLARY (IM) NAIL INTERTROCHANTERIC;  Surgeon: Callie Fielding, MD;  Location: [facility];  Service: Orthopedics;  Laterality: Right;   LAPAROTOMY N/A 06/25/2016   Procedure: EXPLORATORY LAPAROTOMY, lysis of adhesion;  Surgeon: Coralie Keens, MD;  Location: [facility];  Service: General;  Laterality: N/A;   LYSIS OF  ADHESION  06/25/2016   Dr Nedra Hai    Family History  Problem Relation Age of Onset   Alcohol abuse Mother    Cancer Father 36       stomach cancer   Prostate cancer Father    Hypertension Maternal Grandfather    Diabetes Neg Hx    Heart disease Neg Hx     Social History:  reports that she has been smoking cigarettes. She has a 31.20 pack-year smoking history. She has never used smokeless tobacco. She reports current alcohol use. She reports that she does not use drugs.  Allergies: No Known Allergies  Current Facility-Administered Medications  Medication Dose Route Frequency Provider Last Rate Last Admin   acetaminophen (TYLENOL) tablet 650 mg  650 mg Oral Q6H PRN Callie Fielding, MD   650 mg at 10/30/22 1146   Or   acetaminophen (TYLENOL) suppository 650 mg  650 mg Rectal Q6H PRN Callie Fielding, MD       albuterol (PROVENTIL) (2.5 MG/3ML) 0.083% nebulizer solution 3 mL  3 mL Inhalation Q4H PRN Callie Fielding, MD       enoxaparin (LOVENOX) injection 40 mg  40 mg Subcutaneous Q24H Domenic Polite, MD   40 mg at 10/31/22 1539   hydrALAZINE (APRESOLINE) injection 10 mg  10 mg Intravenous Q4H PRN Lavina Hamman, MD       iohexol (OMNIPAQUE) 300 MG/ML solution 100 mL  100 mL Intravenous Once PRN  Lavina Hamman, MD       lactated ringers infusion   Intravenous Continuous Lavina Hamman, MD   Stopped at 10/31/22 1425   morphine (PF) 2 MG/ML injection 2-4 mg  2-4 mg Intravenous Q2H PRN Lavina Hamman, MD       nicotine (NICODERM CQ - dosed in mg/24 hours) patch 14 mg  14 mg Transdermal Daily PRN Callie Fielding, MD   14 mg at 10/26/22 1505   ondansetron (ZOFRAN) tablet 4 mg  4 mg Oral Q6H PRN Callie Fielding, MD   4 mg at 10/30/22 2230   Or   ondansetron (ZOFRAN) injection 4 mg  4 mg Intravenous Q6H PRN Callie Fielding, MD   4 mg at 10/31/22 1030   pantoprazole (PROTONIX) injection 40 mg  40 mg Intravenous Daily Lavina Hamman, MD       polyethylene glycol (MIRALAX /  GLYCOLAX) packet 17 g  17 g Oral Daily Lavina Hamman, MD       prochlorperazine (COMPAZINE) injection 10 mg  10 mg Intravenous Q6H PRN Raenette Rover, NP   10 mg at 10/30/22 0025   senna-docusate (Senokot-S) tablet 1 tablet  1 tablet Oral BID Lavina Hamman, MD       simethicone [facility]) chewable tablet 80 mg  80 mg Oral QID Lavina Hamman, MD         Results for orders placed or performed during the hospital encounter of 10/25/22 (from the past 48 hour(s))  CBC     Status: Abnormal   Collection Time: 10/30/22  4:05 AM  Result Value Ref Range   WBC 13.0 (H) 4.0 - 10.5 K/uL   RBC 4.39 3.87 - 5.11 MIL/uL   Hemoglobin 11.6 (L) 12.0 - 15.0 g/dL   HCT 37.7 36.0 - 46.0 %   MCV 85.9 80.0 - 100.0 fL   MCH 26.4 26.0 - 34.0 pg   MCHC 30.8 30.0 - 36.0 g/dL   RDW 14.6 11.5 - 15.5 %   Platelets 375 150 - 400 K/uL   nRBC 0.0 0.0 - 0.2 %    Comment: Performed at [facility], [place] [address], [place], [place] [address]  CBC     Status: Abnormal   Collection Time: 10/31/22  3:27 AM  Result Value Ref Range   WBC 19.7 (H) 4.0 - 10.5 K/uL   RBC 4.17 3.87 - 5.11 MIL/uL   Hemoglobin 11.1 (L) 12.0 - 15.0 g/dL   HCT 35.6 (L) 36.0 - 46.0 %   MCV 85.4 80.0 - 100.0 fL   MCH 26.6 26.0 - 34.0 pg   MCHC 31.2 30.0 - 36.0 g/dL   RDW 14.7 11.5 - 15.5 %   Platelets 360 150 - 400 K/uL   nRBC 0.0 0.0 - 0.2 %    Comment: Performed at [facility], [place] [address], [place], [place] [address]  Basic metabolic panel     Status: Abnormal   Collection Time: 10/31/22  3:27 AM  Result Value Ref Range   Sodium 136 135 - 145 mmol/L   Potassium 3.8 3.5 - 5.1 mmol/L   Chloride 95 (L) 98 - 111 mmol/L   CO2 34 (H) 22 - 32 mmol/L   Glucose, Bld 145 (H) 70 - 99 mg/dL    Comment: Glucose reference range applies only to samples taken after fasting for at least 8 hours.   BUN 31 (H) 8 - 23 mg/dL   Creatinine, Ser  0.58 0.44 - 1.00 mg/dL   Calcium 8.9 8.9 - 10.3 mg/dL   GFR,  Estimated >60 >60 mL/min    Comment: (NOTE) Calculated using the CKD-EPI Creatinine Equation (2021)    Anion gap 7 5 - 15    Comment: Performed at [facility], [place] [address], [place], [place] [address]    CT ABDOMEN PELVIS WO CONTRAST  Result Date: 10/31/2022 CLINICAL DATA:  Distension. Abdominal pain. Status post ORIF of right femoral neck fracture 4 days ago. EXAM: CT ABDOMEN AND PELVIS WITHOUT CONTRAST TECHNIQUE: Multidetector CT imaging of the abdomen and pelvis was performed following the standard protocol without IV contrast. RADIATION DOSE REDUCTION: This exam was performed according to the departmental dose-optimization program which includes automated exposure control, adjustment of the mA and/or kV according to patient size and/or use of iterative reconstruction technique. COMPARISON:  Abdomen 10/21/2022. CT of the abdomen and pelvis 05/24/2021. FINDINGS: Lower chest: New moderate bilateral pleural effusions are present. Previously seen 10 mm pleural based nodule in the left lower lobe is stable. No additional follow-up is necessary. New bibasilar airspace opacities are associated with the effusions, likely atelectasis. Heart size is normal. No significant pericardial effusion is present. Marked wall thickening is present about the distal esophagus. Contrast is present in the distal esophagus. No discrete mass lesion is present. Hepatobiliary: Layering gallstones are present. No definite inflammatory scratched at no focal inflammatory changes are present about the gallbladder. Common bile duct is within normal limits. Liver is unremarkable. Pancreas: Unremarkable. No pancreatic ductal dilatation or surrounding inflammatory changes. Spleen: Normal in size without focal abnormality. Adrenals/Urinary Tract: Adrenal glands are normal bilaterally. Kidneys are unremarkable. No stone or mass lesion is present. Ureters are within normal limits bilaterally. Urinary bladder is  dilated. No discrete lesion is present. Stomach/Bowel: The stomach is mildly distended. Duodenum is unremarkable. Proximal loops of small bowel are dilated up to 4 cm with fluid levels. Stool like material is present in the small bowel in the low anterior pelvis. A new 18 mm fluid collection extends into the left inguinal canal. This could represent a small loop of bowel as this is near the transition point. More likely a represents a small amount of fluid extending into the left inguinal canal. The more distal small bowel is collapsed. Gas and stool are present throughout the colon. No focal bowel inflammation is present. No pneumatosis is present. Vascular/Lymphatic: Extensive vascular calcifications are present. No aneurysm is present. No significant adenopathy is present. Reproductive: Uterus is deviated to the right. No focal lesions are present. No free air is present. Other: No other significant ventral hernia is present. No free air is present. Musculoskeletal: No acute or significant osseous findings. IMPRESSION: 1. Multiple dilated loops of small bowel is seen on prior studies. More normal caliber distal small bowel suggests at least a partial small bowel obstruction. 2. 18 mm fluid collection in the left inguinal canal may represent focal ascites. Although incarcerated hernia is not excluded, bowel does not seem to dilate up to this point in this seems less likely. 3. Stool like material within small bowel in the low anterior pelvis suggests slow transit and may be near the transition point. 4. New moderate bilateral pleural effusions with associated bibasilar airspace opacities, likely atelectasis. 5. Marked wall thickening about the distal esophagus compatible with esophagitis. No definite mass lesion is present. Recommend follow-up endoscopy. 6. Cholelithiasis without evidence for cholecystitis. 7. Stable 10 mm pleural based nodule in the left lower lobe.  No additional follow-up is necessary. These  results were called by telephone at the time of interpretation on 10/31/2022 at 3:24 pm to provider [facility] PATEL , who verbally acknowledged these results. Electronically Signed   By: San Morelle M.D.   On: 10/31/2022 15:28   DG Abd Portable 1V  Result Date: 10/31/2022 CLINICAL DATA:  81 year old female postoperative day 4 right hip surgery. Constipation. EXAM: PORTABLE ABDOMEN - 1 VIEW COMPARISON:  Abdominal radiographs 05/28/2021 and earlier. FINDINGS: Portable AP supine view at 0814 hours. Proximal right femur ORIF with proximal dynamic hip screw. Lung bases appear negative. Widespread gas-filled small and large bowel loops throughout the abdomen and pelvis, although appearance not significantly changed from radiographs last year. Mild to moderate volume of retained stool also similar to prior studies. IMPRESSION: Widespread gas-filled bowel loops throughout the abdomen, but not significantly changed from last year. Top differential considerations include chronic decreased bowel motility versus postoperative ileus. Electronically Signed   By: Genevie Ann M.D.   On: 10/31/2022 11:30    Review of Systems  Constitutional:  Negative for fever.  Gastrointestinal:  Positive for abdominal distention and nausea. Negative for abdominal pain and vomiting.   Blood pressure (!) 160/74, pulse 83, temperature 98.1 F (36.7 C), temperature source Oral, resp. rate 16, height '5\' 5"'$  (1.651 m), weight 54.4 kg, SpO2 92 %. Physical Exam Vitals reviewed.  Constitutional:      Appearance: Normal appearance.  Eyes:     General: No scleral icterus. Cardiovascular:     Rate and Rhythm: Normal rate and regular rhythm.  Pulmonary:     Effort: Pulmonary effort is normal.  Abdominal:     General: There is distension (mild).     Palpations: Abdomen is soft.     Tenderness: There is no abdominal tenderness.     Hernia: No hernia (no left inguinal hernia) is present.    Skin:    General: Skin is warm and dry.      Capillary Refill: Capillary refill takes less than 2 seconds.  Neurological:     General: No focal deficit present.     Mental Status: She is alert.     Assessment/Plan: Likely ileus -would treat conservatively, hold ng for now -bowel regimen -correct electrolytes -oob, pulm toilet  I spen 25 minutes seeing patient, reviewing labs, old chart and ct scan which I reviewed independently.  Discussed with Dr Posey Pronto also.   Rolm Bookbinder 10/31/2022, 6:27 PM

## 2022-10-31 NOTE — Plan of Care (Signed)
  Problem: Education: Goal: Knowledge of General Education information will improve Description Including pain rating scale, medication(s)/side effects and non-pharmacologic comfort measures Outcome: Progressing   

## 2022-10-31 NOTE — Progress Notes (Signed)
Physical Therapy Treatment Patient Details Name: Brenda Gibson MRN: [id number] DOB: [birth year] Today's Date: 10/31/2022   History of Present Illness 81/F with history of hypertension, tobacco abuse, suspected COPD, osteoporosis presented to the ED after mechanical fall with right hip and thigh pain. Found to have nondisplaced subcapital femoral neck fracture, in addition noted to have oblique fracture of like right pubic bone and displaced oblique fracture of right superior pubic ramus  -12/27 underwent internal fixation of hip with screw placement.    PT Comments    POD # 4 General Comments: AxO x 3 very pleasant, following all commands and able to recall past events. Assisted OOB to amb to bathroom required increased time.  General bed mobility comments: increased self ability to rise and transfer to EOB. General transfer comment: 25% VC's on proper hand placement and safety with turns.  Also asissted with a toilet transfer.  Asisst for peri due to balance instability and need to steady self. General Gait Details: 25% VC's on proper walker use (pt not use to using one), proper walker to self distance and safety with turns.  Distance limited by increased c/o fatigue and pain.  Amb to bathroom as well as in hallway. Positioned in recliner to comfort. Pt lives home with Son and was Indep.  Pt will need ST Rehab at SNF to address mobility and functional decline prior to safely returning home.  Son agrees.    Recommendations for follow up therapy are one component of a multi-disciplinary discharge planning process, led by the attending physician.  Recommendations may be updated based on patient status, additional functional criteria and insurance authorization.  Follow Up Recommendations  Skilled nursing-short term rehab (<3 hours/day) Can patient physically be transported by private vehicle: No   Assistance Recommended at Discharge Frequent or constant Supervision/Assistance  Patient can return  home with the following A lot of help with walking and/or transfers;A lot of help with bathing/dressing/bathroom;Assistance with cooking/housework;Assist for transportation;Help with stairs or ramp for entrance   Equipment Recommendations  None recommended by PT    Recommendations for Other Services       Precautions / Restrictions Precautions Precautions: Fall Restrictions Weight Bearing Restrictions: No RLE Weight Bearing: Weight bearing as tolerated     Mobility  Bed Mobility Overal bed mobility: Needs Assistance Bed Mobility: Supine to Sit     Supine to sit: Supervision, Min guard     General bed mobility comments: increased self ability to rise and transfer to EOB.    Transfers Overall transfer level: Needs assistance Equipment used: Rolling walker (2 wheels) Transfers: Sit to/from Stand Sit to Stand: Min assist           General transfer comment: 25% VC's on proper hand placement and safety with turns.  Also asissted with a toilet transfer.  Asisst for peri due to balance instability and need to steady self.    Ambulation/Gait Ambulation/Gait assistance: Min assist, Mod assist Gait Distance (Feet): 32 Feet Assistive device: Rolling walker (2 wheels) Gait Pattern/deviations: Decreased step length - right, Decreased step length - left, Shuffle Gait velocity: decreased     General Gait Details: 25% VC's on proper walker use (pt not use to using one), proper walker to self distance and safety with turns.  Distance limited by increased c/o fatigue and pain.  Amb to bathroom as well as in hallway.   Stairs             Wheelchair Mobility    Modified Rankin (  Stroke Patients Only)       Balance                                            Cognition Arousal/Alertness: Awake/alert Behavior During Therapy: WFL for tasks assessed/performed Overall Cognitive Status: Within Functional Limits for tasks assessed                                  General Comments: AxO x 3 very pleasant, following all commands and able to recall past events.        Exercises      General Comments        Pertinent Vitals/Pain Pain Assessment Pain Assessment: Faces Faces Pain Scale: Hurts little more Pain Location: R hip Pain Descriptors / Indicators: Grimacing, Guarding, Operative site guarding Pain Intervention(s): Monitored during session, Premedicated before session, Repositioned, Ice applied    Home Living                          Prior Function            PT Goals (current goals can now be found in the care plan section) Progress towards PT goals: Progressing toward goals    Frequency    Min 3X/week      PT Plan Current plan remains appropriate    Co-evaluation              AM-PAC PT "6 Clicks" Mobility   Outcome Measure  Help needed turning from your back to your side while in a flat bed without using bedrails?: A Little Help needed moving from lying on your back to sitting on the side of a flat bed without using bedrails?: A Little Help needed moving to and from a bed to a chair (including a wheelchair)?: A Little Help needed standing up from a chair using your arms (e.g., wheelchair or bedside chair)?: A Lot Help needed to walk in hospital room?: A Lot Help needed climbing 3-5 steps with a railing? : A Lot 6 Click Score: 15    End of Session Equipment Utilized During Treatment: Gait belt Activity Tolerance: Patient limited by fatigue;Patient limited by pain Patient left: in chair;with call bell/phone within reach;with family/visitor present Nurse Communication: Mobility status PT Visit Diagnosis: Difficulty in walking, not elsewhere classified (R26.2);Pain Pain - Right/Left: Right Pain - part of body: Hip     Time: 1227-1254 PT Time Calculation (min) (ACUTE ONLY): 27 min  Charges:  $Gait Training: 8-22 mins $Therapeutic Activity: 8-22 mins                      Rica Koyanagi  PTA Acute  Rehabilitation Services Office M-F          [phone] Weekend pager [phone]

## 2022-10-31 NOTE — Progress Notes (Signed)
Updated Dr. Posey Pronto, MD to inform that patient had smog enema yesterday around 5 pm and that she was not able to hold much in. Made Dr. Posey Pronto, MD aware that the only BM she had from the enema was mucous. According to night shift, she was not able to hold the tap water enema either. She had a small mushy BM yesterday after the suppository was given. Made Dr. Posey Pronto, MD aware of patient's firm distended abdomen, BM descriptions, and amount. New orders from Dr. Posey Pronto, MD: Abdominal x-ray

## 2022-10-31 NOTE — Progress Notes (Addendum)
Patient getting transported down to CT.

## 2022-11-01 DIAGNOSIS — S72001A Fracture of unspecified part of neck of right femur, initial encounter for closed fracture: Secondary | ICD-10-CM | POA: Diagnosis not present

## 2022-11-01 LAB — BASIC METABOLIC PANEL
Anion gap: 7 (ref 5–15)
BUN: 22 mg/dL (ref 8–23)
CO2: 29 mmol/L (ref 22–32)
Calcium: 8.1 mg/dL — ABNORMAL LOW (ref 8.9–10.3)
Chloride: 95 mmol/L — ABNORMAL LOW (ref 98–111)
Creatinine, Ser: 0.45 mg/dL (ref 0.44–1.00)
GFR, Estimated: >60 mL/min (ref >60)
Glucose, Bld: 94 mg/dL (ref 70–99)
Potassium: 4 mmol/L (ref 3.5–5.1)
Sodium: 131 mmol/L — ABNORMAL LOW (ref 135–145)

## 2022-11-01 LAB — CBC
HCT: 30.7 % — ABNORMAL LOW (ref 36.0–46.0)
Hemoglobin: 9.5 g/dL — ABNORMAL LOW (ref 12.0–15.0)
MCH: 26.7 pg (ref 26.0–34.0)
MCHC: 30.9 g/dL (ref 30.0–36.0)
MCV: 86.2 fL (ref 80.0–100.0)
Platelets: 305 10*3/uL (ref 150–400)
RBC: 3.56 MIL/uL — ABNORMAL LOW (ref 3.87–5.11)
RDW: 14.7 % (ref 11.5–15.5)
WBC: 16.1 10*3/uL — ABNORMAL HIGH (ref 4.0–10.5)
nRBC: 0 % (ref 0.0–0.2)

## 2022-11-01 LAB — LACTIC ACID, PLASMA: Lactic Acid, Venous: 1 mmol/L (ref 0.5–1.9)

## 2022-11-01 LAB — MAGNESIUM: Magnesium: 1.9 mg/dL (ref 1.7–2.4)

## 2022-11-01 MED ORDER — ALUM & MAG HYDROXIDE-SIMETH 200-200-20 MG/5ML PO SUSP
15.0000 mL | Freq: Four times a day (QID) | ORAL | Status: DC | PRN
  Administered 2022-11-01 (×2): 15 mL via ORAL
  Filled 2022-11-01 (×3): qty 30

## 2022-11-01 NOTE — NC FL2 (Signed)
[place] LEVEL OF CARE FORM     IDENTIFICATION  Patient Name: Brenda Gibson Birthdate: 11-04-1940 Sex: female Admission Date (Current Location): 10/25/2022  [facility] and [place] Number:  Herbalist and Address:  [facility],  [place], [place]      Provider Number: [id number]  Attending Physician Name and Address:  Lavina Hamman, MD  Relative Name and Phone Number:  son, [name] @ [phone]    Current Level of Care: Hospital Recommended Level of Care: [place] Prior Approval Number:    Date Approved/Denied:   PASRR Number: [id number]  Discharge Plan: SNF    Current Diagnoses: Patient Active Problem List   Diagnosis Date Noted   Protein-calorie malnutrition, severe 10/31/2022   Closed right hip fracture, initial encounter ([place]) 10/26/2022   Constipation 10/26/2022   Pubic ramus fracture, right, closed ([place]) 10/26/2022   Grade I diastolic dysfunction 67/89/3810   COPD (chronic obstructive pulmonary disease) ([place]) 12/12/2021   Acute respiratory failure with hypoxia ([place]) 12/12/2021   Partial small bowel obstruction ([place]) 05/24/2021   Acute hypoxemic respiratory failure due to COVID-19 ([place]) 05/22/2021   COPD exacerbation ([place]) 05/22/2021   COVID-19 virus infection 05/22/2021   Hyperglycemia 05/22/2021   Closed fracture of left tibial plateau 08/14/2020   Physical deconditioning 07/30/2016   Small bowel obstruction ([facility])    Hypokalemia 07/04/2016   Hypomagnesemia 07/04/2016   Urinary retention 07/04/2016   Ileus, postoperative ([place]) 07/02/2016   Malnutrition of moderate degree 06/28/2016   SBO (small bowel obstruction) s/p lysis of adhesion 06/25/2016 06/21/2016   Enteritis 06/21/2016   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   Elevated blood pressure reading without diagnosis of hypertension 06/19/2016   Medicare annual wellness visit, subsequent 02/23/2016   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   Routine general medical examination at a health care facility 08/12/2014   Osteoporosis 08/12/2014   Overactive bladder 08/02/2013   Essential hypertension, benign 06/05/2013   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   Ganglion cyst of wrist 02/22/2013   Tobacco abuse 02/24/2011    Orientation RESPIRATION BLADDER Height & Weight     Self, Situation, Place, Time  O2 Continent Weight: 119 lb 14.9 oz (54.4 kg) Height:  '5\' 5"'$  (165.1 cm)  BEHAVIORAL SYMPTOMS/MOOD NEUROLOGICAL BOWEL NUTRITION STATUS      Continent Diet  AMBULATORY STATUS COMMUNICATION OF NEEDS Skin   Limited Assist Verbally Other (Comment) (surgical incision only)                       Personal Care Assistance Level of Assistance  Bathing, Dressing Bathing Assistance: Limited assistance   Dressing Assistance: Limited assistance     Functional Limitations Info             SPECIAL CARE FACTORS FREQUENCY  PT (By licensed PT), OT (By licensed OT)     PT Frequency: 5x/wk OT Frequency: 5x/wk            Contractures Contractures Info: Not present    Additional Factors Info  Code Status, Allergies Code Status Info: Full Allergies Info: NKDA           Current Medications (11/01/2022):  This is the current hospital active medication list Current Facility-Administered Medications  Medication Dose Route Frequency Provider Last Rate Last Admin   acetaminophen (TYLENOL) tablet 650 mg  650 mg Oral Q6H PRN Callie Fielding, MD   650 mg at 11/01/22 1207   Or  acetaminophen (TYLENOL) suppository 650 mg  650 mg Rectal Q6H PRN Callie Fielding, MD       albuterol (PROVENTIL) (2.5 MG/3ML) 0.083% nebulizer solution 3 mL  3 mL Inhalation Q4H PRN Callie Fielding, MD       enoxaparin (LOVENOX) injection 40 mg  40 mg Subcutaneous Q24H Domenic Polite, MD   40 mg at 11/01/22 1207   hydrALAZINE (APRESOLINE) injection 10 mg  10 mg Intravenous Q4H PRN Lavina Hamman, MD       lactated ringers infusion    Intravenous Continuous Lavina Hamman, MD 50 mL/hr at 11/01/22 1235 Rate Change at 11/01/22 1235   morphine (PF) 2 MG/ML injection 2-4 mg  2-4 mg Intravenous Q2H PRN Lavina Hamman, MD       nicotine (NICODERM CQ - dosed in mg/24 hours) patch 14 mg  14 mg Transdermal Daily PRN Callie Fielding, MD   14 mg at 10/26/22 1505   ondansetron (ZOFRAN) tablet 4 mg  4 mg Oral Q6H PRN Callie Fielding, MD   4 mg at 10/30/22 2230   Or   ondansetron (ZOFRAN) injection 4 mg  4 mg Intravenous Q6H PRN Callie Fielding, MD   4 mg at 10/31/22 1030   pantoprazole (PROTONIX) EC tablet 40 mg  40 mg Oral Daily Kathryne Eriksson, NP   40 mg at 11/01/22 0835   polyethylene glycol (MIRALAX / GLYCOLAX) packet 17 g  17 g Oral Daily Lavina Hamman, MD   17 g at 11/01/22 0836   prochlorperazine (COMPAZINE) injection 10 mg  10 mg Intravenous Q6H PRN Raenette Rover, NP   10 mg at 10/30/22 0025   senna-docusate (Senokot-S) tablet 1 tablet  1 tablet Oral BID Lavina Hamman, MD   1 tablet at 11/01/22 0835   simethicone (MYLICON) chewable tablet 80 mg  80 mg Oral QID Lavina Hamman, MD   80 mg at 11/01/22 1207     Discharge Medications: Please see discharge summary for a list of discharge medications.  Relevant Imaging Results:  Relevant Lab Results:   Additional Information SSN [id number]  Lennart Pall, LCSW

## 2022-11-01 NOTE — Plan of Care (Signed)
  Problem: Pain Managment: Goal: General experience of comfort will improve Outcome: Progressing   Problem: Elimination: Goal: Will not experience complications related to bowel motility Outcome: Progressing Goal: Will not experience complications related to urinary retention Outcome: Progressing   Problem: Activity: Goal: Risk for activity intolerance will decrease Outcome: Progressing

## 2022-11-01 NOTE — Progress Notes (Signed)
[facility] Progress Note Patient: Brenda Gibson:[id number] DOB: 1941-08-01 DOA: 10/25/2022  DOS: the patient was seen and examined on 11/01/2022  Brief hospital course: 81/F with history of hypertension, tobacco abuse, suspected COPD, osteoporosis presented to the ED after mechanical fall with right hip and thigh pain, fall occurred 5 days prior to admission, had been ambulating at home with a lot of pain.  Imaging in the ED was concerning for nondisplaced subcapital femoral neck fracture, in addition noted to have oblique fracture of like right pubic bone and displaced oblique fracture of right superior pubic ramus -12/27 underwent sliding hip screw placement -12/30: Abdominal distention with mild nausea Assessment and Plan: Closed right hip fracture. Following mechanical fall. Underwent hip sliding screw placement on 12/27. Lovenox for DVT prophylaxis. WBAT. Continue pain control.  Partial small bowel obstructions versus ileus with severe constipation. Concern for right inguinal fluid collection. Patient had an x-ray which showed concern for possible bowel obstruction. A CT was performed although unable to perform with contrast due to IV being infiltrated. Discussed with general surgery recommend conservative management. Advanced to soft diet today. If she has vomiting will require NG decompression. Due to elevated leukocytosis I would avoid any enema for now.  Monitor for improvement.  Repeat x-ray chest and abdomen tomorrow.  Pelvic fracture and pubic rami fracture. Continue pain control. Monitor.  COPD. Mild hypoxia. Bilateral pleural effusion. Continue incentive spirometry. If remains hypoxic may require thoracentesis.  Will repeat chest x-ray tomorrow.  HTN. Blood pressures medication currently on hold.  History of osteoporosis. Follows up with PCP.  Grade 1 diastolic dysfunction. Does not appear to be volume overloaded right now but does have this bilateral  pleural effusion will monitor.  Leukocytosis. Likely from stress reaction to partial bowel obstruction. If worsening will formally consult surgery.  Drowsiness. Patient is drowsy.  Receiving any medication that can cause it. No focal deficit otherwise. Monitor for now.  If does not improve will perform further workup.  Severe protein calorie malnutrition. Body mass index is 19.96 kg/m. Nutrition Problem: Severe Malnutrition Etiology: chronic illness Nutrition Interventions: Interventions: Magic cup, Refer to RD note for recommendations    Subjective: Denies any nausea.  No abdominal pain.  Passing gas.  No BM.  No fever no chills.  Physical Exam: General: in Mild distress, No Rash Cardiovascular: S1 and S2 Present, No Murmur Respiratory: Good respiratory effort, Bilateral Air entry present.  Faint basal crackles, No wheezes Abdomen: Bowel Sound present, distention resolved.  No tenderness Extremities: No edema Neuro: Drowsy but easily awakened and oriented x3, no new focal deficit   Data Reviewed: I have Reviewed nursing notes, Vitals, and Lab results. Since last encounter, pertinent lab results CBC and BMP   . I have ordered test including CBC and BMP  . I have ordered imaging x-ray chest and abdomen  .    Disposition: Status is: Inpatient Remains inpatient appropriate because: Monitor for improvement in mentation and oral intake  enoxaparin (LOVENOX) injection 40 mg Start: 10/28/22 1300 SCDs Start: 10/26/22 1029   Family Communication: Son at bedside Level of care: [place]. Vitals:   10/31/22 0455 10/31/22 2036 11/01/22 0529 11/01/22 1304  BP: (!) 160/74 (!) 119/54 (!) 117/56 114/63  Pulse:  76 66 62  Resp:  '17 17 18  '$ Temp:  98.3 F (36.8 C) 98.7 F (37.1 C) 98.3 F (36.8 C)  TempSrc:  Oral Oral Oral  SpO2:  94% 91% 96%  Weight:  Height:         Author: Berle Mull, MD 11/01/2022 5:59 PM  Please look on [email or web address] to find out  who is on call.

## 2022-11-01 NOTE — Progress Notes (Signed)
Physical Therapy Treatment Patient Details Name: Brenda Gibson MRN: [id number] DOB: 1941-04-26 Today's Date: 11/01/2022   History of Present Illness 81/F with history of hypertension, tobacco abuse, suspected COPD, osteoporosis presented to the ED after mechanical fall with right hip and thigh pain. Found to have nondisplaced subcapital femoral neck fracture, in addition noted to have oblique fracture of like right pubic bone and displaced oblique fracture of right superior pubic ramus  -12/27 underwent internal fixation of hip with screw placement. Pleural effusions.    PT Comments    Pt reports feeling generally unwell and fatigued. Pt ambulated 6' with RW, SpO2 86% on room air while ambulating, 90% on 2L O2 at rest with pursed lip breathing. Ambulation distance limited by fatigue/dyspnea. Pt performed RLE therapeutic exercise with supervision.    Recommendations for follow up therapy are one component of a multi-disciplinary discharge planning process, led by the attending physician.  Recommendations may be updated based on patient status, additional functional criteria and insurance authorization.  Follow Up Recommendations  Skilled nursing-short term rehab (<3 hours/day) Can patient physically be transported by private vehicle: No   Assistance Recommended at Discharge Frequent or constant Supervision/Assistance  Patient can return home with the following A lot of help with bathing/dressing/bathroom;Assistance with cooking/housework;Assist for transportation;Help with stairs or ramp for entrance;A little help with walking and/or transfers   Equipment Recommendations  None recommended by PT    Recommendations for Other Services       Precautions / Restrictions Precautions Precautions: Fall Restrictions Weight Bearing Restrictions: Yes RLE Weight Bearing: Weight bearing as tolerated     Mobility  Bed Mobility Overal bed mobility: Needs Assistance Bed Mobility: Supine to Sit      Supine to sit: Supervision, HOB elevated     General bed mobility comments: VCs for technique, used bedrail    Transfers Overall transfer level: Needs assistance Equipment used: Rolling walker (2 wheels) Transfers: Sit to/from Stand Sit to Stand: Min guard           General transfer comment: VCs hand placement, able to wipe self after urinating on 3 in 1    Ambulation/Gait Ambulation/Gait assistance: Min assist, Mod assist Gait Distance (Feet): 38 Feet Assistive device: Rolling walker (2 wheels) Gait Pattern/deviations: Decreased step length - right, Decreased step length - left, Shuffle Gait velocity: decreased     General Gait Details: steady, no loss of balance, distance limited by pain and fatigue SpO2 86% room air walking, 90% at rest on 2L  with pursed lip breathing.   Stairs             Wheelchair Mobility    Modified Rankin (Stroke Patients Only)       Balance Overall balance assessment: Modified Independent                                          Cognition Arousal/Alertness: Awake/alert Behavior During Therapy: WFL for tasks assessed/performed, Flat affect Overall Cognitive Status: Within Functional Limits for tasks assessed                                 General Comments: pt appears fatigued, pt reports generally feeling unwell        Exercises General Exercises - Lower Extremity Ankle Circles/Pumps: AROM, Both, 15 reps, Supine Heel Slides: AAROM, Right, 10  reps, Supine Hip ABduction/ADduction: AAROM, Right, 10 reps, Supine    General Comments        Pertinent Vitals/Pain Pain Assessment Faces Pain Scale: Hurts little more Pain Location: R hip with movement Pain Descriptors / Indicators: Grimacing, Guarding, Operative site guarding Pain Intervention(s): Limited activity within patient's tolerance, Monitored during session, Patient requesting pain meds-RN notified, Repositioned    Home Living                           Prior Function            PT Goals (current goals can now be found in the care plan section) Acute Rehab PT Goals Patient Stated Goal: Rehab and then home to son PT Goal Formulation: With patient Time For Goal Achievement: 11/11/22 Potential to Achieve Goals: Good Progress towards PT goals: Progressing toward goals    Frequency    Min 3X/week      PT Plan Current plan remains appropriate    Co-evaluation              AM-PAC PT "6 Clicks" Mobility   Outcome Measure  Help needed turning from your back to your side while in a flat bed without using bedrails?: A Little Help needed moving from lying on your back to sitting on the side of a flat bed without using bedrails?: A Little Help needed moving to and from a bed to a chair (including a wheelchair)?: A Little Help needed standing up from a chair using your arms (e.g., wheelchair or bedside chair)?: A Little Help needed to walk in hospital room?: A Little Help needed climbing 3-5 steps with a railing? : A Lot 6 Click Score: 17    End of Session Equipment Utilized During Treatment: Gait belt Activity Tolerance: Patient limited by fatigue;Patient limited by pain Patient left: in chair;with call bell/phone within reach;with family/visitor present Nurse Communication: Mobility status PT Visit Diagnosis: Difficulty in walking, not elsewhere classified (R26.2);Pain Pain - Right/Left: Right Pain - part of body: Hip     Time: 1442-1500 PT Time Calculation (min) (ACUTE ONLY): 18 min  Charges:  $Gait Training: 8-22 mins                     Blondell Reveal Kistler PT 11/01/2022  Acute Rehabilitation Services  Office [phone]

## 2022-11-01 NOTE — TOC Progression Note (Signed)
Transition of Care [facility]) - Progression Note    Patient Details  Name: Brenda Gibson MRN: [id number] Date of Birth: 28-Jun-1941  Transition of Care [facility]) CM/SW Contact  Lennart Pall, LCSW Phone Number: 11/01/2022, 1:08 PM  Clinical Narrative:     Spoke with pt and son this morning and confirming still in agreement with plan for SNF rehab.  SNF bed search begun.  Expected Discharge Plan: [place] Barriers to Discharge: Continued Medical Work up, SNF Pending bed offer  Expected Discharge Plan and Services     Post Acute Care Choice: [place] arrangements for the past 2 months:  (RV with son)                                       Social Determinants of Health (SDOH) Interventions SDOH Screenings   Food Insecurity: No Food Insecurity (10/26/2022)  Housing: Low Risk  (10/26/2022)  Transportation Needs: No Transportation Needs (10/26/2022)  Utilities: Not At Risk (10/26/2022)  Tobacco Use: High Risk (10/29/2022)    Readmission Risk Interventions     No data to display

## 2022-11-01 NOTE — Progress Notes (Signed)
5 Days Post-Op   Subjective/Chief Complaint: Sleeping, states no ab pain, no n/v since I saw her. Possible flatus   Objective: Vital signs in last 24 hours: Temp:  [98.3 F (36.8 C)-98.7 F (37.1 C)] 98.7 F (37.1 C) (01/01 0529) Pulse Rate:  [66-76] 66 (01/01 0529) Resp:  [17] 17 (01/01 0529) BP: (117-119)/(54-56) 117/56 (01/01 0529) SpO2:  [91 %-94 %] 91 % (01/01 0529) Last BM Date : 11/01/23  Intake/Output from previous day: 12/31 0701 - 01/01 0700 In: 840 [P.O.:740; I.V.:100] Out: -  Intake/Output this shift: No intake/output data recorded.  Mild distended nontender No LIH on exam, left groin nontender  Lab Results:  Recent Labs    10/30/22 0405 10/31/22 0327  WBC 13.0* 19.7*  HGB 11.6* 11.1*  HCT 37.7 35.6*  PLT 375 360   BMET Recent Labs    10/31/22 0327  NA 136  K 3.8  CL 95*  CO2 34*  GLUCOSE 145*  BUN 31*  CREATININE 0.58  CALCIUM 8.9   PT/INR No results for input(s): "LABPROT", "INR" in the last 72 hours. ABG No results for input(s): "PHART", "HCO3" in the last 72 hours.  Invalid input(s): "PCO2", "PO2"  Studies/Results: CT ABDOMEN PELVIS WO CONTRAST  Result Date: 10/31/2022 CLINICAL DATA:  Distension. Abdominal pain. Status post ORIF of right femoral neck fracture 4 days ago. EXAM: CT ABDOMEN AND PELVIS WITHOUT CONTRAST TECHNIQUE: Multidetector CT imaging of the abdomen and pelvis was performed following the standard protocol without IV contrast. RADIATION DOSE REDUCTION: This exam was performed according to the departmental dose-optimization program which includes automated exposure control, adjustment of the mA and/or kV according to patient size and/or use of iterative reconstruction technique. COMPARISON:  Abdomen 10/21/2022. CT of the abdomen and pelvis 05/24/2021. FINDINGS: Lower chest: New moderate bilateral pleural effusions are present. Previously seen 10 mm pleural based nodule in the left lower lobe is stable. No additional follow-up  is necessary. New bibasilar airspace opacities are associated with the effusions, likely atelectasis. Heart size is normal. No significant pericardial effusion is present. Marked wall thickening is present about the distal esophagus. Contrast is present in the distal esophagus. No discrete mass lesion is present. Hepatobiliary: Layering gallstones are present. No definite inflammatory scratched at no focal inflammatory changes are present about the gallbladder. Common bile duct is within normal limits. Liver is unremarkable. Pancreas: Unremarkable. No pancreatic ductal dilatation or surrounding inflammatory changes. Spleen: Normal in size without focal abnormality. Adrenals/Urinary Tract: Adrenal glands are normal bilaterally. Kidneys are unremarkable. No stone or mass lesion is present. Ureters are within normal limits bilaterally. Urinary bladder is dilated. No discrete lesion is present. Stomach/Bowel: The stomach is mildly distended. Duodenum is unremarkable. Proximal loops of small bowel are dilated up to 4 cm with fluid levels. Stool like material is present in the small bowel in the low anterior pelvis. A new 18 mm fluid collection extends into the left inguinal canal. This could represent a small loop of bowel as this is near the transition point. More likely a represents a small amount of fluid extending into the left inguinal canal. The more distal small bowel is collapsed. Gas and stool are present throughout the colon. No focal bowel inflammation is present. No pneumatosis is present. Vascular/Lymphatic: Extensive vascular calcifications are present. No aneurysm is present. No significant adenopathy is present. Reproductive: Uterus is deviated to the right. No focal lesions are present. No free air is present. Other: No other significant ventral hernia is present. No free  air is present. Musculoskeletal: No acute or significant osseous findings. IMPRESSION: 1. Multiple dilated loops of small bowel is  seen on prior studies. More normal caliber distal small bowel suggests at least a partial small bowel obstruction. 2. 18 mm fluid collection in the left inguinal canal may represent focal ascites. Although incarcerated hernia is not excluded, bowel does not seem to dilate up to this point in this seems less likely. 3. Stool like material within small bowel in the low anterior pelvis suggests slow transit and may be near the transition point. 4. New moderate bilateral pleural effusions with associated bibasilar airspace opacities, likely atelectasis. 5. Marked wall thickening about the distal esophagus compatible with esophagitis. No definite mass lesion is present. Recommend follow-up endoscopy. 6. Cholelithiasis without evidence for cholecystitis. 7. Stable 10 mm pleural based nodule in the left lower lobe. No additional follow-up is necessary. These results were called by telephone at the time of interpretation on 10/31/2022 at 3:24 pm to provider Avala PATEL , who verbally acknowledged these results. Electronically Signed   By: San Morelle M.D.   On: 10/31/2022 15:28   DG Abd Portable 1V  Result Date: 10/31/2022 CLINICAL DATA:  82 year old female postoperative day 4 right hip surgery. Constipation. EXAM: PORTABLE ABDOMEN - 1 VIEW COMPARISON:  Abdominal radiographs 05/28/2021 and earlier. FINDINGS: Portable AP supine view at 0814 hours. Proximal right femur ORIF with proximal dynamic hip screw. Lung bases appear negative. Widespread gas-filled small and large bowel loops throughout the abdomen and pelvis, although appearance not significantly changed from radiographs last year. Mild to moderate volume of retained stool also similar to prior studies. IMPRESSION: Widespread gas-filled bowel loops throughout the abdomen, but not significantly changed from last year. Top differential considerations include chronic decreased bowel motility versus postoperative ileus. Electronically Signed   By: Genevie Ann  M.D.   On: 10/31/2022 11:30    Anti-infectives: Anti-infectives (From admission, onward)    Start     Dose/Rate Route Frequency Ordered Stop   10/28/22 0600  ceFAZolin (ANCEF) IVPB 2g/100 mL premix        2 g 200 mL/hr over 30 Minutes Intravenous Every 8 hours 10/28/22 0034 10/28/22 2209   10/27/22 2108  ceFAZolin (ANCEF) 2-4 GM/100ML-% IVPB       Note to Pharmacy: Cleda Daub Y: cabinet override      10/27/22 2108 10/27/22 2142   10/27/22 2015  ceFAZolin (ANCEF) IVPB 2g/100 mL premix        2 g 200 mL/hr over 30 Minutes Intravenous  Once 10/27/22 1922 10/27/22 2128       Assessment/Plan: Likely ileus -would treat conservatively, I think can adat -bowel regimen -correct electrolytes -oob, pulm toilet -will sign off, please call back if needed  I reviewed hospitalist notes, last 24 h vitals and pain scores, last 48 h intake and output, last 24 h labs and trends, and last 24 h imaging results.  This care required straight-forward level of medical decision making.     Rolm Bookbinder 11/01/2022

## 2022-11-02 ENCOUNTER — Inpatient Hospital Stay (HOSPITAL_COMMUNITY): Payer: Medicare Other

## 2022-11-02 DIAGNOSIS — S72001A Fracture of unspecified part of neck of right femur, initial encounter for closed fracture: Secondary | ICD-10-CM | POA: Diagnosis not present

## 2022-11-02 LAB — CBC
HCT: 30.6 % — ABNORMAL LOW (ref 36.0–46.0)
Hemoglobin: 9.4 g/dL — ABNORMAL LOW (ref 12.0–15.0)
MCH: 26.6 pg (ref 26.0–34.0)
MCHC: 30.7 g/dL (ref 30.0–36.0)
MCV: 86.7 fL (ref 80.0–100.0)
Platelets: 330 10*3/uL (ref 150–400)
RBC: 3.53 MIL/uL — ABNORMAL LOW (ref 3.87–5.11)
RDW: 14.9 % (ref 11.5–15.5)
WBC: 10.4 10*3/uL (ref 4.0–10.5)
nRBC: 0 % (ref 0.0–0.2)

## 2022-11-02 LAB — MAGNESIUM: Magnesium: 2.1 mg/dL (ref 1.7–2.4)

## 2022-11-02 LAB — BASIC METABOLIC PANEL
Anion gap: 7 (ref 5–15)
BUN: 15 mg/dL (ref 8–23)
CO2: 29 mmol/L (ref 22–32)
Calcium: 8.1 mg/dL — ABNORMAL LOW (ref 8.9–10.3)
Chloride: 96 mmol/L — ABNORMAL LOW (ref 98–111)
Creatinine, Ser: 0.46 mg/dL (ref 0.44–1.00)
GFR, Estimated: >60 mL/min (ref >60)
Glucose, Bld: 105 mg/dL — ABNORMAL HIGH (ref 70–99)
Potassium: 3.8 mmol/L (ref 3.5–5.1)
Sodium: 132 mmol/L — ABNORMAL LOW (ref 135–145)

## 2022-11-02 MED ORDER — FUROSEMIDE 10 MG/ML IJ SOLN
20.0000 mg | Freq: Once | INTRAMUSCULAR | Status: AC
  Administered 2022-11-02: 20 mg via INTRAVENOUS
  Filled 2022-11-02: qty 2

## 2022-11-02 MED ORDER — DOCUSATE SODIUM 100 MG PO CAPS
200.0000 mg | ORAL_CAPSULE | Freq: Two times a day (BID) | ORAL | Status: DC
  Administered 2022-11-02 – 2022-11-03 (×3): 200 mg via ORAL
  Filled 2022-11-02 (×3): qty 2

## 2022-11-02 NOTE — Plan of Care (Signed)
  Problem: Activity: Goal: Risk for activity intolerance will decrease Outcome: Progressing   Problem: Nutrition: Goal: Adequate nutrition will be maintained Outcome: Progressing   Problem: Coping: Goal: Level of anxiety will decrease Outcome: Progressing   

## 2022-11-02 NOTE — Hospital Course (Signed)
PMH of HTN, COPD, osteoporosis, presenting to the hospital with a mechanical fall with right hip pain found to have nondisplaced subcapital right femoral neck fracture as well as pubic bone and rami fracture on the right.  Treated with hip sliding screw placement on 12/27.  Developed postop ileus treated conservatively.  Also has bilateral pleural effusion from vascular congestion receiving IV Lasix.

## 2022-11-02 NOTE — Progress Notes (Signed)
Occupational Therapy Treatment Patient Details Name: Brenda Gibson MRN: [id number] DOB: [birth year] Today's Date: 11/02/2022   History of present illness Patient is a 34 female who presented to the ED after mechanical fall with right hip and thigh pain. Found to have nondisplaced subcapital femoral neck fracture, in addition noted to have oblique fracture of like right pubic bone and displaced oblique fracture of right superior pubic ramus  -12/27 underwent internal fixation of hip with screw placement. Pleural effusions.PMH: hypertension, tobacco abuse, suspected COPD, osteoporosis   OT comments  Patient was noted to have increased o2 requirements of 4L/min to maintain O2 while participating in ADLs on this date. Nurse aware. Patient participated in there act to increase functional activity tolerance with noted drop to 84% on 4L/min with deep breathing and sitting to rest needed to return to 95% or higher. Patient's discharge plan remains appropriate at this time. OT will continue to follow acutely.     Recommendations for follow up therapy are one component of a multi-disciplinary discharge planning process, led by the attending physician.  Recommendations may be updated based on patient status, additional functional criteria and insurance authorization.    Follow Up Recommendations  Skilled nursing-short term rehab (<3 hours/day)     Assistance Recommended at Discharge Frequent or constant Supervision/Assistance  Patient can return home with the following  A lot of help with walking and/or transfers;A lot of help with bathing/dressing/bathroom;Assistance with cooking/housework;Help with stairs or ramp for entrance   Equipment Recommendations  Other (comment) (defer to next venue)    Recommendations for Other Services      Precautions / Restrictions Precautions Precautions: Fall Restrictions Weight Bearing Restrictions: No RLE Weight Bearing: Weight bearing as tolerated               ADL either performed or assessed with clinical judgement   ADL Overall ADL's : Needs assistance/impaired         General ADL Comments: Patient declined to participate in ADls at this time stating she did not need to use bathroom and had already completed bathing/dressing today. session focused on transfers to and from bed to recliner and ther act.      Cognition Arousal/Alertness: Awake/alert Behavior During Therapy: WFL for tasks assessed/performed, Flat affect Overall Cognitive Status: Within Functional Limits for tasks assessed                  Exercises Other Exercises Other Exercises: patient completed 5 reps x2 sets of sit to stands with patient noted to drop to 84% on 4L/min with completion of reps. patient was educated on deep breathing strategies and able to recover to 95% or higher with rest and deep breathing within 1 min.            Pertinent Vitals/ Pain       Pain Assessment Pain Assessment: Faces Faces Pain Scale: Hurts a little bit Pain Location: R hip with movement Pain Descriptors / Indicators: Grimacing, Guarding, Operative site guarding Pain Intervention(s): Limited activity within patient's tolerance, Premedicated before session         Frequency  Min 2X/week        Progress Toward Goals  OT Goals(current goals can now be found in the care plan section)  Progress towards OT goals: Progressing toward goals     Plan Discharge plan remains appropriate       AM-PAC OT "6 Clicks" Daily Activity     Outcome Measure   Help from another person eating meals?:  None Help from another person taking care of personal grooming?: A Little Help from another person toileting, which includes using toliet, bedpan, or urinal?: A Lot Help from another person bathing (including washing, rinsing, drying)?: A Lot Help from another person to put on and taking off regular upper body clothing?: A Little Help from another person to put on and taking  off regular lower body clothing?: Total 6 Click Score: 15    End of Session Equipment Utilized During Treatment: Gait belt;Rolling walker (2 wheels);Oxygen  OT Visit Diagnosis: Pain;History of falling (Z91.81)   Activity Tolerance Patient limited by fatigue   Patient Left in bed;with call bell/phone within reach;with bed alarm set   Nurse Communication Mobility status        Time: 5859-2924 OT Time Calculation (min): 12 min  Charges: OT General Charges $OT Visit: 1 Visit OT Treatments $Self Care/Home Management : 8-22 mins  Rennie Plowman, MS Acute Rehabilitation Department Office# [phone]   Willa Rough 11/02/2022, 3:33 PM

## 2022-11-02 NOTE — Progress Notes (Signed)
[facility] Progress Note Patient: Brenda Gibson HDQ:[id number] DOB: Nov 23, 1940 DOA: 10/25/2022  DOS: the patient was seen and examined on 11/02/2022  Brief hospital course: PMH of HTN, COPD, osteoporosis, presenting to the hospital with a mechanical fall with right hip pain found to have nondisplaced subcapital right femoral neck fracture as well as pubic bone and rami fracture on the right.  Treated with hip sliding screw placement on 12/27.  Developed postop ileus treated conservatively.  Also has bilateral pleural effusion from vascular congestion receiving IV Lasix. Assessment and Plan: Closed right hip fracture. Following mechanical fall. Underwent hip sliding screw placement on 12/27. Lovenox for DVT prophylaxis. WBAT. Continue pain control.   Partial small bowel obstructions versus ileus with severe constipation. Concern for right inguinal fluid collection. Patient had an x-ray which showed concern for possible bowel obstruction. A CT was performed although unable to perform with contrast due to IV being infiltrated. Discussed with general surgery recommend conservative management. Tolerating soft diet.  Monitor.  X-ray shows improvement in ileus.   Pelvic fracture and pubic rami fracture. Continue pain control. Monitor.   COPD. Mild hypoxia. Bilateral pleural effusion. Continue incentive spirometry. Repeat chest x-ray shows small bilateral pleural effusion.  Treated with IV Lasix.  Hold fluids.   HTN. Blood pressures medication currently on hold.   History of osteoporosis. Follows up with PCP.   Grade 1 diastolic dysfunction. Does not appear to be volume overloaded right now but does have this bilateral pleural effusion will monitor.   Leukocytosis. Likely from stress reaction to partial bowel obstruction. Appreciate surgery consultation.  Improving.  Monitor.   Drowsiness. Resolved.   Severe protein calorie malnutrition. Body mass index is 19.96  kg/m. Nutrition Problem: Severe Malnutrition Etiology: chronic illness Nutrition Interventions: Interventions: Magic cup, Refer to RD note for recommendations    Subjective: Mentation improving.  No nausea no vomiting.  Passing gas but no BM.  No fever no chills.  Physical Exam: General: in Mild distress, No Rash Cardiovascular: S1 and S2 Present, No Murmur Respiratory: Good respiratory effort, Bilateral Air entry present. Bilateral Crackles, No wheezes Abdomen: Bowel Sound present, No tenderness Extremities: No edema Neuro: Alert and oriented x3, no new focal deficit  Data Reviewed: I have Reviewed nursing notes, Vitals, and Lab results. Since last encounter, pertinent lab results CBC and BMP   . I have ordered test including CBC and BMP  . I have ordered imaging x-ray chest and x-ray abdomen  .   Disposition: Status is: Inpatient Remains inpatient appropriate because: Need IV diuresis and improvement in oral intake  enoxaparin (LOVENOX) injection 40 mg Start: 10/28/22 1300 SCDs Start: 10/26/22 1029   Family Communication: Son at bedside Level of care: Med-Surg  Vitals:   11/01/22 0529 11/01/22 1304 11/02/22 0208 11/02/22 0524  BP: (!) 117/56 114/63 (!) 144/72 133/70  Pulse: 66 62 (!) 59 61  Resp: '17 18 16 20  '$ Temp: 98.7 F (37.1 C) 98.3 F (36.8 C) 98.2 F (36.8 C) 98.6 F (37 C)  TempSrc: Oral Oral Oral Oral  SpO2: 91% 96% 94% 99%  Weight:      Height:         Author: Berle Mull, MD 11/02/2022 7:39 PM  Please look on [email or web address] to find out who is on call.

## 2022-11-02 NOTE — Progress Notes (Signed)
Physical Therapy Treatment Patient Details Name: Brenda Gibson MRN: [id number] DOB: 05-24-1941 Today's Date: 11/02/2022   History of Present Illness 81/F with history of hypertension, tobacco abuse, suspected COPD, osteoporosis presented to the ED after mechanical fall with right hip and thigh pain. Found to have nondisplaced subcapital femoral neck fracture, in addition noted to have oblique fracture of like right pubic bone and displaced oblique fracture of right superior pubic ramus  -12/27 underwent internal fixation of hip with screw placement. Pleural effusions.    PT Comments    POD #6  General Comments: Pt AXO x 3 not feeling well today.  Looks "weary".  Currently on 4 lts nasal at 99%. Pt OOB in recliner.  Breakfast tray barely touched.  No c/o nausea or ABD pain.  Assisted with amb was limited.  General Gait Details: decreased amb distance due to increased c/o fatigue/weakness and dyspnea.  RA decreased from 92% at rest to 80%.  Dyspnea 3/4.  This is NEW.  Per chart review a chest X ray was ordered.  Audible congestion with poor ability to cough/clear due to MAX c/o fatigue. Pt will need ST Rehab at SNF to address mobility and functional decline prior to safely returning home.    Recommendations for follow up therapy are one component of a multi-disciplinary discharge planning process, led by the attending physician.  Recommendations may be updated based on patient status, additional functional criteria and insurance authorization.  Follow Up Recommendations  Skilled nursing-short term rehab (<3 hours/day) Can patient physically be transported by private vehicle: Yes   Assistance Recommended at Discharge Frequent or constant Supervision/Assistance  Patient can return home with the following A lot of help with bathing/dressing/bathroom;Assistance with cooking/housework;Assist for transportation;Help with stairs or ramp for entrance;A little help with walking and/or transfers    Equipment Recommendations  None recommended by PT    Recommendations for Other Services       Precautions / Restrictions Precautions Precautions: Fall Restrictions Weight Bearing Restrictions: No RLE Weight Bearing: Weight bearing as tolerated     Mobility  Bed Mobility Overal bed mobility: Needs Assistance             General bed mobility comments: OOB in recliner    Transfers Overall transfer level: Needs assistance Equipment used: Rolling walker (2 wheels) Transfers: Sit to/from Stand Sit to Stand: Min guard, Min assist           General transfer comment: VCs hand placement, able to wipe self after urinating on 3 in 1    Ambulation/Gait Ambulation/Gait assistance: Min guard, Min assist Gait Distance (Feet): 12 Feet Assistive device: Rolling walker (2 wheels) Gait Pattern/deviations: Decreased step length - right, Decreased step length - left, Shuffle Gait velocity: decreased     General Gait Details: decreased amb distance due to increased c/o fatigue/weakness and dyspnea.  RA decreased from 92% at rest to 80%.  Dyspnea 3/4.  This is NEW.  Per chart review a chest X ray was ordered.   Stairs             Wheelchair Mobility    Modified Rankin (Stroke Patients Only)       Balance                                            Cognition Arousal/Alertness: Awake/alert Behavior During Therapy: WFL for tasks assessed/performed, Flat affect  Overall Cognitive Status: Within Functional Limits for tasks assessed                                 General Comments: Pt AXO x 3 not feeling well today.  Looks "weary".  Currently on 4 lts nasal at 99%.        Exercises      General Comments        Pertinent Vitals/Pain Pain Assessment Pain Assessment: Faces Faces Pain Scale: Hurts a little bit Pain Location: R hip with movement Pain Descriptors / Indicators: Grimacing, Guarding, Operative site guarding Pain  Intervention(s): Repositioned    Home Living                          Prior Function            PT Goals (current goals can now be found in the care plan section) Progress towards PT goals: Progressing toward goals    Frequency    Min 3X/week      PT Plan Current plan remains appropriate    Co-evaluation              AM-PAC PT "6 Clicks" Mobility   Outcome Measure  Help needed turning from your back to your side while in a flat bed without using bedrails?: A Little Help needed moving from lying on your back to sitting on the side of a flat bed without using bedrails?: A Little Help needed moving to and from a bed to a chair (including a wheelchair)?: A Little Help needed standing up from a chair using your arms (e.g., wheelchair or bedside chair)?: A Lot Help needed to walk in hospital room?: A Lot Help needed climbing 3-5 steps with a railing? : A Lot 6 Click Score: 15    End of Session Equipment Utilized During Treatment: Gait belt Activity Tolerance: Patient limited by fatigue Patient left: in chair;with call bell/phone within reach Nurse Communication: Mobility status PT Visit Diagnosis: Difficulty in walking, not elsewhere classified (R26.2);Pain Pain - Right/Left: Right Pain - part of body: Hip     Time: 0902-0927 PT Time Calculation (min) (ACUTE ONLY): 25 min  Charges:  $Gait Training: 8-22 mins $Therapeutic Activity: 8-22 mins                     Rica Koyanagi  PTA Acute  Rehabilitation Services Office M-F          [phone] Weekend pager [phone]

## 2022-11-03 DIAGNOSIS — S72001A Fracture of unspecified part of neck of right femur, initial encounter for closed fracture: Secondary | ICD-10-CM | POA: Diagnosis not present

## 2022-11-03 LAB — BASIC METABOLIC PANEL
Anion gap: 10 (ref 5–15)
BUN: 13 mg/dL (ref 8–23)
CO2: 32 mmol/L (ref 22–32)
Calcium: 8.2 mg/dL — ABNORMAL LOW (ref 8.9–10.3)
Chloride: 92 mmol/L — ABNORMAL LOW (ref 98–111)
Creatinine, Ser: 0.45 mg/dL (ref 0.44–1.00)
GFR, Estimated: >60 mL/min (ref >60)
Glucose, Bld: 110 mg/dL — ABNORMAL HIGH (ref 70–99)
Potassium: 3.2 mmol/L — ABNORMAL LOW (ref 3.5–5.1)
Sodium: 134 mmol/L — ABNORMAL LOW (ref 135–145)

## 2022-11-03 LAB — CBC
HCT: 33.2 % — ABNORMAL LOW (ref 36.0–46.0)
Hemoglobin: 9.9 g/dL — ABNORMAL LOW (ref 12.0–15.0)
MCH: 26.1 pg (ref 26.0–34.0)
MCHC: 29.8 g/dL — ABNORMAL LOW (ref 30.0–36.0)
MCV: 87.4 fL (ref 80.0–100.0)
Platelets: 391 10*3/uL (ref 150–400)
RBC: 3.8 MIL/uL — ABNORMAL LOW (ref 3.87–5.11)
RDW: 14.6 % (ref 11.5–15.5)
WBC: 10.4 10*3/uL (ref 4.0–10.5)
nRBC: 0 % (ref 0.0–0.2)

## 2022-11-03 LAB — MAGNESIUM: Magnesium: 2.1 mg/dL (ref 1.7–2.4)

## 2022-11-03 MED ORDER — POTASSIUM CHLORIDE CRYS ER 20 MEQ PO TBCR
40.0000 meq | EXTENDED_RELEASE_TABLET | Freq: Every day | ORAL | Status: DC
  Administered 2022-11-03: 40 meq via ORAL
  Filled 2022-11-03: qty 2

## 2022-11-03 MED ORDER — PROSOURCE PLUS PO LIQD
30.0000 mL | Freq: Two times a day (BID) | ORAL | Status: DC
  Administered 2022-11-04 – 2022-11-08 (×8): 30 mL via ORAL
  Filled 2022-11-03 (×8): qty 30

## 2022-11-03 MED ORDER — SENNOSIDES-DOCUSATE SODIUM 8.6-50 MG PO TABS
1.0000 | ORAL_TABLET | Freq: Two times a day (BID) | ORAL | Status: DC
  Administered 2022-11-03 – 2022-11-08 (×10): 1 via ORAL
  Filled 2022-11-03 (×11): qty 1

## 2022-11-03 MED ORDER — HYDROCHLOROTHIAZIDE 12.5 MG PO TABS
12.5000 mg | ORAL_TABLET | Freq: Every day | ORAL | Status: DC
  Administered 2022-11-03 – 2022-11-08 (×6): 12.5 mg via ORAL
  Filled 2022-11-03 (×6): qty 1

## 2022-11-03 MED ORDER — ZOLPIDEM TARTRATE 5 MG PO TABS
5.0000 mg | ORAL_TABLET | Freq: Every day | ORAL | Status: DC
  Administered 2022-11-03 – 2022-11-04 (×2): 5 mg via ORAL
  Filled 2022-11-03 (×2): qty 1

## 2022-11-03 MED ORDER — FUROSEMIDE 40 MG PO TABS
40.0000 mg | ORAL_TABLET | Freq: Two times a day (BID) | ORAL | Status: DC
  Administered 2022-11-03 – 2022-11-05 (×5): 40 mg via ORAL
  Filled 2022-11-03 (×5): qty 1

## 2022-11-03 MED ORDER — OXYCODONE HCL 5 MG PO TABS
5.0000 mg | ORAL_TABLET | ORAL | Status: DC | PRN
  Administered 2022-11-06: 5 mg via ORAL
  Filled 2022-11-03: qty 1

## 2022-11-03 NOTE — Progress Notes (Signed)
Nutrition Follow-up  DOCUMENTATION CODES:   Severe malnutrition in context of chronic illness  INTERVENTION:  - Continue Soft diet as medically appropriate. Advance to Regular as able. - Continue Magic cup TID with meals once diet advanced. Each supplement provides 290 kcal and 9 grams of protein. - Encourage intake at all meals and of supplements. - Monitor weight trends.  NUTRITION DIAGNOSIS:   Severe Malnutrition related to chronic illness as evidenced by severe fat depletion, severe muscle depletion. *ongoing  GOAL:   Patient will meet greater than or equal to 90% of their needs *not being met  MONITOR:   PO intake, Supplement acceptance, Diet advancement, Weight trends  REASON FOR ASSESSMENT:   Consult Hip fracture protocol  ASSESSMENT:   82 y.o. female with PMH significant of basal cell carcinoma of the nose, seborrheic dermatitis of the scalp, COVID-19 infection, measles, varicella zoster, HTN, hyperglycemia, myalgias, osteoporosis, tobacco abuse who was brought to the ED due to right hip and right thigh pain after having a fall at home 5 days ago. Found to have R hip fracture and R pubic ramus fracture.  12/25 Admit 12/27 underwent sliding hip screw placement  12/30 pt developed abdominal distention with mild nausea, concern for ileus vs SBO vs severe constipation 1/1 Soft diet 1/2 xray with improved ileus    Patient sleeping at time of visit but awoke to sound of voice. Reports she hasn't has much to eat over the past few days due to SBO. Appetite has also been poor. Patient mostly NPO or on clear liquids the past 4 days which has also likely impacted intake. Stressed importance of trying to eat something at all 3 meals as able to  support meeting nutritional needs. Patient states she likes [employer] and has been consuming, will continue supplement.     Medications reviewed and include: Colace, Miralax, Potassium daily,   Labs reviewed:  Na 134 K+  3.2   Diet Order:   Diet Order             DIET SOFT Room service appropriate? Yes; Fluid consistency: Thin  Diet effective now                   EDUCATION NEEDS:  Education needs have been addressed  Skin:  Skin Assessment: Reviewed RN Assessment  Last BM:  12/31  Height:  Ht Readings from Last 1 Encounters:  10/27/22 _0  (1.651 m)   Weight:  Wt Readings from Last 1 Encounters:  10/27/22 54.4 kg    BMI:  Body mass index is 19.96 kg/m.  Estimated Nutritional Needs:  Kcal:  1550-1650 kcals Protein:  75-85 grams Fluid:  >/= 1.5L    Brenda Gibson RD, LDN For contact information, refer to [facility].

## 2022-11-03 NOTE — Plan of Care (Signed)
  Problem: Clinical Measurements: Goal: Respiratory complications will improve Outcome: Progressing   Problem: Nutrition: Goal: Adequate nutrition will be maintained Outcome: Progressing   Problem: Activity: Goal: Ability to ambulate and perform ADLs will improve Outcome: Progressing

## 2022-11-03 NOTE — Progress Notes (Signed)
Orthopedic Surgery Progress Note   Assessment: Patient is a 82 y.o. female with right femoral neck fracture status post hip screw   Plan: -Operative plans: complete -Diet: regular -DVT ppx: per primary, prescribed aspirin '81mg'$  BID for discharge  -Antibiotics: ancef x2 post-op doses -Weight bearing status: as tolerated -PT/OT evaluate and treat -Pain control -Dispo: per primary  ___________________________________________________________________________  Subjective: No acute events overnight. Was able to get some sleep last night. Not having any hip pain. Denies paresthesias and numbness.    Physical Exam:  General: no acute distress, appears stated age Neurologic: alert, answering questions appropriately, following commands Respiratory: unlabored breathing on supplemental O2, symmetric chest rise  MSK:   -Right lower extremity  Dressing over hip c/d/i EHL/TA/GSC intact Plantarflexes and dorsiflexes toes Sensation intact to light touch in sural, saphenous, tibial, deep peroneal, and superficial peroneal nerve distributions Foot warm and well perfused   Patient name: Brenda Gibson Patient MRN: [id number] Date: 11/03/22

## 2022-11-03 NOTE — Progress Notes (Signed)
PROGRESS NOTE   Brenda Gibson  GXQ:[id number] DOB: Jan 09, 1941 DOA: 10/25/2022 PCP: Pcp, No  Brief Narrative:  82 year old white female Underlying COPD continued tobacco Basal cell CA nose with seborrheic dermatitis HTN, schizoaffective disorder with prior hospitalization in the remote past (2002) Lung nodule Known grade 1 diastolic dysfunction Prior SBO status post LOA 06/25/2016 Sustained fall October 21, 2022, brought to ED 12/26 Found to have nondisplaced subcapital right-sided femoral fracture with mild impaction as well as nondisplaced longitudinal oblique fracture right pubic bone  12/27 underwent IM nail right side 12/30-developed constipation-imaging showed ileus-also developed hypoxia showing on x-ray?  Pleural effusions which resolved 12/31:-General surgery consulted same day    Hospital-Problem based course  Right-sided hip fracture status post IM nail as above - Discontinue morphine if at all able use Oxy IR and Tylenol combination -Currently is on Lovenox, on discharge use aspirin 81 twice daily until 2/8 -Weightbearing precautions as per orthopedics  Partial SBO versus ileus and severe constipation - Conservative management as per general surgery who saw patient previously - No stool in several days still but passing gas - Stop Colace give senna twice daily continue MiraLAX 17 daily as well as other meds - If no better give either milk of magnesium or sorbitol  COPD Bilateral pleural effusions - Given IV fluid initially, currently 8 L positive - Given Lasix on 1/2, will repeat with Lasix 40 twice daily and recheck labs a.m. -Desat screen needed prior to discharge if able to take off oxygen will discharge off oxygen  Grade 1 diastolic dysfunction with pleural effusions as above - As above  Hypokalemia likely related to diuresis - Check magnesium in a.m. replace orally with K-Dur 40  Severe protein energy malnutrition BMI 19 - Dietary counseling  History  hypertension -Start HCTZ 12.5 holding amlodipine 5  DVT prophylaxis: Lovenox Code Status: Full Family Communication: Discussed with son at bedside Disposition:  Status is: Inpatient Remains inpatient appropriate because:   Needs to pass flatus stool and be more dry prior to discharge   Consultants:  Orthopedics  Procedures:   Antimicrobials:     Subjective: Awake coherent had some anxiety overnight related to breathing  having a mild difficulty swallowing but is able to keep down some pills Still on oxygen no chest pain  Objective: Vitals:   11/02/22 0208 11/02/22 0524 11/02/22 2102 11/03/22 0336  BP: (!) 144/72 133/70 (!) 142/65 (!) 158/73  Pulse: (!) 59 61 60 60  Resp: '16 20 18 16  '$ Temp: 98.2 F (36.8 C) 98.6 F (37 C) 97.7 F (36.5 C) 98 F (36.7 C)  TempSrc: Oral Oral Oral Oral  SpO2: 94% 99% 97% 91%  Weight:      Height:        Intake/Output Summary (Last 24 hours) at 11/03/2022 0751 Last data filed at 11/03/2022 0552 Gross per 24 hour  Intake 420 ml  Output 1100 ml  Net -680 ml   Filed Weights   10/25/22 2144 10/27/22 1719  Weight: 54.4 kg 54.4 kg    Examination:  Awake coherent no distress EOMI NCAT no focal deficit no icterus no pallor frail cachectic Abdomen slightly distended with some tympany S1-S2 no murmur no rub no gallop No lower extremity edema able to straight leg raise bilaterally neuro grossly intact  Data Reviewed: personally reviewed   CBC    Component Value Date/Time   WBC 10.4 11/03/2022 0340   RBC 3.80 (L) 11/03/2022 0340   HGB 9.9 (L) 11/03/2022 0340  HCT 33.2 (L) 11/03/2022 0340   PLT 391 11/03/2022 0340   MCV 87.4 11/03/2022 0340   MCH 26.1 11/03/2022 0340   MCHC 29.8 (L) 11/03/2022 0340   RDW 14.6 11/03/2022 0340   LYMPHSABS 1.5 10/26/2022 0857   MONOABS 1.0 10/26/2022 0857   EOSABS 0.0 10/26/2022 0857   BASOSABS 0.1 10/26/2022 0857      Latest Ref Rng & Units 11/03/2022    3:40 AM 11/02/2022    3:37 AM 11/01/2022    10:57 AM  CMP  Glucose 70 - 99 mg/dL 110  105  94   BUN 8 - 23 mg/dL '13  15  22   '$ Creatinine 0.44 - 1.00 mg/dL 0.45  0.46  0.45   Sodium 135 - 145 mmol/L 134  132  131   Potassium 3.5 - 5.1 mmol/L 3.2  3.8  4.0   Chloride 98 - 111 mmol/L 92  96  95   CO2 22 - 32 mmol/L 32  29  29   Calcium 8.9 - 10.3 mg/dL 8.2  8.1  8.1      Radiology Studies: DG Abd Portable 1V  Result Date: 11/02/2022 CLINICAL DATA:  Partial small bowel obstruction. EXAM: PORTABLE ABDOMEN - 1 VIEW COMPARISON:  Radiograph and CT 10/31/2022 FINDINGS: Gaseous distention of small bowel in the central abdomen up to 4.3 cm. There is slight improvement in small bowel distension from prior imaging. Air and stool mixed throughout the colon. No obvious free air. Vascular calcifications. IMPRESSION: Slight improvement in small bowel distension from prior imaging. Persistent gaseous distention of small bowel in the central abdomen. Electronically Signed   By: Keith Rake M.D.   On: 11/02/2022 15:32   DG CHEST PORT 1 VIEW  Result Date: 11/02/2022 CLINICAL DATA:  Bilateral pleural effusion. EXAM: PORTABLE CHEST 1 VIEW COMPARISON:  Chest radiograph 10/26/2022. Lung bases from abdominal CT 10/31/2022 FINDINGS: Hazy bibasilar opacities present bilateral pleural effusions, more so on the right. Findings are grossly stable from recent abdominal CT allowing for differences in modality stable heart size and mediastinal contours. Compressive atelectasis suspected related to pleural effusions, no other focal airspace disease. No pneumothorax. No pulmonary edema. On limited assessment, no acute osseous findings. IMPRESSION: Bilateral pleural effusions, more so on the right. Findings are grossly stable from recent abdominal CT. Electronically Signed   By: Keith Rake M.D.   On: 11/02/2022 15:30     Scheduled Meds:  docusate sodium  200 mg Oral BID   enoxaparin (LOVENOX) injection  40 mg Subcutaneous Q24H   pantoprazole  40 mg Oral  Daily   polyethylene glycol  17 g Oral Daily   simethicone  80 mg Oral QID   Continuous Infusions:   LOS: 8 days   Time spent: [place], MD [facility] To contact the attending provider between 7A-7P or the covering provider during after hours 7P-7A, please log into the web site [email or web address] and access using universal [place] password for that web site. If you do not have the password, please call the hospital operator.  11/03/2022, 7:51 AM

## 2022-11-04 DIAGNOSIS — S72001A Fracture of unspecified part of neck of right femur, initial encounter for closed fracture: Secondary | ICD-10-CM | POA: Diagnosis not present

## 2022-11-04 LAB — BASIC METABOLIC PANEL
Anion gap: 8 (ref 5–15)
BUN: 11 mg/dL (ref 8–23)
CO2: 35 mmol/L — ABNORMAL HIGH (ref 22–32)
Calcium: 8.5 mg/dL — ABNORMAL LOW (ref 8.9–10.3)
Chloride: 92 mmol/L — ABNORMAL LOW (ref 98–111)
Creatinine, Ser: 0.51 mg/dL (ref 0.44–1.00)
GFR, Estimated: >60 mL/min (ref >60)
Glucose, Bld: 128 mg/dL — ABNORMAL HIGH (ref 70–99)
Potassium: 3.3 mmol/L — ABNORMAL LOW (ref 3.5–5.1)
Sodium: 135 mmol/L (ref 135–145)

## 2022-11-04 LAB — MAGNESIUM: Magnesium: 2.1 mg/dL (ref 1.7–2.4)

## 2022-11-04 MED ORDER — SORBITOL 70 % SOLN
30.0000 mL | Freq: Every day | Status: DC
  Administered 2022-11-04 – 2022-11-08 (×5): 30 mL via ORAL
  Filled 2022-11-04 (×5): qty 30

## 2022-11-04 MED ORDER — POTASSIUM CHLORIDE CRYS ER 20 MEQ PO TBCR
40.0000 meq | EXTENDED_RELEASE_TABLET | Freq: Two times a day (BID) | ORAL | Status: DC
  Administered 2022-11-04 – 2022-11-05 (×4): 40 meq via ORAL
  Filled 2022-11-04 (×4): qty 2

## 2022-11-04 MED ORDER — BISACODYL 5 MG PO TBEC: 5.0000 mg | DELAYED_RELEASE_TABLET | Freq: Every day | ORAL | Status: DC | PRN

## 2022-11-04 NOTE — Progress Notes (Signed)
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  Pleural effusions which resolved 12/31:-General surgery consulted same day    Hospital-Problem based course  Right-sided hip fracture status post IM nail as above - Discontinue morphine -- use Oxy IR and Tylenol combination -Currently is on Lovenox, on discharge use aspirin 81 twice daily until 2/8 -Weightbearing precautions as per orthopedics  Partial SBO versus ileus and severe constipation - Conservative management as per general surgery who saw patient previously - miralax changed to sorbitol--add dulcolax  COPD Bilateral pleural effusions - Given IV fluid initially, currently 4 L positive - continue Lasix 40 twice daily  po and recheck labs a.m. -[place] screen needed prior to discharge if able to take off oxygen will discharge off oxygen  Grade 1 diastolic dysfunction with pleural effusions as above - As above  Hypokalemia likely related to diuresis - Check magnesium in a.m. replace orally with K-Dur 40 bid  Severe protein energy malnutrition BMI 19 - Dietary counseling  History hypertension -Start HCTZ 12.5 holding amlodipine 5  DVT prophylaxis: Lovenox Code Status: Full Family Communication: Discussed with son at bedside Disposition:  Status is:  Inpatient Remains inpatient appropriate because:   Needs to pass flatus stool and be more dry prior to discharge   Consultants:  Orthopedics  Procedures:   Antimicrobials:     Subjective:  Fair Some flatus, no stool No fever no cough   Objective: Vitals:   11/03/22 0336 11/03/22 1327 11/03/22 1939 11/04/22 0439  BP: (!) 158/73 (!) 196/63 (!) 157/71 113/73  Pulse: 60 66 68 62  Resp: '16 18 18 19  '$ Temp: 98 F (36.7 C) 98.8 F (37.1 C) 97.9 F (36.6 C) 97.7 F (36.5 C)  TempSrc: Oral Oral Oral Oral  SpO2: 91% 100% 93% 93%  Weight:      Height:        Intake/Output Summary (Last 24 hours) at 11/04/2022 1028 Last data filed at 11/04/2022 0814 Gross per 24 hour  Intake 480 ml  Output 1400 ml  Net -920 ml    Filed Weights   10/25/22 2144 10/27/22 1719  Weight: 54.4 kg 54.4 kg    Examination:  EOMI NCAT no focal deficit no icterus no pallor frail cachectic on oxygen Abdomen distended with some tympany S1-S2 no murmur no rub no gallop No lower extremity edema able to straight leg raise bilaterally neuro grossly intact  Data Reviewed: personally reviewed   CBC    Component Value Date/Time   WBC 10.4 11/03/2022 0340   RBC 3.80 (L) 11/03/2022 0340   HGB 9.9 (L) 11/03/2022 0340   HCT 33.2 (L) 11/03/2022 0340   PLT 391 11/03/2022 0340   MCV 87.4 11/03/2022 0340   MCH 26.1 11/03/2022 0340   MCHC 29.8 (L) 11/03/2022 0340   RDW 14.6 11/03/2022 0340   LYMPHSABS 1.5 10/26/2022 0857   MONOABS 1.0 10/26/2022 0857   EOSABS 0.0 10/26/2022 0857   BASOSABS  0.1 10/26/2022 0857      Latest Ref Rng & Units 11/04/2022    3:20 AM 11/03/2022    3:40 AM 11/02/2022    3:37 AM  CMP  Glucose 70 - 99 mg/dL 128  110  105   BUN 8 - 23 mg/dL '11  13  15   '$ Creatinine 0.44 - 1.00 mg/dL 0.51  0.45  0.46   Sodium 135 - 145 mmol/L 135  134  132   Potassium 3.5 - 5.1 mmol/L 3.3  3.2  3.8   Chloride 98 - 111 mmol/L 92  92  96   CO2 22 - 32 mmol/L 35  32  29   Calcium 8.9 - 10.3 mg/dL  8.5  8.2  8.1      Radiology Studies: DG Abd Portable 1V  Result Date: 11/02/2022 CLINICAL DATA:  Partial small bowel obstruction. EXAM: PORTABLE ABDOMEN - 1 VIEW COMPARISON:  Radiograph and CT 10/31/2022 FINDINGS: Gaseous distention of small bowel in the central abdomen up to 4.3 cm. There is slight improvement in small bowel distension from prior imaging. Air and stool mixed throughout the colon. No obvious free air. Vascular calcifications. IMPRESSION: Slight improvement in small bowel distension from prior imaging. Persistent gaseous distention of small bowel in the central abdomen. Electronically Signed   By: Keith Rake M.D.   On: 11/02/2022 15:32   DG CHEST PORT 1 VIEW  Result Date: 11/02/2022 CLINICAL DATA:  Bilateral pleural effusion. EXAM: PORTABLE CHEST 1 VIEW COMPARISON:  Chest radiograph 10/26/2022. Lung bases from abdominal CT 10/31/2022 FINDINGS: Hazy bibasilar opacities present bilateral pleural effusions, more so on the right. Findings are grossly stable from recent abdominal CT allowing for differences in modality stable heart size and mediastinal contours. Compressive atelectasis suspected related to pleural effusions, no other focal airspace disease. No pneumothorax. No pulmonary edema. On limited assessment, no acute osseous findings. IMPRESSION: Bilateral pleural effusions, more so on the right. Findings are grossly stable from recent abdominal CT. Electronically Signed   By: Keith Rake M.D.   On: 11/02/2022 15:30     Scheduled Meds:  (feeding supplement) PROSource Plus  30 mL Oral BID BM   enoxaparin (LOVENOX) injection  40 mg Subcutaneous Q24H   furosemide  40 mg Oral BID   hydrochlorothiazide  12.5 mg Oral Daily   pantoprazole  40 mg Oral Daily   potassium chloride  40 mEq Oral BID   senna-docusate  1 tablet Oral BID   sorbitol  30 mL Oral Daily   zolpidem  5 mg Oral QHS   Continuous Infusions:   LOS: 9 days   Time spent: [place],  MD [facility] To contact the attending provider between 7A-7P or the covering provider during after hours 7P-7A, please log into the web site [email or web address] and access using universal [place] password for that web site. If you do not have the password, please call the hospital operator.  11/04/2022, 10:28 AM

## 2022-11-04 NOTE — Plan of Care (Signed)
  Problem: Coping: Goal: Level of anxiety will decrease Outcome: Progressing   Problem: Pain Managment: Goal: General experience of comfort will improve Outcome: Progressing   Problem: Safety: Goal: Ability to remain free from injury will improve Outcome: Progressing   

## 2022-11-04 NOTE — TOC Progression Note (Signed)
Transition of Care [facility]) - Progression Note    Patient Details  Name: ARY RUDNICK MRN: [id number] Date of Birth: Feb 10, 1941  [facility] CM/SW Contact  Lennart Pall, LCSW Phone Number: 11/04/2022, 3:04 PM  Clinical Narrative:      Continue to await medical clearance for SNF discharge.   Expected Discharge Plan: [place] Barriers to Discharge: Continued Medical Work up, SNF Pending bed offer  Expected Discharge Plan and Services     Post Acute Care Choice: [place] arrangements for the past 2 months:  (RV with son)                                       Social Determinants of Health (SDOH) Interventions SDOH Screenings   Food Insecurity: No Food Insecurity (10/26/2022)  Housing: Low Risk  (10/26/2022)  Transportation Needs: No Transportation Needs (10/26/2022)  Utilities: Not At Risk (10/26/2022)  Tobacco Use: High Risk (10/29/2022)    Readmission Risk Interventions     No data to display

## 2022-11-04 NOTE — Care Management Important Message (Signed)
Important Message  Patient Details IM Letter given. Name: Brenda Gibson MRN: [id number] Date of Birth: 12-22-1940   Medicare Important Message Given:  Yes     Kerin Salen 11/04/2022, 1:58 PM

## 2022-11-04 NOTE — Plan of Care (Signed)
Problem: Education: Goal: Knowledge of General Education information will improve Description: Including pain rating scale, medication(s)/side effects and non-pharmacologic comfort measures Outcome: Progressing   Problem: Health Behavior/Discharge Planning: Goal: Ability to manage health-related needs will improve Outcome: Progressing   Problem: Clinical Measurements: Goal: Ability to maintain clinical measurements within normal limits will improve Outcome: [place], RN 11/04/22 8:18 PM

## 2022-11-04 NOTE — Plan of Care (Signed)
  Problem: Coping: Goal: Level of anxiety will decrease Outcome: Progressing   Problem: Pain Managment: Goal: General experience of comfort will improve Outcome: Progressing   

## 2022-11-04 NOTE — Progress Notes (Signed)
Physical Therapy Treatment Patient Details Name: Brenda Gibson MRN: [id number] DOB: 11-18-1940 Today's Date: 11/04/2022   History of Present Illness 81/F with history of hypertension, tobacco abuse, suspected COPD, osteoporosis presented to the ED after mechanical fall with right hip and thigh pain. Found to have nondisplaced subcapital femoral neck fracture, in addition noted to have oblique fracture of like right pubic bone and displaced oblique fracture of right superior pubic ramus  -12/27 underwent internal fixation of hip with screw placement. Pleural effusions.    PT Comments    Pt tolerated increased ambulation distance of 60' with RW, no loss of balance, she ambulated with 4L O2, unable to obtain SpO2 reading as pulse oximeter did not give a reading. Pt performed RLE exercises with min assist.     Recommendations for follow up therapy are one component of a multi-disciplinary discharge planning process, led by the attending physician.  Recommendations may be updated based on patient status, additional functional criteria and insurance authorization.  Follow Up Recommendations  Skilled nursing-short term rehab (<3 hours/day) Can patient physically be transported by private vehicle: Yes   Assistance Recommended at Discharge Intermittent Supervision/Assistance  Patient can return home with the following A lot of help with bathing/dressing/bathroom;Assistance with cooking/housework;Assist for transportation;Help with stairs or ramp for entrance;A little help with walking and/or transfers   Equipment Recommendations  None recommended by PT    Recommendations for Other Services       Precautions / Restrictions Precautions Precautions: Fall Restrictions Weight Bearing Restrictions: No RLE Weight Bearing: Weight bearing as tolerated     Mobility  Bed Mobility Overal bed mobility: Modified Independent Bed Mobility: Supine to Sit     Supine to sit: Modified independent  (Device/Increase time)          Transfers Overall transfer level: Needs assistance Equipment used: Rolling walker (2 wheels) Transfers: Sit to/from Stand Sit to Stand: Min guard           General transfer comment: VCs hand placement, able to wipe self after urinating    Ambulation/Gait Ambulation/Gait assistance: Min guard Gait Distance (Feet): 60 Feet Assistive device: Rolling walker (2 wheels) Gait Pattern/deviations: Decreased step length - right, Decreased step length - left Gait velocity: decreased     General Gait Details: pt ambulated with 4L O2, unable to obtain SpO2 as pulse oximeter did not get a reading (noted pt's fingers were very cold), steady with RW, VCs for positioning in RW   Stairs             Wheelchair Mobility    Modified Rankin (Stroke Patients Only)       Balance Overall balance assessment: Needs assistance   Sitting balance-Leahy Scale: Good     Standing balance support: During functional activity, Bilateral upper extremity supported, Reliant on assistive device for balance Standing balance-Leahy Scale: Fair                              Cognition Arousal/Alertness: Awake/alert Behavior During Therapy: WFL for tasks assessed/performed, Flat affect Overall Cognitive Status: Within Functional Limits for tasks assessed                                          Exercises General Exercises - Lower Extremity Ankle Circles/Pumps: AROM, Both, 15 reps, Supine Long Arc Quad: AROM, Right, 10 reps,  Seated Heel Slides: AAROM, Right, 10 reps, Supine Hip ABduction/ADduction: AAROM, Right, 10 reps, Supine    General Comments        Pertinent Vitals/Pain Pain Assessment Pain Assessment: Faces Faces Pain Scale: Hurts a little bit Pain Location: R hip with movement Pain Descriptors / Indicators: Grimacing, Guarding, Operative site guarding Pain Intervention(s): Limited activity within patient's tolerance,  Monitored during session, Repositioned    Home Living                          Prior Function            PT Goals (current goals can now be found in the care plan section) Acute Rehab PT Goals Patient Stated Goal: Rehab and then home with son PT Goal Formulation: With patient Time For Goal Achievement: 11/11/22 Potential to Achieve Goals: Good Progress towards PT goals: Progressing toward goals    Frequency    Min 3X/week      PT Plan Current plan remains appropriate    Co-evaluation              AM-PAC PT "6 Clicks" Mobility   Outcome Measure  Help needed turning from your back to your side while in a flat bed without using bedrails?: A Little Help needed moving from lying on your back to sitting on the side of a flat bed without using bedrails?: A Little Help needed moving to and from a bed to a chair (including a wheelchair)?: A Little Help needed standing up from a chair using your arms (e.g., wheelchair or bedside chair)?: A Little Help needed to walk in hospital room?: A Little Help needed climbing 3-5 steps with a railing? : A Lot 6 Click Score: 17    End of Session Equipment Utilized During Treatment: Gait belt Activity Tolerance: Patient limited by fatigue Patient left: in chair;with call bell/phone within reach Nurse Communication: Mobility status PT Visit Diagnosis: Difficulty in walking, not elsewhere classified (R26.2);Pain Pain - Right/Left: Right Pain - part of body: Hip     Time: 1205-1228 PT Time Calculation (min) (ACUTE ONLY): 23 min  Charges:  $Gait Training: 8-22 mins $Therapeutic Exercise: 8-22 mins                     Blondell Reveal Kistler PT 11/04/2022  Acute Rehabilitation Services  Office [phone]

## 2022-11-04 NOTE — Progress Notes (Signed)
[id number] PT reports that patient's hands were too cold to correctly register oxygen sats with ambulation. At this time, patient sitting in recliner chair. Oxygen saturations in middle to upper 80's. Coughing, deep breathing, and incentive spirometer use encouraged. Saturations increased to 91% in chair. Patient assisted to standing and saturations increased to 94%. Several steps taken at bedside and then patient assisted into bed. Peri care provided by RN and aide. Son enters at end of activity. Son updated regarding patient's status and educated regarding importance of activity for breathing. Patient and son verbalize understanding.  ~48 Son steps into hall and requests help. States that "oxygen in making leaking sound." Oxygen tubing connected incorrectly into wall and pure wick observed lying on bed beside patient. Oxygen tubing and pure wick reapplied; instructed patient and family to call for assistance with devices.  Ivan Anchors, RN 11/04/22 6:10 PM

## 2022-11-05 MED ORDER — PANTOPRAZOLE SODIUM 40 MG PO TBEC: 40.0000 mg | DELAYED_RELEASE_TABLET | Freq: Every day | ORAL | 0 refills | Status: DC

## 2022-11-05 MED ORDER — HYDROCHLOROTHIAZIDE 12.5 MG PO TABS: 12.5000 mg | ORAL_TABLET | Freq: Every day | ORAL | Status: DC

## 2022-11-05 MED ORDER — SORBITOL 70 % SOLN: 30.0000 mL | Freq: Every day | Status: DC

## 2022-11-05 NOTE — Plan of Care (Signed)
Problem: Education: Goal: Knowledge of General Education information will improve Description: Including pain rating scale, medication(s)/side effects and non-pharmacologic comfort measures Outcome: Progressing   Problem: Clinical Measurements: Goal: Ability to maintain clinical measurements within normal limits will improve Outcome: Progressing   Problem: Activity: Goal: Risk for activity intolerance will decrease Outcome: [place], RN 11/05/22 10:42 AM

## 2022-11-05 NOTE — Discharge Summary (Addendum)
Physician Discharge Summary  Brenda Gibson ZOX:[id number] DOB: 1941/03/28 DOA: 10/25/2022  PCP: Oneita Hurt, No  Admit date: 10/25/2022 Discharge date: 11/08/2022  Patient seen and examined and requisite changes made to the discharge summary  Time spent: 32 minutes  Recommendations for Outpatient Follow-up:  Needs CBC, Chem-12 in about 1 week and magnesium Will keep on proper bowel regimen with meds as listed below as she has severe constipation so will need sorbitol senna and if necessary add Dulcolax  Discharge Diagnoses:  MAIN problem for hospitalization   Hip fracture SBO Effusions and hypoxia COPD Severe protein energy malnutrition HTN  Please see below for itemized issues addressed in HOpsital- refer to other progress notes for clarity if needed  Discharge Condition: Improved  Diet recommendation: Heart healthy  Filed Weights   10/25/22 2144 10/27/22 1719  Weight: 54.4 kg 54.4 kg    History of present illness:  82 year old white female Underlying COPD continued tobacco 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  Pleural effusions which resolved 12/31:-General surgery consulted same day  Hospital Course:  Right-sided hip fracture status post IM nail as above - Discontinue morphine -- use Oxy IR and Tylenol combination -Currently is on Lovenox, on discharge use aspirin 81 twice daily until 2/8 -Weightbearing precautions as per orthopedics   Partial SBO versus ileus  and severe constipation - Conservative management as per general surgery who saw patient previously - miralax changed to sorbitol--add dulcolax -Good effect with constipation and passed good stool  COPD Bilateral pleural effusions - Given IV fluid initially, currently +2.6L - lasix held, on HCTZ now preferentially -Desat screen performed and does not need oxygen can discharge home without   Grade 1 diastolic dysfunction with pleural effusions as above - As above   Hypokalemia likely related to diuresis - Will need outpatient recheck of labs and magnesium   Severe protein energy malnutrition BMI 19 - Dietary counseling   History hypertension -Started HCTZ 12.5 holding amlodipine 5 given preference and need for mild diuresis  Procedures:   Discharge Exam: Vitals:   11/04/22 2230 11/05/22 0507  BP: 138/63 129/65  Pulse: 63 80  Resp: 15 16  Temp: 97.9 F (36.6 C) 98.1 F (36.7 C)  SpO2: 100% 90%    Subj on day of d/c   Awake coherent no distress sitting up in bed no fever no nausea no vomiting no chills no cough no cold  Has regular bowel movement   Discharge Instructions   Discharge Instructions     Diet - low sodium heart healthy   Complete by: As directed    Increase activity slowly   Complete by: As directed       Allergies as of 11/05/2022   No Known Allergies      Medication List     STOP taking these medications    albuterol 108 (90 Base) MCG/ACT inhaler Commonly known as: VENTOLIN HFA   amLODipine 5 MG tablet Commonly known as: NORVASC   cephALEXin 500 MG capsule Commonly known as: KEFLEX   mometasone-formoterol 200-5 MCG/ACT Aero Commonly known as: DULERA  naproxen sodium 220 MG tablet Commonly known as: ALEVE   phenazopyridine 200 MG tablet Commonly known as: PYRIDIUM   predniSONE 10 MG tablet Commonly known as: DELTASONE   Spiriva HandiHaler 18 MCG inhalation capsule Generic drug: tiotropium       TAKE these  medications    acetaminophen 500 MG tablet Commonly known as: TYLENOL Take 2 tablets (1,000 mg total) by mouth every 8 (eight) hours for 21 days.   aspirin EC 81 MG tablet Take 1 tablet (81 mg total) by mouth in the morning and at bedtime. Swallow whole.   hydrochlorothiazide 12.5 MG tablet Commonly known as: HYDRODIURIL Take 1 tablet (12.5 mg total) by mouth daily.   pantoprazole 40 MG tablet Commonly known as: PROTONIX Take 1 tablet (40 mg total) by mouth daily.   senna 8.6 MG Tabs tablet Commonly known as: SENOKOT Take 1 tablet (8.6 mg total) by mouth 2 (two) times daily for 21 days.   sorbitol 70 % Soln Take 30 mLs by mouth daily.       ASK your doctor about these medications    methocarbamol 750 MG tablet Commonly known as: Robaxin-750 Take 1 tablet (750 mg total) by mouth 4 (four) times daily for 7 days. Ask about: Should I take this medication?   oxyCODONE 5 MG immediate release tablet Commonly known as: Oxy IR/ROXICODONE Take 1 tablet (5 mg total) by mouth every 4 (four) hours as needed for up to 7 days for moderate pain or severe pain. Ask about: Should I take this medication?       No Known Allergies    The results of significant diagnostics from this hospitalization (including imaging, microbiology, ancillary and laboratory) are listed below for reference.    Significant Diagnostic Studies: DG Abd Portable 1V  Result Date: 11/02/2022 CLINICAL DATA:  Partial small bowel obstruction. EXAM: PORTABLE ABDOMEN - 1 VIEW COMPARISON:  Radiograph and CT 10/31/2022 FINDINGS: Gaseous distention of small bowel in the central abdomen up to 4.3 cm. There is slight improvement in small bowel distension from prior imaging. Air and stool mixed throughout the colon. No obvious free air. Vascular calcifications. IMPRESSION: Slight improvement in small bowel distension from prior imaging. Persistent gaseous distention of small bowel in the central abdomen. Electronically  Signed   By: Narda Rutherford M.D.   On: 11/02/2022 15:32   DG CHEST PORT 1 VIEW  Result Date: 11/02/2022 CLINICAL DATA:  Bilateral pleural effusion. EXAM: PORTABLE CHEST 1 VIEW COMPARISON:  Chest radiograph 10/26/2022. Lung bases from abdominal CT 10/31/2022 FINDINGS: Hazy bibasilar opacities present bilateral pleural effusions, more so on the right. Findings are grossly stable from recent abdominal CT allowing for differences in modality stable heart size and mediastinal contours. Compressive atelectasis suspected related to pleural effusions, no other focal airspace disease. No pneumothorax. No pulmonary edema. On limited assessment, no acute osseous findings. IMPRESSION: Bilateral pleural effusions, more so on the right. Findings are grossly stable from recent abdominal CT. Electronically Signed   By: Narda Rutherford M.D.   On: 11/02/2022 15:30   CT ABDOMEN PELVIS WO CONTRAST  Result Date: 10/31/2022 CLINICAL DATA:  Distension. Abdominal pain. Status post ORIF of right femoral neck fracture 4 days ago. EXAM: CT ABDOMEN AND PELVIS WITHOUT CONTRAST TECHNIQUE: Multidetector CT imaging of the abdomen and pelvis was performed following the standard protocol without IV contrast. RADIATION DOSE REDUCTION: This exam was performed according to the departmental dose-optimization program which includes automated exposure control, adjustment of the mA and/or kV according  to patient size and/or use of iterative reconstruction technique. COMPARISON:  Abdomen 10/21/2022. CT of the abdomen and pelvis 05/24/2021. FINDINGS: Lower chest: New moderate bilateral pleural effusions are present. Previously seen 10 mm pleural based nodule in the left lower lobe is stable. No additional follow-up is necessary. New bibasilar airspace opacities are associated with the effusions, likely atelectasis. Heart size is normal. No significant pericardial effusion is present. Marked wall thickening is present about the distal esophagus.  Contrast is present in the distal esophagus. No discrete mass lesion is present. Hepatobiliary: Layering gallstones are present. No definite inflammatory scratched at no focal inflammatory changes are present about the gallbladder. Common bile duct is within normal limits. Liver is unremarkable. Pancreas: Unremarkable. No pancreatic ductal dilatation or surrounding inflammatory changes. Spleen: Normal in size without focal abnormality. Adrenals/Urinary Tract: Adrenal glands are normal bilaterally. Kidneys are unremarkable. No stone or mass lesion is present. Ureters are within normal limits bilaterally. Urinary bladder is dilated. No discrete lesion is present. Stomach/Bowel: The stomach is mildly distended. Duodenum is unremarkable. Proximal loops of small bowel are dilated up to 4 cm with fluid levels. Stool like material is present in the small bowel in the low anterior pelvis. A new 18 mm fluid collection extends into the left inguinal canal. This could represent a small loop of bowel as this is near the transition point. More likely a represents a small amount of fluid extending into the left inguinal canal. The more distal small bowel is collapsed. Gas and stool are present throughout the colon. No focal bowel inflammation is present. No pneumatosis is present. Vascular/Lymphatic: Extensive vascular calcifications are present. No aneurysm is present. No significant adenopathy is present. Reproductive: Uterus is deviated to the right. No focal lesions are present. No free air is present. Other: No other significant ventral hernia is present. No free air is present. Musculoskeletal: No acute or significant osseous findings. IMPRESSION: 1. Multiple dilated loops of small bowel is seen on prior studies. More normal caliber distal small bowel suggests at least a partial small bowel obstruction. 2. 18 mm fluid collection in the left inguinal canal may represent focal ascites. Although incarcerated hernia is not  excluded, bowel does not seem to dilate up to this point in this seems less likely. 3. Stool like material within small bowel in the low anterior pelvis suggests slow transit and may be near the transition point. 4. New moderate bilateral pleural effusions with associated bibasilar airspace opacities, likely atelectasis. 5. Marked wall thickening about the distal esophagus compatible with esophagitis. No definite mass lesion is present. Recommend follow-up endoscopy. 6. Cholelithiasis without evidence for cholecystitis. 7. Stable 10 mm pleural based nodule in the left lower lobe. No additional follow-up is necessary. These results were called by telephone at the time of interpretation on 10/31/2022 at 3:24 pm to provider [facility] PATEL , who verbally acknowledged these results. Electronically Signed   By: Marin Roberts M.D.   On: 10/31/2022 15:28   DG Abd Portable 1V  Result Date: 10/31/2022 CLINICAL DATA:  82 year old female postoperative day 4 right hip surgery. Constipation. EXAM: PORTABLE ABDOMEN - 1 VIEW COMPARISON:  Abdominal radiographs 05/28/2021 and earlier. FINDINGS: Portable AP supine view at 0814 hours. Proximal right femur ORIF with proximal dynamic hip screw. Lung bases appear negative. Widespread gas-filled small and large bowel loops throughout the abdomen and pelvis, although appearance not significantly changed from radiographs last year. Mild to moderate volume of retained stool also similar to prior studies. IMPRESSION: Widespread gas-filled  bowel loops throughout the abdomen, but not significantly changed from last year. Top differential considerations include chronic decreased bowel motility versus postoperative ileus. Electronically Signed   By: Odessa Fleming M.D.   On: 10/31/2022 11:30   DG FEMUR, MIN 2 VIEWS RIGHT  Result Date: 10/28/2022 CLINICAL DATA:  ORIF right hip fracture EXAM: RIGHT FEMUR 2 VIEWS COMPARISON:  10/27/1999 23 FINDINGS: Postop changes. Compression screw and  lateral plate proximal femur. Right femoral neck fracture in satisfactory alignment. Hardware in satisfactory position Mild degenerative change in the right hip joint with spurring of the femoral head. No complication identified. IMPRESSION: ORIF right femoral neck fracture. Electronically Signed   By: Marlan Palau M.D.   On: 10/28/2022 07:43   DG HIP UNILAT WITH PELVIS 2-3 VIEWS RIGHT  Result Date: 10/27/2022 CLINICAL DATA:  Right hip intramedullary nail. EXAM: DG HIP (WITH OR WITHOUT PELVIS) 2-3V RIGHT COMPARISON:  None Available. FINDINGS: Multiple intraoperative fluoroscopic images for right femoral neck intramedullary nail placement. Total fluoroscopic time was 1 minute 4 seconds and fluoroscopic dose was 11.043 mGy. IMPRESSION: Multiple intraoperative fluoroscopic images for right femoral neck intramedullary nail placement. Electronically Signed   By: Larose Hires D.O.   On: 10/27/2022 23:45   DG C-Arm 1-60 Min-No Report  Result Date: 10/27/2022 Fluoroscopy was utilized by the requesting physician.  No radiographic interpretation.   DG C-Arm 1-60 Min-No Report  Result Date: 10/27/2022 Fluoroscopy was utilized by the requesting physician.  No radiographic interpretation.   ECHOCARDIOGRAM COMPLETE  Result Date: 10/27/2022    ECHOCARDIOGRAM REPORT   Patient Name:   CLELLA MCCOSH Date of Exam: 10/27/2022 Medical Rec #:  [id number]       Height:       65.0 in Accession #:    [id number]      Weight:       120.0 lb Date of Birth:  1941-04-05        BSA:          1.592 m Patient Age:    81 years        BP:           145/65 mmHg Patient Gender: F               HR:           68 bpm. Exam Location:  Inpatient Procedure: 2D Echo Indications:    Abnormal ECG; Grade 1 Diastolic Dysfunction  History:        Patient has prior history of Echocardiogram examinations, most                 recent 05/27/2021. COPD; Risk Factors:Hypertension.  Sonographer:    Cathie Hoops Referring Phys: [id number] DAVID MANUEL  ORTIZ  Sonographer Comments: Technically difficult study due to poor echo windows. Image acquisition challenging due to respiratory motion and Image acquisition challenging due to COPD. IMPRESSIONS  1. Difficult study due to poor echo windows.  2. Left ventricular ejection fraction, by estimation, is 65 to 70%. The left ventricle has normal function. The left ventricle has no regional wall motion abnormalities. There is mild concentric left ventricular hypertrophy. Left ventricular diastolic parameters are consistent with Grade I diastolic dysfunction (impaired relaxation).  3. Right ventricular systolic function is normal. The right ventricular size is not well visualized. There is mildly elevated pulmonary artery systolic pressure. The estimated right ventricular systolic pressure is 38.0 mmHg.  4. The mitral valve is grossly normal. Trivial mitral valve regurgitation. No evidence of  mitral stenosis.  5. The aortic valve is tricuspid. There is mild calcification of the aortic valve. There is mild thickening of the aortic valve. Aortic valve regurgitation is not visualized. Aortic valve sclerosis/calcification is present, without any evidence of aortic stenosis.  6. The inferior vena cava is dilated in size with >50% respiratory variability, suggesting right atrial pressure of 8 mmHg. Comparison(s): No significant change from prior study. FINDINGS  Left Ventricle: Left ventricular ejection fraction, by estimation, is 65 to 70%. The left ventricle has normal function. The left ventricle has no regional wall motion abnormalities. The left ventricular internal cavity size was normal in size. There is  mild concentric left ventricular hypertrophy. Left ventricular diastolic parameters are consistent with Grade I diastolic dysfunction (impaired relaxation). Right Ventricle: The right ventricular size is not well visualized. Right vetricular wall thickness was not well visualized. Right ventricular systolic function is  normal. There is mildly elevated pulmonary artery systolic pressure. The tricuspid regurgitant velocity is 2.96 m/s, and with an assumed right atrial pressure of 3 mmHg, the estimated right ventricular systolic pressure is 38.0 mmHg. Left Atrium: Left atrial size was normal in size. Right Atrium: Right atrial size was not well visualized. Pericardium: There is no evidence of pericardial effusion. Mitral Valve: The mitral valve is grossly normal. There is mild thickening of the mitral valve leaflet(s). There is mild calcification of the mitral valve leaflet(s). Trivial mitral valve regurgitation. No evidence of mitral valve stenosis. Tricuspid Valve: The tricuspid valve is normal in structure. Tricuspid valve regurgitation is trivial. Aortic Valve: The aortic valve is tricuspid. There is mild calcification of the aortic valve. There is mild thickening of the aortic valve. Aortic valve regurgitation is not visualized. Aortic valve sclerosis/calcification is present, without any evidence of aortic stenosis. Pulmonic Valve: The pulmonic valve was not well visualized. Pulmonic valve regurgitation is not visualized. Aorta: The aortic root is normal in size and structure. Venous: The inferior vena cava is dilated in size with greater than 50% respiratory variability, suggesting right atrial pressure of 8 mmHg. IAS/Shunts: The atrial septum is grossly normal.  LEFT VENTRICLE PLAX 2D LVIDd:         3.50 cm     Diastology LVIDs:         2.40 cm     LV e' medial:    4.57 cm/s LV PW:         1.00 cm     LV E/e' medial:  15.1 LV IVS:        1.00 cm     LV e' lateral:   4.79 cm/s LVOT diam:     1.90 cm     LV E/e' lateral: 14.4 LV SV:         71 LV SV Index:   45 LVOT Area:     2.84 cm  LV Volumes (MOD) LV vol d, MOD A2C: 36.7 ml LV vol d, MOD A4C: 50.4 ml LV vol s, MOD A2C: 15.3 ml LV vol s, MOD A4C: 19.7 ml LV SV MOD A2C:     21.4 ml LV SV MOD A4C:     50.4 ml LV SV MOD BP:      26.9 ml RIGHT VENTRICLE RV S prime:     22.20  cm/s TAPSE (M-mode): 2.9 cm LEFT ATRIUM             Index        RIGHT ATRIUM          Index  LA diam:        2.70 cm 1.70 cm/m   RA Area:     6.16 cm LA Vol (A2C):   20.8 ml 13.06 ml/m  RA Volume:   10.10 ml 6.34 ml/m LA Vol (A4C):   34.6 ml 21.73 ml/m LA Biplane Vol: 29.0 ml 18.21 ml/m  AORTIC VALVE             PULMONIC VALVE LVOT Vmax:   125.00 cm/s PV Vmax:       0.94 m/s LVOT Vmean:  76.700 cm/s PV Peak grad:  3.5 mmHg LVOT VTI:    0.250 m  AORTA Ao Root diam: 3.30 cm MITRAL VALVE                TRICUSPID VALVE MV Area (PHT): 2.87 cm     TR Peak grad:   35.0 mmHg MV Decel Time: 264 msec     TR Vmax:        296.00 cm/s MV E velocity: 69.00 cm/s MV A velocity: 100.00 cm/s  SHUNTS MV E/A ratio:  0.69         Systemic VTI:  0.25 m                             Systemic Diam: 1.90 cm Laurance Flatten MD Electronically signed by Laurance Flatten MD Signature Date/Time: 10/27/2022/11:07:35 AM    Final    DG Chest Portable 1 View  Result Date: 10/26/2022 CLINICAL DATA:  Preop for surgery EXAM: PORTABLE CHEST 1 VIEW COMPARISON:  12/29/2021 FINDINGS: Lungs are hyperinflated as can be seen with COPD. No focal consolidation. No pleural effusion or pneumothorax. Heart and mediastinal contours are unremarkable. No acute osseous abnormality. IMPRESSION: No active disease. Electronically Signed   By: Elige Ko M.D.   On: 10/26/2022 08:50   CT Hip Right Wo Contrast  Result Date: 10/26/2022 CLINICAL DATA:  Right hip injury with equivocal x-rays for possible fracture. EXAM: CT OF THE RIGHT HIP WITHOUT CONTRAST TECHNIQUE: Multidetector CT imaging of the right hip was performed according to the standard protocol. Multiplanar CT image reconstructions were also generated. RADIATION DOSE REDUCTION: This exam was performed according to the departmental dose-optimization program which includes automated exposure control, adjustment of the mA and/or kV according to patient size and/or use of iterative reconstruction  technique. COMPARISON:  AP pelvis and right hip plain films from yesterday. FINDINGS: Bones/Joint/Cartilage Osteopenia. There is acute mildly impacted subcapital proximal right femoral neck fracture, which is otherwise nondisplaced. The fracture may be incomplete as it is better seen along superior cortex of the bone than inferiorly. Also noted is a longitudinal oblique nondisplaced right pubic bone fracture and nondisplaced oblique fracture in the right superior pubic ramus. There is background moderately advanced degenerative arthrosis of the right hip, spurring changes at the pubic symphysis. Degenerative subcortical cystic changes are noted of the anterior column of right acetabulum, and small accessory ossicles or nondisplaced chronic chip fractures are noted of the anterior lip of the acetabulum and the posterolateral edge of it. There is no joint effusion. Ligaments Suboptimally assessed by CT. Muscles and Tendons No acute abnormality within the limits of noncontrast CT technique. Soft tissues There is subcutaneous stranding in the lateral right hip region most likely traumatic etiology. There is moderate iliofemoral calcific arteriosclerosis. No pelvic sidewall hematoma is seen, no pelvic adenopathy or mass. There is no free fluid in the right hemipelvis. No incarcerated hernia. IMPRESSION:  1. Acute mildly impacted subcapital proximal right femoral neck fracture, which may be incomplete as it is better seen along the superior cortex of the bone than inferiorly. 2. Nondisplaced longitudinal oblique fracture of the right pubic bone and nondisplaced oblique fracture of the right superior pubic ramus. 3. Osteopenia and degenerative change. 4. Calcific arteriosclerosis. Electronically Signed   By: Almira Bar M.D.   On: 10/26/2022 05:53   DG Hip Unilat W or Wo Pelvis 2-3 Views Right  Result Date: 10/25/2022 CLINICAL DATA:  Right leg and hip pain after fall EXAM: DG HIP (WITH OR WITHOUT PELVIS) 2-3V RIGHT  COMPARISON:  None Available. FINDINGS: Demineralization. Irregularity about the subcapital right femoral head neck junction suspicious for nondisplaced fracture. Moderate to advanced arthritis right hip and advanced degenerative arthritis left hip. IMPRESSION: Irregularity about the subcapital right femoral head neck junction suspicious for nondisplaced fracture. Consider CT for confirmation. Electronically Signed   By: Minerva Fester M.D.   On: 10/25/2022 22:44   DG FEMUR PORT, 1V RIGHT  Result Date: 10/25/2022 CLINICAL DATA:  Right leg and hip pain after fall EXAM: RIGHT FEMUR PORTABLE 1 VIEW COMPARISON:  CT abdomen and pelvis 05/24/2021 FINDINGS: Demineralization. Irregularity about the femoral head neck junction is suspicious for subcapital fracture. 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 IMPRESSION: Findings suspicious for nondisplaced subcapital femoral neck fracture. Consider CT for confirmation. Electronically Signed   By: Minerva Fester M.D.   On: 10/25/2022 22:43    Microbiology: Recent Results (from the past 240 hour(s))  Surgical pcr screen     Status: None   Collection Time: 10/26/22  9:15 PM   Specimen: Nasal Mucosa; Nasal Swab  Result Value Ref Range Status   MRSA, PCR NEGATIVE NEGATIVE Final   Staphylococcus aureus NEGATIVE NEGATIVE Final    Comment: (NOTE) The Xpert SA Assay (FDA approved for NASAL specimens in patients 9 years of age and older), is one component of a comprehensive surveillance program. It is not intended to diagnose infection nor to guide or monitor treatment. Performed at [facility], [address] [place], [place] [address]      Labs: Basic Metabolic Panel: Recent Labs  Lab 10/31/22 0327 11/01/22 1057 11/02/22 0337 11/03/22 0340 11/04/22 0320  NA 136 131* 132* 134* 135  K 3.8 4.0 3.8 3.2* 3.3*  CL 95* 95* 96* 92* 92*  CO2 34* 29 29 32 35*  GLUCOSE 145* 94 105* 110* 128*  BUN 31* 22 15 13 11    CREATININE 0.58 0.45 0.46 0.45 0.51  CALCIUM 8.9 8.1* 8.1* 8.2* 8.5*  MG  --  1.9 2.1 2.1 2.1   Liver Function Tests: No results for input(s): "AST", "ALT", "ALKPHOS", "BILITOT", "PROT", "ALBUMIN" in the last 168 hours. No results for input(s): "LIPASE", "AMYLASE" in the last 168 hours. No results for input(s): "AMMONIA" in the last 168 hours. CBC: Recent Labs  Lab 10/30/22 0405 10/31/22 0327 11/01/22 1057 11/02/22 0337 11/03/22 0340  WBC 13.0* 19.7* 16.1* 10.4 10.4  HGB 11.6* 11.1* 9.5* 9.4* 9.9*  HCT 37.7 35.6* 30.7* 30.6* 33.2*  MCV 85.9 85.4 86.2 86.7 87.4  PLT 375 360 305 330 391   Cardiac Enzymes: No results for input(s): "CKTOTAL", "CKMB", "CKMBINDEX", "TROPONINI" in the last 168 hours. BNP: BNP (last 3 results) No results for input(s): "BNP" in the last 8760 hours.  ProBNP (last 3 results) No results for input(s): "PROBNP" in the last 8760 hours.  CBG: No results for input(s): "GLUCAP" in the  last 168 hours.     Signed:  Rhetta Mura MD   [facility] 11/05/2022, 10:14 AM

## 2022-11-05 NOTE — TOC Progression Note (Signed)
Transition of Care [facility] - Progression Note    Patient Details  Name: Brenda Gibson MRN: [id number] Date of Birth: 1941/04/03  Transition of Care [facility] CM/SW Contact  Lennart Pall, LCSW Phone Number: 11/05/2022, 4:30 PM  Clinical Narrative:    Have reviewed SNF bed offers with pt/ son and bed accepted at [facility] who can admit pt on Monday.  MD aware.   Expected Discharge Plan: Skilled Nursing Facility Barriers to Discharge: Continued Medical Work up, SNF Pending bed offer  Expected Discharge Plan and Services     Post Acute Care Choice: [place] Living arrangements for the past 2 months:  (RV with son) Expected Discharge Date: 11/03/22                                     Social Determinants of Health (SDOH) Interventions SDOH Screenings   Food Insecurity: No Food Insecurity (10/26/2022)  Housing: Low Risk  (10/26/2022)  Transportation Needs: No Transportation Needs (10/26/2022)  Utilities: Not At Risk (10/26/2022)  Tobacco Use: High Risk (10/29/2022)    Readmission Risk Interventions     No data to display

## 2022-11-05 NOTE — Progress Notes (Signed)
Called in room by patient. She had pulled out her IV and wanted the kerlix dressing removed that had been protecting it. Upon entering the room, patient was sitting up in bed, extremely calm, with unlabored respirations.  I greeted patient and her son immediately woke up and jumped up off bench yelling "she's having a panic attack!!" repeatedly.  I calmly informed the son that his mother was not having a panic attack.  Patient then requested to ambulate to the bathroom.  Son became very overbearing and was speaking with rapid excited speech. Patient appeared annoyed and just kept telling him to be quiet. Patient was calmly ambulated to the bathroom, she did extremely well, and when I placed her back in bed she wanted to sit on the side of the bed. Patient's son told me he would be up watching her and that he would call us when she was ready to lay back down so that we could set the bed alarm.  At this point, patient's own anxiety level seemed increased by her son's behavior.

## 2022-11-06 DIAGNOSIS — S72001A Fracture of unspecified part of neck of right femur, initial encounter for closed fracture: Secondary | ICD-10-CM | POA: Diagnosis not present

## 2022-11-06 LAB — MAGNESIUM: Magnesium: 2 mg/dL (ref 1.7–2.4)

## 2022-11-06 LAB — BASIC METABOLIC PANEL
Anion gap: 14 (ref 5–15)
BUN: 19 mg/dL (ref 8–23)
CO2: 37 mmol/L — ABNORMAL HIGH (ref 22–32)
Calcium: 8.7 mg/dL — ABNORMAL LOW (ref 8.9–10.3)
Chloride: 83 mmol/L — ABNORMAL LOW (ref 98–111)
Creatinine, Ser: 0.62 mg/dL (ref 0.44–1.00)
GFR, Estimated: >60 mL/min (ref >60)
Glucose, Bld: 115 mg/dL — ABNORMAL HIGH (ref 70–99)
Potassium: 2.8 mmol/L — ABNORMAL LOW (ref 3.5–5.1)
Sodium: 134 mmol/L — ABNORMAL LOW (ref 135–145)

## 2022-11-06 MED ORDER — POTASSIUM CHLORIDE CRYS ER 20 MEQ PO TBCR
40.0000 meq | EXTENDED_RELEASE_TABLET | Freq: Three times a day (TID) | ORAL | Status: DC
  Administered 2022-11-06 (×3): 40 meq via ORAL
  Filled 2022-11-06 (×3): qty 2

## 2022-11-06 NOTE — Plan of Care (Signed)
  Problem: Education: Goal: Knowledge of General Education information will improve Description: Including pain rating scale, medication(s)/side effects and non-pharmacologic comfort measures Outcome: Progressing   Problem: Activity: Goal: Risk for activity intolerance will decrease Outcome: Progressing   Problem: Pain Managment: Goal: General experience of comfort will improve Outcome: Progressing   

## 2022-11-06 NOTE — Progress Notes (Signed)
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  Pleural effusions which resolved 12/31:-General surgery consulted same day    Hospital-Problem based course  Right-sided hip fracture status post IM nail as above - Discontinue morphine -- use Oxy IR and Tylenol combination -Currently is on Lovenox, on discharge use aspirin 81 twice daily until 2/8 -Weightbearing precautions as per orthopedics  Partial SBO versus ileus and severe constipation - Conservative management as per general surgery who saw patient previously - miralax changed to sorbitol, passing good stool  COPD Bilateral pleural effusions - Given IV fluid initially, currently 4 L positive - lasix held, on CTZ now preferentially -Desat screen needed prior to discharge if able to take off oxygen will discharge off oxygen  Grade 1 diastolic dysfunction with pleural effusions as above - As above  Hypokalemia likely related to diuresis - increase repalcement to 40 tid, magnesium normalized  Severe protein energy malnutrition BMI 19 - Dietary counseling  History hypertension -now on HCTZ 12.5 holding amlodipine 5  DVT prophylaxis: Lovenox Code Status: Full Family Communication: Discussed with son at bedside Disposition:  Status is:  Inpatient Remains inpatient appropriate because:   Needs to pass flatus stool and be more dry prior to discharge   Consultants:  Orthopedics  Procedures:   Antimicrobials:     Subjective:  Well no distress no fever no cough cold    Objective: Vitals:   11/05/22 2234 11/06/22 0458 11/06/22 1026 11/06/22 1412  BP: (!) 114/53 (!) 136/58 123/65 126/72  Pulse: 73 72 71 66  Resp: '19 18 15 18  '$ Temp: 98.3 F (36.8 C) 98.5 F (36.9 C) (!) 97.5 F (36.4 C) 98.6 F (37 C)  TempSrc:   Oral   SpO2: 96% 94% 91% 99%  Weight:      Height:        Intake/Output Summary (Last 24 hours) at 11/06/2022 1418 Last data filed at 11/06/2022 0600 Gross per 24 hour  Intake 220 ml  Output --  Net 220 ml    Filed Weights   10/25/22 2144 10/27/22 1719  Weight: 54.4 kg 54.4 kg    Examination:  EOMI NCAT n cachectic on oxygen Abdomen distended with some tympany, decreased from prior S1-S2 no murmur no rub no gallop No lower extremity edema able to straight leg raise bilaterally neuro grossly intact  Data Reviewed: personally reviewed   CBC    Component Value Date/Time   WBC 10.4 11/03/2022 0340   RBC 3.80 (L) 11/03/2022 0340   HGB 9.9 (L) 11/03/2022 0340   HCT 33.2 (L) 11/03/2022 0340   PLT 391 11/03/2022 0340   MCV 87.4 11/03/2022 0340   MCH 26.1 11/03/2022 0340   MCHC 29.8 (L) 11/03/2022 0340   RDW 14.6 11/03/2022 0340   LYMPHSABS 1.5 10/26/2022 0857   MONOABS 1.0 10/26/2022 0857   EOSABS 0.0 10/26/2022 0857   BASOSABS 0.1 10/26/2022 0857      Latest Ref  Rng & Units 11/06/2022    3:59 AM 11/04/2022    3:20 AM 11/03/2022    3:40 AM  CMP  Glucose 70 - 99 mg/dL 115  128  110   BUN 8 - 23 mg/dL '19  11  13   '$ Creatinine 0.44 - 1.00 mg/dL 0.62  0.51  0.45   Sodium 135 - 145 mmol/L 134  135  134   Potassium 3.5 - 5.1 mmol/L 2.8  3.3  3.2   Chloride 98 - 111 mmol/L 83  92  92   CO2 22 - 32 mmol/L 37  35  32   Calcium 8.9 - 10.3 mg/dL 8.7  8.5  8.2      Radiology Studies: No  results found.   Scheduled Meds:  (feeding supplement) PROSource Plus  30 mL Oral BID BM   enoxaparin (LOVENOX) injection  40 mg Subcutaneous Q24H   hydrochlorothiazide  12.5 mg Oral Daily   pantoprazole  40 mg Oral Daily   potassium chloride  40 mEq Oral TID   senna-docusate  1 tablet Oral BID   sorbitol  30 mL Oral Daily   zolpidem  5 mg Oral QHS   Continuous Infusions:   LOS: 11 days   Time spent: 58  Nita Sells, MD [facility] To contact the attending provider between 7A-7P or the covering provider during after hours 7P-7A, please log into the web site [email or web address] and access using universal [place] password for that web site. If you do not have the password, please call the hospital operator.  11/06/2022, 2:18 PM

## 2022-11-07 DIAGNOSIS — S72001A Fracture of unspecified part of neck of right femur, initial encounter for closed fracture: Secondary | ICD-10-CM | POA: Diagnosis not present

## 2022-11-07 LAB — BASIC METABOLIC PANEL
Anion gap: 7 (ref 5–15)
BUN: 18 mg/dL (ref 8–23)
CO2: 35 mmol/L — ABNORMAL HIGH (ref 22–32)
Calcium: 8.6 mg/dL — ABNORMAL LOW (ref 8.9–10.3)
Chloride: 94 mmol/L — ABNORMAL LOW (ref 98–111)
Creatinine, Ser: 0.59 mg/dL (ref 0.44–1.00)
GFR, Estimated: >60 mL/min (ref >60)
Glucose, Bld: 114 mg/dL — ABNORMAL HIGH (ref 70–99)
Potassium: 4.4 mmol/L (ref 3.5–5.1)
Sodium: 136 mmol/L (ref 135–145)

## 2022-11-07 NOTE — Plan of Care (Signed)
  Problem: Education: Goal: Knowledge of General Education information will improve Description: Including pain rating scale, medication(s)/side effects and non-pharmacologic comfort measures Outcome: Progressing   Problem: Activity: Goal: Risk for activity intolerance will decrease Outcome: Progressing   Problem: Pain Managment: Goal: General experience of comfort will improve Outcome: Progressing   

## 2022-11-07 NOTE — Progress Notes (Signed)
Assisted patient to Bathroom, she voided and had a small BM, got patient back to the bed and she wanted to sit up on the bedside. Son sat down on the bed beside the patient and requested that bed alarm be left off until she was ready to be put back in bed. Advised son to call as soon as she was ready and we would turn on the bed alarm.

## 2022-11-07 NOTE — Progress Notes (Signed)
Physical Therapy Treatment Patient Details Name: Brenda Gibson MRN: [id number] DOB: 07/28/1941 Today's Date: 11/07/2022   History of Present Illness 81/F with history of hypertension, tobacco abuse, suspected COPD, osteoporosis presented to the ED after mechanical fall with right hip and thigh pain. Found to have nondisplaced subcapital femoral neck fracture, in addition noted to have oblique fracture of like right pubic bone and displaced oblique fracture of right superior pubic ramus  -12/27 underwent internal fixation of hip with screw placement. Pleural effusions.    PT Comments    Pt very subdued this am but agreeable to ambulate in hall.  Pt progressing well with mobility with noted improvement in activity tolerance and decreasing level of assist required for most tasks.  Tx limited by urinary incontinence and pt assisted to bathroom.   Recommendations for follow up therapy are one component of a multi-disciplinary discharge planning process, led by the attending physician.  Recommendations may be updated based on patient status, additional functional criteria and insurance authorization.  Follow Up Recommendations  Skilled nursing-short term rehab (<3 hours/day) Can patient physically be transported by private vehicle: Yes   Assistance Recommended at Discharge Intermittent Supervision/Assistance  Patient can return home with the following A lot of help with bathing/dressing/bathroom;Assistance with cooking/housework;Assist for transportation;Help with stairs or ramp for entrance;A little help with walking and/or transfers   Equipment Recommendations  None recommended by PT    Recommendations for Other Services       Precautions / Restrictions Precautions Precautions: Fall Restrictions Weight Bearing Restrictions: No RLE Weight Bearing: Weight bearing as tolerated     Mobility  Bed Mobility Overal bed mobility: Modified Independent Bed Mobility: Supine to Sit            General bed mobility comments: No physical assist    Transfers Overall transfer level: Needs assistance Equipment used: Rolling walker (2 wheels) Transfers: Sit to/from Stand Sit to Stand: Min guard           General transfer comment: steady assist with min cues for use of UEs    Ambulation/Gait Ambulation/Gait assistance: Min guard Gait Distance (Feet): 60 Feet (twice) Assistive device: Rolling walker (2 wheels) Gait Pattern/deviations: Decreased step length - right, Decreased step length - left Gait velocity: decreased     General Gait Details: frequent cues for posture and position from [employer]             Wheelchair Mobility    Modified Rankin (Stroke Patients Only)       Balance Overall balance assessment: Needs assistance Sitting-balance support: Feet supported, No upper extremity supported Sitting balance-Leahy Scale: Good     Standing balance support: During functional activity, Bilateral upper extremity supported, Reliant on assistive device for balance Standing balance-Leahy Scale: Fair                              Cognition Arousal/Alertness: Awake/alert Behavior During Therapy: Flat affect Overall Cognitive Status: Impaired/Different from baseline                                 General Comments: Son states that pt has been talking to people not there and has informed Therapist, sports.        Exercises      General Comments        Pertinent Vitals/Pain Pain Assessment Pain Assessment: No/denies pain Pain Intervention(s):  Limited activity within patient's tolerance, Monitored during session    Home Living                          Prior Function            PT Goals (current goals can now be found in the care plan section) Acute Rehab PT Goals Patient Stated Goal: Rehab and then home with son PT Goal Formulation: With patient Time For Goal Achievement: 11/11/22 Potential to Achieve Goals:  Good Progress towards PT goals: Progressing toward goals    Frequency    Min 3X/week      PT Plan Current plan remains appropriate    Co-evaluation              AM-PAC PT "6 Clicks" Mobility   Outcome Measure  Help needed turning from your back to your side while in a flat bed without using bedrails?: A Little Help needed moving from lying on your back to sitting on the side of a flat bed without using bedrails?: A Little Help needed moving to and from a bed to a chair (including a wheelchair)?: A Little Help needed standing up from a chair using your arms (e.g., wheelchair or bedside chair)?: A Little Help needed to walk in hospital room?: A Little Help needed climbing 3-5 steps with a railing? : A Lot 6 Click Score: 17    End of Session Equipment Utilized During Treatment: Gait belt Activity Tolerance: Patient tolerated treatment well Patient left: Other (comment) (bathroom with CNA) Nurse Communication: Mobility status PT Visit Diagnosis: Difficulty in walking, not elsewhere classified (R26.2);Pain Pain - Right/Left: Right Pain - part of body: Hip     Time: 1110-1131 PT Time Calculation (min) (ACUTE ONLY): 21 min  Charges:  $Gait Training: 8-22 mins                     [place] Pager [phone] Office [phone]    Khaalid Lefkowitz 11/07/2022, 11:38 AM

## 2022-11-07 NOTE — Progress Notes (Signed)
PROGRESS NOTE   Brenda Gibson  QQI:[id number] DOB: [birth year] DOA: 10/25/2022 PCP: Pcp, No  Brief Narrative:  82 year old white female Underlying COPD continued tobacco Basal cell CA nose with seborrheic dermatitis HTN, schizoaffective disorder with prior hospitalization in the remote past (2002) Lung nodule Known grade 1 diastolic dysfunction Prior SBO status post LOA 06/25/2016 Sustained fall October 21, 2022, brought to ED 12/26 Found to have nondisplaced subcapital right-sided femoral fracture with mild impaction as well as nondisplaced longitudinal oblique fracture right pubic bone  12/27 underwent IM nail right side 12/30-developed constipation-imaging showed ileus-also developed hypoxia showing on x-ray?  Pleural effusions which resolved 12/31:-General surgery consulted same day    Hospital-Problem based course  Right-sided hip fracture status post IM nail as above - Discontinue morphine -- on Oxy IR and Tylenol combination -Currently is on Lovenox, on discharge use aspirin 81 twice daily until 2/8 -Weightbearing precautions as per orthopedics  Partial SBO versus ileus and severe constipation - Conservative management as per general surgery who saw patient previously - miralax changed to sorbitol, passing good stool  COPD Bilateral pleural effusions - Given IV fluid initially, currently +2.6L - lasix held, on HCTZ now preferentially -Desat screen needed prior to discharge if able to take off oxygen will discharge off oxygen  Grade 1 diastolic dysfunction with pleural effusions as above - As above  Hypokalemia likely related to diuresis - resolved  Severe protein energy malnutrition BMI 19 - Dietary counseling  History hypertension -now on HCTZ 12.5 holding amlodipine 5  DVT prophylaxis: Lovenox Code Status: Full Family Communication: Discussed with son at bedside Disposition:  Status is: Inpatient Remains inpatient appropriate because:   Needs to pass  flatus stool and be more dry prior to discharge   Consultants:  Orthopedics  Procedures:   Antimicrobials:     Subjective:  Fair  Now new issues Eating drinking passing stool  Objective: Vitals:   11/06/22 2021 11/07/22 0513 11/07/22 0716 11/07/22 1223  BP: (!) 90/50 116/89 (!) 153/72 132/63  Pulse: 72 70 66 80  Resp: '20 20 20 20  '$ Temp: 97.9 F (36.6 C) 98.2 F (36.8 C) 98.6 F (37 C) 98.7 F (37.1 C)  TempSrc: Oral Oral Oral Oral  SpO2: 100% 94% 90% 93%  Weight:      Height:        Intake/Output Summary (Last 24 hours) at 11/07/2022 1444 Last data filed at 11/07/2022 1000 Gross per 24 hour  Intake 700 ml  Output --  Net 700 ml    Filed Weights   10/25/22 2144 10/27/22 1719  Weight: 54.4 kg 54.4 kg    Examination:  EOMI NCAT cachectic  Abdomen distended but decreased from prior S1-S2 no murmur no rub no gallop No lower extremity edema able to straight leg raise bilaterally neuro grossly intact Neuro intact no focal deficit  Data Reviewed: personally reviewed   CBC    Component Value Date/Time   WBC 10.4 11/03/2022 0340   RBC 3.80 (L) 11/03/2022 0340   HGB 9.9 (L) 11/03/2022 0340   HCT 33.2 (L) 11/03/2022 0340   PLT 391 11/03/2022 0340   MCV 87.4 11/03/2022 0340   MCH 26.1 11/03/2022 0340   MCHC 29.8 (L) 11/03/2022 0340   RDW 14.6 11/03/2022 0340   LYMPHSABS 1.5 10/26/2022 0857   MONOABS 1.0 10/26/2022 0857   EOSABS 0.0 10/26/2022 0857   BASOSABS 0.1 10/26/2022 0857      Latest Ref Rng & Units 11/07/2022    4:07 AM  11/06/2022    3:59 AM 11/04/2022    3:20 AM  CMP  Glucose 70 - 99 mg/dL 114  115  128   BUN 8 - 23 mg/dL '18  19  11   '$ Creatinine 0.44 - 1.00 mg/dL 0.59  0.62  0.51   Sodium 135 - 145 mmol/L 136  134  135   Potassium 3.5 - 5.1 mmol/L 4.4  2.8  3.3   Chloride 98 - 111 mmol/L 94  83  92   CO2 22 - 32 mmol/L 35  37  35   Calcium 8.9 - 10.3 mg/dL 8.6  8.7  8.5      Radiology Studies: No results found.   Scheduled Meds:   (feeding supplement) PROSource Plus  30 mL Oral BID BM   enoxaparin (LOVENOX) injection  40 mg Subcutaneous Q24H   hydrochlorothiazide  12.5 mg Oral Daily   pantoprazole  40 mg Oral Daily   senna-docusate  1 tablet Oral BID   sorbitol  30 mL Oral Daily   Continuous Infusions:   LOS: 12 days   Time spent: [place], MD [facility] To contact the attending provider between 7A-7P or the covering provider during after hours 7P-7A, please log into the web site [email or web address] and access using universal [place] password for that web site. If you do not have the password, please call the hospital operator.  11/07/2022, 2:44 PM

## 2022-11-08 DIAGNOSIS — R2689 Other abnormalities of gait and mobility: Secondary | ICD-10-CM | POA: Diagnosis not present

## 2022-11-08 DIAGNOSIS — Z7189 Other specified counseling: Secondary | ICD-10-CM | POA: Diagnosis not present

## 2022-11-08 DIAGNOSIS — E889 Metabolic disorder, unspecified: Secondary | ICD-10-CM | POA: Diagnosis not present

## 2022-11-08 DIAGNOSIS — F259 Schizoaffective disorder, unspecified: Secondary | ICD-10-CM | POA: Diagnosis not present

## 2022-11-08 DIAGNOSIS — S72044D Nondisplaced fracture of base of neck of right femur, subsequent encounter for closed fracture with routine healing: Secondary | ICD-10-CM | POA: Diagnosis not present

## 2022-11-08 DIAGNOSIS — E639 Nutritional deficiency, unspecified: Secondary | ICD-10-CM | POA: Diagnosis not present

## 2022-11-08 DIAGNOSIS — E46 Unspecified protein-calorie malnutrition: Secondary | ICD-10-CM | POA: Diagnosis not present

## 2022-11-08 DIAGNOSIS — F4320 Adjustment disorder, unspecified: Secondary | ICD-10-CM | POA: Diagnosis not present

## 2022-11-08 DIAGNOSIS — I150 Renovascular hypertension: Secondary | ICD-10-CM | POA: Diagnosis not present

## 2022-11-08 DIAGNOSIS — S72001S Fracture of unspecified part of neck of right femur, sequela: Secondary | ICD-10-CM | POA: Diagnosis not present

## 2022-11-08 DIAGNOSIS — M25551 Pain in right hip: Secondary | ICD-10-CM | POA: Diagnosis not present

## 2022-11-08 DIAGNOSIS — R4589 Other symptoms and signs involving emotional state: Secondary | ICD-10-CM | POA: Diagnosis not present

## 2022-11-08 DIAGNOSIS — D649 Anemia, unspecified: Secondary | ICD-10-CM | POA: Diagnosis not present

## 2022-11-08 DIAGNOSIS — Z23 Encounter for immunization: Secondary | ICD-10-CM | POA: Diagnosis not present

## 2022-11-08 DIAGNOSIS — R531 Weakness: Secondary | ICD-10-CM | POA: Diagnosis not present

## 2022-11-08 DIAGNOSIS — Z7401 Bed confinement status: Secondary | ICD-10-CM | POA: Diagnosis not present

## 2022-11-08 DIAGNOSIS — E43 Unspecified severe protein-calorie malnutrition: Secondary | ICD-10-CM | POA: Diagnosis not present

## 2022-11-08 DIAGNOSIS — F17200 Nicotine dependence, unspecified, uncomplicated: Secondary | ICD-10-CM | POA: Diagnosis not present

## 2022-11-08 DIAGNOSIS — K5900 Constipation, unspecified: Secondary | ICD-10-CM | POA: Diagnosis not present

## 2022-11-08 DIAGNOSIS — F419 Anxiety disorder, unspecified: Secondary | ICD-10-CM | POA: Diagnosis not present

## 2022-11-08 DIAGNOSIS — S7291XD Unspecified fracture of right femur, subsequent encounter for closed fracture with routine healing: Secondary | ICD-10-CM | POA: Diagnosis not present

## 2022-11-08 DIAGNOSIS — R1311 Dysphagia, oral phase: Secondary | ICD-10-CM | POA: Diagnosis not present

## 2022-11-08 DIAGNOSIS — R41841 Cognitive communication deficit: Secondary | ICD-10-CM | POA: Diagnosis not present

## 2022-11-08 DIAGNOSIS — F32A Depression, unspecified: Secondary | ICD-10-CM | POA: Diagnosis not present

## 2022-11-08 DIAGNOSIS — S72001A Fracture of unspecified part of neck of right femur, initial encounter for closed fracture: Secondary | ICD-10-CM | POA: Diagnosis not present

## 2022-11-08 DIAGNOSIS — M6250 Muscle wasting and atrophy, not elsewhere classified, unspecified site: Secondary | ICD-10-CM | POA: Diagnosis not present

## 2022-11-08 DIAGNOSIS — M810 Age-related osteoporosis without current pathological fracture: Secondary | ICD-10-CM | POA: Diagnosis not present

## 2022-11-08 DIAGNOSIS — M6281 Muscle weakness (generalized): Secondary | ICD-10-CM | POA: Diagnosis not present

## 2022-11-08 DIAGNOSIS — S32509D Unspecified fracture of unspecified pubis, subsequent encounter for fracture with routine healing: Secondary | ICD-10-CM | POA: Diagnosis not present

## 2022-11-08 DIAGNOSIS — K567 Ileus, unspecified: Secondary | ICD-10-CM | POA: Diagnosis not present

## 2022-11-08 DIAGNOSIS — R2681 Unsteadiness on feet: Secondary | ICD-10-CM | POA: Diagnosis not present

## 2022-11-08 DIAGNOSIS — J90 Pleural effusion, not elsewhere classified: Secondary | ICD-10-CM | POA: Diagnosis not present

## 2022-11-08 DIAGNOSIS — J449 Chronic obstructive pulmonary disease, unspecified: Secondary | ICD-10-CM | POA: Diagnosis not present

## 2022-11-08 DIAGNOSIS — Z681 Body mass index (BMI) 19 or less, adult: Secondary | ICD-10-CM | POA: Diagnosis not present

## 2022-11-08 DIAGNOSIS — R269 Unspecified abnormalities of gait and mobility: Secondary | ICD-10-CM | POA: Diagnosis not present

## 2022-11-08 DIAGNOSIS — S7291XA Unspecified fracture of right femur, initial encounter for closed fracture: Secondary | ICD-10-CM | POA: Diagnosis not present

## 2022-11-08 DIAGNOSIS — J9601 Acute respiratory failure with hypoxia: Secondary | ICD-10-CM | POA: Diagnosis not present

## 2022-11-08 MED ORDER — ORAL CARE MOUTH RINSE: 15.0000 mL | OROMUCOSAL | Status: DC | PRN

## 2022-11-08 NOTE — TOC Transition Note (Signed)
Transition of Care [facility]) - CM/SW Discharge Note   Patient Details  Name: Brenda Gibson MRN: [id number] Date of Birth: [birth year]  Transition of Care [facility] CM/SW Contact:  Lennart Pall, LCSW Phone Number: 11/08/2022, 1:53 PM   Clinical Narrative:    Pt medically cleared for dc to [facility] today.  Pt and son aware and agreeable.  PTAR called at 1:45pm.  RN to call report to [phone].     Final next level of care: [place] Barriers to Discharge: Barriers Resolved   Patient Goals and CMS Choice      Discharge Placement                Patient chooses bed at: [facility] Patient to be transferred to facility by: [place] Name of family member notified: son, Quillian Quince Patient and family notified of of transfer: 11/08/22  Discharge Plan and Services Additional resources added to the After Visit Summary for       Post Acute Care Choice: [place]          DME Arranged: N/A DME Agency: NA                  Social Determinants of Health ([place]) Interventions SDOH Screenings   Food Insecurity: No Food Insecurity (10/26/2022)  Housing: Low Risk  (10/26/2022)  Transportation Needs: No Transportation Needs (10/26/2022)  Utilities: Not At Risk (10/26/2022)  Tobacco Use: High Risk (10/29/2022)     Readmission Risk Interventions     No data to display

## 2022-11-08 NOTE — Progress Notes (Signed)
Orthopedic Surgery Progress Note   Assessment: Patient is a 82 y.o. female with right femoral neck fracture status post hip screw   Plan: -Operative plans: complete -Staples removed this evening -Can leave incision open to air -Okay to shower and let soap/water run over the wound, but do not submerge the wound -Diet: regular -DVT ppx: per primary, prescribed aspirin '81mg'$  BID for discharge  -Weight bearing status: as tolerated -PT/OT evaluate and treat -Pain control -Anticipated to discharge to SNF this evening  ___________________________________________________________________________  Subjective: No acute events overnight. Still on the floor. Pain in the hip has been getting much better with time. Reports ambulating with tolerable pain. Denies paresthesias and numbness.    Physical Exam:  General: no acute distress, appears stated age Neurologic: alert, answering questions appropriately, following commands Respiratory: unlabored breathing on room air, symmetric chest rise  MSK:   -Right lower extremity  Incision appears well approximated without any erythema, induration, or active/expressible drainage  Staples in place EHL/TA/GSC intact Plantarflexes and dorsiflexes toes Sensation intact to light touch in sural, saphenous, tibial, deep peroneal, and superficial peroneal nerve distributions Foot warm and well perfused   Patient name: Brenda Gibson Patient MRN: [id number] Date: 11/08/22

## 2022-11-08 NOTE — Care Management Important Message (Signed)
Important Message  Patient Details IM Letter given. Name: Brenda Gibson MRN: [id number] Date of Birth: [birth year]   Medicare Important Message Given:  Yes     Kerin Salen 11/08/2022, 3:14 PM

## 2022-11-09 DIAGNOSIS — E43 Unspecified severe protein-calorie malnutrition: Secondary | ICD-10-CM | POA: Diagnosis not present

## 2022-11-09 DIAGNOSIS — R41841 Cognitive communication deficit: Secondary | ICD-10-CM | POA: Diagnosis not present

## 2022-11-09 DIAGNOSIS — D649 Anemia, unspecified: Secondary | ICD-10-CM | POA: Diagnosis not present

## 2022-11-09 DIAGNOSIS — E889 Metabolic disorder, unspecified: Secondary | ICD-10-CM | POA: Diagnosis not present

## 2022-11-09 DIAGNOSIS — J449 Chronic obstructive pulmonary disease, unspecified: Secondary | ICD-10-CM | POA: Diagnosis not present

## 2022-11-09 DIAGNOSIS — R269 Unspecified abnormalities of gait and mobility: Secondary | ICD-10-CM | POA: Diagnosis not present

## 2022-11-09 DIAGNOSIS — E46 Unspecified protein-calorie malnutrition: Secondary | ICD-10-CM | POA: Diagnosis not present

## 2022-11-09 DIAGNOSIS — J90 Pleural effusion, not elsewhere classified: Secondary | ICD-10-CM | POA: Diagnosis not present

## 2022-11-09 DIAGNOSIS — S7291XD Unspecified fracture of right femur, subsequent encounter for closed fracture with routine healing: Secondary | ICD-10-CM | POA: Diagnosis not present

## 2022-11-09 DIAGNOSIS — M810 Age-related osteoporosis without current pathological fracture: Secondary | ICD-10-CM | POA: Diagnosis not present

## 2022-11-09 DIAGNOSIS — Z681 Body mass index (BMI) 19 or less, adult: Secondary | ICD-10-CM | POA: Diagnosis not present

## 2022-11-09 DIAGNOSIS — M6250 Muscle wasting and atrophy, not elsewhere classified, unspecified site: Secondary | ICD-10-CM | POA: Diagnosis not present

## 2022-11-09 DIAGNOSIS — I150 Renovascular hypertension: Secondary | ICD-10-CM | POA: Diagnosis not present

## 2022-11-09 DIAGNOSIS — M6281 Muscle weakness (generalized): Secondary | ICD-10-CM | POA: Diagnosis not present

## 2022-11-09 DIAGNOSIS — R2681 Unsteadiness on feet: Secondary | ICD-10-CM | POA: Diagnosis not present

## 2022-11-09 DIAGNOSIS — S32509D Unspecified fracture of unspecified pubis, subsequent encounter for fracture with routine healing: Secondary | ICD-10-CM | POA: Diagnosis not present

## 2022-11-09 DIAGNOSIS — K567 Ileus, unspecified: Secondary | ICD-10-CM | POA: Diagnosis not present

## 2022-11-09 DIAGNOSIS — F17200 Nicotine dependence, unspecified, uncomplicated: Secondary | ICD-10-CM | POA: Diagnosis not present

## 2022-11-11 ENCOUNTER — Telehealth: Payer: Self-pay | Admitting: Orthopedic Surgery

## 2022-11-11 NOTE — Telephone Encounter (Signed)
Scheduled for 11/22/22 @ 315

## 2022-11-11 NOTE — Telephone Encounter (Signed)
Patient is in [place] for rehab and need a follow up appt from hip surgery. I dont have anything till feb. Please call and schedule follow up..[phone]([facility])

## 2022-11-12 ENCOUNTER — Other Ambulatory Visit: Payer: Self-pay | Admitting: *Deleted

## 2022-11-12 NOTE — Patient Outreach (Addendum)
Late entry for 11/11/22 Brenda Gibson resides in [place] skilled nursing facility. Screening for potential [facility] care coordination services as benefit of insurance plan.  Facility site visit to [employer]. Collaboration with Education officer, museum. Initial care plan meeting scheduled for 11/10/22.  Will continue to follow.    Marthenia Rolling, MSN, RN,BSN [place] Acute Care Coordinator [phone] (Direct dial)

## 2022-11-15 DIAGNOSIS — F17200 Nicotine dependence, unspecified, uncomplicated: Secondary | ICD-10-CM | POA: Diagnosis not present

## 2022-11-15 DIAGNOSIS — M6281 Muscle weakness (generalized): Secondary | ICD-10-CM | POA: Diagnosis not present

## 2022-11-15 DIAGNOSIS — M6250 Muscle wasting and atrophy, not elsewhere classified, unspecified site: Secondary | ICD-10-CM | POA: Diagnosis not present

## 2022-11-15 DIAGNOSIS — R2681 Unsteadiness on feet: Secondary | ICD-10-CM | POA: Diagnosis not present

## 2022-11-15 DIAGNOSIS — R41841 Cognitive communication deficit: Secondary | ICD-10-CM | POA: Diagnosis not present

## 2022-11-17 DIAGNOSIS — M6250 Muscle wasting and atrophy, not elsewhere classified, unspecified site: Secondary | ICD-10-CM | POA: Diagnosis not present

## 2022-11-17 DIAGNOSIS — R41841 Cognitive communication deficit: Secondary | ICD-10-CM | POA: Diagnosis not present

## 2022-11-17 DIAGNOSIS — R2681 Unsteadiness on feet: Secondary | ICD-10-CM | POA: Diagnosis not present

## 2022-11-17 DIAGNOSIS — F17200 Nicotine dependence, unspecified, uncomplicated: Secondary | ICD-10-CM | POA: Diagnosis not present

## 2022-11-17 DIAGNOSIS — M6281 Muscle weakness (generalized): Secondary | ICD-10-CM | POA: Diagnosis not present

## 2022-11-22 ENCOUNTER — Ambulatory Visit (INDEPENDENT_AMBULATORY_CARE_PROVIDER_SITE_OTHER): Payer: Medicare Other | Admitting: Orthopedic Surgery

## 2022-11-22 ENCOUNTER — Ambulatory Visit (INDEPENDENT_AMBULATORY_CARE_PROVIDER_SITE_OTHER): Payer: Medicare Other

## 2022-11-22 DIAGNOSIS — M6281 Muscle weakness (generalized): Secondary | ICD-10-CM | POA: Diagnosis not present

## 2022-11-22 DIAGNOSIS — R41841 Cognitive communication deficit: Secondary | ICD-10-CM | POA: Diagnosis not present

## 2022-11-22 DIAGNOSIS — M25551 Pain in right hip: Secondary | ICD-10-CM

## 2022-11-22 DIAGNOSIS — F17200 Nicotine dependence, unspecified, uncomplicated: Secondary | ICD-10-CM | POA: Diagnosis not present

## 2022-11-22 DIAGNOSIS — R2681 Unsteadiness on feet: Secondary | ICD-10-CM | POA: Diagnosis not present

## 2022-11-22 DIAGNOSIS — M6250 Muscle wasting and atrophy, not elsewhere classified, unspecified site: Secondary | ICD-10-CM | POA: Diagnosis not present

## 2022-11-22 NOTE — Progress Notes (Signed)
Orthopedic Surgery Progress Note     Assessment: Patient is a 82 y.o. female with right femoral neck fracture status post hip screw (~3 weeks post-op)     Plan: -Operative plans complete -Can leave incision open to air -Okay to shower and let soap/water run over the wound, but do not submerge the wound -DVT ppx: '81mg'$  BID x6 weeks -Weight bearing status: as tolerated -Pain control -Return to office in 3 weeks, x-rays at next visit: AP/frog leg lateral right hip   ___________________________________________________________________________   Subjective: Has been doing well since she was last seen in the hospital. Is at a SNF. Has been ambulating with a walker. Not having any hip pain. Denies paresthesias and numbness. Has not noticed any redness or drainage around the incision.      Physical Exam:   General: no acute distress, appears stated age Neurologic: alert, answering questions appropriately, following commands Respiratory: unlabored breathing on room air, symmetric chest rise   Was able to walk the clinic halls this afternoon unassisted without any ambulatory devices.   MSK:    -Right lower extremity             Incision appears well healed with no active or expressible drainage. There is no erythema or induration.  EHL/TA/GSC intact Plantarflexes and dorsiflexes toes Sensation intact to light touch in sural, saphenous, tibial, deep peroneal, and superficial peroneal nerve distributions Foot warm and well perfused     Patient name: Brenda Gibson Patient MRN: [id number] Date: 11/22/22

## 2022-11-24 DIAGNOSIS — R2681 Unsteadiness on feet: Secondary | ICD-10-CM | POA: Diagnosis not present

## 2022-11-24 DIAGNOSIS — F32A Depression, unspecified: Secondary | ICD-10-CM | POA: Diagnosis not present

## 2022-11-24 DIAGNOSIS — F17200 Nicotine dependence, unspecified, uncomplicated: Secondary | ICD-10-CM | POA: Diagnosis not present

## 2022-11-24 DIAGNOSIS — M6250 Muscle wasting and atrophy, not elsewhere classified, unspecified site: Secondary | ICD-10-CM | POA: Diagnosis not present

## 2022-11-24 DIAGNOSIS — R41841 Cognitive communication deficit: Secondary | ICD-10-CM | POA: Diagnosis not present

## 2022-11-24 DIAGNOSIS — M6281 Muscle weakness (generalized): Secondary | ICD-10-CM | POA: Diagnosis not present

## 2022-11-29 DIAGNOSIS — F32A Depression, unspecified: Secondary | ICD-10-CM | POA: Diagnosis not present

## 2022-11-29 DIAGNOSIS — R2681 Unsteadiness on feet: Secondary | ICD-10-CM | POA: Diagnosis not present

## 2022-11-29 DIAGNOSIS — M6281 Muscle weakness (generalized): Secondary | ICD-10-CM | POA: Diagnosis not present

## 2022-11-29 DIAGNOSIS — F4320 Adjustment disorder, unspecified: Secondary | ICD-10-CM | POA: Diagnosis not present

## 2022-11-29 DIAGNOSIS — M6250 Muscle wasting and atrophy, not elsewhere classified, unspecified site: Secondary | ICD-10-CM | POA: Diagnosis not present

## 2022-11-29 DIAGNOSIS — R41841 Cognitive communication deficit: Secondary | ICD-10-CM | POA: Diagnosis not present

## 2022-12-01 DIAGNOSIS — M6281 Muscle weakness (generalized): Secondary | ICD-10-CM | POA: Diagnosis not present

## 2022-12-01 DIAGNOSIS — R41841 Cognitive communication deficit: Secondary | ICD-10-CM | POA: Diagnosis not present

## 2022-12-01 DIAGNOSIS — R2681 Unsteadiness on feet: Secondary | ICD-10-CM | POA: Diagnosis not present

## 2022-12-01 DIAGNOSIS — M6250 Muscle wasting and atrophy, not elsewhere classified, unspecified site: Secondary | ICD-10-CM | POA: Diagnosis not present

## 2022-12-01 DIAGNOSIS — F32A Depression, unspecified: Secondary | ICD-10-CM | POA: Diagnosis not present

## 2022-12-06 DIAGNOSIS — R41841 Cognitive communication deficit: Secondary | ICD-10-CM | POA: Diagnosis not present

## 2022-12-06 DIAGNOSIS — R2681 Unsteadiness on feet: Secondary | ICD-10-CM | POA: Diagnosis not present

## 2022-12-06 DIAGNOSIS — F32A Depression, unspecified: Secondary | ICD-10-CM | POA: Diagnosis not present

## 2022-12-06 DIAGNOSIS — M6281 Muscle weakness (generalized): Secondary | ICD-10-CM | POA: Diagnosis not present

## 2022-12-06 DIAGNOSIS — M6250 Muscle wasting and atrophy, not elsewhere classified, unspecified site: Secondary | ICD-10-CM | POA: Diagnosis not present

## 2022-12-08 DIAGNOSIS — M6281 Muscle weakness (generalized): Secondary | ICD-10-CM | POA: Diagnosis not present

## 2022-12-08 DIAGNOSIS — F32A Depression, unspecified: Secondary | ICD-10-CM | POA: Diagnosis not present

## 2022-12-08 DIAGNOSIS — M6250 Muscle wasting and atrophy, not elsewhere classified, unspecified site: Secondary | ICD-10-CM | POA: Diagnosis not present

## 2022-12-08 DIAGNOSIS — R41841 Cognitive communication deficit: Secondary | ICD-10-CM | POA: Diagnosis not present

## 2022-12-08 DIAGNOSIS — R2681 Unsteadiness on feet: Secondary | ICD-10-CM | POA: Diagnosis not present

## 2022-12-09 ENCOUNTER — Other Ambulatory Visit: Payer: Self-pay | Admitting: *Deleted

## 2022-12-09 NOTE — Patient Outreach (Signed)
[facility] Post- Acute Care Coordinator follow up. Mrs. Locicero resides in [facility]. Screening for potential [facility] care coordination services as benefit of health plan and Primary Care Provider.  Collaboration with [place], Glass blower/designer. Mrs. Bozich is now under guardianship with DSS. Transition plan is long term care.   Marthenia Rolling, MSN, RN,BSN [place] Acute Care Coordinator [phone] (Direct dial)

## 2022-12-13 DIAGNOSIS — F32A Depression, unspecified: Secondary | ICD-10-CM | POA: Diagnosis not present

## 2022-12-13 DIAGNOSIS — M6250 Muscle wasting and atrophy, not elsewhere classified, unspecified site: Secondary | ICD-10-CM | POA: Diagnosis not present

## 2022-12-13 DIAGNOSIS — R41841 Cognitive communication deficit: Secondary | ICD-10-CM | POA: Diagnosis not present

## 2022-12-13 DIAGNOSIS — M6281 Muscle weakness (generalized): Secondary | ICD-10-CM | POA: Diagnosis not present

## 2022-12-13 DIAGNOSIS — R2681 Unsteadiness on feet: Secondary | ICD-10-CM | POA: Diagnosis not present

## 2022-12-15 ENCOUNTER — Ambulatory Visit (INDEPENDENT_AMBULATORY_CARE_PROVIDER_SITE_OTHER): Payer: Medicare Other | Admitting: Orthopedic Surgery

## 2022-12-15 ENCOUNTER — Ambulatory Visit (INDEPENDENT_AMBULATORY_CARE_PROVIDER_SITE_OTHER): Payer: Medicare Other

## 2022-12-15 ENCOUNTER — Encounter: Payer: Self-pay | Admitting: Orthopedic Surgery

## 2022-12-15 DIAGNOSIS — M25551 Pain in right hip: Secondary | ICD-10-CM

## 2022-12-15 DIAGNOSIS — R2681 Unsteadiness on feet: Secondary | ICD-10-CM | POA: Diagnosis not present

## 2022-12-15 DIAGNOSIS — F32A Depression, unspecified: Secondary | ICD-10-CM | POA: Diagnosis not present

## 2022-12-15 DIAGNOSIS — M6281 Muscle weakness (generalized): Secondary | ICD-10-CM | POA: Diagnosis not present

## 2022-12-15 DIAGNOSIS — R41841 Cognitive communication deficit: Secondary | ICD-10-CM | POA: Diagnosis not present

## 2022-12-15 DIAGNOSIS — M6250 Muscle wasting and atrophy, not elsewhere classified, unspecified site: Secondary | ICD-10-CM | POA: Diagnosis not present

## 2022-12-15 NOTE — Progress Notes (Signed)
Orthopedic Surgery Progress Note     Assessment: Patient is a 82 y.o. female with right femoral neck fracture status post hip screw (~6 weeks post-op)     Plan: -Operative plans complete -Okay to shower and submerge the wound at this time -Weight bearing status: as tolerated -Activity as tolerated -Pain control -Return to office in 6 weeks, x-rays at next visit: AP/frog leg lateral right hip   ___________________________________________________________________________   Subjective: Has been doing well since she was last seen. Is still walking with a walker. Not having any hip pain. Not using any pain mediations. Denies paresthesias and numbness. Has not noticed any redness or drainage around the incision.      Physical Exam:   General: no acute distress, appears stated age Neurologic: alert, answering questions appropriately, following commands Respiratory: unlabored breathing on room air, symmetric chest rise    MSK:    -Right lower extremity             Incision appears well healed with no evidence of infection EHL/TA/GSC intact Plantarflexes and dorsiflexes toes Sensation intact to light touch in sural, saphenous, tibial, deep peroneal, and superficial peroneal nerve distributions Foot warm and well perfused     Patient name: Brenda Gibson Patient MRN: [id number] Date: 12/15/2022

## 2022-12-16 DIAGNOSIS — D649 Anemia, unspecified: Secondary | ICD-10-CM | POA: Diagnosis not present

## 2022-12-16 DIAGNOSIS — Z681 Body mass index (BMI) 19 or less, adult: Secondary | ICD-10-CM | POA: Diagnosis not present

## 2022-12-16 DIAGNOSIS — K567 Ileus, unspecified: Secondary | ICD-10-CM | POA: Diagnosis not present

## 2022-12-16 DIAGNOSIS — I150 Renovascular hypertension: Secondary | ICD-10-CM | POA: Diagnosis not present

## 2022-12-16 DIAGNOSIS — Z7189 Other specified counseling: Secondary | ICD-10-CM | POA: Diagnosis not present

## 2022-12-16 DIAGNOSIS — J449 Chronic obstructive pulmonary disease, unspecified: Secondary | ICD-10-CM | POA: Diagnosis not present

## 2022-12-16 DIAGNOSIS — F259 Schizoaffective disorder, unspecified: Secondary | ICD-10-CM | POA: Diagnosis not present

## 2022-12-16 DIAGNOSIS — S7291XA Unspecified fracture of right femur, initial encounter for closed fracture: Secondary | ICD-10-CM | POA: Diagnosis not present

## 2022-12-20 DIAGNOSIS — M6281 Muscle weakness (generalized): Secondary | ICD-10-CM | POA: Diagnosis not present

## 2022-12-20 DIAGNOSIS — F32A Depression, unspecified: Secondary | ICD-10-CM | POA: Diagnosis not present

## 2022-12-20 DIAGNOSIS — R2681 Unsteadiness on feet: Secondary | ICD-10-CM | POA: Diagnosis not present

## 2022-12-20 DIAGNOSIS — M6250 Muscle wasting and atrophy, not elsewhere classified, unspecified site: Secondary | ICD-10-CM | POA: Diagnosis not present

## 2022-12-20 DIAGNOSIS — R41841 Cognitive communication deficit: Secondary | ICD-10-CM | POA: Diagnosis not present

## 2022-12-22 DIAGNOSIS — R41841 Cognitive communication deficit: Secondary | ICD-10-CM | POA: Diagnosis not present

## 2022-12-22 DIAGNOSIS — M6281 Muscle weakness (generalized): Secondary | ICD-10-CM | POA: Diagnosis not present

## 2022-12-22 DIAGNOSIS — M6250 Muscle wasting and atrophy, not elsewhere classified, unspecified site: Secondary | ICD-10-CM | POA: Diagnosis not present

## 2022-12-22 DIAGNOSIS — F32A Depression, unspecified: Secondary | ICD-10-CM | POA: Diagnosis not present

## 2022-12-22 DIAGNOSIS — R2681 Unsteadiness on feet: Secondary | ICD-10-CM | POA: Diagnosis not present

## 2022-12-27 DIAGNOSIS — F32A Depression, unspecified: Secondary | ICD-10-CM | POA: Diagnosis not present

## 2022-12-27 DIAGNOSIS — M6281 Muscle weakness (generalized): Secondary | ICD-10-CM | POA: Diagnosis not present

## 2022-12-27 DIAGNOSIS — R2681 Unsteadiness on feet: Secondary | ICD-10-CM | POA: Diagnosis not present

## 2022-12-27 DIAGNOSIS — M6250 Muscle wasting and atrophy, not elsewhere classified, unspecified site: Secondary | ICD-10-CM | POA: Diagnosis not present

## 2022-12-27 DIAGNOSIS — R41841 Cognitive communication deficit: Secondary | ICD-10-CM | POA: Diagnosis not present

## 2022-12-29 DIAGNOSIS — R41841 Cognitive communication deficit: Secondary | ICD-10-CM | POA: Diagnosis not present

## 2022-12-29 DIAGNOSIS — F32A Depression, unspecified: Secondary | ICD-10-CM | POA: Diagnosis not present

## 2022-12-29 DIAGNOSIS — M6250 Muscle wasting and atrophy, not elsewhere classified, unspecified site: Secondary | ICD-10-CM | POA: Diagnosis not present

## 2022-12-29 DIAGNOSIS — R2681 Unsteadiness on feet: Secondary | ICD-10-CM | POA: Diagnosis not present

## 2022-12-29 DIAGNOSIS — M6281 Muscle weakness (generalized): Secondary | ICD-10-CM | POA: Diagnosis not present

## 2022-12-30 DIAGNOSIS — Z681 Body mass index (BMI) 19 or less, adult: Secondary | ICD-10-CM | POA: Diagnosis not present

## 2022-12-30 DIAGNOSIS — S72044D Nondisplaced fracture of base of neck of right femur, subsequent encounter for closed fracture with routine healing: Secondary | ICD-10-CM | POA: Diagnosis not present

## 2022-12-30 DIAGNOSIS — K5900 Constipation, unspecified: Secondary | ICD-10-CM | POA: Diagnosis not present

## 2022-12-30 DIAGNOSIS — M810 Age-related osteoporosis without current pathological fracture: Secondary | ICD-10-CM | POA: Diagnosis not present

## 2022-12-30 DIAGNOSIS — I150 Renovascular hypertension: Secondary | ICD-10-CM | POA: Diagnosis not present

## 2022-12-30 DIAGNOSIS — R4589 Other symptoms and signs involving emotional state: Secondary | ICD-10-CM | POA: Diagnosis not present

## 2022-12-30 DIAGNOSIS — E46 Unspecified protein-calorie malnutrition: Secondary | ICD-10-CM | POA: Diagnosis not present

## 2022-12-30 DIAGNOSIS — E43 Unspecified severe protein-calorie malnutrition: Secondary | ICD-10-CM | POA: Diagnosis not present

## 2022-12-30 DIAGNOSIS — E639 Nutritional deficiency, unspecified: Secondary | ICD-10-CM | POA: Diagnosis not present

## 2022-12-30 DIAGNOSIS — S32509D Unspecified fracture of unspecified pubis, subsequent encounter for fracture with routine healing: Secondary | ICD-10-CM | POA: Diagnosis not present

## 2022-12-30 DIAGNOSIS — J449 Chronic obstructive pulmonary disease, unspecified: Secondary | ICD-10-CM | POA: Diagnosis not present

## 2022-12-30 DIAGNOSIS — R269 Unspecified abnormalities of gait and mobility: Secondary | ICD-10-CM | POA: Diagnosis not present

## 2023-01-03 DIAGNOSIS — M6250 Muscle wasting and atrophy, not elsewhere classified, unspecified site: Secondary | ICD-10-CM | POA: Diagnosis not present

## 2023-01-03 DIAGNOSIS — F32A Depression, unspecified: Secondary | ICD-10-CM | POA: Diagnosis not present

## 2023-01-03 DIAGNOSIS — R2681 Unsteadiness on feet: Secondary | ICD-10-CM | POA: Diagnosis not present

## 2023-01-03 DIAGNOSIS — F4320 Adjustment disorder, unspecified: Secondary | ICD-10-CM | POA: Diagnosis not present

## 2023-01-03 DIAGNOSIS — R41841 Cognitive communication deficit: Secondary | ICD-10-CM | POA: Diagnosis not present

## 2023-01-03 DIAGNOSIS — M6281 Muscle weakness (generalized): Secondary | ICD-10-CM | POA: Diagnosis not present

## 2023-01-04 DIAGNOSIS — E639 Nutritional deficiency, unspecified: Secondary | ICD-10-CM | POA: Diagnosis not present

## 2023-01-04 DIAGNOSIS — F259 Schizoaffective disorder, unspecified: Secondary | ICD-10-CM | POA: Diagnosis not present

## 2023-01-04 DIAGNOSIS — Z681 Body mass index (BMI) 19 or less, adult: Secondary | ICD-10-CM | POA: Diagnosis not present

## 2023-01-04 DIAGNOSIS — E46 Unspecified protein-calorie malnutrition: Secondary | ICD-10-CM | POA: Diagnosis not present

## 2023-01-04 DIAGNOSIS — F419 Anxiety disorder, unspecified: Secondary | ICD-10-CM | POA: Diagnosis not present

## 2023-01-05 DIAGNOSIS — M6281 Muscle weakness (generalized): Secondary | ICD-10-CM | POA: Diagnosis not present

## 2023-01-05 DIAGNOSIS — R2681 Unsteadiness on feet: Secondary | ICD-10-CM | POA: Diagnosis not present

## 2023-01-05 DIAGNOSIS — M6250 Muscle wasting and atrophy, not elsewhere classified, unspecified site: Secondary | ICD-10-CM | POA: Diagnosis not present

## 2023-01-05 DIAGNOSIS — F32A Depression, unspecified: Secondary | ICD-10-CM | POA: Diagnosis not present

## 2023-01-05 DIAGNOSIS — R41841 Cognitive communication deficit: Secondary | ICD-10-CM | POA: Diagnosis not present

## 2023-01-10 DIAGNOSIS — R41841 Cognitive communication deficit: Secondary | ICD-10-CM | POA: Diagnosis not present

## 2023-01-10 DIAGNOSIS — F32A Depression, unspecified: Secondary | ICD-10-CM | POA: Diagnosis not present

## 2023-01-10 DIAGNOSIS — R2681 Unsteadiness on feet: Secondary | ICD-10-CM | POA: Diagnosis not present

## 2023-01-10 DIAGNOSIS — M6250 Muscle wasting and atrophy, not elsewhere classified, unspecified site: Secondary | ICD-10-CM | POA: Diagnosis not present

## 2023-01-10 DIAGNOSIS — M6281 Muscle weakness (generalized): Secondary | ICD-10-CM | POA: Diagnosis not present

## 2023-01-12 DIAGNOSIS — M6281 Muscle weakness (generalized): Secondary | ICD-10-CM | POA: Diagnosis not present

## 2023-01-12 DIAGNOSIS — R41841 Cognitive communication deficit: Secondary | ICD-10-CM | POA: Diagnosis not present

## 2023-01-12 DIAGNOSIS — R2681 Unsteadiness on feet: Secondary | ICD-10-CM | POA: Diagnosis not present

## 2023-01-12 DIAGNOSIS — M6250 Muscle wasting and atrophy, not elsewhere classified, unspecified site: Secondary | ICD-10-CM | POA: Diagnosis not present

## 2023-01-12 DIAGNOSIS — F32A Depression, unspecified: Secondary | ICD-10-CM | POA: Diagnosis not present

## 2023-01-17 DIAGNOSIS — M6281 Muscle weakness (generalized): Secondary | ICD-10-CM | POA: Diagnosis not present

## 2023-01-17 DIAGNOSIS — F32A Depression, unspecified: Secondary | ICD-10-CM | POA: Diagnosis not present

## 2023-01-17 DIAGNOSIS — R41841 Cognitive communication deficit: Secondary | ICD-10-CM | POA: Diagnosis not present

## 2023-01-17 DIAGNOSIS — R2681 Unsteadiness on feet: Secondary | ICD-10-CM | POA: Diagnosis not present

## 2023-01-17 DIAGNOSIS — M6250 Muscle wasting and atrophy, not elsewhere classified, unspecified site: Secondary | ICD-10-CM | POA: Diagnosis not present

## 2023-01-20 ENCOUNTER — Other Ambulatory Visit: Payer: Self-pay | Admitting: *Deleted

## 2023-01-20 NOTE — Patient Outreach (Signed)
Brenda Gibson Coordinator follow up. Brenda Gibson resides in [facility].   Confirmed with Kirstin, [facility] social worker, Brenda Gibson transitioned to long term care on 01/18/23.  No identifiable [facility] care coordination needs.   Marthenia Rolling, MSN, RN,BSN [facility] Coordinator [phone] (Direct dial)

## 2023-01-26 ENCOUNTER — Ambulatory Visit (INDEPENDENT_AMBULATORY_CARE_PROVIDER_SITE_OTHER): Payer: Medicare Other | Admitting: Orthopedic Surgery

## 2023-01-26 ENCOUNTER — Other Ambulatory Visit (INDEPENDENT_AMBULATORY_CARE_PROVIDER_SITE_OTHER): Payer: Medicare Other

## 2023-01-26 DIAGNOSIS — M6281 Muscle weakness (generalized): Secondary | ICD-10-CM | POA: Diagnosis not present

## 2023-01-26 DIAGNOSIS — M25551 Pain in right hip: Secondary | ICD-10-CM

## 2023-01-26 NOTE — Progress Notes (Signed)
Orthopedic Surgery Progress Note     Assessment: Patient is a 82 y.o. female with right femoral neck fracture status post hip screw Date of surgery: 10/27/2022 (~3 months post-op)     Plan: -Operative plans complete -Okay to shower and submerge -Weight bearing status: as tolerated -Activity as tolerated -Pain control -Return to office in 12 weeks, x-rays at next visit: AP/frog leg lateral right hip   ___________________________________________________________________________   Subjective: Not having any pain in her hip.  Is ambulating with a walker.  No numbness or paresthesias.  Has not noticed any redness or drainage around the incision.  Is pleased with her outcome so far.     Physical Exam:   General: no acute distress, appears stated age Neurologic: alert, answering questions appropriately, following commands Respiratory: unlabored breathing on room air, symmetric chest rise    MSK:    -Right lower extremity             Incision appears well healed with no evidence of infection EHL/TA/GSC intact Plantarflexes and dorsiflexes toes Sensation intact to light touch in sural, saphenous, tibial, deep peroneal, and superficial peroneal nerve distributions Foot warm and well perfused   XR of the right hip from 01/26/2023 was independently reviewed and interpreted, showing dynamic hip screw in the right hip.  No lucency around the implant.  Screw has not cut out.  Screws still well centered within the femoral head.  Fracture alignment maintained.  No new fracture seen.   Patient name: Brenda Gibson Patient MRN: [id number] Date: 01/26/23

## 2023-01-27 DIAGNOSIS — M6281 Muscle weakness (generalized): Secondary | ICD-10-CM | POA: Diagnosis not present

## 2023-01-28 DIAGNOSIS — M6281 Muscle weakness (generalized): Secondary | ICD-10-CM | POA: Diagnosis not present

## 2023-01-31 DIAGNOSIS — M6281 Muscle weakness (generalized): Secondary | ICD-10-CM | POA: Diagnosis not present

## 2023-02-01 DIAGNOSIS — M6281 Muscle weakness (generalized): Secondary | ICD-10-CM | POA: Diagnosis not present

## 2023-02-23 DIAGNOSIS — F17210 Nicotine dependence, cigarettes, uncomplicated: Secondary | ICD-10-CM | POA: Diagnosis not present

## 2023-02-23 DIAGNOSIS — F32A Depression, unspecified: Secondary | ICD-10-CM | POA: Diagnosis not present

## 2023-02-23 DIAGNOSIS — T7491XA Unspecified adult maltreatment, confirmed, initial encounter: Secondary | ICD-10-CM | POA: Diagnosis not present

## 2023-02-23 DIAGNOSIS — F259 Schizoaffective disorder, unspecified: Secondary | ICD-10-CM | POA: Diagnosis not present

## 2023-02-23 DIAGNOSIS — E46 Unspecified protein-calorie malnutrition: Secondary | ICD-10-CM | POA: Diagnosis not present

## 2023-02-23 DIAGNOSIS — J449 Chronic obstructive pulmonary disease, unspecified: Secondary | ICD-10-CM | POA: Diagnosis not present

## 2023-02-23 DIAGNOSIS — I10 Essential (primary) hypertension: Secondary | ICD-10-CM | POA: Diagnosis not present

## 2023-02-23 DIAGNOSIS — S7291XD Unspecified fracture of right femur, subsequent encounter for closed fracture with routine healing: Secondary | ICD-10-CM | POA: Diagnosis not present

## 2023-03-14 DIAGNOSIS — F411 Generalized anxiety disorder: Secondary | ICD-10-CM | POA: Diagnosis not present

## 2023-03-14 DIAGNOSIS — F17200 Nicotine dependence, unspecified, uncomplicated: Secondary | ICD-10-CM | POA: Diagnosis not present

## 2023-03-14 DIAGNOSIS — F32A Depression, unspecified: Secondary | ICD-10-CM | POA: Diagnosis not present

## 2023-03-18 DIAGNOSIS — S32509D Unspecified fracture of unspecified pubis, subsequent encounter for fracture with routine healing: Secondary | ICD-10-CM | POA: Diagnosis not present

## 2023-03-18 DIAGNOSIS — S7291XD Unspecified fracture of right femur, subsequent encounter for closed fracture with routine healing: Secondary | ICD-10-CM | POA: Diagnosis not present

## 2023-03-18 DIAGNOSIS — I10 Essential (primary) hypertension: Secondary | ICD-10-CM | POA: Diagnosis not present

## 2023-03-18 DIAGNOSIS — J449 Chronic obstructive pulmonary disease, unspecified: Secondary | ICD-10-CM | POA: Diagnosis not present

## 2023-03-18 DIAGNOSIS — J90 Pleural effusion, not elsewhere classified: Secondary | ICD-10-CM | POA: Diagnosis not present

## 2023-03-30 DIAGNOSIS — R1310 Dysphagia, unspecified: Secondary | ICD-10-CM | POA: Diagnosis not present

## 2023-03-30 DIAGNOSIS — E639 Nutritional deficiency, unspecified: Secondary | ICD-10-CM | POA: Diagnosis not present

## 2023-03-30 DIAGNOSIS — E43 Unspecified severe protein-calorie malnutrition: Secondary | ICD-10-CM | POA: Diagnosis not present

## 2023-03-30 DIAGNOSIS — R4589 Other symptoms and signs involving emotional state: Secondary | ICD-10-CM | POA: Diagnosis not present

## 2023-03-30 DIAGNOSIS — M810 Age-related osteoporosis without current pathological fracture: Secondary | ICD-10-CM | POA: Diagnosis not present

## 2023-03-30 DIAGNOSIS — R269 Unspecified abnormalities of gait and mobility: Secondary | ICD-10-CM | POA: Diagnosis not present

## 2023-03-30 DIAGNOSIS — J449 Chronic obstructive pulmonary disease, unspecified: Secondary | ICD-10-CM | POA: Diagnosis not present

## 2023-03-30 DIAGNOSIS — Z8781 Personal history of (healed) traumatic fracture: Secondary | ICD-10-CM | POA: Diagnosis not present

## 2023-03-30 DIAGNOSIS — F39 Unspecified mood [affective] disorder: Secondary | ICD-10-CM | POA: Diagnosis not present

## 2023-03-30 DIAGNOSIS — L219 Seborrheic dermatitis, unspecified: Secondary | ICD-10-CM | POA: Diagnosis not present

## 2023-03-30 DIAGNOSIS — I150 Renovascular hypertension: Secondary | ICD-10-CM | POA: Diagnosis not present

## 2023-03-30 DIAGNOSIS — Z9181 History of falling: Secondary | ICD-10-CM | POA: Diagnosis not present

## 2023-04-01 DIAGNOSIS — R1311 Dysphagia, oral phase: Secondary | ICD-10-CM | POA: Diagnosis not present

## 2023-04-01 DIAGNOSIS — F259 Schizoaffective disorder, unspecified: Secondary | ICD-10-CM | POA: Diagnosis not present

## 2023-04-05 DIAGNOSIS — M6281 Muscle weakness (generalized): Secondary | ICD-10-CM | POA: Diagnosis not present

## 2023-04-05 DIAGNOSIS — R1311 Dysphagia, oral phase: Secondary | ICD-10-CM | POA: Diagnosis not present

## 2023-04-07 DIAGNOSIS — R1311 Dysphagia, oral phase: Secondary | ICD-10-CM | POA: Diagnosis not present

## 2023-04-07 DIAGNOSIS — M6281 Muscle weakness (generalized): Secondary | ICD-10-CM | POA: Diagnosis not present

## 2023-04-08 DIAGNOSIS — M6281 Muscle weakness (generalized): Secondary | ICD-10-CM | POA: Diagnosis not present

## 2023-04-08 DIAGNOSIS — R1311 Dysphagia, oral phase: Secondary | ICD-10-CM | POA: Diagnosis not present

## 2023-04-14 DIAGNOSIS — R419 Unspecified symptoms and signs involving cognitive functions and awareness: Secondary | ICD-10-CM | POA: Diagnosis not present

## 2023-04-25 DIAGNOSIS — R1311 Dysphagia, oral phase: Secondary | ICD-10-CM | POA: Diagnosis not present

## 2023-04-25 DIAGNOSIS — M6281 Muscle weakness (generalized): Secondary | ICD-10-CM | POA: Diagnosis not present

## 2023-04-26 DIAGNOSIS — M6281 Muscle weakness (generalized): Secondary | ICD-10-CM | POA: Diagnosis not present

## 2023-04-26 DIAGNOSIS — R1311 Dysphagia, oral phase: Secondary | ICD-10-CM | POA: Diagnosis not present

## 2023-04-27 DIAGNOSIS — R1311 Dysphagia, oral phase: Secondary | ICD-10-CM | POA: Diagnosis not present

## 2023-04-27 DIAGNOSIS — M6281 Muscle weakness (generalized): Secondary | ICD-10-CM | POA: Diagnosis not present

## 2023-04-28 DIAGNOSIS — M6281 Muscle weakness (generalized): Secondary | ICD-10-CM | POA: Diagnosis not present

## 2023-04-28 DIAGNOSIS — R1311 Dysphagia, oral phase: Secondary | ICD-10-CM | POA: Diagnosis not present

## 2023-04-29 DIAGNOSIS — M6281 Muscle weakness (generalized): Secondary | ICD-10-CM | POA: Diagnosis not present

## 2023-04-29 DIAGNOSIS — R1311 Dysphagia, oral phase: Secondary | ICD-10-CM | POA: Diagnosis not present

## 2023-05-02 ENCOUNTER — Ambulatory Visit (INDEPENDENT_AMBULATORY_CARE_PROVIDER_SITE_OTHER): Payer: Medicare Other | Admitting: Orthopedic Surgery

## 2023-05-02 ENCOUNTER — Other Ambulatory Visit (INDEPENDENT_AMBULATORY_CARE_PROVIDER_SITE_OTHER): Payer: Medicare Other

## 2023-05-02 DIAGNOSIS — M25551 Pain in right hip: Secondary | ICD-10-CM

## 2023-05-02 DIAGNOSIS — M6281 Muscle weakness (generalized): Secondary | ICD-10-CM | POA: Diagnosis not present

## 2023-05-02 NOTE — Progress Notes (Signed)
Orthopedic Surgery Progress Note     Assessment: Patient is a 82 y.o. female with right femoral neck fracture status post hip screw Date of surgery: 10/27/2022 (~6 months post-op)     Plan: -Operative plans complete -No activity restrictions -OTC medications for pain control -Return to office on an as-needed basis   ___________________________________________________________________________   Subjective: Patient has been doing well since she is last seen.  She is not having any pain in her hip.  She does not have any pain with ambulation.  She has not noticed any redness or drainage from the incision.  She has no complaints today.     Physical Exam:   General: no acute distress, appears stated age Neurologic: alert, answering questions appropriately, following commands Respiratory: unlabored breathing on room air, symmetric chest rise    MSK:    -Right lower extremity             Incision appears well healed with no evidence of infection  No pain with logroll or Stinchfield EHL/TA/GSC intact Plantarflexes and dorsiflexes toes Sensation intact to light touch in sural, saphenous, tibial, deep peroneal, and superficial peroneal nerve distributions Foot warm and well perfused   XR of the right hip from 05/02/2023 was independently reviewed and interpreted, showing dynamic hip screw in the right hip.  No lucency around the implant.  Screw has not cut out.  Screw still well centered within the femoral head.  Fracture alignment maintained.  No new fracture seen.  Degenerative changes noted within the left hip.   Patient name: Brenda Gibson Patient MRN: [id number] Date: 05/02/23

## 2023-05-03 DIAGNOSIS — M6281 Muscle weakness (generalized): Secondary | ICD-10-CM | POA: Diagnosis not present

## 2023-05-04 DIAGNOSIS — M6281 Muscle weakness (generalized): Secondary | ICD-10-CM | POA: Diagnosis not present

## 2023-05-05 DIAGNOSIS — M6281 Muscle weakness (generalized): Secondary | ICD-10-CM | POA: Diagnosis not present

## 2023-05-06 DIAGNOSIS — M6281 Muscle weakness (generalized): Secondary | ICD-10-CM | POA: Diagnosis not present

## 2023-05-09 DIAGNOSIS — M6281 Muscle weakness (generalized): Secondary | ICD-10-CM | POA: Diagnosis not present

## 2023-05-10 DIAGNOSIS — M6281 Muscle weakness (generalized): Secondary | ICD-10-CM | POA: Diagnosis not present

## 2023-05-11 DIAGNOSIS — M6281 Muscle weakness (generalized): Secondary | ICD-10-CM | POA: Diagnosis not present

## 2023-05-13 DIAGNOSIS — M6281 Muscle weakness (generalized): Secondary | ICD-10-CM | POA: Diagnosis not present

## 2023-05-16 DIAGNOSIS — F411 Generalized anxiety disorder: Secondary | ICD-10-CM | POA: Diagnosis not present

## 2023-05-16 DIAGNOSIS — F32A Depression, unspecified: Secondary | ICD-10-CM | POA: Diagnosis not present

## 2023-05-16 DIAGNOSIS — M6281 Muscle weakness (generalized): Secondary | ICD-10-CM | POA: Diagnosis not present

## 2023-05-18 DIAGNOSIS — M6281 Muscle weakness (generalized): Secondary | ICD-10-CM | POA: Diagnosis not present

## 2023-05-20 DIAGNOSIS — M6281 Muscle weakness (generalized): Secondary | ICD-10-CM | POA: Diagnosis not present

## 2023-05-25 DIAGNOSIS — M6281 Muscle weakness (generalized): Secondary | ICD-10-CM | POA: Diagnosis not present

## 2023-05-26 DIAGNOSIS — F32A Depression, unspecified: Secondary | ICD-10-CM | POA: Diagnosis not present

## 2023-05-26 DIAGNOSIS — R635 Abnormal weight gain: Secondary | ICD-10-CM | POA: Diagnosis not present

## 2023-05-26 DIAGNOSIS — F259 Schizoaffective disorder, unspecified: Secondary | ICD-10-CM | POA: Diagnosis not present

## 2023-05-26 DIAGNOSIS — J449 Chronic obstructive pulmonary disease, unspecified: Secondary | ICD-10-CM | POA: Diagnosis not present

## 2023-05-26 DIAGNOSIS — L309 Dermatitis, unspecified: Secondary | ICD-10-CM | POA: Diagnosis not present

## 2023-05-26 DIAGNOSIS — I10 Essential (primary) hypertension: Secondary | ICD-10-CM | POA: Diagnosis not present

## 2023-05-26 DIAGNOSIS — R54 Age-related physical debility: Secondary | ICD-10-CM | POA: Diagnosis not present

## 2023-05-27 DIAGNOSIS — M6281 Muscle weakness (generalized): Secondary | ICD-10-CM | POA: Diagnosis not present

## 2023-05-29 DIAGNOSIS — M6281 Muscle weakness (generalized): Secondary | ICD-10-CM | POA: Diagnosis not present

## 2023-06-01 DIAGNOSIS — M6281 Muscle weakness (generalized): Secondary | ICD-10-CM | POA: Diagnosis not present

## 2023-06-02 DIAGNOSIS — M6281 Muscle weakness (generalized): Secondary | ICD-10-CM | POA: Diagnosis not present

## 2023-06-03 DIAGNOSIS — M6281 Muscle weakness (generalized): Secondary | ICD-10-CM | POA: Diagnosis not present

## 2023-06-06 DIAGNOSIS — M6281 Muscle weakness (generalized): Secondary | ICD-10-CM | POA: Diagnosis not present

## 2023-06-07 DIAGNOSIS — M6281 Muscle weakness (generalized): Secondary | ICD-10-CM | POA: Diagnosis not present

## 2023-06-08 DIAGNOSIS — M6281 Muscle weakness (generalized): Secondary | ICD-10-CM | POA: Diagnosis not present

## 2023-06-09 DIAGNOSIS — M6281 Muscle weakness (generalized): Secondary | ICD-10-CM | POA: Diagnosis not present

## 2023-06-10 DIAGNOSIS — M6281 Muscle weakness (generalized): Secondary | ICD-10-CM | POA: Diagnosis not present

## 2023-06-13 DIAGNOSIS — F32A Depression, unspecified: Secondary | ICD-10-CM | POA: Diagnosis not present

## 2023-06-13 DIAGNOSIS — F411 Generalized anxiety disorder: Secondary | ICD-10-CM | POA: Diagnosis not present

## 2023-06-14 DIAGNOSIS — J449 Chronic obstructive pulmonary disease, unspecified: Secondary | ICD-10-CM | POA: Diagnosis not present

## 2023-06-14 DIAGNOSIS — I10 Essential (primary) hypertension: Secondary | ICD-10-CM | POA: Diagnosis not present

## 2023-06-14 DIAGNOSIS — M6281 Muscle weakness (generalized): Secondary | ICD-10-CM | POA: Diagnosis not present

## 2023-06-14 DIAGNOSIS — F259 Schizoaffective disorder, unspecified: Secondary | ICD-10-CM | POA: Diagnosis not present

## 2023-06-14 DIAGNOSIS — E43 Unspecified severe protein-calorie malnutrition: Secondary | ICD-10-CM | POA: Diagnosis not present

## 2023-06-21 DIAGNOSIS — M6281 Muscle weakness (generalized): Secondary | ICD-10-CM | POA: Diagnosis not present

## 2023-06-22 DIAGNOSIS — M6281 Muscle weakness (generalized): Secondary | ICD-10-CM | POA: Diagnosis not present

## 2023-06-29 DIAGNOSIS — Z961 Presence of intraocular lens: Secondary | ICD-10-CM | POA: Diagnosis not present

## 2023-07-11 DIAGNOSIS — F32A Depression, unspecified: Secondary | ICD-10-CM | POA: Diagnosis not present

## 2023-07-11 DIAGNOSIS — F411 Generalized anxiety disorder: Secondary | ICD-10-CM | POA: Diagnosis not present

## 2023-07-17 ENCOUNTER — Emergency Department (HOSPITAL_COMMUNITY): Payer: Medicare Other

## 2023-07-17 ENCOUNTER — Encounter (HOSPITAL_COMMUNITY): Payer: Self-pay

## 2023-07-17 ENCOUNTER — Other Ambulatory Visit (HOSPITAL_COMMUNITY): Payer: Self-pay

## 2023-07-17 ENCOUNTER — Other Ambulatory Visit: Payer: Self-pay

## 2023-07-17 ENCOUNTER — Emergency Department (HOSPITAL_COMMUNITY)
Admission: EM | Admit: 2023-07-17 | Discharge: 2023-07-17 | Disposition: A | Discharge status: Skilled Nursing Facility | Payer: Medicare Other | Attending: Emergency Medicine | Admitting: Emergency Medicine

## 2023-07-17 DIAGNOSIS — K5900 Constipation, unspecified: Secondary | ICD-10-CM | POA: Diagnosis not present

## 2023-07-17 DIAGNOSIS — Z79899 Other long term (current) drug therapy: Secondary | ICD-10-CM | POA: Diagnosis not present

## 2023-07-17 DIAGNOSIS — F0390 Unspecified dementia without behavioral disturbance: Secondary | ICD-10-CM | POA: Diagnosis not present

## 2023-07-17 DIAGNOSIS — J449 Chronic obstructive pulmonary disease, unspecified: Secondary | ICD-10-CM | POA: Diagnosis not present

## 2023-07-17 DIAGNOSIS — F17200 Nicotine dependence, unspecified, uncomplicated: Secondary | ICD-10-CM | POA: Insufficient documentation

## 2023-07-17 DIAGNOSIS — R1084 Generalized abdominal pain: Secondary | ICD-10-CM | POA: Diagnosis not present

## 2023-07-17 DIAGNOSIS — R1030 Lower abdominal pain, unspecified: Secondary | ICD-10-CM | POA: Diagnosis present

## 2023-07-17 DIAGNOSIS — R1033 Periumbilical pain: Secondary | ICD-10-CM | POA: Diagnosis not present

## 2023-07-17 DIAGNOSIS — I10 Essential (primary) hypertension: Secondary | ICD-10-CM | POA: Insufficient documentation

## 2023-07-17 LAB — CBC WITH DIFFERENTIAL/PLATELET
Abs Immature Granulocytes: 0.04 10*3/uL (ref 0.00–0.07)
Basophils Absolute: 0.1 10*3/uL (ref 0.0–0.1)
Basophils Relative: 1 %
Eosinophils Absolute: 0 10*3/uL (ref 0.0–0.5)
Eosinophils Relative: 0 %
HCT: 39.3 % (ref 36.0–46.0)
Hemoglobin: 11.2 g/dL — ABNORMAL LOW (ref 12.0–15.0)
Immature Granulocytes: 0 %
Lymphocytes Relative: 14 %
Lymphs Abs: 1.5 10*3/uL (ref 0.7–4.0)
MCH: 20.6 pg — ABNORMAL LOW (ref 26.0–34.0)
MCHC: 28.5 g/dL — ABNORMAL LOW (ref 30.0–36.0)
MCV: 72.4 fL — ABNORMAL LOW (ref 80.0–100.0)
Monocytes Absolute: 0.5 10*3/uL (ref 0.1–1.0)
Monocytes Relative: 4 %
Neutro Abs: 8.8 10*3/uL — ABNORMAL HIGH (ref 1.7–7.7)
Neutrophils Relative %: 81 %
Platelets: 338 10*3/uL (ref 150–400)
RBC: 5.43 MIL/uL — ABNORMAL HIGH (ref 3.87–5.11)
RDW: 18 % — ABNORMAL HIGH (ref 11.5–15.5)
WBC: 10.8 10*3/uL — ABNORMAL HIGH (ref 4.0–10.5)
nRBC: 0 % (ref 0.0–0.2)

## 2023-07-17 LAB — COMPREHENSIVE METABOLIC PANEL
ALT: 16 U/L (ref 0–44)
AST: 20 U/L (ref 15–41)
Albumin: 4.1 g/dL (ref 3.5–5.0)
Alkaline Phosphatase: 70 U/L (ref 38–126)
Anion gap: 9 (ref 5–15)
BUN: 30 mg/dL — ABNORMAL HIGH (ref 8–23)
CO2: 31 mmol/L (ref 22–32)
Calcium: 9.9 mg/dL (ref 8.9–10.3)
Chloride: 96 mmol/L — ABNORMAL LOW (ref 98–111)
Creatinine, Ser: 0.62 mg/dL (ref 0.44–1.00)
GFR, Estimated: >60 mL/min (ref >60)
Glucose, Bld: 119 mg/dL — ABNORMAL HIGH (ref 70–99)
Potassium: 4.3 mmol/L (ref 3.5–5.1)
Sodium: 136 mmol/L (ref 135–145)
Total Bilirubin: 0.4 mg/dL (ref 0.3–1.2)
Total Protein: 7.7 g/dL (ref 6.5–8.1)

## 2023-07-17 LAB — URINALYSIS, ROUTINE W REFLEX MICROSCOPIC
Bilirubin Urine: NEGATIVE
Glucose, UA: NEGATIVE mg/dL
Hgb urine dipstick: NEGATIVE
Ketones, ur: NEGATIVE mg/dL
Leukocytes,Ua: NEGATIVE
Nitrite: NEGATIVE
Protein, ur: NEGATIVE mg/dL
Specific Gravity, Urine: 1.018 (ref 1.005–1.030)
pH: 5 (ref 5.0–8.0)

## 2023-07-17 LAB — LACTIC ACID, PLASMA: Lactic Acid, Venous: 1 mmol/L (ref 0.5–1.9)

## 2023-07-17 LAB — LIPASE, BLOOD: Lipase: 33 U/L (ref 11–51)

## 2023-07-17 MED ORDER — POLYETHYLENE GLYCOL 3350 17 GM/SCOOP PO POWD
17.0000 g | Freq: Every day | ORAL | 0 refills | Status: AC
  Filled 2023-07-17: qty 238, 14d supply, fill #0

## 2023-07-17 MED ORDER — ONDANSETRON HCL 4 MG/2ML IJ SOLN
4.0000 mg | Freq: Once | INTRAMUSCULAR | Status: AC
  Administered 2023-07-17: 4 mg via INTRAVENOUS
  Filled 2023-07-17: qty 2

## 2023-07-17 MED ORDER — FENTANYL CITRATE PF 50 MCG/ML IJ SOSY
50.0000 ug | PREFILLED_SYRINGE | Freq: Once | INTRAMUSCULAR | Status: AC
  Administered 2023-07-17: 50 ug via INTRAVENOUS
  Filled 2023-07-17: qty 1

## 2023-07-17 MED ORDER — LACTATED RINGERS IV BOLUS
1000.0000 mL | Freq: Once | INTRAVENOUS | Status: AC
  Administered 2023-07-17: 1000 mL via INTRAVENOUS

## 2023-07-17 MED ORDER — IOHEXOL 300 MG/ML  SOLN
100.0000 mL | Freq: Once | INTRAMUSCULAR | Status: AC | PRN
  Administered 2023-07-17: 100 mL via INTRAVENOUS

## 2023-07-17 NOTE — Progress Notes (Signed)
TOC CSW received a call from the Charge Nurse, the pt is from [facility]. pt was discharged to the lobby to wait for transportation to her facility.  It was reported pt was discharged to the lobby for pt's son to get transportation back to her LTC facility. The facility was contacted pt's son has an open APS case against him. Per charge Nurse Leonette Most at [facility], DSS has legal guardianship over the pt. CSW requesting updated contact information from the facility. Per Leonette Most, the facility SW must send information to CSW tomorrow, CSW provided an email address. Per the facility, pt has dementia and uses a walker at baseline. Pt is not appropriate for a taxi voucher or bus pass. Pt will need [facility] transport back to LTC facility.  [facility]AR was arranged to transport pt back to the facility. CSW called pt's son and provided him with an update. Pt's son expressed his frustration with the confusion. He shared his appreciation for finally getting pt's transport back to [facility]. He stated he has contacted APS about the situation and will let them know it has been resolved.    Valentina Shaggy.Kerrilyn Azbill, MSW, LCSWA [facility]  Transitions of Care Clinical Social Worker I Direct Dial: [phone]  Fax: [phone] [email or web address]

## 2023-07-17 NOTE — ED Notes (Signed)
I spoke to the patient's son, Bearl Mulberry, again. He advised he would not be able to send a LYFT to transport his mother. Mr Ladene Artist asked that his mother be placed in the waiting room. He stated he would come back to get her. The patient was taken to the waiting room as he requested

## 2023-07-17 NOTE — ED Notes (Signed)
[facility], they will not pickup pt on the weekend

## 2023-07-17 NOTE — ED Notes (Addendum)
I spoke to the patient's son, Bearl Mulberry, and told him [facility] advised they do not transport on the weekend. He advise to put her in the waiting room and he would arrange for a LYFT to pick her up and transport her to [facility]

## 2023-07-17 NOTE — ED Provider Notes (Signed)
  Physical Exam  BP (!) 147/111   Pulse 68   Temp 97.7 F (36.5 C) (Oral)   Resp 18   Ht 5\' 5"  (1.651 m)   Wt 63.5 kg   SpO2 95%   BMI 23.30 kg/m   Physical Exam  Procedures  Procedures  ED Course / MDM   Clinical Course as of 07/17/23 1735  Sun Jul 17, 2023  7829 Assumed care from Dr. Manus Gunning.  82 year old female with a history of dementia and COPD as well as an abdominal surgery (unknown exactly what it was) who presented to the emergency department with abdominal pain since last night.  Very limited history due to her dementia.  Getting a CT scan and urinalysis. [RP]  0802 CT scan without acute findings.  Patient reassessed and is ambulatory and is feeling better.  Able to tolerate p.o.  Will have her follow-up with her primary doctor [RP]    Clinical Course User Index [RP] Rondel Baton, MD   Medical Decision Making Amount and/or Complexity of Data Reviewed Labs: ordered. Radiology: ordered.  Risk OTC drugs. Prescription drug management.       Rondel Baton, MD 07/17/23 [phone]

## 2023-07-17 NOTE — Discharge Instructions (Addendum)
You were seen for your abdominal pain in the emergency department.   At home, please take miralax daily for your constipation.    Check your MyChart online for the results of any tests that had not resulted by the time you left the emergency department.   Follow-up with your primary doctor in 2-3 days regarding your visit.    Return immediately to the emergency department if you experience any of the following: worsening pain, vomiting, or any other concerning symptoms.    Thank you for visiting our Emergency Department. It was a pleasure taking care of you today.

## 2023-07-17 NOTE — ED Notes (Addendum)
The nursing supervisor from [facility] called to say that there was an APS case against pt's son. [employer] and [employer] made aware of the situation. SW was notified and pt was given a taxi voucher. SW called back and stated facility felt pt was not appropriate for a taxi. Pt readmitted and PTAR contacted.

## 2023-07-17 NOTE — ED Triage Notes (Signed)
Pt arrives via [facility] from [facility]. As per PT[facility] pt family demanded to bring her in the ED  because of abdominal pain at lower side rated 7/10 that started few hours ago. Denies Nausea and Vomiting. Last bowel movement yesterday. With History of Dementia.  Ptar Vitals BP 140/90 HR 65 RR 20 O2sat 92 RA

## 2023-07-17 NOTE — ED Notes (Addendum)
Note placed on wrong patient.

## 2023-07-17 NOTE — ED Notes (Signed)
ED Provider at bedside. 

## 2023-07-17 NOTE — ED Provider Notes (Signed)
[place] EMERGENCY DEPARTMENT AT [facility] Provider Note   CSN: [id number] Arrival date & time: 07/17/23  1027     History  Chief Complaint  Patient presents with   Abdominal Pain    Brenda Gibson is a 82 y.o. female.  Level 5 Caveat for dementia.  Patient from facility with abdominal pain that onset "this evening."  She is a poor historian.  Describes diffuse crampy lower abdominal pain that is constant.  Nothing makes it better, nothing makes it worse.  Denies nausea, vomiting, diarrhea, constipation, pain with urination or blood in the urine.  No vaginal bleeding or discharge.  Reportedly had some kind of surgery in the past for small bowel obstruction but she is not clear in the details.  Still think she has a gallbladder and appendix.  She has a history of COPD, hypertension, hyperglycemia and previous tobacco abuse.  States she was eating and drinking normally during the day yesterday.  No chest pain or shortness of breath.  The history is provided by the patient.  Abdominal Pain Associated symptoms: no chest pain, no constipation, no cough, no diarrhea, no dysuria, no fever, no hematuria, no nausea, no shortness of breath and no vomiting        Home Medications Prior to Admission medications   Medication Sig Start Date End Date Taking? Authorizing Provider  hydrochlorothiazide (HYDRODIURIL) 12.5 MG tablet Take 1 tablet (12.5 mg total) by mouth daily. 11/05/22   Rhetta Mura, MD  pantoprazole (PROTONIX) 40 MG tablet Take 1 tablet (40 mg total) by mouth daily. 11/05/22   Rhetta Mura, MD  sorbitol 70 % SOLN Take 30 mLs by mouth daily. 11/05/22   Rhetta Mura, MD      Allergies    Patient has no known allergies.    Review of Systems   Review of Systems  Constitutional:  Negative for activity change, appetite change and fever.  HENT:  Negative for congestion and rhinorrhea.   Respiratory:  Negative for cough, chest tightness and  shortness of breath.   Cardiovascular:  Negative for chest pain.  Gastrointestinal:  Positive for abdominal pain. Negative for constipation, diarrhea, nausea and vomiting.  Genitourinary:  Negative for dysuria and hematuria.  Musculoskeletal:  Negative for arthralgias and myalgias.  Skin:  Negative for rash.  Neurological:  Negative for dizziness, weakness and headaches.   all other systems are negative except as noted in the HPI and PMH.    Physical Exam Updated Vital Signs BP (!) 172/122 (BP Location: Right Arm)   Pulse (!) 52   Temp 97.9 F (36.6 C) (Oral)   Resp 18   Ht 5\' 5"  (1.651 m)   Wt 63.5 kg   SpO2 92%   BMI 23.30 kg/m  Physical Exam Vitals and nursing note reviewed.  Constitutional:      General: She is not in acute distress.    Appearance: She is well-developed.  HENT:     Head: Normocephalic and atraumatic.     Mouth/Throat:     Pharynx: No oropharyngeal exudate.  Eyes:     Conjunctiva/sclera: Conjunctivae normal.     Pupils: Pupils are equal, round, and reactive to light.  Neck:     Comments: No meningismus. Cardiovascular:     Rate and Rhythm: Normal rate and regular rhythm.     Heart sounds: Normal heart sounds. No murmur heard. Pulmonary:     Effort: Pulmonary effort is normal. No respiratory distress.     Breath sounds:  Normal breath sounds.  Abdominal:     Palpations: Abdomen is soft.     Tenderness: There is abdominal tenderness. There is guarding. There is no rebound.     Comments: Diffuse tenderness, periumbilically with voluntary guarding  Musculoskeletal:        General: No tenderness. Normal range of motion.     Cervical back: Normal range of motion and neck supple.  Skin:    General: Skin is warm.  Neurological:     Mental Status: She is alert and oriented to person, place, and time.     Cranial Nerves: No cranial nerve deficit.     Motor: No abnormal muscle tone.     Coordination: Coordination normal.     Comments: No ataxia on  finger to nose bilaterally. No pronator drift. 5/5 strength throughout. CN 2-12 intact.Equal grip strength. Sensation intact.   Psychiatric:        Behavior: Behavior normal.     ED Results / Procedures / Treatments   Labs (all labs ordered are listed, but only abnormal results are displayed) Labs Reviewed  CBC WITH DIFFERENTIAL/PLATELET - Abnormal; Notable for the following components:      Result Value   WBC 10.8 (*)    RBC 5.43 (*)    Hemoglobin 11.2 (*)    MCV 72.4 (*)    MCH 20.6 (*)    MCHC 28.5 (*)    RDW 18.0 (*)    Neutro Abs 8.8 (*)    All other components within normal limits  COMPREHENSIVE METABOLIC PANEL - Abnormal; Notable for the following components:   Chloride 96 (*)    Glucose, Bld 119 (*)    BUN 30 (*)    All other components within normal limits  LIPASE, BLOOD  LACTIC ACID, PLASMA  URINALYSIS, ROUTINE W REFLEX MICROSCOPIC    EKG None  Radiology No results found.  Procedures Procedures    Medications Ordered in ED Medications  lactated ringers bolus 1,000 mL (has no administration in time range)  ondansetron (ZOFRAN) injection 4 mg (has no administration in time range)  fentaNYL (SUBLIMAZE) injection 50 mcg (has no administration in time range)    ED Course/ Medical Decision Making/ A&P Clinical Course as of 07/17/23 0716  Sun Jul 17, 2023  0712 Hx of dementia and copd. Having abd pain since last night but no other symptoms. Getting ct scan. Has large surgical scars on abdomen but unclear what surgery.  [RP]    Clinical Course User Index [RP] Rondel Baton, MD                                 Medical Decision Making Amount and/or Complexity of Data Reviewed Labs: ordered. Decision-making details documented in ED Course. Radiology: ordered and independent interpretation performed. Decision-making details documented in ED Course. ECG/medicine tests: ordered and independent interpretation performed. Decision-making details documented  in ED Course.  Risk Prescription drug management.   Lower abdominal pain for several hours.  Previous bowel obstruction history.  Denies vomiting, constipation or diarrhea.  Stable vital signs.  Soft but tender.  Patient given fluids, pain and nausea medications.  Labs are reassuring.  Normal lactate.  No significant leukocytosis.  LFTs and lipase are normal.  CT scan obtained to further evaluate her abdominal pain and discomfort with concern for possible bowel obstruction.  At shift change, CT scan and urinalysis are pending.  Care transferred to Dr. Eloise Harman.  Rule out small bowel obstruction versus other acute surgical pathology.        Final Clinical Impression(s) / ED Diagnoses Final diagnoses:  None    Rx / DC Orders ED Discharge Orders     None         Kahli Mayon, Jeannett Senior, MD 07/17/23 [phone]

## 2023-07-18 ENCOUNTER — Other Ambulatory Visit (HOSPITAL_COMMUNITY): Payer: Self-pay

## 2023-07-28 ENCOUNTER — Other Ambulatory Visit (HOSPITAL_COMMUNITY): Payer: Self-pay

## 2023-10-10 ENCOUNTER — Other Ambulatory Visit: Payer: Self-pay

## 2023-10-10 DIAGNOSIS — I739 Peripheral vascular disease, unspecified: Secondary | ICD-10-CM

## 2023-10-19 NOTE — Progress Notes (Signed)
Patient ID: Brenda Gibson, female   DOB: 1941-05-16, 82 y.o.   MRN: [id number]  Reason for Consult: New Patient (Initial Visit)   Referred by No ref. provider found  Subjective:     HPI  Brenda Gibson is a 82 y.o. female with history of HTN, HLD, osteoporosis who presents for evaluation of PAD.  She is currently living in a nursing facility and is in wheelchair today although reports that she does walk and denies any pain with walking.  Specifically she denies any life-limiting claudication, rest pain or tissue loss.  She denies any previous vascular interventions.  She reports that she does smoke but she has not had a cigarette in a few months, since she has been at the nursing facility  Past Medical History:  Diagnosis Date   Basal cell carcinoma of ala nasi    Cataract    COVID-19 virus infection 05/22/2021   Essential hypertension, benign 06/05/2013   Mild, progressive hypertension. Isolated systolic hypertension.   Serial BPs:  --08/02/13: 130/80, recent measurements from home range from 137/73 to 149/75 with higher measurement being when skipping furosemide due to overactive bladder symptoms --06/05/13: 150/86 --02/22/13: 118/60   Hyperglycemia 05/22/2021   Hypertension    Knee pain, bilateral    Measles    Myalgia    Osteoporosis 08/12/2014   Seborrheic dermatitis of scalp    Small bowel obstruction (HCC)    Tobacco abuse 02/24/2011   Varicella    Family History  Problem Relation Age of Onset   Alcohol abuse Mother    Cancer Father 22       stomach cancer   Prostate cancer Father    Hypertension Maternal Grandfather    Diabetes Neg Hx    Heart disease Neg Hx    Past Surgical History:  Procedure Laterality Date   BASAL CELL CARCINOMA EXCISION     '11 ([facility])   CATARACT EXTRACTION W/ INTRAOCULAR LENS IMPLANT     right eye. '09 (Dr. Jettie Pagan)   INTRAMEDULLARY (IM) NAIL INTERTROCHANTERIC Right 10/27/2022   Procedure: INTRAMEDULLARY (IM) NAIL INTERTROCHANTERIC;  Surgeon:  London Sheer, MD;  Location: [facility];  Service: Orthopedics;  Laterality: Right;   LAPAROTOMY N/A 06/25/2016   Procedure: EXPLORATORY LAPAROTOMY, lysis of adhesion;  Surgeon: Abigail Miyamoto, MD;  Location: [facility];  Service: General;  Laterality: N/A;   LYSIS OF ADHESION  06/25/2016   Dr Carman Ching    Short Social History:  Social History   Tobacco Use   Smoking status: Some Days    Current packs/day: 0.60    Average packs/day: 0.6 packs/day for 52.0 years (31.2 ttl pk-yrs)    Types: Cigarettes   Smokeless tobacco: Never   Tobacco comments:    Pt smokes 5 cigarettes per day  Substance Use Topics   Alcohol use: Not Currently    Comment: rare use    No Known Allergies  Current Outpatient Medications  Medication Sig Dispense Refill   acetaminophen (TYLENOL) 500 MG tablet Take 1,000 mg by mouth every 8 (eight) hours as needed for moderate pain.     aspirin EC 81 MG tablet Take 81 mg by mouth in the morning and at bedtime. Swallow whole.     Calcium-Vitamin D (CALTRATE 600 PLUS-VIT D PO) Take 1 tablet by mouth in the morning and at bedtime. 600 mg-20 mcg (800 unit)     hydrochlorothiazide (HYDRODIURIL) 12.5 MG tablet Take 1 tablet (12.5 mg total) by mouth daily.  Ipratropium-Albuterol (COMBIVENT RESPIMAT) 20-100 MCG/ACT AERS respimat Inhale 1 puff into the lungs in the morning and at bedtime.     lactulose (CHRONULAC) 10 GM/15ML solution Take 13.3 g by mouth 2 (two) times daily.     Multiple Vitamins-Minerals (MULTIVITAMIN WITH MINERALS) tablet Take 1 tablet by mouth at bedtime.     pantoprazole (PROTONIX) 40 MG tablet Take 1 tablet (40 mg total) by mouth daily. 30 tablet 0   polyethylene glycol powder (MIRALAX) 17 GM/SCOOP powder Take 17 g by mouth daily. 238 g 0   Selenium Sulfide 2.25 % SHAM Apply 20 mLs topically See admin instructions. Apply 20 ml to scalp on Monday, Wednesday, and Friday. Leave on scalp for 5 minutes before rinsing     senna (SENOKOT) 8.6 MG TABS tablet  Take 8.6 mg by mouth 2 (two) times daily as needed (bowel aid).     No current facility-administered medications for this visit.    REVIEW OF SYSTEMS  Negative other than noted in HPI     Objective:  Objective   Vitals:   10/21/23 0848  BP: (!) 145/86  Pulse: 71  Resp: 20  Temp: 98 F (36.7 C)  SpO2: 90%  Weight: 140 lb (63.5 kg)  Height: 5\' 5"  (1.651 m)   Body mass index is 23.3 kg/m.  Physical Exam General: no acute distress Cardiac: hemodynamically stable, nontachycardic Pulm: normal work of breathing Neuro: alert, no focal deficit Extremities: no edema, cyanosis or wounds Vascular:   Right: Nonpalpable pedal's, unable to palpate femoral due to sitting in wheelchair  Left: Nonpalpable pedal's, unable to palpate femoral due to sitting in wheelchair   Data: ABI  LE arterial duplex 08/29/2023   Reviewed most recent CBC and CMP, creatinine 0.62     Assessment/Plan:     Brenda Gibson is a 82 y.o. female PAD but is asymptomatic at this time.  She denies any life-limiting claudication.  She denies rest pain or tissue loss.  We discussed natural history of PAD. Continue with best medical therapy as listed below Plan to follow-up in 1 year with repeat ABI Encouraged continued abstinence from tobacco products    Recommendations to optimize cardiovascular risk: Abstinence from all tobacco products. Blood glucose control with goal A1c < 7%. Blood pressure control with goal blood pressure < 140/90 mmHg. Lipid reduction therapy with goal LDL-C <100 mg/dL  Aspirin 81mg  PO QD.  Atorvastatin 40-80mg  PO QD (or other "high intensity" statin therapy).     Daria Pastures MD [facility]

## 2023-10-21 ENCOUNTER — Encounter: Payer: Self-pay | Admitting: Vascular Surgery

## 2023-10-21 ENCOUNTER — Ambulatory Visit (INDEPENDENT_AMBULATORY_CARE_PROVIDER_SITE_OTHER): Payer: Medicare Other | Admitting: Vascular Surgery

## 2023-10-21 ENCOUNTER — Other Ambulatory Visit: Payer: Self-pay

## 2023-10-21 ENCOUNTER — Ambulatory Visit (HOSPITAL_COMMUNITY)
Admission: RE | Admit: 2023-10-21 | Discharge: 2023-10-21 | Disposition: A | Discharge status: Home / Self Care | Source: Ambulatory Visit | Attending: Vascular Surgery | Admitting: Vascular Surgery

## 2023-10-21 VITALS — BP 145/86 | HR 71 | Temp 98.0°F | Resp 20 | Ht 65.0 in | Wt 140.0 lb

## 2023-10-21 DIAGNOSIS — I739 Peripheral vascular disease, unspecified: Secondary | ICD-10-CM

## 2023-11-08 ENCOUNTER — Other Ambulatory Visit: Payer: Self-pay

## 2023-11-08 DIAGNOSIS — I739 Peripheral vascular disease, unspecified: Secondary | ICD-10-CM

## 2023-11-27 ENCOUNTER — Emergency Department (HOSPITAL_COMMUNITY): Payer: Medicare Other

## 2023-11-27 ENCOUNTER — Inpatient Hospital Stay (HOSPITAL_COMMUNITY)
Admission: EM | Admit: 2023-11-27 | Discharge: 2023-12-08 | DRG: 193 | Disposition: A | Discharge status: Skilled Nursing Facility | Payer: Medicare Other | Source: Skilled Nursing Facility | Attending: Family Medicine | Admitting: Family Medicine

## 2023-11-27 ENCOUNTER — Encounter (HOSPITAL_COMMUNITY): Payer: Self-pay

## 2023-11-27 ENCOUNTER — Other Ambulatory Visit: Payer: Self-pay

## 2023-11-27 DIAGNOSIS — K219 Gastro-esophageal reflux disease without esophagitis: Secondary | ICD-10-CM | POA: Diagnosis present

## 2023-11-27 DIAGNOSIS — J9601 Acute respiratory failure with hypoxia: Secondary | ICD-10-CM | POA: Diagnosis present

## 2023-11-27 DIAGNOSIS — J121 Respiratory syncytial virus pneumonia: Principal | ICD-10-CM | POA: Diagnosis present

## 2023-11-27 DIAGNOSIS — M810 Age-related osteoporosis without current pathological fracture: Secondary | ICD-10-CM | POA: Diagnosis present

## 2023-11-27 DIAGNOSIS — Z1152 Encounter for screening for COVID-19: Secondary | ICD-10-CM

## 2023-11-27 DIAGNOSIS — Z6820 Body mass index (BMI) 20.0-20.9, adult: Secondary | ICD-10-CM

## 2023-11-27 DIAGNOSIS — L89892 Pressure ulcer of other site, stage 2: Secondary | ICD-10-CM | POA: Clinically undetermined

## 2023-11-27 DIAGNOSIS — R0603 Acute respiratory distress: Secondary | ICD-10-CM | POA: Diagnosis present

## 2023-11-27 DIAGNOSIS — Z716 Tobacco abuse counseling: Secondary | ICD-10-CM

## 2023-11-27 DIAGNOSIS — E440 Moderate protein-calorie malnutrition: Secondary | ICD-10-CM | POA: Diagnosis present

## 2023-11-27 DIAGNOSIS — Z720 Tobacco use: Secondary | ICD-10-CM | POA: Diagnosis present

## 2023-11-27 DIAGNOSIS — L8990 Pressure ulcer of unspecified site, unspecified stage: Secondary | ICD-10-CM

## 2023-11-27 DIAGNOSIS — F259 Schizoaffective disorder, unspecified: Secondary | ICD-10-CM | POA: Diagnosis present

## 2023-11-27 DIAGNOSIS — I739 Peripheral vascular disease, unspecified: Secondary | ICD-10-CM | POA: Diagnosis present

## 2023-11-27 DIAGNOSIS — Z85828 Personal history of other malignant neoplasm of skin: Secondary | ICD-10-CM

## 2023-11-27 DIAGNOSIS — Z515 Encounter for palliative care: Secondary | ICD-10-CM

## 2023-11-27 DIAGNOSIS — Z7982 Long term (current) use of aspirin: Secondary | ICD-10-CM

## 2023-11-27 DIAGNOSIS — I160 Hypertensive urgency: Secondary | ICD-10-CM | POA: Diagnosis present

## 2023-11-27 DIAGNOSIS — Z7902 Long term (current) use of antithrombotics/antiplatelets: Secondary | ICD-10-CM

## 2023-11-27 DIAGNOSIS — Z800 Family history of malignant neoplasm of digestive organs: Secondary | ICD-10-CM

## 2023-11-27 DIAGNOSIS — F0390 Unspecified dementia without behavioral disturbance: Secondary | ICD-10-CM | POA: Diagnosis present

## 2023-11-27 DIAGNOSIS — N3281 Overactive bladder: Secondary | ICD-10-CM | POA: Diagnosis present

## 2023-11-27 DIAGNOSIS — Z8616 Personal history of COVID-19: Secondary | ICD-10-CM

## 2023-11-27 DIAGNOSIS — Z66 Do not resuscitate: Secondary | ICD-10-CM | POA: Diagnosis not present

## 2023-11-27 DIAGNOSIS — J210 Acute bronchiolitis due to respiratory syncytial virus: Secondary | ICD-10-CM | POA: Diagnosis present

## 2023-11-27 DIAGNOSIS — I10 Essential (primary) hypertension: Secondary | ICD-10-CM | POA: Diagnosis present

## 2023-11-27 DIAGNOSIS — B338 Other specified viral diseases: Principal | ICD-10-CM

## 2023-11-27 DIAGNOSIS — L89616 Pressure-induced deep tissue damage of right heel: Secondary | ICD-10-CM | POA: Diagnosis present

## 2023-11-27 DIAGNOSIS — Z8249 Family history of ischemic heart disease and other diseases of the circulatory system: Secondary | ICD-10-CM

## 2023-11-27 DIAGNOSIS — Z79899 Other long term (current) drug therapy: Secondary | ICD-10-CM

## 2023-11-27 DIAGNOSIS — Z811 Family history of alcohol abuse and dependence: Secondary | ICD-10-CM

## 2023-11-27 DIAGNOSIS — J441 Chronic obstructive pulmonary disease with (acute) exacerbation: Secondary | ICD-10-CM | POA: Diagnosis present

## 2023-11-27 DIAGNOSIS — E876 Hypokalemia: Secondary | ICD-10-CM | POA: Diagnosis not present

## 2023-11-27 DIAGNOSIS — Z781 Physical restraint status: Secondary | ICD-10-CM

## 2023-11-27 DIAGNOSIS — J9602 Acute respiratory failure with hypercapnia: Secondary | ICD-10-CM | POA: Diagnosis present

## 2023-11-27 DIAGNOSIS — G9341 Metabolic encephalopathy: Secondary | ICD-10-CM | POA: Diagnosis present

## 2023-11-27 DIAGNOSIS — J440 Chronic obstructive pulmonary disease with acute lower respiratory infection: Secondary | ICD-10-CM | POA: Diagnosis present

## 2023-11-27 DIAGNOSIS — F17210 Nicotine dependence, cigarettes, uncomplicated: Secondary | ICD-10-CM | POA: Diagnosis present

## 2023-11-27 DIAGNOSIS — R739 Hyperglycemia, unspecified: Secondary | ICD-10-CM | POA: Diagnosis present

## 2023-11-27 DIAGNOSIS — Z8042 Family history of malignant neoplasm of prostate: Secondary | ICD-10-CM

## 2023-11-27 LAB — BLOOD GAS, VENOUS
Acid-Base Excess: 11.6 mmol/L — ABNORMAL HIGH (ref 0.0–2.0)
Bicarbonate: 44.6 mmol/L — ABNORMAL HIGH (ref 20.0–28.0)
O2 Saturation: 50.7 %
Patient temperature: 37
pCO2, Ven: 114 mm[Hg] (ref 44–60)
pH, Ven: 7.2 — ABNORMAL LOW (ref 7.25–7.43)
pO2, Ven: 31 mm[Hg] — CL (ref 32–45)

## 2023-11-27 LAB — BLOOD GAS, ARTERIAL
Acid-Base Excess: 13.4 mmol/L — ABNORMAL HIGH (ref 0.0–2.0)
Bicarbonate: 42.4 mmol/L — ABNORMAL HIGH (ref 20.0–28.0)
Delivery systems: POSITIVE
Drawn by: 11249
Expiratory PAP: 5 cm[H2O]
FIO2: 40 %
Inspiratory PAP: 14 cm[H2O]
Mode: POSITIVE
O2 Saturation: 97.2 %
Patient temperature: 36.9
pCO2 arterial: 75 mm[Hg] (ref 32–48)
pH, Arterial: 7.36 (ref 7.35–7.45)
pO2, Arterial: 66 mm[Hg] — ABNORMAL LOW (ref 83–108)

## 2023-11-27 LAB — TROPONIN I (HIGH SENSITIVITY)
Troponin I (High Sensitivity): 6 ng/L (ref <18)
Troponin I (High Sensitivity): 8 ng/L (ref <18)

## 2023-11-27 LAB — RESP PANEL BY RT-PCR (RSV, FLU A&B, COVID)  RVPGX2
Influenza A by PCR: NEGATIVE
Influenza B by PCR: NEGATIVE
Resp Syncytial Virus by PCR: POSITIVE — AB
SARS Coronavirus 2 by RT PCR: NEGATIVE

## 2023-11-27 LAB — I-STAT CHEM 8, ED
BUN: 29 mg/dL — ABNORMAL HIGH (ref 8–23)
Calcium, Ion: 1.14 mmol/L — ABNORMAL LOW (ref 1.15–1.40)
Chloride: 93 mmol/L — ABNORMAL LOW (ref 98–111)
Creatinine, Ser: 0.9 mg/dL (ref 0.44–1.00)
Glucose, Bld: 155 mg/dL — ABNORMAL HIGH (ref 70–99)
HCT: 44 % (ref 36.0–46.0)
Hemoglobin: 15 g/dL (ref 12.0–15.0)
Potassium: 4 mmol/L (ref 3.5–5.1)
Sodium: 134 mmol/L — ABNORMAL LOW (ref 135–145)
TCO2: 37 mmol/L — ABNORMAL HIGH (ref 22–32)

## 2023-11-27 LAB — BRAIN NATRIURETIC PEPTIDE: B Natriuretic Peptide: 155.5 pg/mL — ABNORMAL HIGH (ref 0.0–100.0)

## 2023-11-27 MED ORDER — IPRATROPIUM-ALBUTEROL 0.5-2.5 (3) MG/3ML IN SOLN
3.0000 mL | Freq: Once | RESPIRATORY_TRACT | Status: AC
  Administered 2023-11-27: 3 mL via RESPIRATORY_TRACT
  Filled 2023-11-27: qty 3

## 2023-11-27 NOTE — Assessment & Plan Note (Signed)
Counseling once patient is stable we will offer nicotine patch in the meantime.

## 2023-11-27 NOTE — Assessment & Plan Note (Signed)
Vitals:   11/27/23 1812 11/27/23 1845 11/27/23 2015 11/27/23 2336  BP: (!) 191/77 (!) 143/77 (!) 151/63 (!) 122/56  Home regimen consists of HCTZ 12.5. Will continue the same, monitor for electrolytes and kidney function.

## 2023-11-27 NOTE — Assessment & Plan Note (Signed)
Continue patient on COPD exacerbation protocol with steroids DuoNebs and as needed MDI.

## 2023-11-27 NOTE — Progress Notes (Signed)
RT placed pt on BIPAP in ED due to AMS.

## 2023-11-27 NOTE — H&P (Signed)
History and Physical    Patient: Brenda Gibson IHK:[id number] DOB: [birth year] DOA: 11/27/2023 DOS: the patient was seen and examined on 11/28/2023 PCP: Pcp, No  Patient coming from: [facility] Chief complaint: Chief Complaint  Patient presents with   Respiratory Distress   HPI:  Brenda Gibson is a 83 y.o. female with past medical history  of essential hypertension, COPD, osteoporosis, history of small bowel obstruction, protein calorie malnutrition presenting in respiratory distress via EMS from skilled nursing facility patient's had progressive worsening of shortness of breath cough wheezing. Since Friday , patient has been short of breath and in respiratory distress patient was tripoding upon EMS arrival patient was started on oxygen given Solu-Medrol magnesium IV 2 g 2 DuoNeb treatments per report patient is intermittently lethargic however oriented and easily arousable able to speak in complete sentences.  Patient placed on BiPAP due to respiratory distress.   >>ED Course: In emergency room patient is on BiPAP tachypneic, mental status been improving but HPI limited. Initial presentation of O2 sats of 88%. Vitals:   11/27/23 2215 11/27/23 2231 11/27/23 2336 11/28/23 0008  BP:   (!) 122/56   Pulse: 73  70 72  Temp:  98.3 F (36.8 C) 98.8 F (37.1 C)   Resp: 16  19 20   Height:      Weight:      SpO2: 99%  95% 96%  TempSrc:  Axillary Axillary   BMI (Calculated):      ED evaluation  so far shows: EKGs abnormal showing sinus rhythm 71 PR of 157 QRS of 105, QTc of 420, nonspecific ST changes in lateral leads, left atrial enlargement.  BNP of 155.5, troponin of 6 Initial venous blood gas showed a pCO2 of 114 pO2 of 31 pH of 7.2. Repeat ABG showed pH of 7.3 pCO2 of 75 pO2 of 66 on BiPAP. Labs show sodium of 134 chloride 93 glucose 155 normal kidney function, hemoglobin of 15 and hematocrit of 44. Respiratory panel is positive for RSV.  In the emergency room  pt has  received the following treatment thus far: Medications  ipratropium-albuterol (DUONEB) 0.5-2.5 (3) MG/3ML nebulizer solution 3 mL (3 mLs Nebulization Given 11/27/23 1836)   Review of Systems  Unable to perform ROS: Acuity of condition  Respiratory:  Positive for cough, shortness of breath and wheezing.   Neurological:  Positive for weakness.   Past Medical History:  Diagnosis Date   Basal cell carcinoma of ala nasi    Cataract    COVID-19 virus infection 05/22/2021   Essential hypertension, benign 06/05/2013   Mild, progressive hypertension. Isolated systolic hypertension.   Serial BPs:  --08/02/13: 130/80, recent measurements from home range from 137/73 to 149/75 with higher measurement being when skipping furosemide due to overactive bladder symptoms --06/05/13: 150/86 --02/22/13: 118/60   Hyperglycemia 05/22/2021   Hypertension    Knee pain, bilateral    Measles    Myalgia    Osteoporosis 08/12/2014   Seborrheic dermatitis of scalp    Small bowel obstruction (HCC)    Tobacco abuse 02/24/2011   Varicella    Past Surgical History:  Procedure Laterality Date   BASAL CELL CARCINOMA EXCISION     '11 ([facility])   CATARACT EXTRACTION W/ INTRAOCULAR LENS IMPLANT     right eye. '09 (Dr. Jettie Pagan)   INTRAMEDULLARY (IM) NAIL INTERTROCHANTERIC Right 10/27/2022   Procedure: INTRAMEDULLARY (IM) NAIL INTERTROCHANTERIC;  Surgeon: London Sheer, MD;  Location: [facility];  Service: Orthopedics;  Laterality: Right;  LAPAROTOMY N/A 06/25/2016   Procedure: EXPLORATORY LAPAROTOMY, lysis of adhesion;  Surgeon: Abigail Miyamoto, MD;  Location: [facility];  Service: General;  Laterality: N/A;   LYSIS OF ADHESION  06/25/2016   Dr Carman Ching    reports that she has been smoking cigarettes. She has a 31.2 pack-year smoking history. She has never used smokeless tobacco. She reports that she does not currently use alcohol. She reports that she does not use drugs.  No Known Allergies  Family History  Problem  Relation Age of Onset   Alcohol abuse Mother    Cancer Father 34       stomach cancer   Prostate cancer Father    Hypertension Maternal Grandfather    Diabetes Neg Hx    Heart disease Neg Hx     Prior to Admission medications   Medication Sig Start Date End Date Taking? Authorizing Provider  ascorbic acid (VITAMIN C) 500 MG tablet Take 500 mg by mouth daily.   Yes [provider]  atorvastatin (LIPITOR) 80 MG tablet Take 80 mg by mouth daily. 11/25/23  Yes [provider]  Calcium-Vitamin D (CALTRATE 600 PLUS-VIT D PO) Take 1 tablet by mouth in the morning and at bedtime. 600 mg-20 mcg (800 unit)   Yes [provider]  clopidogrel (PLAVIX) 75 MG tablet Take 75 mg by mouth daily. 09/30/23  Yes [provider]  doxycycline (VIBRAMYCIN) 100 MG capsule Take 100 mg by mouth 2 (two) times daily. 11/25/23  Yes [provider]  hydrochlorothiazide (HYDRODIURIL) 12.5 MG tablet Take 1 tablet (12.5 mg total) by mouth daily. 11/05/22  Yes Rhetta Mura, MD  Ipratropium-Albuterol (COMBIVENT RESPIMAT) 20-100 MCG/ACT AERS respimat Inhale 1 puff into the lungs in the morning and at bedtime.   Yes [provider]  lactulose, encephalopathy, (CHRONULAC) 10 GM/15ML SOLN Take 15 g by mouth 2 (two) times daily. 11/11/23  Yes [provider]  Multiple Vitamins-Minerals (MULTIVITAMIN WITH MINERALS) tablet Take 1 tablet by mouth at bedtime.   Yes [provider]  pantoprazole (PROTONIX) 20 MG tablet Take 20 mg by mouth daily. 11/01/23  Yes [provider]  predniSONE (DELTASONE) 20 MG tablet Take 20 mg by mouth 2 (two) times daily. 11/25/23  Yes [provider]  Selenium Sulfide 2.25 % SHAM Apply 20 mLs topically See admin instructions. Apply 20 ml to scalp on Monday, Wednesday, and Friday. Leave on scalp for 5 minutes before rinsing   Yes [provider]  senna (SENOKOT) 8.6 MG TABS tablet Take 8.6 mg by mouth 2  (two) times daily as needed (bowel aid).   Yes [provider]  sennosides-docusate sodium (SENOKOT-S) 8.6-50 MG tablet Take 2 tablets by mouth at bedtime.   Yes [provider]  acetaminophen (TYLENOL) 500 MG tablet Take 1,000 mg by mouth every 8 (eight) hours as needed for moderate pain.    [provider]  aspirin EC 81 MG tablet Take 81 mg by mouth in the morning and at bedtime. Swallow whole.    [provider]  polyethylene glycol powder (MIRALAX) 17 GM/SCOOP powder Take 17 g by mouth daily. 07/17/23   Rondel Baton, MD     Vitals:   11/27/23 2215 11/27/23 2231 11/27/23 2336 11/28/23 0008  BP:   (!) 122/56   Pulse: 73  70 72  Resp: 16  19 20   Temp:  98.3 F (36.8 C) 98.8 F (37.1 C)   TempSrc:  Axillary Axillary   SpO2: 99%  95% 96%  Weight:      Height:       Physical Exam Vitals reviewed.  Constitutional:      General: She is in acute distress.     Interventions: Face mask in place.  HENT:     Head: Normocephalic and atraumatic.  Eyes:     General: Lids are normal.  Cardiovascular:     Rate and Rhythm: Normal rate and regular rhythm.     Pulses:          Dorsalis pedis pulses are 2+ on the right side and 2+ on the left side.       Posterior tibial pulses are 2+ on the right side and 2+ on the left side.     Heart sounds: Normal heart sounds. No murmur heard. Pulmonary:     Effort: Tachypnea present.     Breath sounds: Examination of the right-upper field reveals wheezing. Examination of the left-upper field reveals wheezing. Examination of the right-middle field reveals wheezing. Examination of the left-middle field reveals wheezing. Examination of the right-lower field reveals wheezing. Examination of the left-lower field reveals wheezing. Wheezing present.     Comments: Bipap. Abdominal:     General: There is no distension.     Tenderness: There is no abdominal tenderness.  Musculoskeletal:     Right lower leg: No edema.      Left lower leg: No edema.  Neurological:     General: No focal deficit present.     Mental Status: She is oriented to person, place, and time and easily aroused. She is lethargic.     Cranial Nerves: Cranial nerves 2-12 are intact.     Motor: Motor function is intact.    Labs on Admission: I have personally reviewed following labs and imaging studies Results for orders placed or performed during the hospital encounter of 11/27/23 (from the past 24 hours)  Resp panel by RT-PCR (RSV, Flu A&B, Covid) Anterior Nasal Swab     Status: Abnormal   Collection Time: 11/27/23  6:20 PM   Specimen: Anterior Nasal Swab  Result Value Ref Range   SARS Coronavirus 2 by RT PCR NEGATIVE NEGATIVE   Influenza A by PCR NEGATIVE NEGATIVE   Influenza B by PCR NEGATIVE NEGATIVE   Resp Syncytial Virus by PCR POSITIVE (A) NEGATIVE  Brain natriuretic peptide     Status: Abnormal   Collection Time: 11/27/23  6:22 PM  Result Value Ref Range   B Natriuretic Peptide 155.5 (H) 0.0 - 100.0 pg/mL  Troponin I (High Sensitivity)     Status: None   Collection Time: 11/27/23  6:23 PM  Result Value Ref Range   Troponin I (High Sensitivity) 6 <18 ng/L  I-Stat Chem 8, ED     Status: Abnormal   Collection Time: 11/27/23  6:55 PM  Result Value Ref Range   Sodium 134 (L) 135 - 145 mmol/L   Potassium 4.0 3.5 - 5.1 mmol/L   Chloride 93 (L) 98 - 111 mmol/L   BUN 29 (H) 8 - 23 mg/dL   Creatinine, Ser 1.61 0.44 - 1.00 mg/dL   Glucose, Bld 096 (H) 70 - 99 mg/dL   Calcium, Ion 0.45 (L) 1.15 - 1.40 mmol/L   TCO2 37 (H) 22 - 32 mmol/L   Hemoglobin 15.0 12.0 - 15.0 g/dL   HCT 40.9 81.1 - 91.4 %  Blood gas, venous (at [facility])     Status: Abnormal   Collection Time: 11/27/23  7:20  PM  Result Value Ref Range   pH, Ven 7.2 (L) 7.25 - 7.43   pCO2, Ven 114 (HH) 44 - 60 mmHg   pO2, Ven 31 (LL) 32 - 45 mmHg   Bicarbonate 44.6 (H) 20.0 - 28.0 mmol/L   Acid-Base Excess 11.6 (H) 0.0 - 2.0 mmol/L   O2 Saturation 50.7 %   Patient  temperature 37.0   Blood gas, arterial (at [facility])     Status: Abnormal   Collection Time: 11/27/23  9:40 PM  Result Value Ref Range   FIO2 40 %   Delivery systems BILEVEL POSITIVE AIRWAY PRESSURE    Mode BILEVEL POSITIVE AIRWAY PRESSURE    Inspiratory PAP 14 cmH2O   Expiratory PAP 5 cmH2O   pH, Arterial 7.36 7.35 - 7.45   pCO2 arterial 75 (HH) 32 - 48 mmHg   pO2, Arterial 66 (L) 83 - 108 mmHg   Bicarbonate 42.4 (H) 20.0 - 28.0 mmol/L   Acid-Base Excess 13.4 (H) 0.0 - 2.0 mmol/L   O2 Saturation 97.2 %   Patient temperature 36.9    Collection site RIGHT BRACHIAL    Drawn by [id number]    Allens test (pass/fail) PASS PASS  Troponin I (High Sensitivity)     Status: None   Collection Time: 11/27/23 10:33 PM  Result Value Ref Range   Troponin I (High Sensitivity) 8 <18 ng/L   Recent Results (from the past 720 hours)  Resp panel by RT-PCR (RSV, Flu A&B, Covid) Anterior Nasal Swab     Status: Abnormal   Collection Time: 11/27/23  6:20 PM   Specimen: Anterior Nasal Swab  Result Value Ref Range Status   SARS Coronavirus 2 by RT PCR NEGATIVE NEGATIVE Final    Comment: (NOTE) SARS-CoV-2 target nucleic acids are NOT DETECTED.  The SARS-CoV-2 RNA is generally detectable in upper respiratory specimens during the acute phase of infection. The lowest concentration of SARS-CoV-2 viral copies this assay can detect is 138 copies/mL. A negative result does not preclude SARS-Cov-2 infection and should not be used as the sole basis for treatment or other patient management decisions. A negative result may occur with  improper specimen collection/handling, submission of specimen other than nasopharyngeal swab, presence of viral mutation(s) within the areas targeted by this assay, and inadequate number of viral copies(<138 copies/mL). A negative result must be combined with clinical observations, patient history, and epidemiological information. The expected result is Negative.  Fact Sheet for  Patients:  BloggerCourse.com  Fact Sheet for Healthcare Providers:  SeriousBroker.it  This test is no t yet approved or cleared by the [place] FDA and  has been authorized for detection and/or diagnosis of SARS-CoV-2 by FDA under an Emergency Use Authorization (EUA). This EUA will remain  in effect (meaning this test can be used) for the duration of the COVID-19 declaration under Section 564(b)(1) of the Act, 21 U.S.C.section 360bbb-3(b)(1), unless the authorization is terminated  or revoked sooner.       Influenza A by PCR NEGATIVE NEGATIVE Final   Influenza B by PCR NEGATIVE NEGATIVE Final    Comment: (NOTE) The Xpert Xpress SARS-CoV-2/FLU/RSV plus assay is intended as an aid in the diagnosis of influenza from Nasopharyngeal swab specimens and should not be used as a sole basis for treatment. Nasal washings and aspirates are unacceptable for Xpert Xpress SARS-CoV-2/FLU/RSV testing.  Fact Sheet for Patients: BloggerCourse.com  Fact Sheet for Healthcare Providers: SeriousBroker.it  This test is not yet approved or cleared by the [place] FDA and has been authorized for detection and/or diagnosis of SARS-CoV-2 by FDA under an Emergency Use Authorization (EUA). This EUA will remain in effect (meaning this test can be used) for the duration of the COVID-19 declaration under Section 564(b)(1) of the Act, 21 U.S.C. section 360bbb-3(b)(1), unless the authorization is terminated or revoked.     Resp Syncytial Virus by PCR POSITIVE (A) NEGATIVE Final    Comment: (NOTE) Fact Sheet for Patients: BloggerCourse.com  Fact Sheet for Healthcare Providers: SeriousBroker.it  This test is not yet approved or cleared by the [place] FDA and has been authorized for detection and/or diagnosis of SARS-CoV-2 by FDA under an  Emergency Use Authorization (EUA). This EUA will remain in effect (meaning this test can be used) for the duration of the COVID-19 declaration under Section 564(b)(1) of the Act, 21 U.S.C. section 360bbb-3(b)(1), unless the authorization is terminated or revoked.  Performed at [facility], [address], [place], [place] [address]    CBC:    Latest Ref Rng & Units 11/27/2023    6:55 PM 07/17/2023    4:53 AM 11/03/2022    3:40 AM  CBC  WBC 4.0 - 10.5 K/uL  10.8  10.4   Hemoglobin 12.0 - 15.0 g/dL 93.7  16.9  9.9   Hematocrit 36.0 - 46.0 % 44.0  39.3  33.2   Platelets 150 - 400 K/uL  338  391    Basic Metabolic Panel: Recent Labs  Lab 11/27/23 1855  NA 134*  K 4.0  CL 93*  GLUCOSE 155*  BUN 29*  CREATININE 0.90   Creatinine: Lab Results  Component Value Date   CREATININE 0.90 11/27/2023   CREATININE 0.62 07/17/2023   CREATININE 0.59 11/07/2022   Liver Function Tests:    Latest Ref Rng & Units 07/17/2023    4:53 AM 10/29/2022    3:58 AM 10/26/2022    8:57 AM  Hepatic Function  Total Protein 6.5 - 8.1 g/dL 7.7  5.8  6.6   Albumin 3.5 - 5.0 g/dL 4.1  2.8  3.5   AST 15 - 41 U/L 20  16  15    ALT 0 - 44 U/L 16  9  14    Alk Phosphatase 38 - 126 U/L 70  70  74   Total Bilirubin 0.3 - 1.2 mg/dL 0.4  0.6  0.7   Bilirubin, Direct 0.0 - 0.2 mg/dL   0.1    Coagulation Profile: No results for input(s): "INR", "PROTIME" in the last 168 hours. Cardiac Enzymes: No results for input(s): "CKTOTAL", "CKMB", "CKMBINDEX", "TROPONINI" in the last 168 hours. BNP (last 3 results) No results for input(s): "PROBNP" in the last 8760 hours. HbA1C: No results for input(s): "HGBA1C" in the last 72 hours. Lipid Profile: No results for input(s): "CHOL", "HDL", "LDLCALC", "TRIG", "CHOLHDL", "LDLDIRECT" in the last 72 hours.  Radiological Exams on Admission: DG Chest Port 1 View Result Date: 11/27/2023 CLINICAL DATA:  Respiratory distress EXAM: PORTABLE CHEST 1 VIEW  COMPARISON:  Chest x-ray 11/02/2022 FINDINGS: There some minimal patchy opacities in the right costophrenic angle. There is no pleural effusion or pneumothorax. The cardiomediastinal silhouette is within normal limits. No acute fractures are seen. IMPRESSION: Minimal patchy opacities in the right costophrenic angle, atelectasis versus infiltrate. Electronically Signed   By: Darliss Cheney M.D.   On: 11/27/2023 19:22    Data Reviewed: Relevant notes from primary care and specialist visits, past discharge summaries as available in EHR, including Care Everywhere.  Prior diagnostic testing as pertinent to current admission diagnoses, Updated medications and problem lists for reconciliation ED course, including vitals, labs, imaging, treatment and response to treatment,Triage notes, nursing and pharmacy notes and ED provider's notes Notable results as noted in HPI.Discussed case with EDMD/ ED APP/ or Specialty MD on call and as needed. Assessment & Plan Acute respiratory failure with hypoxia and hypercapnia (HCC) Patient presenting with acute respiratory failure hypoxia hypercapnia and respiratory distress. Chest x-ray today shows patchy opacities in the right costophrenic angle. Will continue patient on BiPAP. Low threshold for CTA chest. Requested procalcitonin and treat  for CAP.  Echocardiogram done to 3 years ago showed ejection fraction with diastolic dysfunction.  After clinical evaluation of the patient will consider 2D echocardiogram as well.  Troponin currently is normal and patient does have elevated BNP. Tobacco abuse Counseling once patient is stable we will offer nicotine patch in the meantime. Essential hypertension, benign Vitals:   11/27/23 1812 11/27/23 1845 11/27/23 2015 11/27/23 2336  BP: (!) 191/77 (!) 143/77 (!) 151/63 (!) 122/56  Home regimen consists of HCTZ 12.5. Will continue the same, monitor for electrolytes and kidney function.  COPD exacerbation (HCC) Continue patient  on COPD exacerbation protocol with steroids DuoNebs and as needed MDI. Respiratory distress As above, the initial event is unclear patient is not able to give complete history she is oriented but sleepy is on BiPAP.  ABG in AM. RSV (respiratory syncytial virus pneumonia) Will continue with as needed Mucinex or Tussionex.  Antibiotic regimen with Rocephin and azithromycin.     DVT prophylaxis:  Heparin  Consults:  None  Advance Care Planning:    Code Status: Full Code   Family Communication:  None  Disposition Plan:  SNF Severity of Illness: The appropriate patient status for this patient is INPATIENT. Inpatient status is judged to be reasonable and necessary in order to provide the required intensity of service to ensure the patient's safety. The patient's presenting symptoms, physical exam findings, and initial radiographic and laboratory data in the context of their chronic comorbidities is felt to place them at high risk for further clinical deterioration. Furthermore, it is not anticipated that the patient will be medically stable for discharge from the hospital within 2 midnights of admission.   * I certify that at the point of admission it is my clinical judgment that the patient will require inpatient hospital care spanning beyond 2 midnights from the point of admission due to high intensity of service, high risk for further deterioration and high frequency of surveillance required.*  Author: Gertha Calkin, MD 11/28/2023 1:09 AM  For on call review [email or web address].   Unresulted Labs (From admission, onward)     Start     Ordered   11/28/23 0700  Blood gas, arterial  Once,   R        11/28/23 0102   11/28/23 0500  Blood gas, venous  Once,   R        11/28/23 0012   11/27/23 2343  Strep pneumoniae urinary antigen  (COPD / Pneumonia / Cellulitis / Lower Extremity Wound)  Once,   R        11/28/23 0059   11/27/23 2312  Procalcitonin  Once,   URGENT       References:     Procalcitonin Lower Respiratory Tract Infection AND Sepsis Procalcitonin Algorithm   11/27/23 2311            Orders Placed This Encounter  Procedures  Critical Care   Resp panel by RT-PCR (RSV, Flu A&B, Covid) Anterior Nasal Swab   DG Chest Port 1 View   Brain natriuretic peptide   Blood gas, venous (at [facility])   Blood gas, arterial (at [facility])   Procalcitonin   Blood gas, venous   Strep pneumoniae urinary antigen   Blood gas, arterial   Diet NPO time specified Except for: Sips with Meds   ED Cardiac monitoring   Cardiac Monitoring Continuous x 24 hours Indications for use: Other; other indications for use: COPD exacerbation   Maintain IV access   Vital signs   Notify physician (specify)   Mobility Protocol: No Restrictions RN to initiate protocols based on patient's level of care   Refer to Sidebar Report Refer to ICU, Med-Surg, Progressive, and Step-Down Mobility Protocol Sidebars   Initiate Adult Central Line Maintenance and Catheter Protocol for patients with central line (CVC, PICC, Port, Hemodialysis, Trialysis)   If patient diabetic or glucose greater than 140 notify physician for Sliding Scale Insulin Orders   Intake and Output   Do not place and if present remove PureWick   Initiate Brenda Care Protocol   Initiate Carrier Fluid Protocol   Nurse to provide smoking / tobacco cessation education   RN may order General Admission PRN Orders utilizing "General Admission PRN medications" (through manage orders) for the following patient needs: allergy symptoms (Claritin), cold sores (Carmex), cough (Robitussin DM), eye irritation (Liquifilm Tears), hemorrhoids (Tucks), indigestion (Maalox), minor skin irritation (Hydrocortisone Cream), muscle pain Romeo Apple Gay), nose irritation (saline nasal spray) and sore throat (Chloraseptic spray).   Refer to Sidebar Report:   Apply Chronic Obstructive Pulmonary Disease Care Plan   Apply Pneumonia Care Plan   Full code   Consult to  hospitalist   Nutritional services consult   Consult to Transition of Care Team   Consult to respiratory care treatment   Pharmacy consult   Consult to respiratory care treatment (RT)   OT eval and treat   PT eval and treat   Bipap   Pulse oximetry check with vital signs   Oxygen therapy Mode or (Route): Nasal cannula; Liters Per Minute: 2; Keep O2 saturation between: greater than 92 %   I-Stat Chem 8, ED   EKG 12-Lead   EKG 12-Lead   ED EKG   Insert peripheral IV   Admit to Inpatient (patient's expected length of stay will be greater than 2 midnights or inpatient only procedure)   Aspiration precautions   Fall precautions   Critical care time spent in chart review management of the patient discussing patient care-35 minutes.

## 2023-11-27 NOTE — Assessment & Plan Note (Signed)
Patient presenting with acute respiratory failure hypoxia hypercapnia and respiratory distress. Chest x-ray today shows patchy opacities in the right costophrenic angle. Will continue patient on BiPAP. Low threshold for CTA chest. Requested procalcitonin and treat  for CAP.  Echocardiogram done to 3 years ago showed ejection fraction with diastolic dysfunction.  After clinical evaluation of the patient will consider 2D echocardiogram as well.  Troponin currently is normal and patient does have elevated BNP.

## 2023-11-27 NOTE — ED Triage Notes (Signed)
Pt biba from [facility]. Staff reported to EMS pt has been in resp distress since Friday. Upon their arrival patient in tripod position with simple mask. EMS gave 125 of solumedrol, 2 gm of magnesium IV and 2 duoneb treatments in route. Pt aaox4, intermittently lethargic. Able to speak in full sentences. Denies fever, chills, cp, dizziness at this time.

## 2023-11-27 NOTE — ED Provider Notes (Signed)
[place] EMERGENCY DEPARTMENT AT [facility] Provider Note   CSN: [id number] Arrival date & time: 11/27/23  1753     History  Chief Complaint  Patient presents with   Respiratory Distress    Brenda Gibson is a 83 y.o. female with a pmh history of COPD, hypertension, hyperglycemia, PAD, dementia and previous tobacco abuse.  EMS reports that the patient has been having difficulty breathing for the past 4 days.  She arrives on oxygen supplementation.  Patient with productive cough, no fevers.  She was given 2 g of IV magnesium and 2 DuoNeb's prior to arrival with little improvement.   HPI     Home Medications Prior to Admission medications   Medication Sig Start Date End Date Taking? Authorizing Provider  acetaminophen (TYLENOL) 500 MG tablet Take 1,000 mg by mouth every 8 (eight) hours as needed for moderate pain.    [provider]  aspirin EC 81 MG tablet Take 81 mg by mouth in the morning and at bedtime. Swallow whole.    [provider]  Calcium-Vitamin D (CALTRATE 600 PLUS-VIT D PO) Take 1 tablet by mouth in the morning and at bedtime. 600 mg-20 mcg (800 unit)    [provider]  clopidogrel (PLAVIX) 75 MG tablet Take 75 mg by mouth daily. 09/30/23   [provider]  hydrochlorothiazide (HYDRODIURIL) 12.5 MG tablet Take 1 tablet (12.5 mg total) by mouth daily. 11/05/22   Rhetta Mura, MD  Ipratropium-Albuterol (COMBIVENT RESPIMAT) 20-100 MCG/ACT AERS respimat Inhale 1 puff into the lungs in the morning and at bedtime.    [provider]  lactulose (CHRONULAC) 10 GM/15ML solution Take 13.3 g by mouth 2 (two) times daily.    [provider]  Multiple Vitamins-Minerals (MULTIVITAMIN WITH MINERALS) tablet Take 1 tablet by mouth at bedtime.    [provider]  pantoprazole (PROTONIX) 40 MG tablet Take 1 tablet (40 mg total) by mouth daily. 11/05/22   Rhetta Mura, MD  polyethylene glycol powder  (MIRALAX) 17 GM/SCOOP powder Take 17 g by mouth daily. 07/17/23   Rondel Baton, MD  Selenium Sulfide 2.25 % SHAM Apply 20 mLs topically See admin instructions. Apply 20 ml to scalp on Monday, Wednesday, and Friday. Leave on scalp for 5 minutes before rinsing    [provider]  senna (SENOKOT) 8.6 MG TABS tablet Take 8.6 mg by mouth 2 (two) times daily as needed (bowel aid).    [provider]      Allergies    Patient has no known allergies.    Review of Systems   Review of Systems  Physical Exam Updated Vital Signs BP (!) 191/77 (BP Location: Left Arm)   Pulse 70   Temp 98.2 F (36.8 C) (Oral)   Resp 19   SpO2 (!) 88%  Physical Exam Vitals and nursing note reviewed.  Constitutional:      General: She is in acute distress.     Appearance: She is well-developed. She is ill-appearing. She is not diaphoretic.  HENT:     Head: Normocephalic and atraumatic.     Right Ear: External ear normal.     Left Ear: External ear normal.     Nose: Nose normal.     Mouth/Throat:     Mouth: Mucous membranes are moist.  Eyes:     General: No scleral icterus.    Conjunctiva/sclera: Conjunctivae normal.  Cardiovascular:     Rate and Rhythm: Normal rate and regular rhythm.  Heart sounds: Normal heart sounds. No murmur heard.    No friction rub. No gallop.  Pulmonary:     Effort: Prolonged expiration and respiratory distress present.     Breath sounds: Normal breath sounds.     Comments: Very poor air movement, diffuse inspiratory and expiratory rhonchi and wheezing.  Patient appears exhausted.  She is moving air very poorly.  Currently on nonrebreather. Abdominal:     General: Bowel sounds are normal. There is no distension.     Palpations: Abdomen is soft. There is no mass.     Tenderness: There is no abdominal tenderness. There is no guarding.  Musculoskeletal:     Cervical back: Normal range of motion.  Skin:    General: Skin is warm and dry.  Neurological:      Mental Status: She is alert and oriented to person, place, and time.  Psychiatric:        Behavior: Behavior normal.     ED Results / Procedures / Treatments   Labs (all labs ordered are listed, but only abnormal results are displayed) Labs Reviewed  RESP PANEL BY RT-PCR (RSV, FLU A&B, COVID)  [id number]  BRAIN NATRIURETIC PEPTIDE  I-STAT CHEM 8, ED  TROPONIN I (HIGH SENSITIVITY)    EKG None  Radiology No results found.  Procedures .Critical Care  Performed by: Arthor Captain, PA-C Authorized by: Arthor Captain, PA-C   Critical care provider statement:    Critical care time (minutes):  65   Critical care time was exclusive of:  Separately billable procedures and treating other patients   Critical care was necessary to treat or prevent imminent or life-threatening deterioration of the following conditions:  Respiratory failure   Critical care was time spent personally by me on the following activities:  Development of treatment plan with patient or surrogate, discussions with consultants, evaluation of patient's response to treatment, examination of patient, ordering and review of laboratory studies, ordering and review of radiographic studies, ordering and performing treatments and interventions, pulse oximetry, re-evaluation of patient's condition, review of old charts, interpretation of cardiac output measurements and obtaining history from patient or surrogate     Medications Ordered in ED Medications  ipratropium-albuterol (DUONEB) 0.5-2.5 (3) MG/3ML nebulizer solution 3 mL (has no administration in time range)    ED Course/ Medical Decision Making/ A&P Clinical Course as of 11/27/23 2236  Wynelle Link Nov 27, 2023  1904 Np- Angelique  Called me to give more history from [facility].   She states that the patient had onset of worsening symptoms beginning on Friday with cough and wheezing and new oxygen requirement went.  At the facility she was placed on 2 L.   patient had a  respiratory panel which is still pending, negative chest x-ray and   Was given prednisone, Doxy's clean, and had a negative chest x-ray. Angelique   Reports that the nursing staff called today because she had slowly increasing oxygen needs and was now on 3 L with a mask due to mouth breathing.  They also reports that she was very somnolent and difficult and had worsening difficulty breathing so EMS was called.  Patient does not normally require oxygen.  [AH]  1906 3L  [AH]  2020 pH, Ven(!): 7.2 [AH]  2057 I had a long discussion with the patient's son updating him about her status.  He states that she is currently full code.  [AH]  2217 Blood gas, venous (at [facility])(!!) [AH]  2217 I-Stat Chem 8,  ED(!) [AH]  2217 Brain natriuretic peptide(!) [AH]  2218 Respiratory Syncytial Virus by PCR(!): POSITIVE [AH]  2218 Blood gas, arterial (at [facility])(!!) Patient's blood blood gas has significantly improved on BiPAP. [AH]    Clinical Course User Index [AH] Arthor Captain, PA-C                                 Medical Decision Making This patient presents to the ED for concern of  , this involves an extensive number of treatment options, and is a complaint that carries with it a high risk of complications and morbidity.  The emergent differential diagnosis for shortness of breath includes, but is not limited to, Pulmonary edema, bronchoconstriction, Pneumonia, Pulmonary embolism, Pneumotherax/ Hemothorax, Dysrythmia, ACS.     Co morbidities:       has a past medical history of Basal cell carcinoma of ala nasi, Cataract, COVID-19 virus infection (05/22/2021), Essential hypertension, benign (06/05/2013), Hyperglycemia (05/22/2021), Hypertension, Knee pain, bilateral, Measles, Myalgia, Osteoporosis (08/12/2014), Seborrheic dermatitis of scalp, Small bowel obstruction (HCC), Tobacco abuse (02/24/2011), and Varicella.   Social Determinants of Health:        SDOH Screenings Food Insecurity: No Food  Insecurity (10/26/2022) Housing: Low Risk  (10/26/2022) Transportation Needs: No Transportation Needs (10/26/2022) Utilities: Not At Risk (10/26/2022) Tobacco Use: High Risk (11/27/2023)   Additional history:  {Additional history obtained from emr, son, NP at [facility] {External records from outside source obtained and reviewed including MAR  Lab Tests:  I Ordered, and personally interpreted labs.  The pertinent results include:    As per ED Course   Imaging Studies:  I ordered imaging studies including cxr I independently visualized and interpreted imaging which showed No acute fingdings I agree with the radiologist interpretation  Cardiac Monitoring/ECG:       The patient was maintained on a cardiac monitor.  I personally viewed and interpreted the cardiac monitored which showed an underlying rhythm of: Sinus rhythm  Medicines ordered and prescription drug management:  I ordered medication including Medications ipratropium-albuterol (DUONEB) 0.5-2.5 (3) MG/3ML nebulizer solution 3 mL (3 mLs Nebulization Given 11/27/23 1836) for respiratory failure Reevaluation of the patient after these medicines showed that the patient improved I have reviewed the patients home medicines and have made adjustments as needed  Test Considered:       CT angio however this does not appear to be acute pulmonary embolus  Critical Interventions:       BiPAP  Consultations Obtained: Dr. Irena Cords  Problem List / ED Course:       (B33.8) RSV infection  (primary encounter diagnosis)  (J96.01,  J96.02) Acute respiratory failure with hypoxia and hypercapnia (HCC)   MDM: Patient here for respiratory distress.  She is RSV in the setting of chronic COPD.  The patient in respiratory failure with acute mixed hypercapnic and hypoxic respiratory failure improving on BiPAP and will need admission.   Dispostion:  After consideration of the diagnostic results and the patients response to  treatment, I feel that the patent would benefit from admission.    Amount and/or Complexity of Data Reviewed Labs: ordered. Decision-making details documented in ED Course. Radiology: ordered and independent interpretation performed.  Risk Prescription drug management. Decision regarding hospitalization.           Final Clinical Impression(s) / ED Diagnoses Final diagnoses:  RSV infection  Acute respiratory failure with hypoxia and hypercapnia (HCC)  Rx / DC Orders ED Discharge Orders     None         Arthor Captain, PA-C 11/28/23 1553    Alvira Monday, MD 12/03/23 2250

## 2023-11-28 DIAGNOSIS — L89892 Pressure ulcer of other site, stage 2: Secondary | ICD-10-CM | POA: Diagnosis not present

## 2023-11-28 DIAGNOSIS — R0603 Acute respiratory distress: Secondary | ICD-10-CM | POA: Diagnosis present

## 2023-11-28 DIAGNOSIS — I739 Peripheral vascular disease, unspecified: Secondary | ICD-10-CM | POA: Diagnosis present

## 2023-11-28 DIAGNOSIS — Z1152 Encounter for screening for COVID-19: Secondary | ICD-10-CM | POA: Diagnosis not present

## 2023-11-28 DIAGNOSIS — Z781 Physical restraint status: Secondary | ICD-10-CM | POA: Diagnosis not present

## 2023-11-28 DIAGNOSIS — M810 Age-related osteoporosis without current pathological fracture: Secondary | ICD-10-CM | POA: Diagnosis present

## 2023-11-28 DIAGNOSIS — Z66 Do not resuscitate: Secondary | ICD-10-CM | POA: Diagnosis not present

## 2023-11-28 DIAGNOSIS — F259 Schizoaffective disorder, unspecified: Secondary | ICD-10-CM | POA: Diagnosis present

## 2023-11-28 DIAGNOSIS — E876 Hypokalemia: Secondary | ICD-10-CM | POA: Diagnosis not present

## 2023-11-28 DIAGNOSIS — J9602 Acute respiratory failure with hypercapnia: Secondary | ICD-10-CM | POA: Diagnosis present

## 2023-11-28 DIAGNOSIS — Z7189 Other specified counseling: Secondary | ICD-10-CM | POA: Diagnosis not present

## 2023-11-28 DIAGNOSIS — R0609 Other forms of dyspnea: Secondary | ICD-10-CM | POA: Diagnosis not present

## 2023-11-28 DIAGNOSIS — J441 Chronic obstructive pulmonary disease with (acute) exacerbation: Secondary | ICD-10-CM | POA: Diagnosis present

## 2023-11-28 DIAGNOSIS — J121 Respiratory syncytial virus pneumonia: Secondary | ICD-10-CM | POA: Diagnosis present

## 2023-11-28 DIAGNOSIS — J440 Chronic obstructive pulmonary disease with acute lower respiratory infection: Secondary | ICD-10-CM | POA: Diagnosis present

## 2023-11-28 DIAGNOSIS — J210 Acute bronchiolitis due to respiratory syncytial virus: Secondary | ICD-10-CM | POA: Diagnosis present

## 2023-11-28 DIAGNOSIS — F0390 Unspecified dementia without behavioral disturbance: Secondary | ICD-10-CM | POA: Diagnosis present

## 2023-11-28 DIAGNOSIS — G9341 Metabolic encephalopathy: Secondary | ICD-10-CM | POA: Diagnosis present

## 2023-11-28 DIAGNOSIS — R739 Hyperglycemia, unspecified: Secondary | ICD-10-CM | POA: Diagnosis present

## 2023-11-28 DIAGNOSIS — B338 Other specified viral diseases: Secondary | ICD-10-CM | POA: Diagnosis not present

## 2023-11-28 DIAGNOSIS — E440 Moderate protein-calorie malnutrition: Secondary | ICD-10-CM | POA: Diagnosis present

## 2023-11-28 DIAGNOSIS — Z515 Encounter for palliative care: Secondary | ICD-10-CM | POA: Diagnosis not present

## 2023-11-28 DIAGNOSIS — K219 Gastro-esophageal reflux disease without esophagitis: Secondary | ICD-10-CM | POA: Diagnosis present

## 2023-11-28 DIAGNOSIS — J9601 Acute respiratory failure with hypoxia: Secondary | ICD-10-CM | POA: Diagnosis present

## 2023-11-28 DIAGNOSIS — Z6820 Body mass index (BMI) 20.0-20.9, adult: Secondary | ICD-10-CM | POA: Diagnosis not present

## 2023-11-28 DIAGNOSIS — I10 Essential (primary) hypertension: Secondary | ICD-10-CM | POA: Diagnosis present

## 2023-11-28 DIAGNOSIS — Z8616 Personal history of COVID-19: Secondary | ICD-10-CM | POA: Diagnosis not present

## 2023-11-28 LAB — BLOOD GAS, ARTERIAL
Acid-Base Excess: 15.6 mmol/L — ABNORMAL HIGH (ref 0.0–2.0)
Bicarbonate: 43.4 mmol/L — ABNORMAL HIGH (ref 20.0–28.0)
Delivery systems: POSITIVE
Drawn by: 59133
FIO2: 40 %
Mode: POSITIVE
O2 Saturation: 97.1 %
Patient temperature: 36.6
pCO2 arterial: 69 mm[Hg] (ref 32–48)
pH, Arterial: 7.41 (ref 7.35–7.45)
pO2, Arterial: 70 mm[Hg] — ABNORMAL LOW (ref 83–108)

## 2023-11-28 LAB — BLOOD GAS, VENOUS
Acid-Base Excess: 14.9 mmol/L — ABNORMAL HIGH (ref 0.0–2.0)
Bicarbonate: 44.8 mmol/L — ABNORMAL HIGH (ref 20.0–28.0)
O2 Saturation: 79 %
Patient temperature: 37
pCO2, Ven: 83 mm[Hg] (ref 44–60)
pH, Ven: 7.34 (ref 7.25–7.43)
pO2, Ven: 44 mm[Hg] (ref 32–45)

## 2023-11-28 LAB — STREP PNEUMONIAE URINARY ANTIGEN: Strep Pneumo Urinary Antigen: NEGATIVE

## 2023-11-28 LAB — PROCALCITONIN: Procalcitonin: <0.1 ng/mL

## 2023-11-28 MED ORDER — MORPHINE SULFATE (PF) 2 MG/ML IV SOLN: 2.0000 mg | INTRAVENOUS | Status: DC | PRN

## 2023-11-28 MED ORDER — NICOTINE 14 MG/24HR TD PT24
14.0000 mg | MEDICATED_PATCH | Freq: Every day | TRANSDERMAL | Status: DC
Start: 2023-11-28 — End: 2023-12-08
  Administered 2023-11-29 – 2023-12-08 (×10): 14 mg via TRANSDERMAL
  Filled 2023-11-28 (×10): qty 1

## 2023-11-28 MED ORDER — ASPIRIN 81 MG PO TBEC
81.0000 mg | DELAYED_RELEASE_TABLET | Freq: Every day | ORAL | Status: DC
  Administered 2023-11-28 – 2023-12-07 (×10): 81 mg via ORAL
  Filled 2023-11-28 (×10): qty 1

## 2023-11-28 MED ORDER — IPRATROPIUM-ALBUTEROL 0.5-2.5 (3) MG/3ML IN SOLN
3.0000 mL | Freq: Three times a day (TID) | RESPIRATORY_TRACT | Status: DC | PRN
  Administered 2023-11-28: 3 mL via RESPIRATORY_TRACT
  Filled 2023-11-28: qty 3

## 2023-11-28 MED ORDER — PANTOPRAZOLE SODIUM 40 MG IV SOLR
40.0000 mg | INTRAVENOUS | Status: DC
  Administered 2023-11-28 – 2023-11-30 (×3): 40 mg via INTRAVENOUS
  Filled 2023-11-28 (×3): qty 10

## 2023-11-28 MED ORDER — DOCUSATE SODIUM 100 MG PO CAPS
100.0000 mg | ORAL_CAPSULE | Freq: Two times a day (BID) | ORAL | Status: DC
Start: 2023-11-28 — End: 2023-12-08
  Administered 2023-11-29 – 2023-12-08 (×16): 100 mg via ORAL
  Filled 2023-11-28 (×20): qty 1

## 2023-11-28 MED ORDER — AZITHROMYCIN 250 MG PO TABS
500.0000 mg | ORAL_TABLET | Freq: Every day | ORAL | Status: DC
  Administered 2023-11-29: 500 mg via ORAL
  Filled 2023-11-28 (×2): qty 2

## 2023-11-28 MED ORDER — SODIUM CHLORIDE 0.9 % IV SOLN
500.0000 mg | Freq: Once | INTRAVENOUS | Status: AC
  Administered 2023-11-28: 500 mg via INTRAVENOUS
  Filled 2023-11-28: qty 5

## 2023-11-28 MED ORDER — CLOPIDOGREL BISULFATE 75 MG PO TABS
75.0000 mg | ORAL_TABLET | Freq: Every day | ORAL | Status: DC
  Administered 2023-11-29 – 2023-12-08 (×9): 75 mg via ORAL
  Filled 2023-11-28 (×11): qty 1

## 2023-11-28 MED ORDER — HEPARIN SODIUM (PORCINE) 5000 UNIT/ML IJ SOLN
5000.0000 [IU] | Freq: Two times a day (BID) | INTRAMUSCULAR | Status: DC
  Administered 2023-11-28 – 2023-12-08 (×21): 5000 [IU] via SUBCUTANEOUS
  Filled 2023-11-28 (×21): qty 1

## 2023-11-28 MED ORDER — CHLORHEXIDINE GLUCONATE CLOTH 2 % EX PADS
6.0000 | MEDICATED_PAD | Freq: Every day | CUTANEOUS | Status: DC
  Administered 2023-11-28 – 2023-12-05 (×8): 6 via TOPICAL

## 2023-11-28 MED ORDER — SODIUM CHLORIDE 0.9 % IV SOLN
2.0000 g | INTRAVENOUS | Status: DC
  Administered 2023-11-28 – 2023-11-30 (×3): 2 g via INTRAVENOUS
  Filled 2023-11-28 (×3): qty 20

## 2023-11-28 MED ORDER — GUAIFENESIN ER 600 MG PO TB12: 600.0000 mg | ORAL_TABLET | Freq: Two times a day (BID) | ORAL | Status: DC | PRN

## 2023-11-28 MED ORDER — ACETAMINOPHEN 325 MG PO TABS: 650.0000 mg | ORAL_TABLET | Freq: Four times a day (QID) | ORAL | Status: DC | PRN

## 2023-11-28 MED ORDER — HYDROCODONE-ACETAMINOPHEN 5-325 MG PO TABS
1.0000 | ORAL_TABLET | ORAL | Status: DC | PRN
Start: 2023-11-27 — End: 2023-12-08
  Administered 2023-11-28 – 2023-12-06 (×3): 1 via ORAL
  Filled 2023-11-28: qty 2
  Filled 2023-11-28 (×3): qty 1

## 2023-11-28 MED ORDER — HYDRALAZINE HCL 20 MG/ML IJ SOLN
5.0000 mg | Freq: Four times a day (QID) | INTRAMUSCULAR | Status: DC | PRN
  Administered 2023-11-28: 5 mg via INTRAVENOUS
  Filled 2023-11-28 (×2): qty 1

## 2023-11-28 MED ORDER — SODIUM CHLORIDE 0.9% FLUSH
3.0000 mL | Freq: Two times a day (BID) | INTRAVENOUS | Status: DC
  Administered 2023-11-28 – 2023-12-05 (×16): 3 mL via INTRAVENOUS

## 2023-11-28 MED ORDER — IPRATROPIUM-ALBUTEROL 20-100 MCG/ACT IN AERS: 1.0000 | INHALATION_SPRAY | Freq: Three times a day (TID) | RESPIRATORY_TRACT | Status: DC | PRN

## 2023-11-28 MED ORDER — HYDROCHLOROTHIAZIDE 12.5 MG PO TABS
12.5000 mg | ORAL_TABLET | Freq: Every day | ORAL | Status: DC
  Administered 2023-11-29: 12.5 mg via ORAL
  Filled 2023-11-28: qty 1

## 2023-11-28 MED ORDER — ACETAMINOPHEN 650 MG RE SUPP: 650.0000 mg | Freq: Four times a day (QID) | RECTAL | Status: DC | PRN

## 2023-11-28 MED ORDER — ACETAMINOPHEN 500 MG PO TABS: 1000.0000 mg | ORAL_TABLET | Freq: Three times a day (TID) | ORAL | Status: DC | PRN

## 2023-11-28 MED ORDER — MORPHINE SULFATE (PF) 2 MG/ML IV SOLN
2.0000 mg | Freq: Four times a day (QID) | INTRAVENOUS | Status: DC | PRN
  Administered 2023-11-29 – 2023-11-30 (×2): 2 mg via INTRAVENOUS
  Filled 2023-11-28 (×2): qty 1

## 2023-11-28 MED ORDER — ORAL CARE MOUTH RINSE
15.0000 mL | OROMUCOSAL | Status: DC
  Administered 2023-11-28 – 2023-12-08 (×38): 15 mL via OROMUCOSAL

## 2023-11-28 MED ORDER — ORAL CARE MOUTH RINSE: 15.0000 mL | OROMUCOSAL | Status: DC | PRN

## 2023-11-28 NOTE — Progress Notes (Signed)
Removed PT from BiPAP and placed on 4 LPM. Current Sp02 95% (MD goal >=92%). RN aware is aware PT is a retainer. BiPAP has been ordered PRN and remains at bedside.

## 2023-11-28 NOTE — ED Notes (Signed)
Called ICU, spoke with Trey Paula. Advised patient will be coming up shortly. Agreed.

## 2023-11-28 NOTE — Progress Notes (Signed)
RN called to have RT check on the bipap.I came to the ED the patients tubing had disconnected from the mask. Pts O2 saturations were at 85%. Reconnected bipap placed patient on 100% for a few minutes. Saturations quickly recovered. Pt had abg drawn this morning. Results were compensated. Once patients sats recovered I removed the bipap and placed her on 4L nasal cannula. O2 saturations were 93% with a respiratory of 16. Pt is not in distress. Notified RN of change and left bipap on standby.

## 2023-11-28 NOTE — Assessment & Plan Note (Addendum)
Will continue with as needed Mucinex or Tussionex.  Antibiotic regimen with Rocephin and azithromycin.

## 2023-11-28 NOTE — Progress Notes (Signed)
PROGRESS NOTE  Brenda Gibson BJY:[id number] DOB: 1941/01/20 DOA: 11/27/2023 PCP: Pcp, No  Brief History   The patient is a 83 yr old woman who presented to [facility] ED on 11/27/2023 with complaints of worsening shortness of breath, cough, and wheezing since Friday.   In the ED the patient was found to be saturating at 88% on room air with AMS. She required BIPAP with 40% FIO2. She was also tachypneic. She was found to have hypercapneic and hypoxic respiratory failure with increased work of breathing. Procalcitonin was negative. She has tested positive for RSV.  She is admitted to the ICU for NIPV support. She remains on BIPAP today.  A & P  Acute respiratory failure with hypoxia and hypercapnia (HCC) Patient presenting with acute respiratory failure hypoxia hypercapnia and respiratory distress. Due to RSV Chest x-ray today shows patchy opacities in the right costophrenic angle. Procalcitonin is negative, but patient is started on empiric antibiotics to cover for possible super infection. Will continue patient on BiPAP. Low threshold for CTA chest. Echocardiogram done to 3 years ago showed ejection fraction with diastolic dysfunction.  After clinical evaluation of the patient will consider 2D echocardiogram as well.  Troponin currently is normal and patient does have elevated BNP.  Tobacco abuse Counseling once patient is stable we will offer nicotine patch in the meantime.  Essential hypertension, benign       Vitals:    11/27/23 1812 11/27/23 1845 11/27/23 2015 11/27/23 2336  BP: (!) 191/77 (!) 143/77 (!) 151/63 (!) 122/56  Home regimen consists of HCTZ 12.5. Will continue the same, monitor for electrolytes and kidney function.   COPD exacerbation (HCC) Continue patient on COPD exacerbation protocol with steroids DuoNebs and as needed MDI.  Respiratory distress As above, the initial event is unclear patient is not able to give complete history she is oriented but sleepy is on BiPAP.   ABG in AM.  RSV (respiratory syncytial virus pneumonia) Will continue with as needed Mucinex or Tussionex.  Antibiotic regimen with Rocephin and azithromycin.   DVT prophylaxis:  Heparin  Consults:  None  Advance Care Planning:    Code Status: Full Code    Family Communication:  None  Disposition Plan:  SNF  I have seen and examined this patient myself. I have spent 32 minutes in his evaluation and care.   Consepcion Utt, DO [facility] Direct contact: see [email or web address]  7PM-7AM contact night coverage as above 11/28/2023, 6:25 PM  LOS: 0 days   Consultants  None  Procedures  None  Antibiotics   Anti-infectives (From admission, onward)    Start     Dose/Rate Route Frequency Ordered Stop   11/28/23 0330  azithromycin (ZITHROMAX) 500 mg in sodium chloride 0.9 % 250 mL IVPB        500 mg 250 mL/hr over 60 Minutes Intravenous  Once 11/28/23 0324 11/28/23 0705   11/28/23 0115  cefTRIAXone (ROCEPHIN) 2 g in sodium chloride 0.9 % 100 mL IVPB        2 g 200 mL/hr over 30 Minutes Intravenous Every 24 hours 11/28/23 0107 12/03/23 0114   11/28/23 0115  azithromycin (ZITHROMAX) tablet 500 mg        500 mg Oral Daily 11/28/23 0107          Interval History/Subjective  The patient is awake and alert while on BIPAP. She states that she is feeling better.  Objective   Vitals:  Vitals:   11/28/23 1700 11/28/23 1800  BP: Marland Kitchen)  135/57 (!) 142/53  Pulse:  66  Resp: 18 17  Temp:    SpO2:  96%    Exam:  Constitutional:  The patient is awake, alert, and oriented x 3. No acute distress. Respiratory:  No increased work of breathing. No wheezes, rales, or rhonchi No tactile fremitus Cardiovascular:  Regular rate and rhythm No murmurs, ectopy, or gallups. No lateral PMI. No thrills. Abdomen:  Abdomen is soft, non-tender, non-distended No hernias, masses, or organomegaly Normoactive bowel sounds.  Musculoskeletal:  No cyanosis, clubbing, or edema Skin:  No  rashes, lesions, ulcers palpation of skin: no induration or nodules Neurologic:  CN 2-12 intact Sensation all 4 extremities intact Psychiatric:  Mental status Mood, affect appropriate Orientation to person, place, time  judgment and insight appear intact   I have personally reviewed the following:   Today's Data   Vitals:   11/28/23 1700 11/28/23 1800  BP: (!) 135/57 (!) 142/53  Pulse:  66  Resp: 18 17  Temp:    SpO2:  96%     Lab Data  CBC    Component Value Date/Time   WBC 10.8 (H) 07/17/2023 0453   RBC 5.43 (H) 07/17/2023 0453   HGB 15.0 11/27/2023 1855   HCT 44.0 11/27/2023 1855   PLT 338 07/17/2023 0453   MCV 72.4 (L) 07/17/2023 0453   MCH 20.6 (L) 07/17/2023 0453   MCHC 28.5 (L) 07/17/2023 0453   RDW 18.0 (H) 07/17/2023 0453   LYMPHSABS 1.5 07/17/2023 0453   MONOABS 0.5 07/17/2023 0453   EOSABS 0.0 07/17/2023 0453   BASOSABS 0.1 07/17/2023 0453      Latest Ref Rng & Units 11/27/2023    6:55 PM 07/17/2023    4:53 AM 11/07/2022    4:07 AM  BMP  Glucose 70 - 99 mg/dL 130  865  784   BUN 8 - 23 mg/dL 29  30  18    Creatinine 0.44 - 1.00 mg/dL 6.96  2.95  2.84   Sodium 135 - 145 mmol/L 134  136  136   Potassium 3.5 - 5.1 mmol/L 4.0  4.3  4.4   Chloride 98 - 111 mmol/L 93  96  94   CO2 22 - 32 mmol/L  31  35   Calcium 8.9 - 10.3 mg/dL  9.9  8.6      Micro Data   Results for orders placed or performed during the hospital encounter of 11/27/23  Resp panel by RT-PCR (RSV, Flu A&B, Covid) Anterior Nasal Swab     Status: Abnormal   Collection Time: 11/27/23  6:20 PM   Specimen: Anterior Nasal Swab  Result Value Ref Range Status   SARS Coronavirus 2 by RT PCR NEGATIVE NEGATIVE Final    Comment: (NOTE) SARS-CoV-2 target nucleic acids are NOT DETECTED.  The SARS-CoV-2 RNA is generally detectable in upper respiratory specimens during the acute phase of infection. The lowest concentration of SARS-CoV-2 viral copies this assay can detect is 138 copies/mL. A  negative result does not preclude SARS-Cov-2 infection and should not be used as the sole basis for treatment or other patient management decisions. A negative result may occur with  improper specimen collection/handling, submission of specimen other than nasopharyngeal swab, presence of viral mutation(s) within the areas targeted by this assay, and inadequate number of viral copies(<138 copies/mL). A negative result must be combined with clinical observations, patient history, and epidemiological information. The expected result is Negative.  Fact Sheet for Patients:  BloggerCourse.com  Fact Sheet  for Healthcare Providers:  SeriousBroker.it  This test is no t yet approved or cleared by the [place] and  has been authorized for detection and/or diagnosis of SARS-CoV-2 by FDA under an Emergency Use Authorization (EUA). This EUA will remain  in effect (meaning this test can be used) for the duration of the COVID-19 declaration under Section 564(b)(1) of the Act, 21 U.S.C.section 360bbb-3(b)(1), unless the authorization is terminated  or revoked sooner.       Influenza A by PCR NEGATIVE NEGATIVE Final   Influenza B by PCR NEGATIVE NEGATIVE Final    Comment: (NOTE) The Xpert Xpress SARS-CoV-2/FLU/RSV plus assay is intended as an aid in the diagnosis of influenza from Nasopharyngeal swab specimens and should not be used as a sole basis for treatment. Nasal washings and aspirates are unacceptable for Xpert Xpress SARS-CoV-2/FLU/RSV testing.  Fact Sheet for Patients: BloggerCourse.com  Fact Sheet for Healthcare Providers: SeriousBroker.it  This test is not yet approved or cleared by the [place] FDA and has been authorized for detection and/or diagnosis of SARS-CoV-2 by FDA under an Emergency Use Authorization (EUA). This EUA will remain in effect (meaning this test can  be used) for the duration of the COVID-19 declaration under Section 564(b)(1) of the Act, 21 U.S.C. section 360bbb-3(b)(1), unless the authorization is terminated or revoked.     Resp Syncytial Virus by PCR POSITIVE (A) NEGATIVE Final    Comment: (NOTE) Fact Sheet for Patients: BloggerCourse.com  Fact Sheet for Healthcare Providers: SeriousBroker.it  This test is not yet approved or cleared by the [place] FDA and has been authorized for detection and/or diagnosis of SARS-CoV-2 by FDA under an Emergency Use Authorization (EUA). This EUA will remain in effect (meaning this test can be used) for the duration of the COVID-19 declaration under Section 564(b)(1) of the Act, 21 U.S.C. section 360bbb-3(b)(1), unless the authorization is terminated or revoked.  Performed at [facility] [address], [place], [place] [address]      Imaging  CXR   Scheduled Meds:  aspirin EC  81 mg Oral QHS   azithromycin  500 mg Oral Daily   Chlorhexidine Gluconate Cloth  6 each Topical Daily   clopidogrel  75 mg Oral Daily   docusate sodium  100 mg Oral BID   heparin  5,000 Units Subcutaneous Q12H   hydrochlorothiazide  12.5 mg Oral Daily   nicotine  14 mg Transdermal Daily   mouth rinse  15 mL Mouth Rinse 4 times per day   pantoprazole (PROTONIX) IV  40 mg Intravenous Q24H   sodium chloride flush  3 mL Intravenous Q12H   Continuous Infusions:  cefTRIAXone (ROCEPHIN)  IV Stopped (11/28/23 0407)    Principal Problem:   Acute respiratory failure with hypoxia and hypercapnia (HCC) Active Problems:   Tobacco abuse   Essential hypertension, benign   COPD exacerbation (HCC)   Respiratory distress   RSV (respiratory syncytial virus pneumonia)   LOS: 0 days

## 2023-11-28 NOTE — ED Notes (Signed)
Called respiratory therapist to advise pt is coming upstairs

## 2023-11-28 NOTE — Progress Notes (Signed)
   11/28/23 1631  BiPAP/CPAP/SIPAP  BiPAP/CPAP/SIPAP Pt Type Adult  BiPAP/CPAP/SIPAP V60  Mask Type Full face mask  Mask Size Medium  Set Rate 16 breaths/min  Respiratory Rate 18 breaths/min  IPAP 16 cmH20  EPAP 8 cmH2O  FiO2 (%) 40 %  Minute Ventilation 8.6  Leak 2  Peak Inspiratory Pressure (PIP) 16  Tidal Volume (Vt) 480  Patient Home Equipment No  Auto Titrate No  Nasal massage performed Yes  BiPAP/CPAP /SiPAP Vitals  BP 128/60  Bilateral Breath Sounds Diminished;Clear

## 2023-11-28 NOTE — Progress Notes (Signed)
   11/28/23 1210  BiPAP/CPAP/SIPAP  BiPAP/CPAP/SIPAP Pt Type Adult  BiPAP/CPAP/SIPAP V60  Mask Type Full face mask  Mask Size Medium  Set Rate 16 breaths/min  Respiratory Rate 20 breaths/min  IPAP (S)  16 cmH20 (for better VT)  EPAP (S)  8 cmH2O (for better sp02)  FiO2 (%) (S)  40 % (decreased to 35%- rn aware)  Minute Ventilation 6.4  Leak 4  Peak Inspiratory Pressure (PIP) 16  Tidal Volume (Vt) 372  Patient Home Equipment No  Auto Titrate No  Press High Alarm 25 cmH2O  Press Low Alarm 5 cmH2O  Nasal massage performed Yes  CPAP/SIPAP surface wiped down Yes  BiPAP/CPAP /SiPAP Vitals  Bilateral Breath Sounds Diminished;Clear

## 2023-11-28 NOTE — Assessment & Plan Note (Addendum)
As above, the initial event is unclear patient is not able to give complete history she is oriented but sleepy is on BiPAP.  ABG in AM.

## 2023-11-28 NOTE — Progress Notes (Signed)
   11/28/23 2321  BiPAP/CPAP/SIPAP  BiPAP/CPAP/SIPAP Pt Type Adult  BiPAP/CPAP/SIPAP V60  Mask Type Full face mask  Mask Size Medium  Set Rate 16 breaths/min  Respiratory Rate 19 breaths/min  IPAP 16 cmH20  EPAP 8 cmH2O  FiO2 (%) 40 %  Minute Ventilation 7.7  Leak 23  Peak Inspiratory Pressure (PIP) 16  Tidal Volume (Vt) 450  Patient Home Equipment No  Auto Titrate No  Press High Alarm 25 cmH2O  Press Low Alarm 5 cmH2O  Oxygen Percent 40 %  BiPAP/CPAP /SiPAP Vitals  Bilateral Breath Sounds Clear;Diminished

## 2023-11-28 NOTE — Progress Notes (Signed)
Initial Nutrition Assessment  DOCUMENTATION CODES:   Not applicable  INTERVENTION:  - NPO per MD. - Recommend Magic cup TID with meals once diet advanced. Each supplement provides 290 kcal and 9 grams of protein   NUTRITION DIAGNOSIS:   Inadequate oral intake related to inability to eat as evidenced by NPO status.  GOAL:   Patient will meet greater than or equal to 90% of their needs   MONITOR:   Diet advancement, Weight trends  REASON FOR ASSESSMENT:   Consult Assessment of nutrition requirement/status  ASSESSMENT:   83 y.o. female with PMH of essential HTN, COPD, osteoporosis, history of small bowel obstruction who presented via EMS from skilled nursing facility with progressive worsening of shortness of breath, cough, wheezing.  RD working remotely. Patient noted to be on BiPAP at this time, unable to obtain nutrition history.   Per EMR, weight history over the past year as below: 07/17/23: 140# 10/21/23: 140# 11/27/23: 140# 11/28/23: 121#  As all weights are exactly the same, question if they were copied over. Weight today much lower, which could suggest possible weight loss at some point during the past year.   Patient has been assessed by this RD before, in December 2023. At that time her son reported she ate very well at home and didn't like nutrition supplements. During that admission she had been on [employer] and reported enjoying them.  Patient currently NPO. Recommend Magic Cup once diet advanced.    Medications reviewed and include: Colace  Labs reviewed:  No BMP today   NUTRITION - FOCUSED PHYSICAL EXAM:  Unable to complete, RD working remotely  Diet Order:   Diet Order             Diet NPO time specified Except for: Sips with Meds  Diet effective now                   EDUCATION NEEDS:  Not appropriate for education at this time  Skin:  Skin Assessment: Reviewed RN Assessment  Last BM:  1/26  Height:  Ht Readings from Last 1  Encounters:  11/28/23 5\' 5"  (1.651 m)   Weight:  Wt Readings from Last 1 Encounters:  11/28/23 55.1 kg    BMI:  Body mass index is 20.21 kg/m.  Estimated Nutritional Needs:  Kcal:  1550-1750 kcals Protein:  70-80 grams Fluid:  >/= 1.6L    Shelle Iron RD, LDN Contact via Secure Chat.

## 2023-11-28 NOTE — Progress Notes (Signed)
Placed pt back on bipap pt was only 90% O2 saturations with 6L [place]. Pt was in no distress.

## 2023-11-28 NOTE — Progress Notes (Signed)
OT Cancellation Note  Patient Details Name: JISSELLE POTH MRN: [id number] DOB: [birth year]   Cancelled Treatment:    Reason Eval/Treat Not Completed: Medical issues which prohibited therapy Patient is on BiPAP with recent admission to SDU. OT to continue to follow and check back on 1/28.  Rosalio Loud, MS Acute Rehabilitation Department Office# [phone]  11/28/2023, 1:42 PM

## 2023-11-29 ENCOUNTER — Inpatient Hospital Stay (HOSPITAL_COMMUNITY): Payer: Medicare Other

## 2023-11-29 DIAGNOSIS — J121 Respiratory syncytial virus pneumonia: Secondary | ICD-10-CM

## 2023-11-29 DIAGNOSIS — J9601 Acute respiratory failure with hypoxia: Secondary | ICD-10-CM | POA: Diagnosis not present

## 2023-11-29 DIAGNOSIS — I10 Essential (primary) hypertension: Secondary | ICD-10-CM | POA: Diagnosis not present

## 2023-11-29 DIAGNOSIS — R0609 Other forms of dyspnea: Secondary | ICD-10-CM

## 2023-11-29 DIAGNOSIS — Z720 Tobacco use: Secondary | ICD-10-CM

## 2023-11-29 DIAGNOSIS — J441 Chronic obstructive pulmonary disease with (acute) exacerbation: Secondary | ICD-10-CM | POA: Diagnosis not present

## 2023-11-29 LAB — D-DIMER, QUANTITATIVE: D-Dimer, Quant: 0.5 ug{FEU}/mL (ref 0.00–0.50)

## 2023-11-29 LAB — CBC WITH DIFFERENTIAL/PLATELET
Abs Immature Granulocytes: 0.06 10*3/uL (ref 0.00–0.07)
Basophils Absolute: 0 10*3/uL (ref 0.0–0.1)
Basophils Relative: 0 %
Eosinophils Absolute: 0 10*3/uL (ref 0.0–0.5)
Eosinophils Relative: 0 %
HCT: 40.1 % (ref 36.0–46.0)
Hemoglobin: 11.5 g/dL — ABNORMAL LOW (ref 12.0–15.0)
Immature Granulocytes: 1 %
Lymphocytes Relative: 14 %
Lymphs Abs: 1.7 10*3/uL (ref 0.7–4.0)
MCH: 22.8 pg — ABNORMAL LOW (ref 26.0–34.0)
MCHC: 28.7 g/dL — ABNORMAL LOW (ref 30.0–36.0)
MCV: 79.4 fL — ABNORMAL LOW (ref 80.0–100.0)
Monocytes Absolute: 1 10*3/uL (ref 0.1–1.0)
Monocytes Relative: 8 %
Neutro Abs: 9.5 10*3/uL — ABNORMAL HIGH (ref 1.7–7.7)
Neutrophils Relative %: 77 %
Platelets: 293 10*3/uL (ref 150–400)
RBC: 5.05 MIL/uL (ref 3.87–5.11)
RDW: 21.9 % — ABNORMAL HIGH (ref 11.5–15.5)
WBC: 12.3 10*3/uL — ABNORMAL HIGH (ref 4.0–10.5)
nRBC: 0 % (ref 0.0–0.2)

## 2023-11-29 LAB — BASIC METABOLIC PANEL
Anion gap: 10 (ref 5–15)
BUN: 28 mg/dL — ABNORMAL HIGH (ref 8–23)
CO2: 34 mmol/L — ABNORMAL HIGH (ref 22–32)
Calcium: 8.8 mg/dL — ABNORMAL LOW (ref 8.9–10.3)
Chloride: 91 mmol/L — ABNORMAL LOW (ref 98–111)
Creatinine, Ser: 0.57 mg/dL (ref 0.44–1.00)
GFR, Estimated: >60 mL/min (ref >60)
Glucose, Bld: 97 mg/dL (ref 70–99)
Potassium: 3.3 mmol/L — ABNORMAL LOW (ref 3.5–5.1)
Sodium: 135 mmol/L (ref 135–145)

## 2023-11-29 LAB — ECHOCARDIOGRAM COMPLETE
Height: 65 in
S' Lateral: 2 cm
Weight: 1943.58 [oz_av]

## 2023-11-29 LAB — MAGNESIUM: Magnesium: 2.5 mg/dL — ABNORMAL HIGH (ref 1.7–2.4)

## 2023-11-29 MED ORDER — HYDRALAZINE HCL 25 MG PO TABS
25.0000 mg | ORAL_TABLET | Freq: Four times a day (QID) | ORAL | Status: DC | PRN
  Administered 2023-11-29 – 2023-12-02 (×4): 25 mg via ORAL
  Filled 2023-11-29 (×5): qty 1

## 2023-11-29 MED ORDER — BENZONATATE 100 MG PO CAPS
200.0000 mg | ORAL_CAPSULE | Freq: Two times a day (BID) | ORAL | Status: DC | PRN
  Administered 2023-11-29 – 2023-12-07 (×4): 200 mg via ORAL
  Filled 2023-11-29 (×4): qty 2

## 2023-11-29 MED ORDER — HALOPERIDOL LACTATE 5 MG/ML IJ SOLN
2.0000 mg | Freq: Four times a day (QID) | INTRAMUSCULAR | Status: DC | PRN
  Administered 2023-11-30 – 2023-12-02 (×5): 2 mg via INTRAVENOUS
  Filled 2023-11-29 (×7): qty 1

## 2023-11-29 MED ORDER — AMLODIPINE BESYLATE 5 MG PO TABS
2.5000 mg | ORAL_TABLET | Freq: Every day | ORAL | Status: DC
  Administered 2023-11-29 – 2023-12-08 (×9): 2.5 mg via ORAL
  Filled 2023-11-29 (×10): qty 1

## 2023-11-29 MED ORDER — SODIUM CHLORIDE 0.9% FLUSH
10.0000 mL | Freq: Two times a day (BID) | INTRAVENOUS | Status: DC
  Administered 2023-11-29 – 2023-11-30 (×2): 10 mL

## 2023-11-29 MED ORDER — IPRATROPIUM-ALBUTEROL 0.5-2.5 (3) MG/3ML IN SOLN
3.0000 mL | Freq: Four times a day (QID) | RESPIRATORY_TRACT | Status: DC | PRN
  Administered 2023-11-29 – 2023-12-07 (×2): 3 mL via RESPIRATORY_TRACT
  Filled 2023-11-29 (×2): qty 3

## 2023-11-29 MED ORDER — FUROSEMIDE 10 MG/ML IJ SOLN
20.0000 mg | Freq: Every day | INTRAMUSCULAR | Status: DC
  Administered 2023-11-29 – 2023-12-01 (×3): 20 mg via INTRAVENOUS
  Filled 2023-11-29 (×3): qty 2

## 2023-11-29 MED ORDER — HYDROXYZINE HCL 25 MG PO TABS
25.0000 mg | ORAL_TABLET | Freq: Three times a day (TID) | ORAL | Status: DC | PRN
  Administered 2023-11-29 – 2023-12-07 (×6): 25 mg via ORAL
  Filled 2023-11-29 (×7): qty 1

## 2023-11-29 MED ORDER — GUAIFENESIN ER 600 MG PO TB12
600.0000 mg | ORAL_TABLET | Freq: Two times a day (BID) | ORAL | Status: DC
  Administered 2023-11-29 – 2023-12-08 (×17): 600 mg via ORAL
  Filled 2023-11-29 (×19): qty 1

## 2023-11-29 MED ORDER — METHYLPREDNISOLONE SODIUM SUCC 125 MG IJ SOLR
60.0000 mg | INTRAMUSCULAR | Status: DC
  Administered 2023-11-29 – 2023-11-30 (×2): 60 mg via INTRAVENOUS
  Filled 2023-11-29 (×2): qty 2

## 2023-11-29 MED ORDER — POTASSIUM CHLORIDE CRYS ER 20 MEQ PO TBCR
40.0000 meq | EXTENDED_RELEASE_TABLET | Freq: Two times a day (BID) | ORAL | Status: AC
  Administered 2023-11-29 (×2): 40 meq via ORAL
  Filled 2023-11-29 (×2): qty 2

## 2023-11-29 MED ORDER — SODIUM CHLORIDE 0.9% FLUSH: 10.0000 mL | INTRAVENOUS | Status: DC | PRN

## 2023-11-29 MED ORDER — SENNOSIDES-DOCUSATE SODIUM 8.6-50 MG PO TABS
2.0000 | ORAL_TABLET | Freq: Every day | ORAL | Status: DC
  Administered 2023-11-29 – 2023-12-07 (×9): 2 via ORAL
  Filled 2023-11-29 (×9): qty 2

## 2023-11-29 MED ORDER — LACTULOSE 10 GM/15ML PO SOLN
15.0000 g | Freq: Two times a day (BID) | ORAL | Status: DC
  Administered 2023-11-29 – 2023-12-08 (×18): 15 g via ORAL
  Filled 2023-11-29 (×3): qty 30
  Filled 2023-11-29: qty 23
  Filled 2023-11-29 (×12): qty 30
  Filled 2023-11-29: qty 23
  Filled 2023-11-29 (×2): qty 30

## 2023-11-29 MED ORDER — ADULT MULTIVITAMIN W/MINERALS CH
1.0000 | ORAL_TABLET | Freq: Every day | ORAL | Status: DC
  Administered 2023-11-29 – 2023-12-07 (×9): 1 via ORAL
  Filled 2023-11-29 (×9): qty 1

## 2023-11-29 NOTE — Progress Notes (Signed)
PT Cancellation Note  Patient Details Name: NATOSHIA SOUTER MRN: [id number] DOB: [birth year]   Cancelled Treatment:    Reason Eval/Treat Not Completed: Medical issues which prohibited therapy. Medical team in room-pt going back on BiPAP. PT will check back another day once pt is medically ready    Faye Ramsay, PT Acute Rehabilitation  Office: [phone]

## 2023-11-29 NOTE — Evaluation (Signed)
Occupational Therapy Evaluation Patient Details Name: Brenda Gibson MRN: [id number] DOB: November 26, 1940 Today's Date: 11/29/2023   History of Present Illness Patient is a 35 year ld female who presented with  Wheezing, shortness of breath and cough. Patient was admitted with RSV, AMS, and hypercapneic/hypoxic respiratory failure. PMH: tobacco abuse, HTN, COPD,   Clinical Impression   Patient is a 83 year old female who was admitted for above. Patient was living at [facility] with patient reporting being independent in ADLs at [facility] level but having assist for showering.  Patient was noted to have decreased functional activity tolerance, decreased endurance, decreased standing balance, decreased safety awareness, and decreased knowledge of AD/AE impacting participation in ADLs. Patient noted to have increased level of O2 during session with patient dropping to 86% on 6L/min with transfer to recliner able to recover to 91% with deep breathing strategies. Patient would continue to benefit from skilled OT services at this time while admitted and after d/c to address noted deficits in order to improve overall safety and independence in ADLs.        If plan is discharge home, recommend the following: A little help with walking and/or transfers;A little help with bathing/dressing/bathroom;Assistance with cooking/housework;Direct supervision/assist for medications management;Assist for transportation;Help with stairs or ramp for entrance;Direct supervision/assist for financial management    Functional Status Assessment  Patient has had a recent decline in their functional status and demonstrates the ability to make significant improvements in function in a reasonable and predictable amount of time.  Equipment Recommendations  None recommended by OT       Precautions / Restrictions Precautions Precautions: Fall Precaution Comments: monitor O2 and HR Restrictions Weight Bearing Restrictions Per Provider  Order: No      Mobility Bed Mobility Overal bed mobility: Needs Assistance Bed Mobility: Supine to Sit     Supine to sit: Contact guard     General bed mobility comments: with increased time          Balance Overall balance assessment: Mild deficits observed, not formally tested             ADL either performed or assessed with clinical judgement   ADL Overall ADL's : Needs assistance/impaired Eating/Feeding: Set up Eating/Feeding Details (indicate cue type and reason): ice chips only Grooming: Sitting;Set up   Upper Body Bathing: Minimal assistance;Sitting   Lower Body Bathing: Maximal assistance;Sitting/lateral leans   Upper Body Dressing : Minimal assistance;Sitting   Lower Body Dressing: Maximal assistance;Sitting/lateral leans   Toilet Transfer: Minimal assistance;Stand-pivot;Rolling walker (2 wheels) Toilet Transfer Details (indicate cue type and reason): to recliner in room with increased time. O2 noted to frop to 86% with return to 91% with deep breathing cues sitting in recliner. nurse made aware. Toileting- Clothing Manipulation and Hygiene: Sitting/lateral lean;Maximal assistance               Vision   Vision Assessment?: No apparent visual deficits            Pertinent Vitals/Pain Pain Assessment Pain Assessment: No/denies pain     Extremity/Trunk Assessment Upper Extremity Assessment Upper Extremity Assessment: Generalized weakness   Lower Extremity Assessment Lower Extremity Assessment: Defer to PT evaluation   Cervical / Trunk Assessment Cervical / Trunk Assessment: Kyphotic      Cognition Arousal: Alert Behavior During Therapy: Flat affect Overall Cognitive Status: Difficult to assess         General Comments: patient knew month and day of week. cooperative during session,  Home Living Family/patient expects to be discharged to:: Skilled nursing facility                  Prior  Functioning/Environment Prior Level of Function : Needs assist               ADLs Comments: patient reported usign RW and being able to do ADLs herself with help for showers.        OT Problem List: Impaired balance (sitting and/or standing);Decreased knowledge of precautions;Decreased safety awareness;Cardiopulmonary status limiting activity;Decreased knowledge of use of DME or AE;Decreased activity tolerance      OT Treatment/Interventions: Self-care/ADL training;DME and/or AE instruction;Therapeutic activities;Balance training;Neuromuscular education;Patient/family education    OT Goals(Current goals can be found in the care plan section) Acute Rehab OT Goals Patient Stated Goal: to get better OT Goal Formulation: With patient Time For Goal Achievement: 12/13/23 Potential to Achieve Goals: Fair  OT Frequency: Min 1X/week       AM-PAC OT "6 Clicks" Daily Activity     Outcome Measure Help from another person eating meals?: A Little Help from another person taking care of personal grooming?: A Lot Help from another person toileting, which includes using toliet, bedpan, or urinal?: A Lot Help from another person bathing (including washing, rinsing, drying)?: A Lot Help from another person to put on and taking off regular upper body clothing?: A Little Help from another person to put on and taking off regular lower body clothing?: A Lot 6 Click Score: 14   End of Session Equipment Utilized During Treatment: Gait belt;Rolling walker (2 wheels) Nurse Communication: Mobility status  Activity Tolerance: Patient tolerated treatment well Patient left: in chair;with call bell/phone within reach;with chair alarm set  OT Visit Diagnosis: Unsteadiness on feet (R26.81);Other abnormalities of gait and mobility (R26.89)                Time: 6962-9528 OT Time Calculation (min): 20 min Charges:  OT General Charges $OT Visit: 1 Visit OT Evaluation $OT Eval Low Complexity: 1  Low  Shareef Eddinger OTR/L, MS Acute Rehabilitation Department Office# [phone]   Selinda Flavin 11/29/2023, 12:56 PM

## 2023-11-29 NOTE — Progress Notes (Signed)
PT is refusing BiPAP after multiple attempts- RN aware.

## 2023-11-29 NOTE — Progress Notes (Signed)

## 2023-11-29 NOTE — Evaluation (Signed)
Clinical/Bedside Swallow Evaluation Patient Details  Name: Brenda Gibson MRN: [id number] Date of Birth: 1941-09-16  Today's Date: 11/29/2023 Time: SLP Start Time (ACUTE ONLY): 1015 SLP Stop Time (ACUTE ONLY): 1030 SLP Time Calculation (min) (ACUTE ONLY): 15 min  Past Medical History:  Past Medical History:  Diagnosis Date   Basal cell carcinoma of ala nasi    Cataract    COVID-19 virus infection 05/22/2021   Essential hypertension, benign 06/05/2013   Mild, progressive hypertension. Isolated systolic hypertension.   Serial BPs:  --08/02/13: 130/80, recent measurements from home range from 137/73 to 149/75 with higher measurement being when skipping furosemide due to overactive bladder symptoms --06/05/13: 150/86 --02/22/13: 118/60   Hyperglycemia 05/22/2021   Hypertension    Knee pain, bilateral    Measles    Myalgia    Osteoporosis 08/12/2014   Seborrheic dermatitis of scalp    Small bowel obstruction (HCC)    Tobacco abuse 02/24/2011   Varicella    Past Surgical History:  Past Surgical History:  Procedure Laterality Date   BASAL CELL CARCINOMA EXCISION     '11 ([facility])   CATARACT EXTRACTION W/ INTRAOCULAR LENS IMPLANT     right eye. '09 (Dr. Jettie Pagan)   INTRAMEDULLARY (IM) NAIL INTERTROCHANTERIC Right 10/27/2022   Procedure: INTRAMEDULLARY (IM) NAIL INTERTROCHANTERIC;  Surgeon: London Sheer, MD;  Location: [facility];  Service: Orthopedics;  Laterality: Right;   LAPAROTOMY N/A 06/25/2016   Procedure: EXPLORATORY LAPAROTOMY, lysis of adhesion;  Surgeon: Abigail Miyamoto, MD;  Location: [facility];  Service: General;  Laterality: N/A;   LYSIS OF ADHESION  06/25/2016   Dr Carman Ching   HPI:  83yo female admitted from [facility] 11/27/23 with respiratory distress 2/2 RSV. PMH: HTN, COPD, osteoporosis, SBO, malnutrition. CXR = shows patchy opacities in the right costophrenic angle - atelectasis vs infiltrate    Assessment / Plan / Recommendation  Clinical Impression  Pt presents with generalized  weakness and increased aspiration risk given COPD and RSV. Pt is edentulous without dentures. She reports no difficulty swallowing prior to admit. CN exam unremarkable. Volitional cough is weak, congested, and nonproductive. RN present initially providing PO meds with liquid. No overt s/s aspiration observed or reported. COPD increases risk for silent aspiration which cannot be determined at bedside. Recommend consideration of palliative care consult to facilitate appropriate goals of care.  Pt then accepted trials of thin liquid, puree, and soft solid textures. Timely oral prep, no overt s/s aspiration across textures. Pt unable to pass 3oz water challenge given SOB. Recommend beginning regular diet and thin liquids - pt verbalizes awareness of solid textures she can masticate without dentition. Small bites/sips at slow rate recommended. SLP will follow to assess diet tolerance and determine if instrumental assessment is needed.  SLP Visit Diagnosis: Dysphagia, unspecified (R13.10)    Aspiration Risk  Moderate aspiration risk;Risk for inadequate nutrition/hydration    Diet Recommendation Regular;Thin liquid (to allow full range of choices)    Liquid Administration via: Straw;Cup Medication Administration: Whole meds with liquid Supervision: Patient able to self feed;Intermittent supervision to cue for compensatory strategies Compensations: Minimize environmental distractions;Slow rate;Small sips/bites Postural Changes: Seated upright at 90 degrees;Remain upright for at least 30 minutes after po intake    Other  Recommendations Oral Care Recommendations: Oral care BID    Recommendations for follow up therapy are one component of a multi-disciplinary discharge planning process, led by the attending physician.  Recommendations may be updated based on patient status, additional functional criteria and insurance authorization.  Follow up Recommendations Follow physician's recommendations for  discharge plan and follow up therapies      Functional Status Assessment Patient has had a recent decline in their functional status and/or demonstrates limited ability to make significant improvements in function in a reasonable and predictable amount of time  Frequency and Duration min 1 x/week  1 week;2 weeks       Prognosis Prognosis for improved oropharyngeal function: Fair Barriers to Reach Goals: Severity of deficits      Swallow Study   General Date of Onset: 11/27/23 HPI: 83yo female admitted from [facility] 11/27/23 with respiratory distress 2/2 RSV. PMH: HTN, COPD, osteoporosis, SBO, malnutrition. CXR = shows patchy opacities in the right costophrenic angle - atelectasis vs infiltrate Type of Study: Bedside Swallow Evaluation Previous Swallow Assessment: none found Diet Prior to this Study: NPO Temperature Spikes Noted: No Respiratory Status: Nasal cannula History of Recent Intubation: No Behavior/Cognition: Alert;Cooperative Oral Cavity Assessment: Within Functional Limits Oral Care Completed by SLP: No Oral Cavity - Dentition: Edentulous Vision: Functional for self-feeding Self-Feeding Abilities: Able to feed self Patient Positioning: Upright in chair Baseline Vocal Quality: Normal Volitional Cough: Congested;Weak Volitional Swallow: Able to elicit    Oral/Motor/Sensory Function Overall Oral Motor/Sensory Function: Generalized oral weakness   Ice Chips Ice chips: Not tested   Thin Liquid Presentation: Self Fed;Cup;Straw    Nectar Thick Nectar Thick Liquid: Not tested   Honey Thick Honey Thick Liquid: Not tested   Puree Puree: Within functional limits Presentation: Spoon   Solid     Solid: Within functional limits Presentation: Self Fed     Camylle Whicker B. Murvin Natal, MSP, CCC-SLP Speech Language Pathologist  [facility] 11/29/2023,10:40 AM

## 2023-11-29 NOTE — Progress Notes (Signed)
[facility]                                                                               Arly Salminen, is a 83 y.o. female, DOB - [birth year], WUJ:[id number] Admit date - 11/27/2023    Outpatient Primary MD for the patient is Pcp, No  LOS - 1  days    Brief summary    Brenda Gibson is a 83 y.o. female with past medical history  of essential hypertension, COPD, osteoporosis, history of small bowel obstruction, protein calorie malnutrition presenting in respiratory distress via EMS from skilled nursing facility patient's had progressive worsening of shortness of breath cough wheezing.   Assessment & Plan    Assessment and Plan: * Acute respiratory failure with hypoxia and hypercapnia (HCC) secondary to RSV infection and acute copd exacerbation COVID negative.  Patient presenting with acute respiratory failure hypoxia hypercapnia and respiratory distress. Chest x-ray today shows patchy opacities in the right costophrenic angle. Initially required BIPAP, currently on [place] oxygen, keep sats around 90%.  Started the patient on IV solumedrol 60 mg every 24 hours, continue with duonebs.  Added mucinex BID and tessalon prn.  Empirically started on IV ceftriaxone.  Please obtain Urine strep pneumoniae antigen.  Wean off oxygen as tolerated. Currently requiring upto 5 lit of [place] oxygen.     Essential hypertension,  Uncontrolled.  Patient on hydrochlorothiazide at home.  Added norvasc.  Hydralazine prn.   Tobacco abuse Will add nicotine patch.    Hypokalemia Replaced.   GERD Stable on PPI.   In view of debility, poor functional status, increased oxygen requirement, will request palliative care consult for goals of care.    RN Pressure Injury Documentation:    Malnutrition Type:  Nutrition Problem: Inadequate oral intake Etiology: inability to eat   Malnutrition Characteristics:  Signs/Symptoms: NPO status   Nutrition Interventions:  Interventions:  Refer to RD note for recommendations  Estimated body mass index is 20.21 kg/m as calculated from the following:   Height as of this encounter: 5\' 5"  (1.651 m).   Weight as of this encounter: 55.1 kg.  Code Status: Full code.  DVT Prophylaxis:  heparin injection 5,000 Units Start: 11/28/23 0100   Level of Care: Level of care: Stepdown Family Communication: none at bedside.   Disposition Plan:     Remains inpatient appropriate:  still requiring 5 lit of [place] oxyge, waiting for clinical improvement.   Procedures:  Echocardiogram.   Consultants:   Palliative care   Antimicrobials:   Anti-infectives (From admission, onward)    Start     Dose/Rate Route Frequency Ordered Stop   11/28/23 0330  azithromycin (ZITHROMAX) 500 mg in sodium chloride 0.9 % 250 mL IVPB        500 mg 250 mL/hr over 60 Minutes Intravenous  Once 11/28/23 0324 11/28/23 0705   11/28/23 0115  cefTRIAXone (ROCEPHIN) 2 g in sodium chloride 0.9 % 100 mL IVPB        2 g 200 mL/hr over 30 Minutes Intravenous Every 24 hours 11/28/23 0107 12/03/23 0114   11/28/23 0115  azithromycin (ZITHROMAX) tablet 500 mg  500 mg Oral Daily 11/28/23 0107          Medications  Scheduled Meds:  aspirin EC  81 mg Oral QHS   azithromycin  500 mg Oral Daily   Chlorhexidine Gluconate Cloth  6 each Topical Daily   clopidogrel  75 mg Oral Daily   docusate sodium  100 mg Oral BID   guaiFENesin  600 mg Oral BID   heparin  5,000 Units Subcutaneous Q12H   hydrochlorothiazide  12.5 mg Oral Daily   lactulose (encephalopathy)  15 g Oral BID   multivitamin with minerals  1 tablet Oral QHS   nicotine  14 mg Transdermal Daily   mouth rinse  15 mL Mouth Rinse 4 times per day   pantoprazole (PROTONIX) IV  40 mg Intravenous Q24H   potassium chloride  40 mEq Oral BID   sennosides-docusate sodium  2 tablet Oral QHS   sodium chloride flush  3 mL Intravenous Q12H   Continuous Infusions:  cefTRIAXone (ROCEPHIN)  IV Stopped (11/29/23  0225)   PRN Meds:.acetaminophen, benzonatate, hydrALAZINE, HYDROcodone-acetaminophen, ipratropium-albuterol, morphine injection, mouth rinse    Subjective:   Brenda Gibson was seen and examined today.  Still sob, coughing not feeling good.   Objective:   Vitals:   11/29/23 0742 11/29/23 0800 11/29/23 0900 11/29/23 1031  BP:  (!) 150/70 (!) 172/83   Pulse:  65 73   Resp:  18 17   Temp:  97.9 F (36.6 C)    TempSrc:  Oral    SpO2: 94% 94% (!) 87% 95%  Weight:      Height:        Intake/Output Summary (Last 24 hours) at 11/29/2023 1211 Last data filed at 11/29/2023 1018 Gross per 24 hour  Intake 106.07 ml  Output --  Net 106.07 ml   Filed Weights   11/27/23 1828 11/28/23 1055  Weight: 63.5 kg 55.1 kg     Exam General exam: ill appearing elderly lady , in mild distress from sob. On 5 lit of [place] oxygen  Respiratory system: diffuse wheezing, On [place] oxygen Cardiovascular system: S1 & S2 heard, RRR. No JVD. Gastrointestinal system: Abdomen is nondistended, soft and nontender. Central nervous system: Alert and oriented to place and person. Extremities: warm extremities, no pedal edema.  Skin: No rashes, Psychiatry:  Mood & affect appropriate.     Data Reviewed:  I have personally reviewed following labs and imaging studies   CBC Lab Results  Component Value Date   WBC 12.3 (H) 11/29/2023   RBC 5.05 11/29/2023   HGB 11.5 (L) 11/29/2023   HCT 40.1 11/29/2023   MCV 79.4 (L) 11/29/2023   MCH 22.8 (L) 11/29/2023   PLT 293 11/29/2023   MCHC 28.7 (L) 11/29/2023   RDW 21.9 (H) 11/29/2023   LYMPHSABS 1.7 11/29/2023   MONOABS 1.0 11/29/2023   EOSABS 0.0 11/29/2023   BASOSABS 0.0 11/29/2023     Last metabolic panel Lab Results  Component Value Date   NA 135 11/29/2023   K 3.3 (L) 11/29/2023   CL 91 (L) 11/29/2023   CO2 34 (H) 11/29/2023   BUN 28 (H) 11/29/2023   CREATININE 0.57 11/29/2023   GLUCOSE 97 11/29/2023   GFRNONAA >60 11/29/2023   GFRAA >60  07/07/2016   CALCIUM 8.8 (L) 11/29/2023   PHOS 3.2 12/12/2021   PROT 7.7 07/17/2023   ALBUMIN 4.1 07/17/2023   BILITOT 0.4 07/17/2023   ALKPHOS 70 07/17/2023   AST 20 07/17/2023   ALT 16  07/17/2023   ANIONGAP 10 11/29/2023    CBG (last 3)  No results for input(s): "GLUCAP" in the last 72 hours.    Coagulation Profile: No results for input(s): "INR", "PROTIME" in the last 168 hours.   Radiology Studies: DG Chest Port 1 View Result Date: 11/27/2023 CLINICAL DATA:  Respiratory distress EXAM: PORTABLE CHEST 1 VIEW COMPARISON:  Chest x-ray 11/02/2022 FINDINGS: There some minimal patchy opacities in the right costophrenic angle. There is no pleural effusion or pneumothorax. The cardiomediastinal silhouette is within normal limits. No acute fractures are seen. IMPRESSION: Minimal patchy opacities in the right costophrenic angle, atelectasis versus infiltrate. Electronically Signed   By: Darliss Cheney M.D.   On: 11/27/2023 19:22       Kathlen Mody M.D. [facility] 11/29/2023, 12:11 PM  Available via Epic secure chat 7am-7pm After 7 pm, please refer to night coverage provider listed on amion.

## 2023-11-30 DIAGNOSIS — E440 Moderate protein-calorie malnutrition: Secondary | ICD-10-CM

## 2023-11-30 DIAGNOSIS — Z7189 Other specified counseling: Secondary | ICD-10-CM | POA: Diagnosis not present

## 2023-11-30 DIAGNOSIS — J9601 Acute respiratory failure with hypoxia: Secondary | ICD-10-CM

## 2023-11-30 DIAGNOSIS — Z515 Encounter for palliative care: Secondary | ICD-10-CM

## 2023-11-30 DIAGNOSIS — J9602 Acute respiratory failure with hypercapnia: Secondary | ICD-10-CM

## 2023-11-30 DIAGNOSIS — J441 Chronic obstructive pulmonary disease with (acute) exacerbation: Secondary | ICD-10-CM

## 2023-11-30 DIAGNOSIS — B338 Other specified viral diseases: Secondary | ICD-10-CM

## 2023-11-30 LAB — CBC WITH DIFFERENTIAL/PLATELET
Abs Immature Granulocytes: 0.11 10*3/uL — ABNORMAL HIGH (ref 0.00–0.07)
Basophils Absolute: 0 10*3/uL (ref 0.0–0.1)
Basophils Relative: 0 %
Eosinophils Absolute: 0 10*3/uL (ref 0.0–0.5)
Eosinophils Relative: 0 %
HCT: 42.4 % (ref 36.0–46.0)
Hemoglobin: 12.6 g/dL (ref 12.0–15.0)
Immature Granulocytes: 1 %
Lymphocytes Relative: 14 %
Lymphs Abs: 1.3 10*3/uL (ref 0.7–4.0)
MCH: 22.9 pg — ABNORMAL LOW (ref 26.0–34.0)
MCHC: 29.7 g/dL — ABNORMAL LOW (ref 30.0–36.0)
MCV: 77.1 fL — ABNORMAL LOW (ref 80.0–100.0)
Monocytes Absolute: 0.8 10*3/uL (ref 0.1–1.0)
Monocytes Relative: 9 %
Neutro Abs: 6.9 10*3/uL (ref 1.7–7.7)
Neutrophils Relative %: 76 %
Platelet Morphology: NORMAL
Platelets: 345 10*3/uL (ref 150–400)
RBC: 5.5 MIL/uL — ABNORMAL HIGH (ref 3.87–5.11)
RDW: 22 % — ABNORMAL HIGH (ref 11.5–15.5)
WBC: 9.1 10*3/uL (ref 4.0–10.5)
nRBC: 0 % (ref 0.0–0.2)

## 2023-11-30 LAB — COMPREHENSIVE METABOLIC PANEL
ALT: 20 U/L (ref 0–44)
AST: 20 U/L (ref 15–41)
Albumin: 3.6 g/dL (ref 3.5–5.0)
Alkaline Phosphatase: 75 U/L (ref 38–126)
Anion gap: 15 (ref 5–15)
BUN: 33 mg/dL — ABNORMAL HIGH (ref 8–23)
CO2: 35 mmol/L — ABNORMAL HIGH (ref 22–32)
Calcium: 9.6 mg/dL (ref 8.9–10.3)
Chloride: 89 mmol/L — ABNORMAL LOW (ref 98–111)
Creatinine, Ser: 0.49 mg/dL (ref 0.44–1.00)
GFR, Estimated: >60 mL/min (ref >60)
Glucose, Bld: 127 mg/dL — ABNORMAL HIGH (ref 70–99)
Potassium: 3.9 mmol/L (ref 3.5–5.1)
Sodium: 139 mmol/L (ref 135–145)
Total Bilirubin: 0.7 mg/dL (ref 0.0–1.2)
Total Protein: 7 g/dL (ref 6.5–8.1)

## 2023-11-30 LAB — MAGNESIUM: Magnesium: 2.7 mg/dL — ABNORMAL HIGH (ref 1.7–2.4)

## 2023-11-30 LAB — PHOSPHORUS: Phosphorus: 2.4 mg/dL — ABNORMAL LOW (ref 2.5–4.6)

## 2023-11-30 MED ORDER — ENSURE ENLIVE PO LIQD
237.0000 mL | Freq: Two times a day (BID) | ORAL | Status: DC
  Administered 2023-11-30 – 2023-12-08 (×5): 237 mL via ORAL

## 2023-11-30 MED ORDER — PANTOPRAZOLE SODIUM 40 MG PO TBEC
40.0000 mg | DELAYED_RELEASE_TABLET | Freq: Every day | ORAL | Status: DC
  Administered 2023-11-30 – 2023-12-07 (×8): 40 mg via ORAL
  Filled 2023-11-30 (×8): qty 1

## 2023-11-30 MED ORDER — K PHOS MONO-SOD PHOS DI & MONO 155-852-130 MG PO TABS
500.0000 mg | ORAL_TABLET | Freq: Two times a day (BID) | ORAL | Status: DC
  Administered 2023-11-30 – 2023-12-04 (×7): 500 mg via ORAL
  Filled 2023-11-30 (×9): qty 2

## 2023-11-30 MED ORDER — METHYLPREDNISOLONE SODIUM SUCC 40 MG IJ SOLR
40.0000 mg | Freq: Every day | INTRAMUSCULAR | Status: DC
  Administered 2023-12-01 – 2023-12-04 (×4): 40 mg via INTRAVENOUS
  Filled 2023-11-30 (×4): qty 1

## 2023-11-30 MED ORDER — ONDANSETRON HCL 4 MG/2ML IJ SOLN: 4.0000 mg | Freq: Four times a day (QID) | INTRAMUSCULAR | Status: DC | PRN

## 2023-11-30 MED ORDER — MELATONIN 5 MG PO TABS
5.0000 mg | ORAL_TABLET | Freq: Every evening | ORAL | Status: AC | PRN
  Administered 2023-11-30 – 2023-12-03 (×3): 5 mg via ORAL
  Filled 2023-11-30 (×3): qty 1

## 2023-11-30 NOTE — Evaluation (Signed)
Physical Therapy Evaluation Patient Details Name: Brenda Gibson MRN: [id number] DOB: 1941-07-09 Today's Date: 11/30/2023  History of Present Illness  Patient is a 70 year ld female who presented with  Wheezing, shortness of breath and cough. Patient was admitted with RSV, AMS, and hypercapneic/hypoxic respiratory failure. PMH: tobacco abuse, HTN, COPD,  Clinical Impression   Pt admitted with above diagnosis. Pt in chair position in bed when PT arrived. Pt agreeable to therapy intervention. Pt is soft spoken and intermittently demonstrating signs and symptoms of SOB. Pt on HHFNC 50 L/min at 40% Fio2 throughout eval. Pt required min A for supine to sit, min A for sit to stand from EOB, pt able to progress to short amb bout 2 feet anteriorly and with retrograde pattern to return to EOB with RW, cues and min A, pt able to side step 2 feet with RW to the R toward HOB with RW and CGA. Pt returned to bed with mod A for B LE and min A for trunk. Pt left in bed and all needs in place. Patient will benefit from continued inpatient follow up therapy, <3 hours/day.  Pt currently with functional limitations due to the deficits listed below (see PT Problem List). Pt will benefit from acute skilled PT to increase their independence and safety with mobility to allow discharge.         If plan is discharge home, recommend the following: A little help with walking and/or transfers;A little help with bathing/dressing/bathroom;Assistance with cooking/housework;Assist for transportation;Help with stairs or ramp for entrance   Can travel by private vehicle        Equipment Recommendations Other (comment) (TBD in next venue)  Recommendations for Other Services       Functional Status Assessment Patient has had a recent decline in their functional status and demonstrates the ability to make significant improvements in function in a reasonable and predictable amount of time.     Precautions / Restrictions  Precautions Precautions: Fall Precaution Comments: monitor O2 and HR Restrictions Weight Bearing Restrictions Per Provider Order: No      Mobility  Bed Mobility Overal bed mobility: Needs Assistance Bed Mobility: Supine to Sit, Sit to Supine     Supine to sit: Min assist, HOB elevated Sit to supine: Mod assist   General bed mobility comments: with increased time and cues    Transfers Overall transfer level: Needs assistance Equipment used: Rolling walker (2 wheels) Transfers: Sit to/from Stand Sit to Stand: Min assist           General transfer comment: min cues    Ambulation/Gait Ambulation/Gait assistance: Min assist Gait Distance (Feet): 2 Feet Assistive device: Rolling walker (2 wheels) Gait Pattern/deviations: Shuffle, Trunk flexed Gait velocity: decreased     General Gait Details: shuffling pattern with narrow BOS with pt able to amb 2 feet anteriorly and then retrograde stepping pattern to EOB, pt required seated theraputic rest break and cues for breathing stratagies and then able to side step to the R toward [facility] with RW and CGA. pt O2 saturation 90-93% on HHFNC 50 L/min at 40% Fio2  Stairs            Wheelchair Mobility     Tilt Bed    Modified Rankin (Stroke Patients Only)       Balance Overall balance assessment: Mild deficits observed, not formally tested  Pertinent Vitals/Pain Pain Assessment Pain Assessment: No/denies pain    Home Living Family/patient expects to be discharged to:: Skilled nursing facility                        Prior Function Prior Level of Function : Needs assist               ADLs Comments: patient reported using RW and being able to do ADLs herself with help for showers.     Extremity/Trunk Assessment   Upper Extremity Assessment Upper Extremity Assessment: Defer to OT evaluation    Lower Extremity Assessment Lower Extremity  Assessment: Generalized weakness    Cervical / Trunk Assessment Cervical / Trunk Assessment: Kyphotic  Communication   Communication Communication: No apparent difficulties  Cognition Arousal: Alert Behavior During Therapy: Flat affect Overall Cognitive Status: Difficult to assess                                          General Comments      Exercises     Assessment/Plan    PT Assessment Patient needs continued PT services  PT Problem List Decreased strength;Decreased activity tolerance;Decreased mobility;Decreased balance;Decreased coordination;Cardiopulmonary status limiting activity       PT Treatment Interventions DME instruction;Gait training;Functional mobility training;Therapeutic activities;Therapeutic exercise;Balance training;Neuromuscular re-education;Patient/family education    PT Goals (Current goals can be found in the Care Plan section)  Acute Rehab PT Goals Patient Stated Goal: to go home PT Goal Formulation: With patient Time For Goal Achievement: 12/13/23 Potential to Achieve Goals: Fair    Frequency Min 1X/week     Co-evaluation               AM-PAC PT "6 Clicks" Mobility  Outcome Measure Help needed turning from your back to your side while in a flat bed without using bedrails?: A Little Help needed moving from lying on your back to sitting on the side of a flat bed without using bedrails?: A Little Help needed moving to and from a bed to a chair (including a wheelchair)?: A Lot Help needed standing up from a chair using your arms (e.g., wheelchair or bedside chair)?: A Little Help needed to walk in hospital room?: A Lot Help needed climbing 3-5 steps with a railing? : Total 6 Click Score: 14    End of Session Equipment Utilized During Treatment: Gait belt;Oxygen Activity Tolerance: Patient limited by fatigue Patient left: in bed;with call bell/phone within reach;with bed alarm set Nurse Communication: Mobility  status PT Visit Diagnosis: Unsteadiness on feet (R26.81);Other abnormalities of gait and mobility (R26.89);Muscle weakness (generalized) (M62.81);Difficulty in walking, not elsewhere classified (R26.2)    Time: 8295-6213 PT Time Calculation (min) (ACUTE ONLY): 32 min   Charges:   PT Evaluation $PT Eval Low Complexity: 1 Low PT Treatments $Therapeutic Activity: 8-22 mins PT General Charges $$ ACUTE PT VISIT: 1 Visit         Johnny Bridge, PT Acute Rehab   Jacqualyn Posey 11/30/2023, 5:06 PM

## 2023-11-30 NOTE — Consult Note (Signed)
Palliative Care Consult Note                                  Date: 11/30/2023   Patient Name: Brenda Gibson  DOB: [birth year]  MRN: [id number]  Age / Sex: 83 y.o., female  PCP: Pcp, No Referring Physician: Tyrone Nine, MD  Reason for Consultation: Establishing goals of care  HPI/Patient Profile: 83 y.o. female  with past medical history of COPD, HTN, SBO, osteoporosis who presented to the ED from SNF on 11/27/2023 with respiratory distress, cough, wheezing. She was admitted on 11/27/2023 with acute hypoxic and hypercapnic respiratory failure due to acute exacerbation of COPD secondary to RSV bronchiolitis, and others.   Palliative medicine was consulted for GOC conversations.  Past Medical History:  Diagnosis Date   Basal cell carcinoma of ala nasi    Cataract    COVID-19 virus infection 05/22/2021   Essential hypertension, benign 06/05/2013   Mild, progressive hypertension. Isolated systolic hypertension.   Serial BPs:  --08/02/13: 130/80, recent measurements from home range from 137/73 to 149/75 with higher measurement being when skipping furosemide due to overactive bladder symptoms --06/05/13: 150/86 --02/22/13: 118/60   Hyperglycemia 05/22/2021   Hypertension    Knee pain, bilateral    Measles    Myalgia    Osteoporosis 08/12/2014   Seborrheic dermatitis of scalp    Small bowel obstruction (HCC)    Tobacco abuse 02/24/2011   Varicella     Subjective:   This NP Wynne Dust reviewed medical records, received report from team, assessed the patient and then meet at the patient's bedside to discuss diagnosis, prognosis, GOC, EOL wishes disposition and options.  I met with the patient at bedside, no family is present.   We meet to discuss diagnosis prognosis, GOC, EOL wishes, disposition and options. Concept of Palliative Care was introduced as specialized medical care for people and their families living with serious illness.  If focuses  on providing relief from the symptoms and stress of a serious illness.  The goal is to improve quality of life for both the patient and the family. Values and goals of care important to patient and family were attempted to be elicited.  Created space and opportunity for patient  and family to explore thoughts and feelings regarding current medical situation   Natural trajectory and current clinical status were discussed. Questions and concerns addressed. Patient  encouraged to call with questions or concerns.    Patient/Family Understanding of Illness: Patient seemed to not understand her current health situation.  She denied knowing COPD illness history, denies lung problem in general, did not know that she has RSV.  When I saw her she seemed quite exhausted, on heated high flow nasal cannula.  Previously requiring BiPAP.  Later in the day I called and spoke with the patient's legal guardian, after discovering the existence of a legal guardian.  I explained patient's current clinical situation.  Life Review: Patient lives at [facility], states that she is generally functional and enjoys living [place].  She has 4 children, all boys, all of which lives locally.  Goals: To get better  Today's Discussion: For further discussion was a little bit limited due to the patient's difficulty in carrying on conversation.  She appears quite short of breath with a lot of talking.  I spent a good amount of time debriefing with the patient's bedside nurse.  We reviewed  paperwork that was provided (apparently today?) from [place] nursing home.  Reviewing the papers it seems that the patient has a legal guardian.  I updated the facesheet to reflect legal guardian's emergency contact, first point of contact, to be notified on admission.   Later in the day I called the DSS guardian Redmond Pulling.  She confirmed that she is the patient's legal guardian with DSS.  She states that the facility notified her  that the patient was transferred to the hospital, however she had not heard anything from [place] since admission.  I explained that our records did not reflect the status of having a legal guardian until we were able to review the paperwork from the nursing home.  I assured her that the facesheet is since been updated to reflect her legal guardian status along with the legal guardian's contact information.  I spent some time reviewing her current clinical status.  It appears she is somewhat better today compared to yesterday, however she is not quite a serious situation with pre-existing COPD and now with RSV.  I confirmed the existence of a MOST form, which she states was completed prior to her being assigned.  I shared that the MOST form indicated full code and full scope of care.  I shared that this i may be a point of possible discussion depending on how the patient does, which she agreed that we could talk about it if she does not do well.  She states that if the hospital recommends a DNR status at some point that that would need to be completed on their paperwork and sent up the [facility] for approval.  He would also require the attending physician and one not attending physician at the hospital to sign.  After our discussion we decided to allow some time for outcomes, palliative medicine will follow-up tomorrow to see how the patient is doing and to assess her mental status.  Her guardian shares that she would want the patient input on her care, if she is able to provide such input.  Review of Systems  Respiratory:  Positive for cough and shortness of breath.   Gastrointestinal:  Negative for abdominal pain, nausea and vomiting.    Objective:   Primary Diagnoses: Present on Admission:  Tobacco abuse  Essential hypertension, benign  Acute respiratory failure with hypoxia and hypercapnia (HCC)  COPD exacerbation (HCC)  Respiratory distress  RSV (respiratory syncytial virus pneumonia)   Physical  Exam Vitals and nursing note reviewed.  Constitutional:      General: She is not in acute distress.    Appearance: She is ill-appearing.     Comments: Overall appears quite sleepy/fatigued  HENT:     Head: Normocephalic and atraumatic.  Cardiovascular:     Rate and Rhythm: Normal rate.  Pulmonary:     Effort: Pulmonary effort is normal. No respiratory distress.  Abdominal:     General: Abdomen is flat.     Palpations: Abdomen is soft.  Skin:    General: Skin is warm and dry.  Psychiatric:        Mood and Affect: Mood normal.        Behavior: Behavior normal.     Vital Signs:  BP (!) 205/94   Pulse 61   Temp 97.9 F (36.6 C) (Oral)   Resp 15   Ht 5\' 5"  (1.651 m)   Wt 55.1 kg   SpO2 95%   BMI 20.21 kg/m   Palliative Assessment/Data: 30%  Advanced Care Planning:   Existing Vynca/ACP Documentation: MOST form signed 10/13/2023  Primary Decision Maker: LEGAL GUARDIAN  Code Status/Advance Care Planning: Full code  A discussion was had today regarding advanced directives. Concepts specific to code status, artifical feeding and hydration, continued IV antibiotics and rehospitalization was had.  The difference between a aggressive medical intervention path and a palliative comfort care path for this patient at this time was had.  Confirmed MOST form with guardian indicating full code and full scope of care.  Decisions/Changes to ACP: None today, uploaded pre-existing MOST form  Assessment & Plan:   Impression: 82 year old female with acute presentation of chronic comorbidities as described above.  The patient is quite ill with COPD exacerbation secondary to RSV bronchiolitis.  She is on high flow nasal cannula right now, appears quite fatigued and does not appear to feel well.  Reached out and confirmed admission with the patient's legal guardian which was just discovered today.  Reviewed her current clinical situation.  Overall the plan at this time is time for  outcomes, make decisions as needed.  I shared the palliative medicine will continue to follow.  Overall long-term prognosis poor.  SUMMARY OF RECOMMENDATIONS   Remain full code Full scope of care Reach out to legal guardian (contact info and facesheet) as needed Palliative medicine will follow-up tomorrow Ongoing GOC conversations and decisions as needed  Symptom Management:  Per primary team PMT is available to assist as needed  Prognosis:  Unable to determine  Discharge Planning:  To Be Determined   Discussed with: Patient, legal guardian, medical team, nursing team    Thank you for allowing [place] to participate in the care of YAZLEEN MOLOCK PMT will continue to support holistically.  Time Total: 90 min  Detailed review of medical records (labs, imaging, vital signs), medically appropriate exam, discussed with treatment team, counseling and education to patient, family, & staff, documenting clinical information, medication management, coordination of care  Signed by: Wynne Dust, NP Palliative Medicine Team  Team Phone # [phone] (Nights/Weekends)  11/30/2023, 12:54 PM

## 2023-11-30 NOTE — Progress Notes (Addendum)
    Patient Name: Brenda Gibson           DOB: 29-Mar-1941  MRN: [id number]      Admission Date: 11/27/2023  Attending Provider: Tyrone Nine, MD  Primary Diagnosis: Acute respiratory failure with hypoxia and hypercapnia [facility])   Level of care: Stepdown    CROSS COVER NOTE   Date of Service   11/30/2023   [name], 83 y.o. female, was admitted on 11/27/2023 for Acute respiratory failure with hypoxia and hypercapnia (HCC).    HPI/Events of Note   Restless, and confused.   Currently removing medical equipment: IV midline, telemetry, oxygen supplies. Unfortunately, she is not following commands despite multiple attempts at redirection by staff.    Interventions/ Plan   Melatonin has also been ordered to assist with sleep cycle. Restraint in place. Discontinued as soon as patient is able to safely have them removed.        Anthoney Harada, DNP, ACNPC- AG [facility]

## 2023-11-30 NOTE — Progress Notes (Signed)
Nutrition Follow-up  DOCUMENTATION CODES:   Non-severe (moderate) malnutrition in context of chronic illness  INTERVENTION:  - Regular diet.  - Magic cup TID with meals, each supplement provides 290 kcal and 9 grams of protein - Multivitamin with minerals daily. - Monitor weight trends.  NUTRITION DIAGNOSIS:   Moderate Malnutrition related to chronic illness (COPD) as evidenced by moderate fat depletion, severe muscle depletion. *new  GOAL:   Patient will meet greater than or equal to 90% of their needs *progressing  MONITOR:   PO intake, Supplement acceptance, Weight trends  REASON FOR ASSESSMENT:   Consult Assessment of nutrition requirement/status  ASSESSMENT:   83 y.o. female with PMH of essential HTN, COPD, osteoporosis, history of small bowel obstruction who presented via EMS from skilled nursing facility with progressive worsening of shortness of breath, cough, wheezing.  Patient on HFNC at time of visit. She reports a UBW of 135# and does not think she has lost any weight recently. Per EMR, weight history over the past year as below: 07/17/23: 140# 10/21/23: 140# 11/27/23: 140# 11/28/23: 121#   As all weights from September to January are exactly the same, question if they were copied over. Weight 1/27 much lower, which could suggest possible weight loss at some point during the last 4 months.  She reports "trying" to eat 3 meals a day at home. Does not consume any nutrition supplements at home.   Patient shares her current appetite is normal and she has been trying to eat well. No meal intakes documented to assess PO intake since diet advancement yesterday.  She does not like traditional supplements but agreeable to try [employer], likes vanilla.   Medications reviewed and include: Colace, Senokot, Lasix, MVI  Labs reviewed:  Phosphorus 2.4  Magnesium 2.7   NUTRITION - FOCUSED PHYSICAL EXAM:  Flowsheet Row Most Recent Value  Orbital Region Moderate  depletion  Upper Arm Region Moderate depletion  Thoracic and Lumbar Region Unable to assess  Buccal Region Severe depletion  Temple Region Severe depletion  Clavicle Bone Region Severe depletion  Clavicle and Acromion Bone Region Severe depletion  Scapular Bone Region Unable to assess  Dorsal Hand Severe depletion  Patellar Region Moderate depletion  Anterior Thigh Region Moderate depletion  Posterior Calf Region Mild depletion  Edema (RD Assessment) None  Hair Reviewed  Eyes Reviewed  Mouth Reviewed  Skin Reviewed  Nails Reviewed       Diet Order:   Diet Order             Diet regular Room service appropriate? Yes; Fluid consistency: Thin  Diet effective now                   EDUCATION NEEDS:  Education needs have been addressed  Skin:  Skin Assessment: Reviewed RN Assessment  Last BM:  1/26  Height:  Ht Readings from Last 1 Encounters:  11/28/23 5\' 5"  (1.651 m)   Weight:  Wt Readings from Last 1 Encounters:  11/28/23 55.1 kg    BMI:  Body mass index is 20.21 kg/m.  Estimated Nutritional Needs:  Kcal:  1550-1750 kcals Protein:  70-80 grams Fluid:  >/= 1.6L    Shelle Iron RD, LDN Contact via Secure Chat.

## 2023-11-30 NOTE — Plan of Care (Signed)
  Problem: Respiratory: Goal: Ability to maintain a clear airway will improve Outcome: Progressing   Problem: Education: Goal: Knowledge of disease or condition will improve Outcome: Not Progressing Goal: Knowledge of the prescribed therapeutic regimen will improve Outcome: Not Progressing   Problem: Activity: Goal: Ability to tolerate increased activity will improve Outcome: Not Progressing Goal: Will verbalize the importance of balancing activity with adequate rest periods Outcome: Not Progressing   Problem: Respiratory: Goal: Levels of oxygenation will improve Outcome: Not Progressing Goal: Ability to maintain adequate ventilation will improve Outcome: Not Progressing

## 2023-11-30 NOTE — Progress Notes (Signed)
[facility] PROGRESS NOTE  Brenda Gibson (DOB: [birth year]) ZOX:[id number] PCP: Pcp, No  Brief Narrative: Brenda Gibson is an 83 y.o. female with a history of COPD, HTN, SBO, osteoporosis who presented to the ED from SNF on 11/27/2023 with respiratory distress, cough, wheezing. She was admitted to SDU requiring BiPAP for AECOPD due to RSV infection. Overall has improved, but remains with HFNC/BiPAP. Palliative care is following.   Subjective: Pt feels slightly better than previously, denies a cough but has several during encounter. No chest pain or other pain. She ate most of breakfast.   Objective: BP (!) 177/84   Pulse 94   Temp 97.9 F (36.6 C) (Oral)   Resp (!) 32   Ht 5\' 5"  (1.651 m)   Wt 55.1 kg   SpO2 92%   BMI 20.21 kg/m   Gen: No distress, elderly, frail Pulm: Diminished without wheezes or crackles.   CV: RRR, no MRG or edema or JVD GI: Soft, NT, ND, +BS  Neuro: Alert and oriented. No new focal deficits. Ext: Warm, no deformities. Skin: No acute appearing rashes, lesions or ulcers on visualized skin   Assessment & Plan: Acute hypoxic respiratory failure with hypoxia and hypercapnia: Due to AECOPD due to RSV bronchiolitis.  - Continue to support breathing with prn BiPAP and supplemental oxygen while treating conditions below.  - Note BNP elevation (possibly chronic), though echocardiogram showed preserved LVEF and RVSF. Getting IV lasix, though does not appear overloaded. Will repeat CXR and check BNP.  - PCT negative, no consolidation noted, will DC abx.  - Pt's frailty, malnutrition, tenuous respiratory status with slow improvement > palliative care consulted.   AECOPD due to RSV infection:  - No wheezing, will continue duonebs and steroids, though can taper dose slightly.  - Continue mucinex BID and tessalon prn.    PAD:  - Continue routine vascular surgery follow up (asymptomatic at visit 10/21/2023, f/u 1 year).    Essential hypertension,  Uncontrolled.   Patient on hydrochlorothiazide at home.  Added norvasc.  Hydralazine prn.    Tobacco abuse: Reportedly does not smoke at nursing facility - Cessation counseling  Hypokalemia: Resolved  GERD:  - Continue PPI  Moderate protein calorie malnutrition:  - Supp protein  History of schizoaffective disorder: Quiescent.   Tyrone Nine, MD [facility] [email or web address] 11/30/2023, 2:44 PM

## 2023-12-01 ENCOUNTER — Inpatient Hospital Stay (HOSPITAL_COMMUNITY): Payer: Medicare Other

## 2023-12-01 DIAGNOSIS — J9601 Acute respiratory failure with hypoxia: Secondary | ICD-10-CM | POA: Diagnosis not present

## 2023-12-01 DIAGNOSIS — Z7189 Other specified counseling: Secondary | ICD-10-CM | POA: Diagnosis not present

## 2023-12-01 DIAGNOSIS — Z515 Encounter for palliative care: Secondary | ICD-10-CM | POA: Diagnosis not present

## 2023-12-01 DIAGNOSIS — B338 Other specified viral diseases: Secondary | ICD-10-CM | POA: Diagnosis not present

## 2023-12-01 DIAGNOSIS — J9602 Acute respiratory failure with hypercapnia: Secondary | ICD-10-CM | POA: Diagnosis not present

## 2023-12-01 LAB — BASIC METABOLIC PANEL
Anion gap: 10 (ref 5–15)
BUN: 37 mg/dL — ABNORMAL HIGH (ref 8–23)
CO2: 37 mmol/L — ABNORMAL HIGH (ref 22–32)
Calcium: 9.3 mg/dL (ref 8.9–10.3)
Chloride: 96 mmol/L — ABNORMAL LOW (ref 98–111)
Creatinine, Ser: 0.61 mg/dL (ref 0.44–1.00)
GFR, Estimated: >60 mL/min (ref >60)
Glucose, Bld: 121 mg/dL — ABNORMAL HIGH (ref 70–99)
Potassium: 3.3 mmol/L — ABNORMAL LOW (ref 3.5–5.1)
Sodium: 143 mmol/L (ref 135–145)

## 2023-12-01 LAB — BRAIN NATRIURETIC PEPTIDE: B Natriuretic Peptide: 132.8 pg/mL — ABNORMAL HIGH (ref 0.0–100.0)

## 2023-12-01 MED ORDER — POTASSIUM CHLORIDE CRYS ER 20 MEQ PO TBCR
40.0000 meq | EXTENDED_RELEASE_TABLET | Freq: Once | ORAL | Status: AC
  Administered 2023-12-01: 40 meq via ORAL
  Filled 2023-12-01: qty 2

## 2023-12-01 NOTE — Progress Notes (Signed)
   12/01/23 0836  Oxygen Therapy/Pulse Ox  O2 Device HHFNC  O2 Therapy Oxygen humidified  Heater temperature 87.8 F (31 C) (Weaned from 34 degrees to 31 degrees.)  O2 Flow Rate (L/min) (S)  50 L/min  FiO2 (%) (S)  50 %  SpO2 95 %  Safety Instructions Yes (Comment)

## 2023-12-01 NOTE — Progress Notes (Signed)
[facility] PROGRESS NOTE  Brenda Gibson (DOB: [birth year]) WUJ:[id number] PCP: Pcp, No  Brief Narrative: Brenda Gibson is an 83 y.o. female with a history of COPD, HTN, SBO, osteoporosis who presented to the ED from SNF on 11/27/2023 with respiratory distress, cough, wheezing. She was admitted to SDU requiring BiPAP for AECOPD due to RSV infection. Overall has improved, but remains with HFNC/BiPAP. Palliative care is following.   Subjective: Had a hard night, didn't start sleeping despite medications until 5:30am, now lethargic. Required restraints.   Objective: BP (!) 171/85 (BP Location: Right Arm)   Pulse 83   Temp (!) 97.5 F (36.4 C) (Oral)   Resp 19   Ht 5\' 5"  (1.651 m)   Wt 55.1 kg   SpO2 93%   BMI 20.21 kg/m   Gen: Elderly female who is drowsy but rousable and not very attentive, nontoxic Pulm: Clear but very diminished. No crackles or wheezes. CV: RRR, no MRG or pitting edema GI: Soft, NT, ND, +BS Neuro: Incompletely oriented. No new focal deficits. Ext: Warm, no deformities. Decreased muscle bulk. Skin: No new rashes, lesions or ulcers on visualized skin   Assessment & Plan: Acute hypoxic respiratory failure with hypoxia and hypercapnia: Due to AECOPD due to RSV bronchiolitis.  - Continue to support breathing with prn BiPAP and supplemental oxygen while treating conditions below.  - Note BNP elevation (possibly chronic), though echocardiogram showed preserved LVEF and RVSF. Getting IV lasix. BNP still slightly elevated though improving, trace right pleural effusion on repeat CXR. Will continue lasix in an effort to optimize respiratory function.  - PCT negative, no consolidation noted, repeat CXR stable without infiltrate, not continued on antibiotics. - Pt's frailty, malnutrition, tenuous respiratory status with slow improvement > palliative care consulted. Will allow time for outcomes. Based on prior MOST form, remains full code, though we've not made much  headway with her oxygenation.  AECOPD due to RSV infection:  - No wheezing, will continue duonebs and steroids, though can taper dose slightly.  - Continue mucinex BID and tessalon prn.    Acute hospital delirium:  - Delirium precautions. Needs to get up OOB and try to engage, use IS, etc. during the day to maintain sleep/wake cycles. Will give zyprexa qHS to assist.   PAD:  - Continue routine vascular surgery follow up (asymptomatic at visit 10/21/2023, f/u 1 year).    Essential hypertension:  - Continue norvasc, will increase dose. - Continue hydralazine prn.    Tobacco abuse: Reportedly does not smoke at nursing facility - Cessation counseling  Hypokalemia: Repeat supplementation  Hypophosphatemia:  - Continue supplementation.  GERD:  - Continue PPI  Moderate protein calorie malnutrition:  - Supp protein  History of schizoaffective disorder: Quiescent.   Tyrone Nine, MD [facility] [email or web address] 12/01/2023, 12:56 PM

## 2023-12-01 NOTE — Progress Notes (Addendum)
SLP Cancellation Note  Patient Details Name: Brenda Gibson MRN: [id number] DOB: 25-Jul-1941   Cancelled treatment:       Reason Eval/Treat Not Completed: Other (comment) (RN reports pt with poor intake- few bites and very minimal fluid and notes indicates she is sleepy with concern for delirium, CXR negative, will continue efforts)  Palliative consulted per chart.    [name] 12/01/2023 [name], MS [facility] SLP Acute Rehab Services Office [phone] , 2:07 PM

## 2023-12-01 NOTE — Progress Notes (Signed)
Daily Progress Note   Patient Name: Brenda Gibson       Date: 12/01/2023 DOB: Apr 27, 1941  Age: 83 y.o. MRN#: [id number] Attending Physician: Tyrone Nine, MD Primary Care Physician: Pcp, No Admit Date: 11/27/2023 Length of Stay: 3 days  Reason for Consultation/Follow-up: Establishing goals of care  HPI/Patient Profile:  83 y.o. female  with past medical history of COPD, HTN, SBO, osteoporosis who presented to the ED from SNF on 11/27/2023 with respiratory distress, cough, wheezing. She was admitted on 11/27/2023 with acute hypoxic and hypercapnic respiratory failure due to acute exacerbation of COPD secondary to RSV bronchiolitis, and others.    Palliative medicine was consulted for GOC conversations.  Subjective:   Subjective: Chart Reviewed. Updates received. Patient Assessed. Created space and opportunity for patient  and family to explore thoughts and feelings regarding current medical situation.  Today's Discussion: Today saw the patient at bedside, her son was present.  Before entering the room I spoke with the nursing staff.  Noted increased confusion especially overnight requiring restraints.  She remains in restraints today, continues to fidget and pull at lines including oxygen tubing.  Oxygen needs are stable but remain high at 50 L/min / 50% FiO2 on heated high flow nasal cannula.  Staff is aware of the patient having a legal guardian.  When I saw him at the bedside the patient acknowledge me when I said hello.  She seems quite slow to respond and appears quite frail and weak.  She denies pain in general, nausea, vomiting.  The patient's son was at the bedside.  He states that she was "taken away" and assigned a legal guardian because family members that he could not take care of her although he had her for the previous 2 to 3 years.  He then says that she is in a facility now, although he is appreciative that she has somewhere stable to live.  I responded my lack of desire to get  involved with the social situation and particulars around state guardianship but rather reaffirmed my desire to ensure the patient is receiving care in line with goals and based on medical recommendations.  I stated that I am in contact with the guardian and we are discussing how the patient is doing.  We described the need for time for outcomes to see how she does.  I explained that she is sick with underlying COPD that is exacerbated by new RSV infection.  I encouraged the patient's son to wear a mask while he is in the room, although he declined.  I shared that I would continue to follow the patient to try to get an idea over how her trajectory is going with his RSV infection.  We are hopeful that she will be able to bounce back and regain familiarity, orientation, and functional status.  However, she will likely need rehab of some sort.  Fortunately the facility she lives in does also except rehab patients and this could be a possibility depending on how she does in the coming days.  Patient's son wanted to make sure I knew that he had been at the bedside and wanted me to communicate this to the guardian when I next spoke with her.  I provided emotional and general support through therapeutic listening, empathy, sharing of stories, therapeutic touch, and other techniques. I answered all questions and addressed all concerns to the best of my ability.  Limited due to condition ? confusion Review of Systems  Constitutional:  Denies pain in general  Gastrointestinal:  Negative for nausea and vomiting.    Objective:   Vital Signs:  BP (!) 159/100   Pulse 95   Temp 97.7 F (36.5 C) (Oral)   Resp 20   Ht 5\' 5"  (1.651 m)   Wt 55.1 kg   SpO2 94%   BMI 20.21 kg/m   Physical Exam Vitals and nursing note reviewed.  Constitutional:      General: She is not in acute distress.    Appearance: She is ill-appearing.     Comments: Appears frail/weak, slow responses; fidgeting and trying to  pull at lines, in restraints  HENT:     Head: Normocephalic and atraumatic.  Pulmonary:     Effort: Pulmonary effort is normal. No respiratory distress.  Abdominal:     General: Abdomen is flat.  Skin:    General: Skin is warm and dry.  Neurological:     General: No focal deficit present.     Mental Status: She is alert.  Psychiatric:        Mood and Affect: Mood normal.        Behavior: Behavior normal.     Palliative Assessment/Data: 30%    Existing Vynca/ACP Documentation: MOST form signed 10/13/23  Assessment & Plan:   Impression: Present on Admission:  Tobacco abuse  Essential hypertension, benign  Acute respiratory failure with hypoxia and hypercapnia (HCC)  COPD exacerbation (HCC)  Respiratory distress  RSV (respiratory syncytial virus pneumonia)  Malnutrition of moderate degree  83 year old female with acute presentation of chronic comorbidities as described above. The patient is quite ill with COPD exacerbation secondary to RSV bronchiolitis. She is on high flow nasal cannula right now, appears quite fatigued and does not appear to feel well. Reached out and confirmed admission with the patient's legal guardian which was just discovered today. Reviewed her current clinical situation. Overall the plan at this time is time for outcomes, make decisions as needed. I shared the palliative medicine will continue to follow. Overall long-term prognosis poor.   SUMMARY OF RECOMMENDATIONS   Remain full code Full scope of care Reach out to legal guardian (contact info and facesheet) as needed Palliative medicine will attempt to update guardian tomorrow Continue time for outcomes to guide decision making moving forward Palliative medicine will follow-up tomorrow  Symptom Management:  Per primary team PMT is available to assist as needed  Code Status: Full code  Prognosis: Unable to determine  Discharge Planning: To Be Determined  Discussed with: Patient,  patient's family, medical team, nursing team  Thank you for allowing [place] to participate in the care of Brenda Gibson PMT will continue to support holistically.  Time Total: 45 min  Detailed review of medical records (labs, imaging, vital signs), medically appropriate exam, discussed with treatment team, counseling and education to patient, family, & staff, documenting clinical information, medication management, coordination of care  Wynne Dust, NP Palliative Medicine Team  Team Phone # [phone] (Nights/Weekends)  06/30/2021, 8:17 AM

## 2023-12-02 DIAGNOSIS — J9601 Acute respiratory failure with hypoxia: Secondary | ICD-10-CM | POA: Diagnosis not present

## 2023-12-02 DIAGNOSIS — J9602 Acute respiratory failure with hypercapnia: Secondary | ICD-10-CM | POA: Diagnosis not present

## 2023-12-02 DIAGNOSIS — B338 Other specified viral diseases: Secondary | ICD-10-CM | POA: Diagnosis not present

## 2023-12-02 DIAGNOSIS — Z515 Encounter for palliative care: Secondary | ICD-10-CM | POA: Diagnosis not present

## 2023-12-02 DIAGNOSIS — Z7189 Other specified counseling: Secondary | ICD-10-CM | POA: Diagnosis not present

## 2023-12-02 LAB — PHOSPHORUS: Phosphorus: 3.7 mg/dL (ref 2.5–4.6)

## 2023-12-02 LAB — BASIC METABOLIC PANEL
Anion gap: 10 (ref 5–15)
BUN: 35 mg/dL — ABNORMAL HIGH (ref 8–23)
CO2: 38 mmol/L — ABNORMAL HIGH (ref 22–32)
Calcium: 8.8 mg/dL — ABNORMAL LOW (ref 8.9–10.3)
Chloride: 91 mmol/L — ABNORMAL LOW (ref 98–111)
Creatinine, Ser: 0.72 mg/dL (ref 0.44–1.00)
GFR, Estimated: >60 mL/min (ref >60)
Glucose, Bld: 116 mg/dL — ABNORMAL HIGH (ref 70–99)
Potassium: 2.9 mmol/L — ABNORMAL LOW (ref 3.5–5.1)
Sodium: 139 mmol/L (ref 135–145)

## 2023-12-02 LAB — MAGNESIUM: Magnesium: 2.4 mg/dL (ref 1.7–2.4)

## 2023-12-02 MED ORDER — POTASSIUM CHLORIDE 10 MEQ/100ML IV SOLN
10.0000 meq | INTRAVENOUS | Status: DC
  Administered 2023-12-02: 10 meq via INTRAVENOUS
  Filled 2023-12-02 (×2): qty 100

## 2023-12-02 MED ORDER — POTASSIUM CHLORIDE 10 MEQ/100ML IV SOLN
10.0000 meq | INTRAVENOUS | Status: AC
Start: 2023-12-02 — End: 2023-12-02
  Administered 2023-12-02 (×4): 10 meq via INTRAVENOUS
  Filled 2023-12-02 (×4): qty 100

## 2023-12-02 MED ORDER — POTASSIUM CHLORIDE 10 MEQ/100ML IV SOLN
10.0000 meq | Freq: Once | INTRAVENOUS | Status: AC
Start: 2023-12-02 — End: 2023-12-02
  Administered 2023-12-02: 10 meq via INTRAVENOUS

## 2023-12-02 NOTE — Progress Notes (Signed)
Daily Progress Note   Patient Name: Brenda Gibson       Date: 12/02/2023 DOB: May 11, 1941  Age: 83 y.o. MRN#: [id number] Attending Physician: Tyrone Nine, MD Primary Care Physician: Pcp, No Admit Date: 11/27/2023 Length of Stay: 4 days  Reason for Consultation/Follow-up: Establishing goals of care  HPI/Patient Profile:  83 y.o. female  with past medical history of COPD, HTN, SBO, osteoporosis who presented to the ED from SNF on 11/27/2023 with respiratory distress, cough, wheezing. She was admitted on 11/27/2023 with acute hypoxic and hypercapnic respiratory failure due to acute exacerbation of COPD secondary to RSV bronchiolitis, and others.    Palliative medicine was consulted for GOC conversations.  Subjective:   Subjective: Chart Reviewed. Updates received. Patient Assessed. Created space and opportunity for patient  and family to explore thoughts and feelings regarding current medical situation.  Today's Discussion: Today I saw the patient at bedside, her son was present.  Before entering the room I spoke with the nursing staff.  Noted continued confusion, especially overnight, requiring restraints.  They're attempting soft mitts when able, occasionally soft wrist restraints due to history of pulling at lines. She remains in restraints today, continues to fidget.  Oxygen needs are stable but remain high at 50 L/min / 50% FiO2 on heated high flow nasal cannula.   When I saw her at the bedside the patient was sleeping but awakened to moderate attempts with voice and touch. She seems very weak, frail, and tired. States she's very tired today. Remembers getting up with PT yesterday and denies this made her dyspneic. Denies dyspnea at rest, pain, N/V today.    The patient's son was at the bedside.  He states he's been staying the night. Notes she remains confused. Her breakfast tray is barely touched, but son states he brought food up from the cafeteria and she ate almost all of that, including  bacon and eggs. We discussed she is very ill, lungs are the biggest hurdle.  If she recovers it will be a long road, will likely require rehab. He is hopeful for her recovery and return to her current living facility.  Later in the day I called and updated the patient's legal guardian and answered questions. Palliative will continue to follow. I will ask a colleague to see her Monday and provide anther brief update to the legal guardian, I will be back Wednesday. Overall needs time for outcomes. May need reconsideration of CODE STATUS and/or extent/aggressiveness of treatment pending how she does over the coming days. Guardian expressed understanding of this.  I provided emotional and general support through therapeutic listening, empathy, sharing of stories, therapeutic touch, and other techniques. I answered all questions and addressed all concerns to the best of my ability.  Review of Systems  Constitutional:        Denies pain in general. Appears very frail and weak  Respiratory:  Negative for cough and shortness of breath.   Gastrointestinal:  Negative for abdominal pain, nausea and vomiting.    Objective:   Vital Signs:  BP 115/62   Pulse 62   Temp 97.6 F (36.4 C) (Oral)   Resp 17   Ht 5\' 5"  (1.651 m)   Wt 55.1 kg   SpO2 95%   BMI 20.21 kg/m   Physical Exam Vitals and nursing note reviewed.  Constitutional:      General: She is not in acute distress.    Appearance: She is ill-appearing.     Comments: Appears frail/weak,  slow responses; fidgeting and trying to pull at lines, in restraints  HENT:     Head: Normocephalic and atraumatic.  Cardiovascular:     Rate and Rhythm: Normal rate and regular rhythm.  Pulmonary:     Effort: Pulmonary effort is normal. No respiratory distress.     Comments: Less coughing today Abdominal:     General: Abdomen is flat.     Palpations: Abdomen is soft.  Skin:    General: Skin is warm and dry.  Neurological:     General: No focal  deficit present.     Mental Status: She is alert.  Psychiatric:        Mood and Affect: Mood normal.        Behavior: Behavior normal.    Palliative Assessment/Data: 30%    Existing Vynca/ACP Documentation: MOST form signed 10/13/23  Assessment & Plan:   Impression: Present on Admission:  Tobacco abuse  Essential hypertension, benign  Acute respiratory failure with hypoxia and hypercapnia (HCC)  COPD exacerbation (HCC)  Respiratory distress  RSV (respiratory syncytial virus pneumonia)  Malnutrition of moderate degree  83 year old female with acute presentation of chronic comorbidities as described above. The patient is quite ill with COPD exacerbation secondary to RSV bronchiolitis. She is on high flow nasal cannula right now, appears quite fatigued and does not appear to feel well.  Legal guardian updated to status and her current clinical situation. Overall the plan at this time is time for outcomes, make decisions as needed. I shared the palliative medicine will continue to follow. Overall long-term prognosis poor.   SUMMARY OF RECOMMENDATIONS   Remain full code Full scope of care Reach out to legal guardian (contact info and facesheet) as needed Okay to call guardian over the weekend for emergencies Otherwise update guardian on Monday Continue time for outcomes to guide decision making moving forward Palliative medicine will follow-up tomorrow  Symptom Management:  Per primary team PMT is available to assist as needed  Code Status: Full code  Prognosis: Unable to determine  Discharge Planning: To Be Determined  Discussed with: Patient, patient's family, medical team, nursing team, legal guardian  Thank you for allowing [place] to participate in the care of Brenda Gibson PMT will continue to support holistically.  Time Total: 45 min  Detailed review of medical records (labs, imaging, vital signs), medically appropriate exam, discussed with treatment team,  counseling and education to patient, family, & staff, documenting clinical information, medication management, coordination of care  Wynne Dust, NP Palliative Medicine Team  Team Phone # [phone] (Nights/Weekends)  06/30/2021, 8:17 AM

## 2023-12-02 NOTE — Progress Notes (Signed)
SLP Cancellation Note  Patient Details Name: Brenda Gibson MRN: [id number] DOB: 28-Sep-1941   Cancelled treatment:       Reason Eval/Treat Not Completed: Other (comment) (pt with PT, will continue efforts)  [name], MS [facility] SLP Acute Rehab Services Office [phone]   [name] 12/02/2023, 5:04 PM

## 2023-12-02 NOTE — Progress Notes (Signed)
Physical Therapy Treatment Patient Details Name: Brenda Gibson MRN: [id number] DOB: 28-Nov-1940 Today's Date: 12/02/2023   History of Present Illness Patient is a 67 year ld female who presented with  Wheezing, shortness of breath and cough. Patient was admitted with RSV, AMS, and hypercapneic/hypoxic respiratory failure. PMH: tobacco abuse, HTN, COPD,    PT Comments  The patient alert, son present. Patient tolerated   mobilizing to recliner with +1 min assistance. Patient on  HHFNC/ 50% FiO2, 50 L/min.  Spo2  >  90% .  Continue PT. Patient  should be able to return to SNF where she has resided.   If plan is discharge home, recommend the following: A little help with bathing/dressing/bathroom;Assistance with cooking/housework;Assist for transportation;Help with stairs or ramp for entrance;A lot of help with walking and/or transfers   Can travel by private vehicle        Equipment Recommendations  None recommended by PT    Recommendations for Other Services       Precautions / Restrictions Precautions Precautions: Fall Precaution Comments: monitor O2 and HR, ON HHFNC Restrictions Weight Bearing Restrictions Per Provider Order: No     Mobility  Bed Mobility   Bed Mobility: Supine to Sit     Supine to sit: Min assist     General bed mobility comments: assist with lines    Transfers Overall transfer level: Needs assistance Equipment used: 1 person hand held assist Transfers: Sit to/from Stand, Bed to chair/wheelchair/BSC Sit to Stand: Min assist   Step pivot transfers: Min assist       General transfer comment: patient able to stand and  step to recliner, with 1 HHA    Ambulation/Gait                   Stairs             Wheelchair Mobility     Tilt Bed    Modified Rankin (Stroke Patients Only)       Balance Overall balance assessment: Needs assistance Sitting-balance support: Bilateral upper extremity supported, Feet  supported Sitting balance-Leahy Scale: Fair                                      Cognition Arousal: Alert Behavior During Therapy: Flat affect Overall Cognitive Status: Difficult to assess                                 General Comments: . cooperative during session,        Exercises      General Comments        Pertinent Vitals/Pain Pain Assessment Pain Assessment: No/denies pain    Home Living                          Prior Function            PT Goals (current goals can now be found in the care plan section) Progress towards PT goals: Progressing toward goals    Frequency    Min 1X/week      PT Plan      Co-evaluation              AM-PAC PT "6 Clicks" Mobility   Outcome Measure  Help needed turning from your back to your side while in  a flat bed without using bedrails?: A Little Help needed moving from lying on your back to sitting on the side of a flat bed without using bedrails?: A Little Help needed moving to and from a bed to a chair (including a wheelchair)?: A Little Help needed standing up from a chair using your arms (e.g., wheelchair or bedside chair)?: A Little Help needed to walk in hospital room?: Total Help needed climbing 3-5 steps with a railing? : Total 6 Click Score: 14    End of Session Equipment Utilized During Treatment: Gait belt;Oxygen Activity Tolerance: Patient limited by fatigue Patient left: in chair;with call bell/phone within reach;with family/visitor present;with chair alarm set Nurse Communication: Mobility status PT Visit Diagnosis: Unsteadiness on feet (R26.81);Other abnormalities of gait and mobility (R26.89);Muscle weakness (generalized) (M62.81);Difficulty in walking, not elsewhere classified (R26.2)     Time: 1119-1140 PT Time Calculation (min) (ACUTE ONLY): 21 min  Charges:    $Therapeutic Activity: 8-22 mins PT General Charges $$ ACUTE PT VISIT: 1  Visit                     Blanchard Kelch PT Acute Rehabilitation Services Office [phone] Weekend pager-[phone]    Rada Hay 12/02/2023, 1:25 PM

## 2023-12-02 NOTE — Progress Notes (Signed)
[facility] PROGRESS NOTE  Brenda Gibson (DOB: 08/23/1941) WUJ:[id number] PCP: Pcp, No  Brief Narrative: Brenda Gibson is an 83 y.o. female with a history of COPD, HTN, SBO, osteoporosis who presented to the ED from SNF on 11/27/2023 with respiratory distress, cough, wheezing. She was admitted to SDU requiring BiPAP for AECOPD due to RSV infection. Overall has improved, but remains with HFNC/BiPAP. Palliative care is following. Remains on 50L HHF / 50% FiO2. Having acute delirium as well.   Subjective: She's more reliably interactive this morning, says she's hungry for breakfast. Short of breath she thinks is about the same. No chest pain or other complaints when asked.  Objective: BP 115/62   Pulse 62   Temp 97.6 F (36.4 C) (Oral)   Resp 17   Ht 5\' 5"  (1.651 m)   Wt 55.1 kg   SpO2 95%   BMI 20.21 kg/m   Gen: No distress elderly female, very frail Pulm: Diminished without wheezes or crackles. HHF nasal cannula in place.  CV: RRR, no MRG or edema GI: Soft, NT, ND, +BS Neuro: Alert and oriented to hospital and breathing problems as cause for admission. No new focal deficits. Ext: Warm, no deformities. Skin: No new rashes, lesions or ulcers on visualized skin   Assessment & Plan: Acute hypoxic respiratory failure with hypoxia and hypercapnia: Due to AECOPD due to RSV bronchiolitis.  - Continue to support breathing with prn BiPAP and HHFNC. Not worse over past few days, but not making progress. Will attempt to wean today, but I shared my concern for respiratory fatigue if this continues.  - Note BNP elevation (possibly chronic), though echocardiogram showed preserved LVEF and RVSF. Getting IV lasix, will hold with worsening hypokalemia and clear lungs.   - PCT negative, no consolidation noted, repeat CXR stable without infiltrate, not continued on antibiotics. - Pt's frailty, malnutrition, tenuous respiratory status with slow improvement > palliative care consulted. Will allow  time for outcomes. Based on prior MOST form, remains full code, though we've not made much headway with her oxygenation. I have shared with her that intubation is unlikely to be well tolerated.  AECOPD due to RSV infection:  - No wheezing, will continue duonebs and steroids, though can taper dose slightly.  - Continue mucinex BID and tessalon prn.    Acute hospital delirium:  - Delirium precautions. Needs to get up OOB and try to engage, use IS, etc. during the day to maintain sleep/wake cycles. Started zyprexa qHS to assist.   PAD:  - Continue routine vascular surgery follow up (asymptomatic at visit 10/21/2023, f/u 1 year).    Essential hypertension:  - Continue norvasc, increased dose. - Continue hydralazine prn.    Tobacco abuse: Reportedly does not smoke at nursing facility - Cessation counseling  Hypokalemia: - Augment supplementation thru IV today. RN reported she didn't take po meds yesterday. Will also hold today's lasix.  Hypophosphatemia:  - Continue supplementation.  GERD:  - Continue PPI  Moderate protein calorie malnutrition:  - Supp protein  History of schizoaffective disorder: Quiescent.   Tyrone Nine, MD [facility] [email or web address] 12/02/2023, 8:49 AM

## 2023-12-02 NOTE — Progress Notes (Signed)
Nutrition Follow-up  DOCUMENTATION CODES:   Non-severe (moderate) malnutrition in context of chronic illness  INTERVENTION:  - Regular diet.  - Ensure Plus High Protein po BID, each supplement provides 350 kcal and 20 grams of protein. - Magic cup TID with meals, each supplement provides 290 kcal and 9 grams of protein - Please encourage intake at all meals and of nutrition supplements. - Multivitamin with minerals daily. - Monitor weight trends.   NUTRITION DIAGNOSIS:   Moderate Malnutrition related to chronic illness (COPD) as evidenced by moderate fat depletion, severe muscle depletion. *ongoing  GOAL:   Patient will meet greater than or equal to 90% of their needs *progressing  MONITOR:   PO intake, Supplement acceptance, Weight trends  REASON FOR ASSESSMENT:   Consult Assessment of nutrition requirement/status  ASSESSMENT:   83 y.o. female with PMH of essential HTN, COPD, osteoporosis, history of small bowel obstruction who presented via EMS from skilled nursing facility with progressive worsening of shortness of breath, cough, wheezing.  Patient reports her appetite is fair and that she has been trying to eat something at all 3 meals. She is documented to have had 50% of all meals yesterday. However, RN reporting she was only taking a few bites.  Tolerating Ensure fairly well. Notes she doesn't love it but agreeable to try to drink for additional calories and protein. She also enjoys the [employer] but has not had any so far today. Encouraged intake of 3 meals a day and of nutrition supplements to aid in meeting needs and avoid weight loss during admission.  Of note, palliative care consulted and following patient.    Medications reviewed and include: Colace, Senokot, MVI  Labs reviewed:  K+ 2.9   Diet Order:   Diet Order             Diet regular Room service appropriate? Yes; Fluid consistency: Thin  Diet effective now                   EDUCATION  NEEDS:  Education needs have been addressed  Skin:  Skin Assessment: Reviewed RN Assessment  Last BM:  1/29 - type 5  Height:  Ht Readings from Last 1 Encounters:  11/28/23 5\' 5"  (1.651 m)   Weight:  Wt Readings from Last 1 Encounters:  11/28/23 55.1 kg    BMI:  Body mass index is 20.21 kg/m.  Estimated Nutritional Needs:  Kcal:  1550-1750 kcals Protein:  70-80 grams Fluid:  >/= 1.6L    Shelle Iron RD, LDN Contact via Secure Chat.

## 2023-12-02 NOTE — Progress Notes (Signed)
Pt remains on HHF [place] at 50/50 sats 93%.

## 2023-12-03 DIAGNOSIS — Z7189 Other specified counseling: Secondary | ICD-10-CM | POA: Diagnosis not present

## 2023-12-03 DIAGNOSIS — Z515 Encounter for palliative care: Secondary | ICD-10-CM | POA: Diagnosis not present

## 2023-12-03 DIAGNOSIS — J9602 Acute respiratory failure with hypercapnia: Secondary | ICD-10-CM | POA: Diagnosis not present

## 2023-12-03 DIAGNOSIS — B338 Other specified viral diseases: Secondary | ICD-10-CM | POA: Diagnosis not present

## 2023-12-03 DIAGNOSIS — J9601 Acute respiratory failure with hypoxia: Secondary | ICD-10-CM | POA: Diagnosis not present

## 2023-12-03 LAB — BASIC METABOLIC PANEL
Anion gap: 10 (ref 5–15)
BUN: 24 mg/dL — ABNORMAL HIGH (ref 8–23)
CO2: 33 mmol/L — ABNORMAL HIGH (ref 22–32)
Calcium: 8.9 mg/dL (ref 8.9–10.3)
Chloride: 98 mmol/L (ref 98–111)
Creatinine, Ser: 0.54 mg/dL (ref 0.44–1.00)
GFR, Estimated: >60 mL/min (ref >60)
Glucose, Bld: 112 mg/dL — ABNORMAL HIGH (ref 70–99)
Potassium: 4 mmol/L (ref 3.5–5.1)
Sodium: 141 mmol/L (ref 135–145)

## 2023-12-03 NOTE — Progress Notes (Signed)
Physical Therapy Treatment Patient Details Name: Brenda Gibson MRN: [id number] DOB: 02-May-1941 Today's Date: 12/03/2023   History of Present Illness Patient is a 83 year old female who presented with  Wheezing, shortness of breath and cough. Patient was admitted with RSV, AMS, and hypercapneic/hypoxic respiratory failure. PMH: tobacco abuse, HTN, COPD,    PT Comments  Pt very cooperative but requiring increased time for all activities and min assist for safe performance of tasks.  Ambulation limited further by pt requiring HHFNC.    If plan is discharge home, recommend the following: A little help with bathing/dressing/bathroom;Assistance with cooking/housework;Assist for transportation;Help with stairs or ramp for entrance;A lot of help with walking and/or transfers   Can travel by private vehicle        Equipment Recommendations  None recommended by PT    Recommendations for Other Services       Precautions / Restrictions Precautions Precautions: Fall Precaution Comments: monitor O2 and HR, ON HHFNC Restrictions Weight Bearing Restrictions Per Provider Order: No     Mobility  Bed Mobility Overal bed mobility: Needs Assistance Bed Mobility: Supine to Sit     Supine to sit: Min assist     General bed mobility comments: with increased time and A with lines.    Transfers Overall transfer level: Needs assistance Equipment used: Rolling walker (2 wheels) Transfers: Sit to/from Stand, Bed to chair/wheelchair/BSC Sit to Stand: Min assist   Step pivot transfers: Min assist       General transfer comment: patient able to stand and turn 360degrees (to accomodate heated HHFNC tubing) and  step back to recliner, with 1 HHA    Ambulation/Gait Ambulation/Gait assistance: Min assist Gait Distance (Feet): 3 Feet Assistive device: Rolling walker (2 wheels) Gait Pattern/deviations: Shuffle, Trunk flexed       General Gait Details: shuffling pattern with narrow BOS with pt  able to amb 4 to turn and negotiate way to chair   Stairs             Wheelchair Mobility     Tilt Bed    Modified Rankin (Stroke Patients Only)       Balance Overall balance assessment: Needs assistance Sitting-balance support: Bilateral upper extremity supported, Feet supported Sitting balance-Leahy Scale: Fair     Standing balance support: Single extremity supported Standing balance-Leahy Scale: Poor                              Cognition Arousal: Alert Behavior During Therapy: Flat affect Overall Cognitive Status: Difficult to assess                                 General Comments: . cooperative during session,        Exercises      General Comments General comments (skin integrity, edema, etc.): FiO2 45 % O2 40L/min during session      Pertinent Vitals/Pain Pain Assessment Pain Assessment: No/denies pain    Home Living                          Prior Function            PT Goals (current goals can now be found in the care plan section) Acute Rehab PT Goals Patient Stated Goal: to go home PT Goal Formulation: With patient Time For Goal Achievement: 12/13/23  Potential to Achieve Goals: Fair Progress towards PT goals: Progressing toward goals    Frequency    Min 1X/week      PT Plan      Co-evaluation              AM-PAC PT "6 Clicks" Mobility   Outcome Measure  Help needed turning from your back to your side while in a flat bed without using bedrails?: A Little Help needed moving from lying on your back to sitting on the side of a flat bed without using bedrails?: A Little Help needed moving to and from a bed to a chair (including a wheelchair)?: A Little Help needed standing up from a chair using your arms (e.g., wheelchair or bedside chair)?: A Little Help needed to walk in hospital room?: A Lot Help needed climbing 3-5 steps with a railing? : Total 6 Click Score: 15    End of  Session Equipment Utilized During Treatment: Gait belt;Oxygen Activity Tolerance: Patient tolerated treatment well;Patient limited by fatigue Patient left: in chair;with call bell/phone within reach;with family/visitor present;with chair alarm set Nurse Communication: Mobility status PT Visit Diagnosis: Unsteadiness on feet (R26.81);Other abnormalities of gait and mobility (R26.89);Muscle weakness (generalized) (M62.81);Difficulty in walking, not elsewhere classified (R26.2)     Time: 1610-9604 PT Time Calculation (min) (ACUTE ONLY): 24 min  Charges:    $Therapeutic Activity: 8-22 mins PT General Charges $$ ACUTE PT VISIT: 1 Visit                     Mauro Kaufmann PT Acute Rehabilitation Services Pager [phone] Office [phone]    Valleri Hendricksen 12/03/2023, 1:27 PM

## 2023-12-03 NOTE — Progress Notes (Signed)
Daily Progress Note   Patient Name: Brenda Gibson       Date: 12/03/2023 DOB: Sep 13, 1941  Age: 83 y.o. MRN#: [id number] Attending Physician: Tyrone Nine, MD Primary Care Physician: Pcp, No Admit Date: 11/27/2023 Length of Stay: 5 days  Reason for Consultation/Follow-up: Establishing goals of care  HPI/Patient Profile:  83 y.o. female  with past medical history of COPD, HTN, SBO, osteoporosis who presented to the ED from SNF on 11/27/2023 with respiratory distress, cough, wheezing. She was admitted on 11/27/2023 with acute hypoxic and hypercapnic respiratory failure due to acute exacerbation of COPD secondary to RSV bronchiolitis, and others.    Palliative medicine was consulted for GOC conversations.  Subjective:   Subjective: Chart Reviewed. Updates received. Patient Assessed. Created space and opportunity for patient  and family to explore thoughts and feelings regarding current medical situation.  Today's Discussion: Today I saw the patient at bedside, no family was present.  The nurse within the bedside giving medications and the patient seemed to be doing as well, no difficulty swallowing with pills.  After she was done I spent time talking with the patient.  She states she feels okay today, denies dyspnea.  We discussed how she got to the bedside chair yesterday and she did well with this.  We spent time talking about her life at the facility and quality of life.    She is much more awake and conversational today.  Oxygen needs are stable at 50 lpm HHFNC/50% FiO2.  Overall looks better compared to previous days.  Plans are for OT to get her out of bed into chair again shortly.  After seeing the patient and examining her I spent some time speaking with the nurse.  She feels that the patient has had minimal or at least questionable intake thus far this morning.  She agrees that she looks better compared to the past couple days when she has also had her as a patient.  We discussed the plan  to continue care, update legal guardian over the weekend for urgent or emergent situations.  Otherwise palliative medicine will touch base again early next week for further discussions on how to proceed based on how she does this weekend.  I fear that if she goes 3 to 4 days with aggressive care and continues with high oxygen needs we may have to have further discussions about CODE STATUS and options moving forward.  I provided emotional and general support through therapeutic listening, empathy, sharing of stories, therapeutic touch, and other techniques. I answered all questions and addressed all concerns to the best of my ability.  Review of Systems  Constitutional:        Denies pain in general. Appears very frail and weak  Respiratory:  Positive for cough ("Not bad"). Negative for shortness of breath.   Gastrointestinal:  Negative for abdominal pain, nausea and vomiting.    Objective:   Vital Signs:  BP (!) 121/56   Pulse (!) 55   Temp 97.6 F (36.4 C) (Oral)   Resp 18   Ht 5\' 5"  (1.651 m)   Wt 55.1 kg   SpO2 96%   BMI 20.21 kg/m   Physical Exam Vitals and nursing note reviewed.  Constitutional:      General: She is not in acute distress.    Appearance: She is ill-appearing.     Comments: Appears frail/weak, slow responses; fidgeting and trying to pull at lines, in restraints  HENT:     Head: Normocephalic  and atraumatic.  Cardiovascular:     Rate and Rhythm: Normal rate and regular rhythm.  Pulmonary:     Effort: Pulmonary effort is normal. No respiratory distress.     Breath sounds: Rales present.     Comments: Less coughing today Abdominal:     General: Abdomen is flat. Bowel sounds are normal.     Palpations: Abdomen is soft.  Skin:    General: Skin is warm and dry.  Neurological:     General: No focal deficit present.     Mental Status: She is alert.  Psychiatric:        Mood and Affect: Mood normal.        Behavior: Behavior normal.     Palliative  Assessment/Data: 30%    Existing Vynca/ACP Documentation: MOST form signed 10/13/23  Assessment & Plan:   Impression: Present on Admission:  Tobacco abuse  Essential hypertension, benign  Acute respiratory failure with hypoxia and hypercapnia (HCC)  COPD exacerbation (HCC)  Respiratory distress  RSV (respiratory syncytial virus pneumonia)  Malnutrition of moderate degree  83 year old female with acute presentation of chronic comorbidities as described above. The patient is quite ill with COPD exacerbation secondary to RSV bronchiolitis. She is on high flow nasal cannula right now, appears quite fatigued and does not appear to feel well.  Legal guardian updated to status and her current clinical situation. Overall the plan at this time is time for outcomes, make decisions as needed. I shared the palliative medicine will continue to follow. Overall long-term prognosis poor.   SUMMARY OF RECOMMENDATIONS   Remain full code Full scope of care Reach out to legal guardian (contact info and facesheet) as needed Okay to call guardian over the weekend for emergencies Otherwise update guardian on Monday Continue time for outcomes to guide decision making moving forward Continue to encourage OOB to chair as tolerated Palliative medicine will follow-up tomorrow  Symptom Management:  Per primary team PMT is available to assist as needed  Code Status: Full code  Prognosis: Unable to determine  Discharge Planning: To Be Determined  Discussed with: Patient, medical team, nursing team  Thank you for allowing [place] to participate in the care of Brenda Gibson PMT will continue to support holistically.  Time Total: 45 min  Detailed review of medical records (labs, imaging, vital signs), medically appropriate exam, discussed with treatment team, counseling and education to patient, family, & staff, documenting clinical information, medication management, coordination of care  Wynne Dust,  NP Palliative Medicine Team  Team Phone # [phone] (Nights/Weekends)  06/30/2021, 8:17 AM

## 2023-12-03 NOTE — Progress Notes (Signed)
Able to wean HHFNC system to 45 LPM and 35% for some time today. However, after PT was bathed settings have increased to 50 LPM and 50 % to maintain Sp02 MD goal >=88%. RN aware.

## 2023-12-03 NOTE — Progress Notes (Signed)
[facility] PROGRESS NOTE  Brenda Gibson (DOB: Jan 09, 1941) JXB:[id number] PCP: Pcp, No  Brief Narrative: Brenda Gibson is an 83 y.o. female with a history of COPD, HTN, SBO, osteoporosis who presented to the ED from SNF on 11/27/2023 with respiratory distress, cough, wheezing. She was admitted to SDU requiring BiPAP for AECOPD due to RSV infection. Overall has improved, but remains with HFNC/BiPAP. Palliative care is following. Remains on 50L HHF / 50% FiO2. Having acute delirium as well.   Subjective: More alert and interactive, says she doesn't feel very short of breath. Not much po intake this morning. Still with some cough, though no real complaints.   Objective: BP (!) 115/53   Pulse (!) 55   Temp 97.6 F (36.4 C) (Oral)   Resp 15   Ht 5\' 5"  (1.651 m)   Wt 55.1 kg   SpO2 99%   BMI 20.21 kg/m   Gen: Frail elderly female in no distress still on HHF Pulm: No crackles or wheezes, normal rate and effort on HHFNC, SpO2 94-95%.  CV: Regular borderline bradycardia, no MRG or edema GI: Soft, NT, ND, +BS Neuro: Alert and oriented. No new focal deficits. Ext: Warm, no deformities Skin: No rashes, lesions or ulcers on visualized skin   Assessment & Plan: Acute hypoxic respiratory failure with hypoxia and hypercapnia: Due to AECOPD due to RSV bronchiolitis.  - Continue to support breathing with prn HHFNC, no evidence of CO2 retention today, will aim for SpO2 >88% and try to wean today. If we can get her down to 15LPM, transfer out of SDU. - Note BNP elevation (possibly chronic), though echocardiogram showed preserved LVEF and RVSF. Getting IV lasix, will hold with worsening hypokalemia and clear lungs.   - PCT negative, no consolidation noted, repeat CXR stable without infiltrate, not continued on antibiotics. - Pt's frailty, malnutrition, tenuous respiratory status with slow improvement > palliative care consulted. Will allow time for outcomes. Based on prior MOST form, remains  full code, though we've not made much headway with her oxygenation. I have shared with her that intubation is unlikely to be well tolerated.  AECOPD due to RSV infection:  - No wheezing, will continue duonebs and steroids, plan taper once showing improvement.  - Continue mucinex BID and tessalon prn.    Acute hospital delirium:  - Delirium precautions. Needs to get up OOB and try to engage, use IS, etc. during the day to maintain sleep/wake cycles. Started zyprexa qHS to assist.   PAD:  - Continue routine vascular surgery follow up (asymptomatic at visit 10/21/2023, f/u 1 year).    Essential hypertension:  - Continue norvasc, increased dose. - Continue hydralazine prn.    Tobacco abuse: Reportedly does not smoke at nursing facility - Cessation counseling  Hypokalemia: - Resolved with supplementation and holding lasix, also with declining Cr in that setting.  Hypophosphatemia:  - Continue supplementation, recheck 2/2.  GERD:  - Continue PPI  Moderate protein calorie malnutrition:  - Supp protein  History of schizoaffective disorder: Quiescent.   Tyrone Nine, MD [facility] [email or web address] 12/03/2023, 11:13 AM

## 2023-12-03 NOTE — Progress Notes (Addendum)
Occupational Therapy Treatment Patient Details Name: Brenda Gibson MRN: [id number] DOB: 02-17-1941 Today's Date: 12/03/2023   History of present illness Patient is a 83 year old female who presented with  Wheezing, shortness of breath and cough. Patient was admitted with RSV, AMS, and hypercapneic/hypoxic respiratory failure. PMH: tobacco abuse, HTN, COPD,   OT comments  Patient was able to engage in therapeutic activity during session but quick to fatigue. Patient was on heated high flow during session FiO2 40% during session. Patient to transition back to SNF where she has LTC when medically stable. Patient's discharge plan remains appropriate at this time. OT will continue to follow acutely.        If plan is discharge home, recommend the following:  A little help with walking and/or transfers;A little help with bathing/dressing/bathroom;Assistance with cooking/housework;Direct supervision/assist for medications management;Assist for transportation;Help with stairs or ramp for entrance;Direct supervision/assist for financial management   Equipment Recommendations  None recommended by OT       Precautions / Restrictions Precautions Precautions: Fall Precaution Comments: monitor O2 and HR, ON HHFNC Restrictions Weight Bearing Restrictions Per Provider Order: No       Mobility Bed Mobility Overal bed mobility: Needs Assistance Bed Mobility: Supine to Sit     Supine to sit: Min assist     General bed mobility comments: with increased time and A with lines.         Balance Overall balance assessment: Needs assistance Sitting-balance support: Bilateral upper extremity supported, Feet supported Sitting balance-Leahy Scale: Fair           ADL either performed or assessed with clinical judgement   ADL Overall ADL's : Needs assistance/impaired     Grooming: Sitting;Moderate assistance Grooming Details (indicate cue type and reason): to comb hair with increased time. A  needed with HHF band around head navigation.               Lower Body Dressing Details (indicate cue type and reason): max A with patient unable to reach feet or preform figure four. Toilet Transfer: Minimal assistance;Stand-pivot;Rolling walker (2 wheels) Toilet Transfer Details (indicate cue type and reason): with increased time. patient was 91% on HHF with return to chair. needing cues for positioning to avoid wires getting tangled.   Toileting - Clothing Manipulation Details (indicate cue type and reason): patient declined to use bathroom during session.              Cognition Arousal: Alert Behavior During Therapy: Flat affect Overall Cognitive Status: Difficult to assess                                                General Comments FiO2 45 % O2 40L/min during session    Pertinent Vitals/ Pain       Pain Assessment Pain Assessment: No/denies pain         Frequency  Min 1X/week        Progress Toward Goals  OT Goals(current goals can now be found in the care plan section)  Progress towards OT goals: Progressing toward goals     Plan         AM-PAC OT "6 Clicks" Daily Activity     Outcome Measure   Help from another person eating meals?: A Little Help from another person taking care of personal grooming?: A Lot Help from  another person toileting, which includes using toliet, bedpan, or urinal?: A Lot Help from another person bathing (including washing, rinsing, drying)?: A Lot Help from another person to put on and taking off regular upper body clothing?: A Little Help from another person to put on and taking off regular lower body clothing?: A Lot 6 Click Score: 14    End of Session Equipment Utilized During Treatment: Gait belt;Rolling walker (2 wheels)  OT Visit Diagnosis: Unsteadiness on feet (R26.81);Other abnormalities of gait and mobility (R26.89)   Activity Tolerance Patient tolerated treatment well   Patient Left  in chair;with call bell/phone within reach;with chair alarm set   Nurse Communication Mobility status        Time: 8657-8469 OT Time Calculation (min): 25 min  Charges: OT General Charges $OT Visit: 1 Visit OT Treatments $Therapeutic Activity: 8-22 mins  Rosalio Loud, MS Acute Rehabilitation Department Office# [phone]   Selinda Flavin 12/03/2023, 12:30 PM

## 2023-12-03 NOTE — Plan of Care (Signed)
  Problem: Respiratory: Goal: Ability to maintain a clear airway will improve Outcome: Progressing   Problem: Education: Goal: Knowledge of General Education information will improve Description: Including pain rating scale, medication(s)/side effects and non-pharmacologic comfort measures Outcome: Progressing   Problem: Clinical Measurements: Goal: Cardiovascular complication will be avoided Outcome: Progressing   Problem: Elimination: Goal: Will not experience complications related to bowel motility Outcome: Progressing Goal: Will not experience complications related to urinary retention Outcome: Progressing   Problem: Pain Managment: Goal: General experience of comfort will improve and/or be controlled Outcome: Progressing   Problem: Safety: Goal: Non-violent Restraint(s) Outcome: Progressing   Problem: Respiratory: Goal: Ability to maintain adequate ventilation will improve Outcome: Not Progressing   Problem: Clinical Measurements: Goal: Respiratory complications will improve Outcome: Not Progressing

## 2023-12-04 DIAGNOSIS — J9601 Acute respiratory failure with hypoxia: Secondary | ICD-10-CM | POA: Diagnosis not present

## 2023-12-04 DIAGNOSIS — J9602 Acute respiratory failure with hypercapnia: Secondary | ICD-10-CM | POA: Diagnosis not present

## 2023-12-04 LAB — BASIC METABOLIC PANEL
Anion gap: 8 (ref 5–15)
BUN: 29 mg/dL — ABNORMAL HIGH (ref 8–23)
CO2: 36 mmol/L — ABNORMAL HIGH (ref 22–32)
Calcium: 9.1 mg/dL (ref 8.9–10.3)
Chloride: 98 mmol/L (ref 98–111)
Creatinine, Ser: 0.57 mg/dL (ref 0.44–1.00)
GFR, Estimated: >60 mL/min (ref >60)
Glucose, Bld: 108 mg/dL — ABNORMAL HIGH (ref 70–99)
Potassium: 3.7 mmol/L (ref 3.5–5.1)
Sodium: 142 mmol/L (ref 135–145)

## 2023-12-04 LAB — MAGNESIUM: Magnesium: 2.6 mg/dL — ABNORMAL HIGH (ref 1.7–2.4)

## 2023-12-04 LAB — PHOSPHORUS: Phosphorus: 3.1 mg/dL (ref 2.5–4.6)

## 2023-12-04 NOTE — Progress Notes (Signed)
[facility] PROGRESS NOTE  Brenda Gibson (DOB: [birth year]) NWG:[id number] PCP: Pcp, No  Brief Narrative: Brenda Gibson is an 83 y.o. female with a history of COPD, HTN, SBO, osteoporosis who presented to the ED from SNF on 11/27/2023 with respiratory distress, cough, wheezing. She was admitted to SDU requiring BiPAP for AECOPD due to RSV infection. Overall has improved, but remains with HFNC/BiPAP. Palliative care is following. Remains on 50L HHF / 50% FiO2, though weaned diurnally somewhat on 2/1. Having acute delirium as well.   Subjective: Much more interactive, says she's feeling better, more eager for breakfast. No other complaints.  Objective: BP (!) 146/57   Pulse (!) 49   Temp 97.7 F (36.5 C) (Axillary)   Resp 17   Ht 5\' 5"  (1.651 m)   Wt 55.1 kg   SpO2 96%   BMI 20.21 kg/m   Gen: Elderly female in no distress Pulm: Clear, no wheezes, diminished (stable)  CV: Regular bradycardia (BP normal/elevated, denies lightheadedness) GI: Soft, NT, ND, +BS  Neuro: Alert and oriented. No new focal deficits. Ext: Warm, no deformities. Skin: No new rashes, lesions or ulcers on visualized skin   Assessment & Plan: Acute hypoxic respiratory failure with hypoxia and hypercapnia: Due to AECOPD due to RSV bronchiolitis.  - Continue to support breathing with prn HHFNC, no evidence of CO2 retention today, will aim for SpO2 >88% and continue weaning efforts. Some progress on 2/1, will continue building on this. If we can get her down to 15LPM, transfer out of SDU. - Note BNP elevation (possibly chronic), though echocardiogram showed preserved LVEF and RVSF.   - PCT negative, no consolidation noted, repeat CXR stable without infiltrate, not continued on antibiotics. - Pt's frailty, malnutrition, tenuous respiratory status with slow improvement > palliative care consulted. Will allow time for outcomes. Based on prior MOST form, remains full code, though we've not made much headway with her  oxygenation. I have shared with her that intubation is unlikely to be well tolerated.  AECOPD due to RSV infection:  - No wheezing, will continue duonebs and steroids, plan taper once showing improvement.  - Continue mucinex BID and tessalon prn.    Acute hospital delirium:  - Delirium precautions. Needs to get up OOB and try to engage, use IS, etc. during the day to maintain sleep/wake cycles. Started zyprexa qHS to assist. This is improving overall.  PAD:  - Continue routine vascular surgery follow up (asymptomatic at visit 10/21/2023, f/u 1 year).    Essential hypertension:  - Continue norvasc, increased dose. - Continue hydralazine prn.    Tobacco abuse: Reportedly does not smoke at nursing facility - Cessation counseling  Hypokalemia: - Resolved with supplementation and holding lasix, also with declining Cr in that setting.  Hypophosphatemia: Durably resolved.  GERD:  - Continue PPI  Moderate protein calorie malnutrition:  - Supp protein  History of schizoaffective disorder: Quiescent.   Tyrone Nine, MD [facility] [email or web address] 12/04/2023, 10:55 AM

## 2023-12-04 NOTE — Plan of Care (Signed)
  Problem: Activity: Goal: Ability to tolerate increased activity will improve Outcome: Progressing   Problem: Respiratory: Goal: Ability to maintain adequate ventilation will improve Outcome: Progressing   Problem: Activity: Goal: Ability to tolerate increased activity will improve Outcome: Progressing   

## 2023-12-04 NOTE — Plan of Care (Signed)
  Problem: Education: Goal: Knowledge of disease or condition will improve Outcome: Progressing   Problem: Clinical Measurements: Goal: Ability to maintain a body temperature in the normal range will improve Outcome: Progressing   Problem: Activity: Goal: Risk for activity intolerance will decrease Outcome: Progressing   Problem: Nutrition: Goal: Adequate nutrition will be maintained Outcome: Progressing   Problem: Coping: Goal: Level of anxiety will decrease Outcome: Progressing   Problem: Elimination: Goal: Will not experience complications related to urinary retention Outcome: Progressing   Problem: Pain Managment: Goal: General experience of comfort will improve and/or be controlled Outcome: Progressing   Problem: Respiratory: Goal: Ability to maintain a clear airway will improve Outcome: Not Progressing Goal: Ability to maintain adequate ventilation will improve Outcome: Not Progressing   Problem: Clinical Measurements: Goal: Cardiovascular complication will be avoided Outcome: Not Progressing

## 2023-12-05 DIAGNOSIS — J9602 Acute respiratory failure with hypercapnia: Secondary | ICD-10-CM | POA: Diagnosis not present

## 2023-12-05 DIAGNOSIS — Z7189 Other specified counseling: Secondary | ICD-10-CM | POA: Diagnosis not present

## 2023-12-05 DIAGNOSIS — J9601 Acute respiratory failure with hypoxia: Secondary | ICD-10-CM | POA: Diagnosis not present

## 2023-12-05 DIAGNOSIS — Z515 Encounter for palliative care: Secondary | ICD-10-CM | POA: Diagnosis not present

## 2023-12-05 MED ORDER — PREDNISONE 20 MG PO TABS
40.0000 mg | ORAL_TABLET | Freq: Every day | ORAL | Status: DC
Start: 2023-12-05 — End: 2023-12-06
  Administered 2023-12-05 – 2023-12-06 (×2): 40 mg via ORAL
  Filled 2023-12-05 (×2): qty 2

## 2023-12-05 NOTE — Plan of Care (Signed)
  Problem: Activity: Goal: Ability to tolerate increased activity will improve Outcome: Progressing   Problem: Respiratory: Goal: Ability to maintain a clear airway will improve Outcome: Progressing

## 2023-12-05 NOTE — Progress Notes (Signed)
Palliative:  HPI: 83 y.o. female  with past medical history of COPD, HTN, SBO, osteoporosis who presented to the ED from SNF on 11/27/2023 with respiratory distress, cough, wheezing. She was admitted on 11/27/2023 with acute hypoxic and hypercapnic respiratory failure due to acute exacerbation of COPD secondary to RSV bronchiolitis, and others. Palliative medicine was consulted for GOC conversations.  I reviewed notes and discussed with RN. I met today with Brenda Gibson who is now on 6L oxygen! She is awake and pleasant. Poor memory but appropriate responses and conversation. I reviewed her COPD and struggles with breathing in the setting of RSV this admission. I discussed with her our fears that she may even need to be placed on a breathing machine or life support and I worry that she would not do well or recover if placed on a ventilator. I asked if this is anything she would ever want. She tells me "hmm, I don't think." She talks about just letting her go in that scenario. I asked her if she would ever want CPR/resuscitation if her heart stops recognizing that this would likely cause her pain and suffering and she tells me with more resolve that she would not want this. I reassured her that she is much improved today and I am hoping that we are heading down a better path. She has no concerns. Just says that she is tired.   I called and discussed above conversation and progress with legal guardian, Brenda Gibson. I share with Brenda Gibson my conversation with Brenda Gibson regarding intubation and resuscitation. Brenda Gibson shares that they do try and follow patient wishes. We discussed process of placing DNR given Brenda Gibson's statements to me today. I will provide Brenda Gibson with documentation of our medical recommendation for DNR and why and we will proceed. I discussed with Dr. Jarvis Newcomer who agrees with recommendation for DNR.   All questions/concerns addressed. Emotional support provided.   Exam: Frail.  Alert, oriented to person, place. Poor recall. Overall appropriate in response/conversation. No distress. Congestion cough. Abd soft. Moves all extremities.   Plan: - Patient indicates desire for DNR/DNI - will work with legal guardian and their process to put this in place  45 min  Yong Channel, NP Palliative Medicine Team Pager [phone] (Please see [email or web address] for schedule) Team Phone [phone]

## 2023-12-05 NOTE — Progress Notes (Signed)
[facility] PROGRESS NOTE  JOSEPHYNE Gibson (DOB: 19-Apr-1941) ZOX:[id number] PCP: Pcp, No  Brief Narrative: [name] is an 83 y.o. female with a history of COPD, HTN, SBO, osteoporosis who presented to the ED from SNF on 11/27/2023 with respiratory distress, cough, wheezing. She was admitted to SDU requiring BiPAP for AECOPD due to RSV infection. Overall has improved, but remains with HFNC/BiPAP. Palliative care is following. Remains on 50L HHF / 50% FiO2, though weaned diurnally somewhat on 2/1 and now to 45LPM / 35% FiO2. Hospitalization complicated by acute delirium which is overall improving.  Subjective: Remembers me, says she's been eating well and feeling better. Oxygen supplementation requirements improving.   Objective: BP (!) 142/60   Pulse (!) 53   Temp 97.8 F (36.6 C) (Oral)   Resp 15   Ht 5\' 5"  (1.651 m)   Wt 55.1 kg   SpO2 95%   BMI 20.21 kg/m   Gen: No distress, elderly and frail Pulm: No wheezes or crackles, normal rate and effort  CV: Regular bradycardia, no MRG or edema GI: Soft, NT, ND, +BS  Neuro: Alert and oriented. No new focal deficits. Ext: Warm, no deformities. Skin: No new rashes, lesions or ulcers on visualized skin   Assessment & Plan: Acute hypoxic respiratory failure with hypoxia and hypercapnia: Due to AECOPD due to RSV bronchiolitis.  - Continue supplemental oxygen with goal SpO2 >88% and continue weaning efforts, once down to 15LPM, transfer out of SDU. - Note BNP elevation (possibly chronic), though echocardiogram showed preserved LVEF and RVSF.   - PCT negative, no consolidation noted, repeat CXR stable without infiltrate, not continued on antibiotics. - Pt's frailty, malnutrition, tenuous respiratory status with slow improvement > palliative care consulted. Will allow time for outcomes. Based on prior MOST form, remains full code, though we've not made much headway with her oxygenation. I have shared with her that intubation is unlikely  to be well tolerated.  AECOPD due to RSV infection:  - No wheezing, will continue duonebs and steroids, will taper with durable resolution of wheezing. - Continue mucinex BID and tessalon prn.    Acute hospital delirium:  - Delirium precautions. Needs to get up OOB and try to engage, use IS, etc. during the day to maintain sleep/wake cycles. Started zyprexa qHS to assist. This is improving overall.  PAD:  - Continue routine vascular surgery follow up (asymptomatic at visit 10/21/2023, f/u 1 year).    Essential hypertension:  - Continue norvasc, increased dose. - Continue hydralazine prn.    Tobacco abuse: Reportedly does not smoke at nursing facility - Cessation counseling  Hypokalemia: - Resolved with supplementation and holding lasix, also with declining Cr in that setting.  Hypophosphatemia: Durably resolved.  GERD:  - Continue PPI  Moderate protein calorie malnutrition:  - Supp protein  History of schizoaffective disorder: Quiescent.   Tyrone Nine, MD [facility] [email or web address] 12/05/2023, 8:55 AM

## 2023-12-05 NOTE — Plan of Care (Signed)
  Problem: Activity: Goal: Ability to tolerate increased activity will improve Outcome: Progressing   Problem: Nutrition: Goal: Adequate nutrition will be maintained Outcome: Progressing   Problem: Coping: Goal: Level of anxiety will decrease Outcome: Progressing   Problem: Elimination: Goal: Will not experience complications related to urinary retention Outcome: Progressing   Problem: Pain Managment: Goal: General experience of comfort will improve and/or be controlled Outcome: Progressing

## 2023-12-05 NOTE — TOC Progression Note (Signed)
Transition of Care [facility]) - Progression Note   Patient Details  Name: Brenda Gibson MRN: [id number] Date of Birth: 05-Jun-1941  [facility]) CM/SW Contact  Ewing Schlein, LCSW Phone Number: 12/05/2023, 1:38 PM  Clinical Narrative: PT evaluation recommended SNF. However, due to patient's high liter flow she cannot be faxed out for SNF at this time. TOC to follow.  Barriers to Discharge: Continued Medical Work up  Social Determinants of Health (SDOH) Interventions SDOH Screenings   Food Insecurity: No Food Insecurity (11/28/2023)  Housing: Unknown (11/28/2023)  Transportation Needs: No Transportation Needs (11/28/2023)  Utilities: Not At Risk (11/28/2023)  Social Connections: Unknown (11/28/2023)  Tobacco Use: High Risk (11/27/2023)   Readmission Risk Interventions     No data to display

## 2023-12-05 NOTE — Progress Notes (Signed)
PT weaned to 6L [place] from [facility].  PT is tolerating well.  HR 61, RR 20, sats 100%.  PT denies SOB or increased WOB.

## 2023-12-06 DIAGNOSIS — Z7189 Other specified counseling: Secondary | ICD-10-CM | POA: Diagnosis not present

## 2023-12-06 DIAGNOSIS — Z515 Encounter for palliative care: Secondary | ICD-10-CM | POA: Diagnosis not present

## 2023-12-06 DIAGNOSIS — J9601 Acute respiratory failure with hypoxia: Secondary | ICD-10-CM | POA: Diagnosis not present

## 2023-12-06 DIAGNOSIS — J9602 Acute respiratory failure with hypercapnia: Secondary | ICD-10-CM | POA: Diagnosis not present

## 2023-12-06 LAB — BASIC METABOLIC PANEL
Anion gap: 8 (ref 5–15)
BUN: 23 mg/dL (ref 8–23)
CO2: 34 mmol/L — ABNORMAL HIGH (ref 22–32)
Calcium: 8.8 mg/dL — ABNORMAL LOW (ref 8.9–10.3)
Chloride: 98 mmol/L (ref 98–111)
Creatinine, Ser: 0.55 mg/dL (ref 0.44–1.00)
GFR, Estimated: >60 mL/min (ref >60)
Glucose, Bld: 94 mg/dL (ref 70–99)
Potassium: 3.6 mmol/L (ref 3.5–5.1)
Sodium: 140 mmol/L (ref 135–145)

## 2023-12-06 LAB — CBC
HCT: 42.7 % (ref 36.0–46.0)
Hemoglobin: 12.1 g/dL (ref 12.0–15.0)
MCH: 22.8 pg — ABNORMAL LOW (ref 26.0–34.0)
MCHC: 28.3 g/dL — ABNORMAL LOW (ref 30.0–36.0)
MCV: 80.6 fL (ref 80.0–100.0)
Platelets: 262 10*3/uL (ref 150–400)
RBC: 5.3 MIL/uL — ABNORMAL HIGH (ref 3.87–5.11)
RDW: 21.4 % — ABNORMAL HIGH (ref 11.5–15.5)
WBC: 12.4 10*3/uL — ABNORMAL HIGH (ref 4.0–10.5)
nRBC: 0 % (ref 0.0–0.2)

## 2023-12-06 MED ORDER — PREDNISONE 20 MG PO TABS
20.0000 mg | ORAL_TABLET | Freq: Every day | ORAL | Status: DC
  Administered 2023-12-07 – 2023-12-08 (×2): 20 mg via ORAL
  Filled 2023-12-06 (×2): qty 1

## 2023-12-06 NOTE — NC FL2 (Signed)
 [place]  MEDICAID FL2 LEVEL OF CARE FORM     IDENTIFICATION  Patient Name: Brenda Gibson Birthdate: [birth year] Sex: female Admission Date (Current Location): 11/27/2023  [facility] and [place] Number:  Producer, Television/film/video and Address:  [facility],  [address], [place] [address]      Provider Number: [id number]  Attending Physician Name and Address:  Bryn Bernardino NOVAK, MD  Relative Name and Phone Number:  Shelba Thompson(Legal Guardian) [phone]    Current Level of Care: Hospital Recommended Level of Care: Other (Comment) (LTC) Prior Approval Number:    Date Approved/Denied:   PASRR Number:    Discharge Plan: Other (Comment) (LTC)    Current Diagnoses: Patient Active Problem List   Diagnosis Date Noted   Respiratory distress 11/28/2023   RSV (respiratory syncytial virus pneumonia) 11/28/2023   Protein-calorie malnutrition, severe 10/31/2022   Closed right hip fracture, initial encounter (HCC) 10/26/2022   Constipation 10/26/2022   Pubic ramus fracture, right, closed (HCC) 10/26/2022   Grade I diastolic dysfunction 10/26/2022   COPD (chronic obstructive pulmonary disease) (HCC) 12/12/2021   Acute respiratory failure with hypoxia and hypercapnia (HCC) 12/12/2021   Partial small bowel obstruction (HCC) 05/24/2021   Acute hypoxemic respiratory failure due to COVID-19 (HCC) 05/22/2021   COPD exacerbation (HCC) 05/22/2021   COVID-19 virus infection 05/22/2021   Hyperglycemia 05/22/2021   Closed fracture of left tibial plateau 08/14/2020   Physical deconditioning 07/30/2016   Small bowel obstruction (HCC)    Hypokalemia 07/04/2016   Hypomagnesemia 07/04/2016   Urinary retention 07/04/2016   Ileus, postoperative (HCC) 07/02/2016   Malnutrition of moderate degree 06/28/2016   SBO (small bowel obstruction) s/p lysis of adhesion 06/25/2016 06/21/2016   Enteritis 06/21/2016   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   Elevated blood pressure reading without  diagnosis of hypertension 06/19/2016   Medicare annual wellness visit, subsequent 02/23/2016   Female bladder prolapse 09/03/2014   Routine general medical examination at a health care facility 08/12/2014   Osteoporosis 08/12/2014   Overactive bladder 08/02/2013   Essential hypertension, benign 06/05/2013   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   Ganglion cyst of wrist 02/22/2013   Tobacco abuse 02/24/2011    Orientation RESPIRATION BLADDER Height & Weight     Self  O2 Incontinent Weight: 55.1 kg Height:  5' 5 (165.1 cm)  BEHAVIORAL SYMPTOMS/MOOD NEUROLOGICAL BOWEL NUTRITION STATUS      Incontinent Diet (Regular)  AMBULATORY STATUS COMMUNICATION OF NEEDS Skin   Limited Assist Verbally                         Personal Care Assistance Level of Assistance  Bathing, Feeding, Dressing Bathing Assistance: Limited assistance Feeding assistance: Limited assistance Dressing Assistance: Limited assistance     Functional Limitations Info  Sight, Speech, Hearing Sight Info: Adequate Hearing Info: Adequate Speech Info: Adequate    SPECIAL CARE FACTORS FREQUENCY  PT (By licensed PT), OT (By licensed OT)     PT Frequency: 1x week OT Frequency: 1x week            Contractures Contractures Info: Not present    Additional Factors Info  Code Status, Allergies, Isolation Precautions Code Status Info: Full Allergies Info: NKA     Isolation Precautions Info: Droplet precautions     Current Medications (12/06/2023):  This is the current hospital active medication list Current Facility-Administered Medications  Medication Dose Route Frequency Provider Last Rate Last Admin   acetaminophen  (TYLENOL ) tablet  1,000 mg  1,000 mg Oral Q8H PRN Patel, Ekta V, MD       amLODipine  (NORVASC ) tablet 2.5 mg  2.5 mg Oral Daily Akula, Vijaya, MD   2.5 mg at 12/06/23 1016   aspirin  EC tablet 81 mg  81 mg Oral QHS Patel, Ekta V, MD   81 mg at 12/05/23 2302   benzonatate  (TESSALON ) capsule 200 mg   200 mg Oral BID PRN Akula, Vijaya, MD   200 mg at 12/05/23 1557   Chlorhexidine  Gluconate Cloth 2 % PADS 6 each  6 each Topical Daily Swayze, Ava, DO   6 each at 12/05/23 1102   clopidogrel  (PLAVIX ) tablet 75 mg  75 mg Oral Daily Patel, Ekta V, MD   75 mg at 12/06/23 1016   docusate sodium  (COLACE) capsule 100 mg  100 mg Oral BID Patel, Ekta V, MD   100 mg at 12/06/23 1016   feeding supplement (ENSURE ENLIVE / ENSURE PLUS) liquid 237 mL  237 mL Oral BID BM Bryn Bernardino NOVAK, MD   237 mL at 12/04/23 0915   guaiFENesin  (MUCINEX ) 12 hr tablet 600 mg  600 mg Oral BID Akula, Vijaya, MD   600 mg at 12/06/23 1016   haloperidol  lactate (HALDOL ) injection 2 mg  2 mg Intravenous Q6H PRN Akula, Vijaya, MD   2 mg at 12/02/23 1553   heparin  injection 5,000 Units  5,000 Units Subcutaneous Q12H Tobie Mario GAILS, MD   5,000 Units at 12/06/23 1230   hydrALAZINE  (APRESOLINE ) tablet 25 mg  25 mg Oral Q6H PRN Akula, Vijaya, MD   25 mg at 12/02/23 1116   HYDROcodone -acetaminophen  (NORCO/VICODIN) 5-325 MG per tablet 1-2 tablet  1-2 tablet Oral Q4H PRN Patel, Ekta V, MD   1 tablet at 11/30/23 2115   hydrOXYzine  (ATARAX ) tablet 25 mg  25 mg Oral TID PRN Akula, Vijaya, MD   25 mg at 11/30/23 2116   ipratropium-albuterol  (DUONEB) 0.5-2.5 (3) MG/3ML nebulizer solution 3 mL  3 mL Nebulization Q6H PRN Cherlyn Labella, MD   3 mL at 11/29/23 1520   lactulose  (CHRONULAC ) 10 GM/15ML solution 15 g  15 g Oral BID Cherlyn Labella, MD   15 g at 12/06/23 1017   morphine  (PF) 2 MG/ML injection 2 mg  2 mg Intravenous Q6H PRN Patel, Ekta V, MD   2 mg at 11/30/23 0155   multivitamin with minerals tablet 1 tablet  1 tablet Oral QHS Akula, Vijaya, MD   1 tablet at 12/05/23 2302   nicotine  (NICODERM CQ  - dosed in mg/24 hours) patch 14 mg  14 mg Transdermal Daily Patel, Ekta V, MD   14 mg at 12/06/23 1022   ondansetron  (ZOFRAN ) injection 4 mg  4 mg Intravenous Q6H PRN Bryn Bernardino NOVAK, MD       Oral care mouth rinse  15 mL Mouth Rinse 4 times per day Swayze,  Ava, DO   15 mL at 12/06/23 1024   Oral care mouth rinse  15 mL Mouth Rinse PRN Swayze, Ava, DO       pantoprazole  (PROTONIX ) EC tablet 40 mg  40 mg Oral QHS Bryn Bernardino B, MD   40 mg at 12/05/23 2302   [START ON 12/07/2023] predniSONE  (DELTASONE ) tablet 20 mg  20 mg Oral Q breakfast Bryn Bernardino NOVAK, MD       senna-docusate (Senokot-S) tablet 2 tablet  2 tablet Oral QHS Akula, Vijaya, MD   2 tablet at 12/05/23 2302   sodium chloride  flush (NS)  0.9 % injection 10-40 mL  10-40 mL Intracatheter PRN Cherlyn Labella, MD       sodium chloride  flush (NS) 0.9 % injection 3 mL  3 mL Intravenous Q12H Patel, Ekta V, MD   3 mL at 12/05/23 1032     Discharge Medications: Please see discharge summary for a list of discharge medications.  Relevant Imaging Results:  Relevant Lab Results:   Additional Information SS#[phone], [place], [place]

## 2023-12-06 NOTE — Progress Notes (Signed)
 [facility] PROGRESS NOTE  Brenda Gibson (DOB: [birth year]) FMW:[id number] PCP: Pcp, No  Brief Narrative: Brenda Gibson is an 83 y.o. female with a history of COPD, HTN, SBO, osteoporosis who presented to the ED from SNF on 11/27/2023 with respiratory distress, cough, wheezing. She was admitted to SDU requiring BiPAP for AECOPD due to RSV infection. The course of the next week brought slow improvements to the point that she was transferred to progressive care on 2/3 requiring 6L HFNC. Hospitalization has been complicated by acute delirium which is also quite improved. Palliative care has continued following, and we are now beginning the process to refer to SNF for post-hospitalization rehabilitation.  Subjective: She looks much better! Seen in chair having eaten nearly all of breakfast. She's breathing fine, says she's feeling better.   Objective: BP 124/63 (BP Location: Right Arm)   Pulse (!) 52   Temp 98.3 F (36.8 C) (Oral)   Resp 16   Ht 5' 5 (1.651 m)   Wt 55.1 kg   SpO2 100%   BMI 20.21 kg/m   Gen: No distress, elderly female Pulm: Nonlabored on 6L without crackles or wheezes  CV: Regular bradycardia, no JVD or edema GI: Soft, NT, ND, +BS Neuro: Alert and oriented. No new focal deficits. Ext: Warm, no deformities. Skin: No new rashes, lesions or ulcers on visualized skin   Assessment & Plan: Acute hypoxic respiratory failure with hypoxia and hypercapnia: Due to AECOPD due to RSV bronchiolitis.  - Continue supplemental oxygen with goal SpO2 >88% and continue weaning efforts. Much of her improvement of late has been likely due to mobilizing her and using IS. Will reorder IS, d/w pt and RN to continue using this.  - Note BNP elevation (possibly chronic), though echocardiogram showed preserved LVEF and RVSF.   - PCT negative, no consolidation noted, repeat CXR stable without infiltrate, not continued on antibiotics. - Pt's frailty, malnutrition, tenuous respiratory status  with slow improvement > palliative care consulted. Will allow time for outcomes. Based on prior MOST form, remains full code, though we've not made much headway with her oxygenation. I have shared with her that intubation is unlikely to be well tolerated.  AECOPD due to RSV infection:  - No wheezing, will continue duonebs, tapered steroids to prednisone  40mg , should be able to taper further going forward, will decrease to 20mg  starting 2/5.   - Continue mucinex  BID and tessalon  prn.    Acute hospital delirium:  - Delirium precautions. Needs to get up OOB and try to engage, use IS, etc. during the day to maintain sleep/wake cycles.   PAD:  - Continue routine vascular surgery follow up (asymptomatic at visit 10/21/2023, f/u 1 year).    Essential hypertension:  - Continue norvasc , increased dose. - Continue hydralazine  prn.    Tobacco abuse: Reportedly does not smoke at nursing facility - Cessation counseling  Hypokalemia: - Resolved with supplementation and holding lasix , also with declining Cr in that setting.  Hypophosphatemia: Durably resolved.  GERD:  - Continue PPI  Moderate protein calorie malnutrition:  - Supp protein  History of schizoaffective disorder: Quiescent.   Bernardino KATHEE Come, MD [facility] [email or web address] 12/06/2023, 8:22 AM

## 2023-12-06 NOTE — TOC Progression Note (Addendum)
 Transition of Care [facility]) - Progression Note    Patient Details  Name: Brenda Gibson MRN: [id number] Date of Birth: 01-23-1941  [facility] CM/SW Contact  Kilian Schwartz, [place], RN Phone Number: 12/06/2023, 10:38 AM  Clinical Narrative:  Will confirm w/[facility] w/[facility] rep PTA & current recc [facility] SNF;will confirm 02 n/c level prior fl2.  -3:50p from [facility]-LTC for return per Legal guardian Adrienne & rep [facility].    Expected Discharge Plan: Skilled Nursing Facility Barriers to Discharge: Continued Medical Work up  Expected Discharge Plan and Services                                               Social Determinants of Health (SDOH) Interventions SDOH Screenings   Food Insecurity: No Food Insecurity (11/28/2023)  Housing: Unknown (11/28/2023)  Transportation Needs: No Transportation Needs (11/28/2023)  Utilities: Not At Risk (11/28/2023)  Social Connections: Unknown (11/28/2023)  Tobacco Use: High Risk (11/27/2023)    Readmission Risk Interventions     No data to display

## 2023-12-06 NOTE — Progress Notes (Addendum)
 Palliative:  HPI: 83 y.o. female  with past medical history of COPD, HTN, SBO, osteoporosis who presented to the ED from SNF on 11/27/2023 with respiratory distress, cough, wheezing. She was admitted on 11/27/2023 with acute hypoxic and hypercapnic respiratory failure due to acute exacerbation of COPD secondary to RSV bronchiolitis, and others. Palliative medicine was consulted for GOC conversations.   I met today with Brenda Gibson. Son is at bedside but leaves as NT is getting Brenda Gibson back to bed. He expresses that he is happy that she is finally improving. Brenda Gibson is awake and is able to walk with assist from NT. She reports she is feeling better and has no concerns.   I completed DNR recommendation and sent to legal guardian as previously discussed yesterday. Patient has indicated desire for DNR/DNI in conversation with me yesterday. She would not be expected to recover from resuscitation event therefore DNR/DNI recommended to protect her from unnecessary pain and suffering at the end of her life. I received back from legal guardian form to complete for DNR review for her leadership team. I assisted with obtaining notary and form being signed and completed by Dr. Bryn and Dr. Jeryl. I sent forms to DSS legal guardian, Shelba Hummer. Will await for review and final decision. She is nearing medical stability for discharge. May proceed with discharge when medically appropriate. Recommend outpatient palliative to follow up on goals and guardian for final decision.   Exam: Frail. Alert, oriented to person, place. Poor recall. Overall appropriate in response/conversation. No distress. Congestion cough. Abd soft. Moves all extremities.   Plan: - DNR recommendation forms completed as required by DSS. They are under review.  - Recommend outpatient palliative follow up.     50 min  Bernarda Kitty, NP Palliative Medicine Team Pager [phone] (Please see [email or web address] for schedule) Team Phone  [phone]

## 2023-12-06 NOTE — Plan of Care (Signed)
  Problem: Respiratory: Goal: Ability to maintain a clear airway will improve Outcome: Progressing Goal: Levels of oxygenation will improve Outcome: Progressing Goal: Ability to maintain adequate ventilation will improve Outcome: Progressing   Problem: Clinical Measurements: Goal: Ability to maintain a body temperature in the normal range will improve Outcome: Progressing   Problem: Respiratory: Goal: Ability to maintain adequate ventilation will improve Outcome: Progressing Goal: Ability to maintain a clear airway will improve Outcome: Progressing

## 2023-12-06 NOTE — Progress Notes (Signed)
 Physical Therapy Treatment Patient Details Name: Brenda Gibson MRN: [id number] DOB: [birth year] Today's Date: 12/06/2023   History of Present Illness Patient is a 83 year old female who presented with  Wheezing, shortness of breath and cough. Patient was admitted with RSV, AMS, and hypercapneic/hypoxic respiratory failure. PMH: tobacco abuse, HTN, COPD,    PT Comments  Pt agreeable to working with therapy. Remained on [place] O2 during session-on 6L for ambulation. O2 > or = 90% with ambulation. Plan is for return to SNF where pt is LTC resident.     If plan is discharge home, recommend the following: A little help with bathing/dressing/bathroom;Assistance with cooking/housework;Assist for transportation;Help with stairs or ramp for entrance;A lot of help with walking and/or transfers   Can travel by private vehicle        Equipment Recommendations  None recommended by PT    Recommendations for Other Services       Precautions / Restrictions Precautions Precautions: Fall Precaution Comments: monitor O2 and HR, ON HHFNC Restrictions Weight Bearing Restrictions Per Provider Order: No     Mobility  Bed Mobility Overal bed mobility: Needs Assistance Bed Mobility: Sit to Supine       Sit to supine: Contact guard assist   General bed mobility comments: with increased time and A with lines.    Transfers Overall transfer level: Needs assistance Equipment used: Rolling walker (2 wheels) Transfers: Sit to/from Stand Sit to Stand: Min assist           General transfer comment: Cues for safety, hand placement. Assist to steady.    Ambulation/Gait Ambulation/Gait assistance: Min assist Gait Distance (Feet): 15 Feet Assistive device: Rolling walker (2 wheels) Gait Pattern/deviations: Step-through pattern, Decreased stride length       General Gait Details: Intermittent Min A. O2 > or =90% on 6L [place]. Pt tolerated activity fairly well. Dyspnea 2/4.   Stairs              Wheelchair Mobility     Tilt Bed    Modified Rankin (Stroke Patients Only)       Balance Overall balance assessment: Needs assistance         Standing balance support: Bilateral upper extremity supported, Reliant on assistive device for balance Standing balance-Leahy Scale: Poor                              Cognition Arousal: Alert Behavior During Therapy: Flat affect Overall Cognitive Status: Difficult to assess                                 General Comments: . cooperative during session,        Exercises      General Comments        Pertinent Vitals/Pain Pain Assessment Pain Assessment: No/denies pain    Home Living                          Prior Function            PT Goals (current goals can now be found in the care plan section) Progress towards PT goals: Progressing toward goals    Frequency    Min 1X/week      PT Plan      Co-evaluation  AM-PAC PT 6 Clicks Mobility   Outcome Measure  Help needed turning from your back to your side while in a flat bed without using bedrails?: A Little Help needed moving from lying on your back to sitting on the side of a flat bed without using bedrails?: A Little Help needed moving to and from a bed to a chair (including a wheelchair)?: A Little Help needed standing up from a chair using your arms (e.g., wheelchair or bedside chair)?: A Little Help needed to walk in hospital room?: A Little Help needed climbing 3-5 steps with a railing? : A Lot 6 Click Score: 17    End of Session Equipment Utilized During Treatment: Oxygen Activity Tolerance: Patient tolerated treatment well Patient left: in bed;with call bell/phone within reach;with bed alarm set   PT Visit Diagnosis: Unsteadiness on feet (R26.81);Other abnormalities of gait and mobility (R26.89);Muscle weakness (generalized) (M62.81);Difficulty in walking, not elsewhere classified  (R26.2)     Time: 1530-1540 PT Time Calculation (min) (ACUTE ONLY): 10 min  Charges:    $Gait Training: 8-22 mins PT General Charges $$ ACUTE PT VISIT: 1 Visit                         Dannial SQUIBB, PT Acute Rehabilitation  Office: [phone]

## 2023-12-07 ENCOUNTER — Inpatient Hospital Stay (HOSPITAL_COMMUNITY): Payer: Medicare Other

## 2023-12-07 DIAGNOSIS — F0390 Unspecified dementia without behavioral disturbance: Secondary | ICD-10-CM | POA: Insufficient documentation

## 2023-12-07 DIAGNOSIS — L8990 Pressure ulcer of unspecified site, unspecified stage: Secondary | ICD-10-CM

## 2023-12-07 DIAGNOSIS — I739 Peripheral vascular disease, unspecified: Secondary | ICD-10-CM | POA: Insufficient documentation

## 2023-12-07 DIAGNOSIS — G9341 Metabolic encephalopathy: Secondary | ICD-10-CM | POA: Insufficient documentation

## 2023-12-07 DIAGNOSIS — J9601 Acute respiratory failure with hypoxia: Secondary | ICD-10-CM | POA: Diagnosis not present

## 2023-12-07 DIAGNOSIS — J9602 Acute respiratory failure with hypercapnia: Secondary | ICD-10-CM | POA: Diagnosis not present

## 2023-12-07 MED ORDER — MOMETASONE FURO-FORMOTEROL FUM 100-5 MCG/ACT IN AERO
2.0000 | INHALATION_SPRAY | Freq: Two times a day (BID) | RESPIRATORY_TRACT | Status: DC
  Administered 2023-12-08: 2 via RESPIRATORY_TRACT
  Filled 2023-12-07: qty 8.8

## 2023-12-07 NOTE — Assessment & Plan Note (Signed)
 Continue nicotine patch

## 2023-12-07 NOTE — Progress Notes (Signed)
  Progress Note   Patient: Brenda Gibson FMW:[id number] DOB: [birth year] DOA: 11/27/2023     9 DOS: the patient was seen and examined on 12/07/2023 at 11:05 AM      Brief hospital course: [name] with COPD not on home O2, HTN who presented from SNF with respiratory distress, wheezing, admitted to SDU on BiPAP for COPD.  RSV+, slow to wean O2 and hospitalization complicated by delirium.     Assessment and Plan: * Acute respiratory failure with hypoxia and hypercapnia due to COPD exacerbation due to RSV pneumonia Patient was admitted on BiPAP in stepdown.  Has weaned down to 2 L gradually. Today is able to maintain sats on room air at rest, desaturates with exertion Echocardiogram showed preserved EF - Continue prednisone  taper - Continue DuoNebs - Continue Tessalon , Mucinex  -Start ICS/LABA   Acute metabolic encephalopathy Dementia Mentation is gradually improving to her baseline - Delirium precautions    Essential hypertension, benign Hypertensive urgency BP controlled - Continue amlodipine , PRN hydralazine   Tobacco abuse - Nicotine  patch  Peripheral vascular disease - Continue aspirin , Plavix  - Resume Lipitor at discharge  Hypokalemia Hypophosphatemia Supplemented  Moderate protein calorie malnutrition - Continue nutrition supplements          Subjective: The patient has had no obvious clinical change, she still tired, she still wheezing.  She is not agitated or febrile.  Nursing have no specific concerns.  Son is at the bedside, wondering when she will be able to go back to rehab.     Physical Exam: BP (!) 134/58   Pulse 65   Temp 97.6 F (36.4 C)   Resp 20   Ht 5' 5 (1.651 m)   Wt 55.1 kg   SpO2 98%   BMI 20.21 kg/m   Elderly adult female, sitting up in recliner, leaning forward, appears tired, does not make eye contact RRR, no murmurs, no peripheral edema Respiratory rate seems increased, she is tight bilaterally, after nebulizer, she is  slightly more air movement, coarse, no obvious wheezes or rales Abdomen soft without tenderness palpation or guarding, no ascites or distention Attention diminished, affect blunted, she does not respond to questions, she is oriented to self only, she has generalized weakness but symmetric strength    Data Reviewed: Basic metabolic panel and CBC were normal yesterday   Family Communication: Son at the bedside    Disposition: Status is: Inpatient         Author: Lonni SHAUNNA Dalton, MD 12/07/2023 4:19 PM  For on call review [email or web address].

## 2023-12-07 NOTE — Consult Note (Signed)
 WOC Nurse Consult Note: Reason for Consult: prevalence data collection team discovered heel PI and reported ear PI Wound type: Deep Tissue Pressure Injury: right heel; 3cm x 3cm x 0cm ; 100% dark purple, non blanchable  Stage 2 left ear auricle; medical device related; pink, clean Pressure Injury POA: No Measurement: see above; see nursing flow sheet for ear wound Wound bed:see above  Drainage (amount, consistency, odor) none Periwound: intact  Dressing procedure/placement/frequency: Add Prevalon boots bilaterally to offload heels at all times Silicone foam to both heels, change every 3 days Silicone foam to the left ear wound, change every 3 days  WOC Nurse team will follow with you and see patient within 10 days for wound assessments.  Please notify WOC nurses of any acute changes in the wounds or any new areas of concern Qamar Aughenbaugh [facility] MSN, RN,CWOCN, CNS, CWON-AP [phone]

## 2023-12-07 NOTE — Progress Notes (Signed)
 Occupational Therapy Treatment Patient Details Name: Brenda Gibson MRN: [id number] DOB: 06-24-1941 Today's Date: 12/07/2023   History of present illness Patient is a 83 year old female who presented with  Wheezing, shortness of breath and cough. Patient was admitted with RSV, AMS, and hypercapneic/hypoxic respiratory failure. PMH: tobacco abuse, HTN, COPD,   OT comments  Pt seen for skilled OT session. Pt on RA upon OT arrival as per nursing and was sleeping and O2 was 86% on RA. Applied 2 ltrs O2 and then once sitting EOB we removed again and patient remained 94-95% on RA.. Educated pt and son on importance of OOB with + understanding verbalized. Pt progressing towards acute care OT goals and continues to benefit from ongoing therapy while on unit. Patient will benefit from continued inpatient follow up therapy, <3 hours/day       If plan is discharge home, recommend the following:  A little help with walking and/or transfers;A little help with bathing/dressing/bathroom;Assistance with cooking/housework;Direct supervision/assist for medications management;Assist for transportation;Help with stairs or ramp for entrance;Direct supervision/assist for financial management   Equipment Recommendations  None recommended by OT       Precautions / Restrictions Precautions Precautions: Fall Restrictions Weight Bearing Restrictions Per Provider Order: No       Mobility Bed Mobility Overal bed mobility: Needs Assistance Bed Mobility: Sit to Supine     Supine to sit: Min assist Sit to supine: Contact guard assist   General bed mobility comments: with increased time and A with lines.    Transfers Overall transfer level: Needs assistance Equipment used: Rolling walker (2 wheels) Transfers: Sit to/from Stand Sit to Stand: Min assist     Step pivot transfers: Min assist     General transfer comment: Cues for safety, hand placement and deceleration     Balance Overall balance  assessment: Needs assistance Sitting-balance support: No upper extremity supported Sitting balance-Leahy Scale: Fair     Standing balance support: Bilateral upper extremity supported, Reliant on assistive device for balance Standing balance-Leahy Scale: Poor                         ADL either performed or assessed with clinical judgement   ADL Overall ADL's : Needs assistance/impaired Eating/Feeding: Set up;Supervision/ safety Eating/Feeding Details (indicate cue type and reason): cup to mouth x 3 for coffee Grooming: Minimal assistance;Cueing for sequencing Grooming Details (indicate cue type and reason): set up and min A for hair combing and face washing EOB and recliner level Upper Body Bathing: Minimal assistance;Sitting               Toilet Transfer: Minimal assistance;Stand-pivot;Rolling walker (2 wheels) Toilet Transfer Details (indicate cue type and reason): with increased time and cues for RW navigation especially in tight spaces, and hand placement on grab bar, patient was on RA upon OT arrival as per nursing removing O2 prior to OT session. Pt sleepy and OT assessed O2 and Sat was 86% on RA. Applied 2 ltrs O2 and then once sitting EOB, removed again and pt remained 94-95% on RA once in recliner   Toileting - Clothing Manipulation Details (indicate cue type and reason): patient declined to use bathroom during session but open to transfer training       General ADL Comments: son in room and encouraging along with OT to remain OOB and participate in light BADL's and mobility     Cognition Arousal: Lethargic, Alert (sleepy intermittently, more alert with mobility  and OOB to recliner) Behavior During Therapy: Flat affect                                   General Comments: . cooperative during session,                   Pertinent Vitals/ Pain       Pain Assessment Pain Assessment: No/denies pain   Frequency  Min 1X/week         Progress Toward Goals  OT Goals(current goals can now be found in the care plan section)  Progress towards OT goals: Progressing toward goals  Acute Rehab OT Goals Potential to Achieve Goals: Fair         AM-PAC OT 6 Clicks Daily Activity     Outcome Measure   Help from another person eating meals?: A Little Help from another person taking care of personal grooming?: A Lot Help from another person toileting, which includes using toliet, bedpan, or urinal?: A Lot Help from another person bathing (including washing, rinsing, drying)?: A Lot Help from another person to put on and taking off regular upper body clothing?: A Little Help from another person to put on and taking off regular lower body clothing?: A Lot 6 Click Score: 14    End of Session Equipment Utilized During Treatment: Gait belt;Rolling walker (2 wheels)  OT Visit Diagnosis: Unsteadiness on feet (R26.81);Other abnormalities of gait and mobility (R26.89);Muscle weakness (generalized) (M62.81)   Activity Tolerance Patient tolerated treatment well   Patient Left in chair;with call bell/phone within reach;with chair alarm set;with family/visitor present   Nurse Communication Other (comment);Mobility status (O2 Sats response)        Time: 1346-1410 OT Time Calculation (min): 24 min  Charges: OT General Charges $OT Visit: 1 Visit OT Treatments $Self Care/Home Management : 23-37 mins  Chaquana Nichols OT/L Acute Rehabilitation Department  [phone]  12/07/2023, 3:02 PM

## 2023-12-07 NOTE — Progress Notes (Signed)
 Palliative:  Approval from DSS legal guardian for DNR status received - hard copy placed in shadow chart. Code status changed in electronic chart and DNR form signed.   No charge  Bernarda Kitty, NP Palliative Medicine Team Pager [phone] (Please see [email or web address] for schedule) Team Phone [phone]

## 2023-12-07 NOTE — Assessment & Plan Note (Signed)
 Due to COPD exacerbation due to RSV pneumonia Patient was admitted on BiPAP in stepdown.  Has weaned down to 2 L gradually. Today is able to maintain sats on room air at rest, desaturates with exertion Echocardiogram showed preserved EF - Continue prednisone  taper - Continue DuoNebs - Continue Tessalon , Mucinex  - Continue ICS/LABA

## 2023-12-07 NOTE — Assessment & Plan Note (Signed)
-   Continue nutrition supplements

## 2023-12-07 NOTE — Assessment & Plan Note (Signed)
-   Continue aspirin , Plavix  - Resume Lipitor at discharge

## 2023-12-07 NOTE — Assessment & Plan Note (Signed)
 Dementia Mentation is gradually improving to her baseline - Delirium precautions

## 2023-12-07 NOTE — Assessment & Plan Note (Signed)
Hypertensive urgency BP controlled - Continue amlodipine, PRN hydralazine

## 2023-12-07 NOTE — Progress Notes (Signed)
 Speech Language Pathology Treatment: Dysphagia  Patient Details Name: ENIYAH EASTMOND MRN: [id number] DOB: April 27, 1941 Today's Date: 12/07/2023 Time: 8444-8394 SLP Time Calculation (min) (ACUTE ONLY): 10 min  Assessment / Plan / Recommendation Clinical Impression  Pt seen for slp follow up regarding her swallow function.  Last CXR 1/30 results Trace right pleural effusion. 2. Aortic atherosclerosis. - Repeat CXR ordered today.    Pt today has been able to have her oxygen titrated per RN. She was on 5 liter of oxygen and has decreased to 2 liters - was on RA some as well per RN.    Pt sleepy upon greeting her and slid down in the bed but did easily awaken and willing to work with this SLP.  She denies having any issues with reflux, dysphagia - does admit to occasional issues with swallowing pills - and prefers them with applesauce.     SLP administered 3 ounce yale swallow screen - however pt unable to complete exam due to requiring rest break for respiratory support but no indication of aspiration.   Due to risk of episodic aspiration from impairment of reciprocity of swallow/breathing wither her COPD, pt educated to importance of taking rest breaks if dyspneic.     SLP will sign off, advised RN that pt wants to continue medications whole with applesauce for ease of swallowing.           HPI HPI: 83yo female admitted from SNF 11/27/23 with respiratory distress 2/2 RSV. PMH: HTN, COPD, osteoporosis, SBO, malnutrition. CXR = shows patchy opacities in the right costophrenic angle - atelectasis vs infiltrate      SLP Plan  All goals met      Recommendations for follow up therapy are one component of a multi-disciplinary discharge planning process, led by the attending physician.  Recommendations may be updated based on patient status, additional functional criteria and insurance authorization.    Recommendations  Diet recommendations: Regular;Thin liquid (may want to choose softer  foods) Liquids provided via: Straw Medication Administration:  (whole with applesauce per pt request) Supervision: Patient able to self feed Compensations: Slow rate;Small sips/bites Postural Changes and/or Swallow Maneuvers: Seated upright 90 degrees;Upright 30-60 min after meal (pt has h/o GERD)                  Oral care BID           All goals met     Nicolas Emmie Caldron  12/07/2023, 5:02 PM

## 2023-12-08 ENCOUNTER — Other Ambulatory Visit (HOSPITAL_COMMUNITY): Payer: Self-pay

## 2023-12-08 DIAGNOSIS — J9601 Acute respiratory failure with hypoxia: Secondary | ICD-10-CM | POA: Diagnosis not present

## 2023-12-08 DIAGNOSIS — J9602 Acute respiratory failure with hypercapnia: Secondary | ICD-10-CM | POA: Diagnosis not present

## 2023-12-08 LAB — COMPREHENSIVE METABOLIC PANEL
ALT: 23 U/L (ref 0–44)
AST: 17 U/L (ref 15–41)
Albumin: 2.8 g/dL — ABNORMAL LOW (ref 3.5–5.0)
Alkaline Phosphatase: 45 U/L (ref 38–126)
Anion gap: 10 (ref 5–15)
BUN: 21 mg/dL (ref 8–23)
CO2: 32 mmol/L (ref 22–32)
Calcium: 9.1 mg/dL (ref 8.9–10.3)
Chloride: 96 mmol/L — ABNORMAL LOW (ref 98–111)
Creatinine, Ser: 0.56 mg/dL (ref 0.44–1.00)
GFR, Estimated: >60 mL/min (ref >60)
Glucose, Bld: 141 mg/dL — ABNORMAL HIGH (ref 70–99)
Potassium: 3.4 mmol/L — ABNORMAL LOW (ref 3.5–5.1)
Sodium: 138 mmol/L (ref 135–145)
Total Bilirubin: 0.6 mg/dL (ref 0.0–1.2)
Total Protein: 5.5 g/dL — ABNORMAL LOW (ref 6.5–8.1)

## 2023-12-08 LAB — CBC
HCT: 37.1 % (ref 36.0–46.0)
Hemoglobin: 10.4 g/dL — ABNORMAL LOW (ref 12.0–15.0)
MCH: 22.6 pg — ABNORMAL LOW (ref 26.0–34.0)
MCHC: 28 g/dL — ABNORMAL LOW (ref 30.0–36.0)
MCV: 80.7 fL (ref 80.0–100.0)
Platelets: 254 10*3/uL (ref 150–400)
RBC: 4.6 MIL/uL (ref 3.87–5.11)
RDW: 21.5 % — ABNORMAL HIGH (ref 11.5–15.5)
WBC: 11.4 10*3/uL — ABNORMAL HIGH (ref 4.0–10.5)
nRBC: 0 % (ref 0.0–0.2)

## 2023-12-08 MED ORDER — PREDNISONE 10 MG PO TABS: ORAL_TABLET | ORAL | Status: DC

## 2023-12-08 MED ORDER — AMLODIPINE BESYLATE 2.5 MG PO TABS: 2.5000 mg | ORAL_TABLET | Freq: Every day | ORAL | Status: AC

## 2023-12-08 MED ORDER — POTASSIUM CHLORIDE CRYS ER 20 MEQ PO TBCR
40.0000 meq | EXTENDED_RELEASE_TABLET | Freq: Once | ORAL | Status: AC
  Administered 2023-12-08: 40 meq via ORAL
  Filled 2023-12-08: qty 2

## 2023-12-08 MED ORDER — MOMETASONE FURO-FORMOTEROL FUM 100-5 MCG/ACT IN AERO: 2.0000 | INHALATION_SPRAY | Freq: Two times a day (BID) | RESPIRATORY_TRACT | Status: AC

## 2023-12-08 MED ORDER — ENSURE ENLIVE PO LIQD: 237.0000 mL | Freq: Two times a day (BID) | ORAL | Status: AC

## 2023-12-08 MED ORDER — BENZONATATE 200 MG PO CAPS
200.0000 mg | ORAL_CAPSULE | Freq: Two times a day (BID) | ORAL | 0 refills | Status: AC | PRN
  Filled 2023-12-08: qty 20, 10d supply, fill #0

## 2023-12-08 MED ORDER — IPRATROPIUM-ALBUTEROL 0.5-2.5 (3) MG/3ML IN SOLN: 3.0000 mL | Freq: Four times a day (QID) | RESPIRATORY_TRACT | Status: AC | PRN

## 2023-12-08 NOTE — Progress Notes (Signed)
 SATURATION QUALIFICATIONS: (This note is used to comply with regulatory documentation for home oxygen)  Patient Saturations on Room Air at Rest = 93%  Patient Saturations on Room Air while Ambulating = 87%  Patient Saturations on 2 Liters of oxygen while Ambulating = 91%  Please briefly explain why patient needs home oxygen: Desats with ambulation

## 2023-12-08 NOTE — Progress Notes (Signed)
 Physical Therapy Treatment Patient Details Name: Brenda Gibson MRN: [id number] DOB: [birth year] Today's Date: 12/08/2023   History of Present Illness Patient is a 83 year old female who presented with  Wheezing, shortness of breath and cough. Patient was admitted with RSV, AMS, and hypercapneic/hypoxic respiratory failure. PMH: tobacco abuse, HTN, COPD,    PT Comments  Pt supine in bed, agreeable to therapy. Pt on RA with SpO2 90% while supine, desat to 89% when transitioning to EOB, cued pt on pursed lip breathing and able to improve to 92%. Pt requesting physical assist to power up, but able to with CGA and RW, verbal cues for hand placement. Pt ambualtes in room with RW, forward flexed trunk, step through gait pattern, slow, on RA with SpO2 88-92% with waveform poor at times, cued pt for pursed lip breathing; SpO2 90% once return to sitting in recliner. Son at bedside offering strong encouragement. Attempted 2nd ambulation after seated rest but pt politely declines de to fatigue. Pt on RA with SpO2 92% and EOS- RN notified.   If plan is discharge home, recommend the following: A little help with bathing/dressing/bathroom;Assist for transportation;A little help with walking and/or transfers   Can travel by private vehicle     Yes  Equipment Recommendations  None recommended by PT    Recommendations for Other Services       Precautions / Restrictions Precautions Precautions: Fall Restrictions Weight Bearing Restrictions Per Provider Order: No     Mobility  Bed Mobility Overal bed mobility: Needs Assistance Bed Mobility: Supine to Sit     Supine to sit: Contact guard     General bed mobility comments: increased time and effort, no physical assistance    Transfers Overall transfer level: Needs assistance Equipment used: Rolling walker (2 wheels) Transfers: Sit to/from Stand Sit to Stand: Contact guard assist           General transfer comment: verbal cues for hand  placement, pt requests boost up but not needing one, CGA for steadying with rising    Ambulation/Gait Ambulation/Gait assistance: Contact guard assist Gait Distance (Feet): 15 Feet Assistive device: Rolling walker (2 wheels) Gait Pattern/deviations: Step-through pattern, Decreased stride length, Trunk flexed Gait velocity: decreased     General Gait Details: slow, step through gait pattern, trunk forward flexed, cues for body positioning wtihin RW frame, dyspnea noted and on RA with SpO2 88-92%, cues for pursed lip breathing   Stairs             Wheelchair Mobility     Tilt Bed    Modified Rankin (Stroke Patients Only)       Balance Overall balance assessment: Needs assistance Sitting-balance support: No upper extremity supported Sitting balance-Leahy Scale: Fair Sitting balance - Comments: kyphotic seated posture EOB   Standing balance support: Bilateral upper extremity supported, Reliant on assistive device for balance, During functional activity Standing balance-Leahy Scale: Poor                              Cognition Arousal: Alert Behavior During Therapy: Flat affect Overall Cognitive Status: Within Functional Limits for tasks assessed                                 General Comments: pt not conversational, follows commands, son present offering strong encouragement        Exercises  General Comments General comments (skin integrity, edema, etc.): Pt on RA with SpO2 88-92%, poor waveform at times      Pertinent Vitals/Pain Pain Assessment Pain Assessment: No/denies pain    Home Living                          Prior Function            PT Goals (current goals can now be found in the care plan section) Acute Rehab PT Goals Patient Stated Goal: to go home PT Goal Formulation: With patient Time For Goal Achievement: 12/13/23 Potential to Achieve Goals: Fair Progress towards PT goals: Progressing  toward goals    Frequency    Min 1X/week      PT Plan      Co-evaluation              AM-PAC PT 6 Clicks Mobility   Outcome Measure  Help needed turning from your back to your side while in a flat bed without using bedrails?: A Little Help needed moving from lying on your back to sitting on the side of a flat bed without using bedrails?: A Little Help needed moving to and from a bed to a chair (including a wheelchair)?: A Little Help needed standing up from a chair using your arms (e.g., wheelchair or bedside chair)?: A Little Help needed to walk in hospital room?: A Little Help needed climbing 3-5 steps with a railing? : A Lot 6 Click Score: 17    End of Session Equipment Utilized During Treatment: Gait belt Activity Tolerance: Patient tolerated treatment well Patient left: in chair;with call bell/phone within reach;with chair alarm set;with family/visitor present Nurse Communication: Mobility status;Other (comment) (SpO2) PT Visit Diagnosis: Unsteadiness on feet (R26.81);Other abnormalities of gait and mobility (R26.89);Muscle weakness (generalized) (M62.81);Difficulty in walking, not elsewhere classified (R26.2)     Time: 9061-9041 PT Time Calculation (min) (ACUTE ONLY): 20 min  Charges:    $Gait Training: 8-22 mins PT General Charges $$ ACUTE PT VISIT: 1 Visit                     Tori Jamin Panther PT, DPT 12/08/23, 10:42 AM

## 2023-12-08 NOTE — Progress Notes (Signed)
 Per CM she will rep know at the facility my attempt to call and give report

## 2023-12-08 NOTE — Discharge Summary (Signed)
 Physician Discharge Summary   Patient: Brenda Gibson MRN: [id number] DOB: 11-26-1940  Admit date:     11/27/2023  Discharge date: 12/08/23  Discharge Physician: Lonni SHAUNNA Dalton   PCP:  [facility]    Recommendations at discharge:  Taper prednisone  over next 6 days as below Continue new oxygen, wean if appropriate     Discharge Diagnoses: Principal Problem:   Acute respiratory failure with hypoxia and hypercapnia due to COPD exacerbation due to RSV pneumonia Active Problems:   Tobacco abuse   Essential hypertension, benign   Malnutrition of moderate degree   COPD exacerbation (HCC)   RSV (respiratory syncytial virus pneumonia)   Acute metabolic encephalopathy   Pressure injury of skin ruled out   Dementia (HCC)   PVD (peripheral vascular disease) [facility])      Hospital Course: [name] with COPD not on home O2, HTN who presented from SNF with respiratory distress, wheezing, admitted to SDU on BiPAP for COPD.  Found to be RSV positive.       * Acute respiratory failure with hypoxia and hypercapnia (HCC) Due to COPD exacerbation due to RSV pneumonia Patient was admitted on BiPAP in stepdown.  She was treated with steroids, bronchodilators, and weaned down to supplemental oxygen.  Echocardiogram showed preserved EF.  Her oxygen levels weaned down, her mentation returned to normal, and she was discharged with a 6-day prednisone  taper, continue DuoNebs, Tessalon , and new ICS/LABA.     PVD (peripheral vascular disease) (HCC) On aspirin , Plavix , Lipitor  Acute metabolic encephalopathy Dementia Due to respiratory failure, mentation now resolved to baseline            [place]  Controlled Substances Registry was reviewed for this patient prior to discharge.  Disposition: Long term care facility Diet recommendation:  Cardiac diet  DISCHARGE MEDICATION: Allergies as of 12/08/2023   No Known Allergies      Medication List      STOP taking these medications    Combivent  Respimat 20-100 MCG/ACT Aers respimat Generic drug: Ipratropium-Albuterol  Replaced by: ipratropium-albuterol  0.5-2.5 (3) MG/3ML Soln   doxycycline  100 MG capsule Commonly known as: VIBRAMYCIN    hydrochlorothiazide  12.5 MG tablet Commonly known as: HYDRODIURIL        TAKE these medications    acetaminophen  500 MG tablet Commonly known as: TYLENOL  Take 1,000 mg by mouth every 8 (eight) hours as needed for moderate pain.   amLODipine  2.5 MG tablet Commonly known as: NORVASC  Take 1 tablet (2.5 mg total) by mouth daily.   ascorbic acid  500 MG tablet Commonly known as: VITAMIN C Take 500 mg by mouth daily.   aspirin  EC 81 MG tablet Take 81 mg by mouth in the morning and at bedtime. Swallow whole.   atorvastatin  80 MG tablet Commonly known as: LIPITOR Take 80 mg by mouth daily.   benzonatate  200 MG capsule Commonly known as: TESSALON  Take 1 capsule (200 mg total) by mouth 2 (two) times daily as needed for cough.   CALTRATE 600 PLUS-VIT D PO Take 1 tablet by mouth in the morning and at bedtime. 600 mg-20 mcg (800 unit)   clopidogrel  75 MG tablet Commonly known as: PLAVIX  Take 75 mg by mouth daily.   feeding supplement Liqd Take 237 mLs by mouth 2 (two) times daily between meals.   ipratropium-albuterol  0.5-2.5 (3) MG/3ML Soln Commonly known as: DUONEB Take 3 mLs by nebulization every 6 (six) hours as needed. Replaces: Combivent  Respimat 20-100 MCG/ACT Aers respimat   lactulose  (  encephalopathy) 10 GM/15ML Soln Commonly known as: CHRONULAC  Take 15 g by mouth 2 (two) times daily.   mometasone -formoterol  100-5 MCG/ACT Aero Commonly known as: DULERA  Inhale 2 puffs into the lungs 2 (two) times daily.   multivitamin with minerals tablet Take 1 tablet by mouth at bedtime.   pantoprazole  20 MG tablet Commonly known as: PROTONIX  Take 20 mg by mouth daily.   polyethylene glycol powder 17 GM/SCOOP powder Commonly known  as: MiraLax  Take 17 g by mouth daily.   predniSONE  10 MG tablet Commonly known as: DELTASONE  Take prednisone  30 mg once daily for 3 days then prednisone  20 mg once daily for 3 days then prednisone  10 mg once daily for 3 days then stop What changed:  medication strength how much to take how to take this when to take this additional instructions   Selenium Sulfide 2.25 % Sham Apply 20 mLs topically See admin instructions. Apply 20 ml to scalp on Monday, Wednesday, and Friday. Leave on scalp for 5 minutes before rinsing   senna 8.6 MG Tabs tablet Commonly known as: SENOKOT Take 8.6 mg by mouth 2 (two) times daily as needed (bowel aid).   sennosides-docusate sodium  8.6-50 MG tablet Commonly known as: SENOKOT-S Take 2 tablets by mouth at bedtime.               Durable Medical Equipment  (From admission, onward)           Start     Ordered   12/08/23 0838  DME Oxygen  Once       Question Answer Comment  Length of Need Lifetime   Mode or (Route) Nasal cannula   Liters per Minute 2   Frequency Continuous (stationary and portable oxygen unit needed)   Oxygen delivery system Gas      12/08/23 0838            Contact information for after-discharge care     Destination     [facility] Preferred SNF .   Service: Skilled Nursing Contact information: [address] [place]  [phone]                     Discharge Instructions     Increase activity slowly   Complete by: As directed        Discharge Exam: Filed Weights   11/27/23 1828 11/28/23 1055  Weight: 63.5 kg 55.1 kg    General: Pt is alert, awake, not in acute distress, sitting up in bed Cardiovascular: RRR, nl S1-S2, no murmurs appreciated.   No LE edema.   Respiratory: Normal respiratory rate and rhythm.  Overall diminished, no rales or wheezes appreciated Abdominal: Abdomen soft and non-tender.  No distension or HSM.    Neuro/Psych: Strength symmetric in upper and lower extremities.  Judgment and insight appear impaired but at baseline, moves all extremities with generalized weakness but symmetric strength.   Condition at discharge: fair  The results of significant diagnostics from this hospitalization (including imaging, microbiology, ancillary and laboratory) are listed below for reference.   Imaging Studies: DG Chest 2 View Result Date: 12/07/2023 CLINICAL DATA:  Hypoxia EXAM: CHEST - 2 VIEW COMPARISON:  Chest x-ray 12/01/2023 FINDINGS: The heart size and mediastinal contours are within normal limits. Both lungs are clear. The visualized skeletal structures are unremarkable. IMPRESSION: No active cardiopulmonary disease. Electronically Signed   By: Greig Pique M.D.   On: 12/07/2023 19:24   DG CHEST PORT 1 VIEW  Result Date: 12/01/2023 CLINICAL DATA:  83 year old female with history of acute respiratory failure with hypoxia and hypercapnia. EXAM: PORTABLE CHEST 1 VIEW COMPARISON:  Chest x-ray 11/27/2023. FINDINGS: Lung volumes are normal. No consolidative airspace disease. Trace right pleural effusion. No definite left pleural effusion. No pneumothorax. No evidence of pulmonary edema. Heart size is normal. The patient is rotated to the right on today's exam, resulting in distortion of the mediastinal contours and reduced diagnostic sensitivity and specificity for mediastinal pathology. Atherosclerotic calcifications are noted in the thoracic aorta. IMPRESSION: 1. Trace right pleural effusion. 2. Aortic atherosclerosis. Electronically Signed   By: Toribio Aye M.D.   On: 12/01/2023 05:28   ECHOCARDIOGRAM COMPLETE Result Date: 11/29/2023    ECHOCARDIOGRAM REPORT   Patient Name:   JAXIE RACANELLI Date of Exam: 11/29/2023 Medical Rec #:  [id number]       Height:       65.0 in Accession #:    [id number]      Weight:       121.5 lb Date of Birth:  [birth year]        BSA:          1.600 m Patient Age:    82 years         BP:           172/83 mmHg Patient Gender: F               HR:           91 bpm. Exam Location:  Inpatient Procedure: 2D Echo, Cardiac Doppler and Color Doppler Indications:    Dyspnea  History:        Patient has prior history of Echocardiogram examinations, most                 recent 10/27/2022. COPD.  Sonographer:    Amy Chionchio Referring Phys: [id number] VIJAYA AKULA IMPRESSIONS  1. Left ventricular ejection fraction, by estimation, is 70 to 75%. The left ventricle has hyperdynamic function. Left ventricular endocardial border not optimally defined to evaluate regional wall motion. There is mild concentric left ventricular hypertrophy. Left ventricular diastolic function could not be evaluated.  2. Right ventricular systolic function is normal. The right ventricular size is normal.  3. A small pericardial effusion is present. The pericardial effusion is localized near the right ventricle. There is no evidence of cardiac tamponade.  4. The mitral valve is normal in structure. Trivial mitral valve regurgitation. No evidence of mitral stenosis.  5. The aortic valve is normal in structure. Aortic valve regurgitation is not visualized. No aortic stenosis is present. Conclusion(s)/Recommendation(s): Technically very limited study with multiple off-axis images. FINDINGS  Left Ventricle: Left ventricular ejection fraction, by estimation, is 70 to 75%. The left ventricle has hyperdynamic function. Left ventricular endocardial border not optimally defined to evaluate regional wall motion. The left ventricular internal cavity size was normal in size. There is mild concentric left ventricular hypertrophy. Left ventricular diastolic function could not be evaluated. Right Ventricle: The right ventricular size is normal. No increase in right ventricular wall thickness. Right ventricular systolic function is normal. Left Atrium: Left atrial size was normal in size. Right Atrium: Right atrial size was normal in size. Pericardium: A  small pericardial effusion is present. The pericardial effusion is localized near the right ventricle. There is no evidence of cardiac tamponade. Mitral Valve: The mitral valve is normal in structure. Trivial mitral valve regurgitation. No evidence of mitral valve stenosis. Tricuspid Valve: The tricuspid  valve is normal in structure. Tricuspid valve regurgitation is trivial. No evidence of tricuspid stenosis. Aortic Valve: The aortic valve is normal in structure. Aortic valve regurgitation is not visualized. No aortic stenosis is present. Pulmonic Valve: The pulmonic valve was not well visualized. Pulmonic valve regurgitation is not visualized. No evidence of pulmonic stenosis. Aorta: The aortic root was not well visualized. Venous: The inferior vena cava was not well visualized. IAS/Shunts: No atrial level shunt detected by color flow Doppler.  LEFT VENTRICLE PLAX 2D LVIDd:         3.00 cm LVIDs:         2.00 cm LV PW:         1.00 cm LV IVS:        1.00 cm  RIGHT VENTRICLE TAPSE (M-mode): 1.1 cm TRICUSPID VALVE TR Peak grad:   48.7 mmHg TR Vmax:        349.00 cm/s Toribio Fuel MD Electronically signed by Toribio Fuel MD Signature Date/Time: 11/29/2023/3:26:20 PM    Final    DG Chest Port 1 View Result Date: 11/27/2023 CLINICAL DATA:  Respiratory distress EXAM: PORTABLE CHEST 1 VIEW COMPARISON:  Chest x-ray 11/02/2022 FINDINGS: There some minimal patchy opacities in the right costophrenic angle. There is no pleural effusion or pneumothorax. The cardiomediastinal silhouette is within normal limits. No acute fractures are seen. IMPRESSION: Minimal patchy opacities in the right costophrenic angle, atelectasis versus infiltrate. Electronically Signed   By: Greig Pique M.D.   On: 11/27/2023 19:22    Microbiology: Results for orders placed or performed during the hospital encounter of 11/27/23  Resp panel by RT-PCR (RSV, Flu A&B, Covid) Anterior Nasal Swab     Status: Abnormal   Collection Time:  11/27/23  6:20 PM   Specimen: Anterior Nasal Swab  Result Value Ref Range Status   SARS Coronavirus 2 by RT PCR NEGATIVE NEGATIVE Final    Comment: (NOTE) SARS-CoV-2 target nucleic acids are NOT DETECTED.  The SARS-CoV-2 RNA is generally detectable in upper respiratory specimens during the acute phase of infection. The lowest concentration of SARS-CoV-2 viral copies this assay can detect is 138 copies/mL. A negative result does not preclude SARS-Cov-2 infection and should not be used as the sole basis for treatment or other patient management decisions. A negative result may occur with  improper specimen collection/handling, submission of specimen other than nasopharyngeal swab, presence of viral mutation(s) within the areas targeted by this assay, and inadequate number of viral copies(<138 copies/mL). A negative result must be combined with clinical observations, patient history, and epidemiological information. The expected result is Negative.  Fact Sheet for Patients:  bloggercourse.com  Fact Sheet for Healthcare Providers:  seriousbroker.it  This test is no t yet approved or cleared by the [place]  FDA and  has been authorized for detection and/or diagnosis of SARS-CoV-2 by FDA under an Emergency Use Authorization (EUA). This EUA will remain  in effect (meaning this test can be used) for the duration of the COVID-19 declaration under Section 564(b)(1) of the Act, 21 U.S.C.section 360bbb-3(b)(1), unless the authorization is terminated  or revoked sooner.       Influenza A by PCR NEGATIVE NEGATIVE Final   Influenza B by PCR NEGATIVE NEGATIVE Final    Comment: (NOTE) The Xpert Xpress SARS-CoV-2/FLU/RSV plus assay is intended as an aid in the diagnosis of influenza from Nasopharyngeal swab specimens and should not be used as a sole basis for treatment. Nasal washings and aspirates are unacceptable for Xpert Xpress  SARS-CoV-2/FLU/RSV testing.  Fact Sheet for Patients: bloggercourse.com  Fact Sheet for Healthcare Providers: seriousbroker.it  This test is not yet approved or cleared by the [place]  FDA and has been authorized for detection and/or diagnosis of SARS-CoV-2 by FDA under an Emergency Use Authorization (EUA). This EUA will remain in effect (meaning this test can be used) for the duration of the COVID-19 declaration under Section 564(b)(1) of the Act, 21 U.S.C. section 360bbb-3(b)(1), unless the authorization is terminated or revoked.     Resp Syncytial Virus by PCR POSITIVE (A) NEGATIVE Final    Comment: (NOTE) Fact Sheet for Patients: bloggercourse.com  Fact Sheet for Healthcare Providers: seriousbroker.it  This test is not yet approved or cleared by the [place]  FDA and has been authorized for detection and/or diagnosis of SARS-CoV-2 by FDA under an Emergency Use Authorization (EUA). This EUA will remain in effect (meaning this test can be used) for the duration of the COVID-19 declaration under Section 564(b)(1) of the Act, 21 U.S.C. section 360bbb-3(b)(1), unless the authorization is terminated or revoked.  Performed at [facility], [address] [place], [place] [address]     Labs: CBC: Recent Labs  Lab 12/06/23 0527 12/08/23 0608  WBC 12.4* 11.4*  HGB 12.1 10.4*  HCT 42.7 37.1  MCV 80.6 80.7  PLT 262 254   Basic Metabolic Panel: Recent Labs  Lab 12/02/23 0309 12/03/23 0246 12/04/23 0259 12/06/23 0527 12/08/23 0608  NA 139 141 142 140 138  K 2.9* 4.0 3.7 3.6 3.4*  CL 91* 98 98 98 96*  CO2 38* 33* 36* 34* 32  GLUCOSE 116* 112* 108* 94 141*  BUN 35* 24* 29* 23 21  CREATININE 0.72 0.54 0.57 0.55 0.56  CALCIUM  8.8* 8.9 9.1 8.8* 9.1  MG 2.4  --  2.6*  --   --   PHOS 3.7  --  3.1  --   --    Liver Function Tests: Recent  Labs  Lab 12/08/23 0608  AST 17  ALT 23  ALKPHOS 45  BILITOT 0.6  PROT 5.5*  ALBUMIN 2.8*   CBG: No results for input(s): GLUCAP in the last 168 hours.  Discharge time spent: approximately 45 minutes spent on discharge counseling, evaluation of patient on day of discharge, and coordination of discharge planning with nursing, social work, pharmacy and case management  Signed: Lonni SHAUNNA Dalton, MD [facility] 12/08/2023

## 2023-12-08 NOTE — Plan of Care (Signed)
 Stable for return to SNF.

## 2023-12-08 NOTE — Care Management Important Message (Deleted)
 Important Message  Patient Details  Name: Brenda Gibson MRN: [id number] Date of Birth: Mar 08, 1941   Important Message Given:  Yes - Medicare IM (unable to reach legal guardian at [phone] (no vm set up), reviewed letter with son at [phone])     Duwaine LITTIE Ada 12/08/2023, 2:49 PM

## 2023-12-08 NOTE — TOC Transition Note (Addendum)
 Transition of Care [facility]) - Discharge Note   Patient Details  Name: Brenda Gibson MRN: [id number] Date of Birth: [birth year]  [facility] CM/SW Contact:  Bascom Service, RN Phone Number: 12/08/2023, 2:34 PM   Clinical Narrative: d/c back to [facility];rep starr accepted back going to rm#[id number], report#[phone]. PTAR called. No further CM needs.  -3:56p-Per nsg attempting to call for report tel#[phone]. I have left vm with Erie Moles l rep to inform we r still trying to reach nsg for report. I have provided the main tel# on starr vm for nurse to call the floor to receive report. PTAR has already been called.   -4p-facility is aware of patient coming & numerous attempts to give report-they will accept patient. Nsg aware. When PTAR comes patient can leave to [facility] rep Erie contacted. No further CM needs.    Final next level of care: Skilled Nursing Facility Barriers to Discharge: No Barriers Identified   Patient Goals and CMS Choice Patient states their goals for this hospitalization and ongoing recovery are:: Rehab CMS Medicare.gov Compare Post Acute Care list provided to:: Legal Guardian        Discharge Placement              Patient chooses bed at: [facility] Patient to be transferred to facility by: PTAR Name of family member notified: Shelba Thompson(legal guardian) Patient and family notified of of transfer: 12/08/23  Discharge Plan and Services Additional resources added to the After Visit Summary for       Post Acute Care Choice: Nursing Home                               Social Drivers of Health (SDOH) Interventions SDOH Screenings   Food Insecurity: No Food Insecurity (11/28/2023)  Housing: Unknown (11/28/2023)  Transportation Needs: No Transportation Needs (11/28/2023)  Utilities: Not At Risk (11/28/2023)  Social Connections: Unknown (11/28/2023)  Tobacco Use: High Risk (11/27/2023)     Readmission Risk Interventions      No data to display

## 2023-12-08 NOTE — TOC Transition Note (Signed)
 Transition of Care [facility]) - Discharge Note   Patient Details  Name: Brenda Gibson MRN: [id number] Date of Birth: [birth year]  Transition of Care [facility]) CM/SW Contact:  Bascom Service, RN Phone Number: 12/08/2023, 11:08 AM   Clinical Narrative: spoke to Legal Guardian Adrienne Tompson-prefers to email d/c summary to [email or web address]. Patient may need 02-awaiting d/c summary. [address] for return LTC rep [email or web address] @ d/c.      Final next level of care: Skilled Nursing Facility Barriers to Discharge: No Barriers Identified   Patient Goals and CMS Choice Patient states their goals for this hospitalization and ongoing recovery are:: Return rehab CMS Medicare.gov Compare Post Acute Care list provided to:: Legal Guardian        Discharge Placement                       Discharge Plan and Services Additional resources added to the After Visit Summary for                                       Social Drivers of Health (SDOH) Interventions SDOH Screenings   Food Insecurity: No Food Insecurity (11/28/2023)  Housing: Unknown (11/28/2023)  Transportation Needs: No Transportation Needs (11/28/2023)  Utilities: Not At Risk (11/28/2023)  Social Connections: Unknown (11/28/2023)  Tobacco Use: High Risk (11/27/2023)     Readmission Risk Interventions     No data to display

## 2023-12-08 NOTE — Care Management Important Message (Signed)
 Important Message  Patient Details  Name: Brenda Gibson MRN: [id number] Date of Birth: [birth year]   Important Message Given:  Yes - Medicare IM (spoke to Legal guardian Shelba Hummer [phone]-inormed of medicare IM notice of d/c. certified mail sent.ROMONA Bascom Service, RN 12/08/2023, 3:22 PM

## 2023-12-08 NOTE — Progress Notes (Signed)
 Attempted to call repot to [facility].  Transferred to unit and the phone rang for 1 minute without anyone answering. Call forwarded and again no one answered. Rang for an additional minute. Will follow up with CM for next step

## 2023-12-09 NOTE — Progress Notes (Signed)
 [facility] Liaison Note:   (new referral for outpatient palliative services)  Notified by WONDA Nathanel Cowden, RN of patient/family request for [facility] Palliative Care services at [facility]  Referral submitted today.    Please call with any hospice or outpatient palliative care related questions.  Thank you for the opportunity to participate in this patient's care.  Marinell Nova, [facility] Liaison [phone]

## 2023-12-20 ENCOUNTER — Other Ambulatory Visit (HOSPITAL_COMMUNITY): Payer: Self-pay

## 2024-01-09 ENCOUNTER — Ambulatory Visit (INDEPENDENT_AMBULATORY_CARE_PROVIDER_SITE_OTHER): Admitting: Physician Assistant

## 2024-01-09 ENCOUNTER — Encounter: Payer: Self-pay | Admitting: Physician Assistant

## 2024-01-09 ENCOUNTER — Telehealth: Payer: Self-pay

## 2024-01-09 ENCOUNTER — Other Ambulatory Visit: Payer: Self-pay

## 2024-01-09 VITALS — BP 137/77 | HR 64 | Temp 97.3°F | Resp 18 | Ht 65.0 in | Wt 125.0 lb

## 2024-01-09 DIAGNOSIS — I96 Gangrene, not elsewhere classified: Secondary | ICD-10-CM | POA: Diagnosis not present

## 2024-01-09 DIAGNOSIS — I70234 Atherosclerosis of native arteries of right leg with ulceration of heel and midfoot: Secondary | ICD-10-CM

## 2024-01-09 DIAGNOSIS — I739 Peripheral vascular disease, unspecified: Secondary | ICD-10-CM

## 2024-01-09 NOTE — H&P (View-Only) (Signed)
 [facility] [place] HISTORY AND PHYSICAL   History of Present Illness:  Patient is a 83 y.o. year old female who presents for evaluation of PAD.  She underwent an ABI at her SNF which reported absent ABI on the right LE and she has developed a non healing wound.  She was seen by Dr. Hetty Blend on 10/21/23 for Abnormal ABI's.  At that time she was asymptomatic for claudication, rest pain and non healing wounds.  The plan was for medical management.    Her skilled facility called with report of non healing right heel wound and an ABI that demonstrated absent inflow to the tibials.  She is not a very good historian and she is here from her skilled facility by herself.  She does walk to the bathroom a lone she states.  She denies rest pain that wakes her from sleep, no previous wounds, and no short distance claudication .  She states she doesn't move around much and lives a sedentary lifestyle. She has been diagnosed with Dementia and mentioned maybe having a granddaughter that lives near by.  She is medically managed with ASA and Plavix daily as well as a Statin.    Past Medical History:  Diagnosis Date   Basal cell carcinoma of ala nasi    Cataract    COVID-19 virus infection 05/22/2021   Essential hypertension, benign 06/05/2013   Mild, progressive hypertension. Isolated systolic hypertension.   Serial BPs:  --08/02/13: 130/80, recent measurements from home range from 137/73 to 149/75 with higher measurement being when skipping furosemide due to overactive bladder symptoms --06/05/13: 150/86 --02/22/13: 118/60   Hyperglycemia 05/22/2021   Hypertension    Knee pain, bilateral    Measles    Myalgia    Osteoporosis 08/12/2014   Seborrheic dermatitis of scalp    Small bowel obstruction (HCC)    Tobacco abuse 02/24/2011   Varicella     Past Surgical History:  Procedure Laterality Date   BASAL CELL CARCINOMA EXCISION     '11 ([facility])   CATARACT EXTRACTION W/ INTRAOCULAR LENS IMPLANT      right eye. '09 (Dr. Jettie Pagan)   INTRAMEDULLARY (IM) NAIL INTERTROCHANTERIC Right 10/27/2022   Procedure: INTRAMEDULLARY (IM) NAIL INTERTROCHANTERIC;  Surgeon: London Sheer, MD;  Location: [facility];  Service: Orthopedics;  Laterality: Right;   LAPAROTOMY N/A 06/25/2016   Procedure: EXPLORATORY LAPAROTOMY, lysis of adhesion;  Surgeon: Abigail Miyamoto, MD;  Location: [facility];  Service: General;  Laterality: N/A;   LYSIS OF ADHESION  06/25/2016   Dr Carman Ching    ROS:   General:  No weight loss, Fever, chills  HEENT: No recent headaches, no nasal bleeding, no visual changes, no sore throat  Neurologic: No dizziness, blackouts, seizures. No recent symptoms of stroke or mini- stroke. No recent episodes of slurred speech, or temporary blindness.  Cardiac: No recent episodes of chest pain/pressure, no shortness of breath at rest.  No shortness of breath with exertion.  Denies history of atrial fibrillation or irregular heartbeat  Vascular: No history of rest pain in feet.  No history of claudication.  No history of non-healing ulcer, No history of DVT   Pulmonary: No home oxygen, no productive cough, no hemoptysis,  No asthma or wheezing  Musculoskeletal:  [ ]  Arthritis, [ ]  Low back pain,  [ ]  Joint pain  Hematologic:No history of hypercoagulable state.  No history of easy bleeding.  No history of anemia  Gastrointestinal: No hematochezia or melena,  No  gastroesophageal reflux, no trouble swallowing  Urinary: [ ]  chronic Kidney disease, [ ]  on HD - [ ]  MWF or [ ]  TTHS, [ ]  Burning with urination, [ ]  Frequent urination, [ ]  Difficulty urinating;   Skin: No rashes  Psychological: No history of anxiety,  No history of depression  Social History Social History   Tobacco Use   Smoking status: Some Days    Current packs/day: 0.60    Average packs/day: 0.6 packs/day for 52.0 years (31.2 ttl pk-yrs)    Types: Cigarettes   Smokeless tobacco: Never   Tobacco comments:    Pt smokes 5  cigarettes per day  Vaping Use   Vaping status: Never Used  Substance Use Topics   Alcohol use: Not Currently    Comment: rare use   Drug use: No    Family History Family History  Problem Relation Age of Onset   Alcohol abuse Mother    Cancer Father 21       stomach cancer   Prostate cancer Father    Hypertension Maternal Grandfather    Diabetes Neg Hx    Heart disease Neg Hx     Allergies  No Known Allergies   Current Outpatient Medications  Medication Sig Dispense Refill   acetaminophen (TYLENOL) 500 MG tablet Take 1,000 mg by mouth every 8 (eight) hours as needed for moderate pain.     amLODipine (NORVASC) 2.5 MG tablet Take 1 tablet (2.5 mg total) by mouth daily.     ascorbic acid (VITAMIN C) 500 MG tablet Take 500 mg by mouth daily.     aspirin EC 81 MG tablet Take 81 mg by mouth in the morning and at bedtime. Swallow whole.     atorvastatin (LIPITOR) 80 MG tablet Take 80 mg by mouth daily.     benzonatate (TESSALON) 200 MG capsule Take 1 capsule (200 mg total) by mouth 2 (two) times daily as needed for cough. 20 capsule 0   Calcium-Vitamin D (CALTRATE 600 PLUS-VIT D PO) Take 1 tablet by mouth in the morning and at bedtime. 600 mg-20 mcg (800 unit)     clopidogrel (PLAVIX) 75 MG tablet Take 75 mg by mouth daily.     feeding supplement (ENSURE ENLIVE / ENSURE PLUS) LIQD Take 237 mLs by mouth 2 (two) times daily between meals.     ipratropium-albuterol (DUONEB) 0.5-2.5 (3) MG/3ML SOLN Take 3 mLs by nebulization every 6 (six) hours as needed.     lactulose, encephalopathy, (CHRONULAC) 10 GM/15ML SOLN Take 15 g by mouth 2 (two) times daily.     mometasone-formoterol (DULERA) 100-5 MCG/ACT AERO Inhale 2 puffs into the lungs 2 (two) times daily.     Multiple Vitamins-Minerals (MULTIVITAMIN WITH MINERALS) tablet Take 1 tablet by mouth at bedtime.     pantoprazole (PROTONIX) 20 MG tablet Take 20 mg by mouth daily.     polyethylene glycol powder (MIRALAX) 17 GM/SCOOP powder Take  17 g by mouth daily. 238 g 0   predniSONE (DELTASONE) 10 MG tablet Take prednisone 30 mg once daily for 3 days then prednisone 20 mg once daily for 3 days then prednisone 10 mg once daily for 3 days then stop     Selenium Sulfide 2.25 % SHAM Apply 20 mLs topically See admin instructions. Apply 20 ml to scalp on Monday, Wednesday, and Friday. Leave on scalp for 5 minutes before rinsing     senna (SENOKOT) 8.6 MG TABS tablet Take 8.6 mg by mouth 2 (two) times  daily as needed (bowel aid).     sennosides-docusate sodium (SENOKOT-S) 8.6-50 MG tablet Take 2 tablets by mouth at bedtime.     No current facility-administered medications for this visit.    Physical Examination  Vitals:   01/09/24 1241  BP: 137/77  Pulse: 64  Resp: 18  Temp: (!) 97.3 F (36.3 C)  TempSrc: Temporal  SpO2: 93%  Weight: 125 lb (56.7 kg)  Height: 5\' 5"  (1.651 m)    Body mass index is 20.8 kg/m.  General:  Alert and oriented, no acute distress HEENT: Normal Neck: No bruit or JVD Pulmonary: Clear to auscultation bilaterally Cardiac: Regular Rate and Rhythm without murmur Abdomen: Soft, non-tender, non-distended, no mass, no scars Skin: No rash Dry gangrene wound on the heel  Extremity Pulses:   radial weak femoral, pulses bilaterally, no pedal pulses.  Monophasic right doppler signals heard on exam today D/Peroneal on the right and DP/PT/peroneal on the left Musculoskeletal: No deformity or edema  Neurologic: Upper and lower extremity motor  and symmetric     ASSESSMENT/PLAN:  PAD with new non healing heel ulcer dry gangrene on the right LE She has some tenderness when I touch the heel area.  No opening in the skin, dry gangrene heel right LE.  Weak doppler signals with Abnormal ABI demonstrating absent inflow on the left LE.  She is ambulatory for short distances.  She does not have CLI currently.  I was able to doppler DP/Peroneal signals on the right and motor ins intact.    I will schedule her for  Aorto angiogram with possible intervention on the right LE to assist with healing of the heel wound.  I recommend heel protection boot for ambulation and for sleep to float the heel off the bed to prevent further tissue loss. She has palpable femoral pulse, but it is weak.  She resides at a skilled facility.  She was unable to tell me who her medical power of attorney is.  Cont ASA, Plavix and Statin.       Fabienne Bruns, MD [facility] [place] Office: [phone] Pager: [phone]

## 2024-01-09 NOTE — Progress Notes (Signed)
 [facility] [place] HISTORY AND PHYSICAL   History of Present Illness:  Patient is a 83 y.o. year old female who presents for evaluation of PAD.  She underwent an ABI at her SNF which reported absent ABI on the right LE and she has developed a non healing wound.  She was seen by Dr. Hetty Blend on 10/21/23 for Abnormal ABI's.  At that time she was asymptomatic for claudication, rest pain and non healing wounds.  The plan was for medical management.    Her skilled facility called with report of non healing right heel wound and an ABI that demonstrated absent inflow to the tibials.  She is not a very good historian and she is here from her skilled facility by herself.  She does walk to the bathroom a lone she states.  She denies rest pain that wakes her from sleep, no previous wounds, and no short distance claudication .  She states she doesn't move around much and lives a sedentary lifestyle. She has been diagnosed with Dementia and mentioned maybe having a granddaughter that lives near by.  She is medically managed with ASA and Plavix daily as well as a Statin.    Past Medical History:  Diagnosis Date   Basal cell carcinoma of ala nasi    Cataract    COVID-19 virus infection 05/22/2021   Essential hypertension, benign 06/05/2013   Mild, progressive hypertension. Isolated systolic hypertension.   Serial BPs:  --08/02/13: 130/80, recent measurements from home range from 137/73 to 149/75 with higher measurement being when skipping furosemide due to overactive bladder symptoms --06/05/13: 150/86 --02/22/13: 118/60   Hyperglycemia 05/22/2021   Hypertension    Knee pain, bilateral    Measles    Myalgia    Osteoporosis 08/12/2014   Seborrheic dermatitis of scalp    Small bowel obstruction (HCC)    Tobacco abuse 02/24/2011   Varicella     Past Surgical History:  Procedure Laterality Date   BASAL CELL CARCINOMA EXCISION     '11 ([facility])   CATARACT EXTRACTION W/ INTRAOCULAR LENS IMPLANT      right eye. '09 (Dr. Jettie Pagan)   INTRAMEDULLARY (IM) NAIL INTERTROCHANTERIC Right 10/27/2022   Procedure: INTRAMEDULLARY (IM) NAIL INTERTROCHANTERIC;  Surgeon: London Sheer, MD;  Location: [facility];  Service: Orthopedics;  Laterality: Right;   LAPAROTOMY N/A 06/25/2016   Procedure: EXPLORATORY LAPAROTOMY, lysis of adhesion;  Surgeon: Abigail Miyamoto, MD;  Location: [facility];  Service: General;  Laterality: N/A;   LYSIS OF ADHESION  06/25/2016   Dr Carman Ching    ROS:   General:  No weight loss, Fever, chills  HEENT: No recent headaches, no nasal bleeding, no visual changes, no sore throat  Neurologic: No dizziness, blackouts, seizures. No recent symptoms of stroke or mini- stroke. No recent episodes of slurred speech, or temporary blindness.  Cardiac: No recent episodes of chest pain/pressure, no shortness of breath at rest.  No shortness of breath with exertion.  Denies history of atrial fibrillation or irregular heartbeat  Vascular: No history of rest pain in feet.  No history of claudication.  No history of non-healing ulcer, No history of DVT   Pulmonary: No home oxygen, no productive cough, no hemoptysis,  No asthma or wheezing  Musculoskeletal:  [ ]  Arthritis, [ ]  Low back pain,  [ ]  Joint pain  Hematologic:No history of hypercoagulable state.  No history of easy bleeding.  No history of anemia  Gastrointestinal: No hematochezia or melena,  No  gastroesophageal reflux, no trouble swallowing  Urinary: [ ]  chronic Kidney disease, [ ]  on HD - [ ]  MWF or [ ]  TTHS, [ ]  Burning with urination, [ ]  Frequent urination, [ ]  Difficulty urinating;   Skin: No rashes  Psychological: No history of anxiety,  No history of depression  Social History Social History   Tobacco Use   Smoking status: Some Days    Current packs/day: 0.60    Average packs/day: 0.6 packs/day for 52.0 years (31.2 ttl pk-yrs)    Types: Cigarettes   Smokeless tobacco: Never   Tobacco comments:    Pt smokes 5  cigarettes per day  Vaping Use   Vaping status: Never Used  Substance Use Topics   Alcohol use: Not Currently    Comment: rare use   Drug use: No    Family History Family History  Problem Relation Age of Onset   Alcohol abuse Mother    Cancer Father 21       stomach cancer   Prostate cancer Father    Hypertension Maternal Grandfather    Diabetes Neg Hx    Heart disease Neg Hx     Allergies  No Known Allergies   Current Outpatient Medications  Medication Sig Dispense Refill   acetaminophen (TYLENOL) 500 MG tablet Take 1,000 mg by mouth every 8 (eight) hours as needed for moderate pain.     amLODipine (NORVASC) 2.5 MG tablet Take 1 tablet (2.5 mg total) by mouth daily.     ascorbic acid (VITAMIN C) 500 MG tablet Take 500 mg by mouth daily.     aspirin EC 81 MG tablet Take 81 mg by mouth in the morning and at bedtime. Swallow whole.     atorvastatin (LIPITOR) 80 MG tablet Take 80 mg by mouth daily.     benzonatate (TESSALON) 200 MG capsule Take 1 capsule (200 mg total) by mouth 2 (two) times daily as needed for cough. 20 capsule 0   Calcium-Vitamin D (CALTRATE 600 PLUS-VIT D PO) Take 1 tablet by mouth in the morning and at bedtime. 600 mg-20 mcg (800 unit)     clopidogrel (PLAVIX) 75 MG tablet Take 75 mg by mouth daily.     feeding supplement (ENSURE ENLIVE / ENSURE PLUS) LIQD Take 237 mLs by mouth 2 (two) times daily between meals.     ipratropium-albuterol (DUONEB) 0.5-2.5 (3) MG/3ML SOLN Take 3 mLs by nebulization every 6 (six) hours as needed.     lactulose, encephalopathy, (CHRONULAC) 10 GM/15ML SOLN Take 15 g by mouth 2 (two) times daily.     mometasone-formoterol (DULERA) 100-5 MCG/ACT AERO Inhale 2 puffs into the lungs 2 (two) times daily.     Multiple Vitamins-Minerals (MULTIVITAMIN WITH MINERALS) tablet Take 1 tablet by mouth at bedtime.     pantoprazole (PROTONIX) 20 MG tablet Take 20 mg by mouth daily.     polyethylene glycol powder (MIRALAX) 17 GM/SCOOP powder Take  17 g by mouth daily. 238 g 0   predniSONE (DELTASONE) 10 MG tablet Take prednisone 30 mg once daily for 3 days then prednisone 20 mg once daily for 3 days then prednisone 10 mg once daily for 3 days then stop     Selenium Sulfide 2.25 % SHAM Apply 20 mLs topically See admin instructions. Apply 20 ml to scalp on Monday, Wednesday, and Friday. Leave on scalp for 5 minutes before rinsing     senna (SENOKOT) 8.6 MG TABS tablet Take 8.6 mg by mouth 2 (two) times  daily as needed (bowel aid).     sennosides-docusate sodium (SENOKOT-S) 8.6-50 MG tablet Take 2 tablets by mouth at bedtime.     No current facility-administered medications for this visit.    Physical Examination  Vitals:   01/09/24 1241  BP: 137/77  Pulse: 64  Resp: 18  Temp: (!) 97.3 F (36.3 C)  TempSrc: Temporal  SpO2: 93%  Weight: 125 lb (56.7 kg)  Height: 5\' 5"  (1.651 m)    Body mass index is 20.8 kg/m.  General:  Alert and oriented, no acute distress HEENT: Normal Neck: No bruit or JVD Pulmonary: Clear to auscultation bilaterally Cardiac: Regular Rate and Rhythm without murmur Abdomen: Soft, non-tender, non-distended, no mass, no scars Skin: No rash Dry gangrene wound on the heel  Extremity Pulses:   radial weak femoral, pulses bilaterally, no pedal pulses.  Monophasic right doppler signals heard on exam today D/Peroneal on the right and DP/PT/peroneal on the left Musculoskeletal: No deformity or edema  Neurologic: Upper and lower extremity motor  and symmetric     ASSESSMENT/PLAN:  PAD with new non healing heel ulcer dry gangrene on the right LE She has some tenderness when I touch the heel area.  No opening in the skin, dry gangrene heel right LE.  Weak doppler signals with Abnormal ABI demonstrating absent inflow on the left LE.  She is ambulatory for short distances.  She does not have CLI currently.  I was able to doppler DP/Peroneal signals on the right and motor ins intact.    I will schedule her for  Aorto angiogram with possible intervention on the right LE to assist with healing of the heel wound.  I recommend heel protection boot for ambulation and for sleep to float the heel off the bed to prevent further tissue loss. She has palpable femoral pulse, but it is weak.  She resides at a skilled facility.  She was unable to tell me who her medical power of attorney is.  Cont ASA, Plavix and Statin.       Fabienne Bruns, MD [facility] [place] Office: [phone] Pager: [phone]

## 2024-01-09 NOTE — Telephone Encounter (Signed)
 Triage:  Brenda Gibson from [facility] called about pt having a none-healing wound to her R heel.  Since her last office visit, pt developed the wound and their wound care nurse has been working with her but has not been able to get it healed.  They had ABI's done a couple of weeks ago in which toe pressure was absent on R - she is faxing the results over to [place].   -facility able to bring pt today/appt available

## 2024-01-16 ENCOUNTER — Observation Stay (HOSPITAL_COMMUNITY)
Admission: RE | Admit: 2024-01-16 | Discharge: 2024-01-17 | Disposition: A | Discharge status: Skilled Nursing Facility | Attending: Vascular Surgery | Admitting: Vascular Surgery

## 2024-01-16 ENCOUNTER — Encounter (HOSPITAL_COMMUNITY): Payer: Self-pay | Admitting: Vascular Surgery

## 2024-01-16 ENCOUNTER — Encounter (HOSPITAL_COMMUNITY)
Admission: RE | Disposition: A | Discharge status: Skilled Nursing Facility | Payer: Self-pay | Source: Home / Self Care | Attending: Vascular Surgery

## 2024-01-16 ENCOUNTER — Other Ambulatory Visit: Payer: Self-pay

## 2024-01-16 DIAGNOSIS — Z7982 Long term (current) use of aspirin: Secondary | ICD-10-CM | POA: Diagnosis not present

## 2024-01-16 DIAGNOSIS — L89619 Pressure ulcer of right heel, unspecified stage: Secondary | ICD-10-CM | POA: Insufficient documentation

## 2024-01-16 DIAGNOSIS — Z8616 Personal history of COVID-19: Secondary | ICD-10-CM | POA: Diagnosis not present

## 2024-01-16 DIAGNOSIS — I10 Essential (primary) hypertension: Secondary | ICD-10-CM | POA: Insufficient documentation

## 2024-01-16 DIAGNOSIS — I70234 Atherosclerosis of native arteries of right leg with ulceration of heel and midfoot: Secondary | ICD-10-CM | POA: Diagnosis not present

## 2024-01-16 DIAGNOSIS — I739 Peripheral vascular disease, unspecified: Secondary | ICD-10-CM | POA: Diagnosis present

## 2024-01-16 DIAGNOSIS — Z85828 Personal history of other malignant neoplasm of skin: Secondary | ICD-10-CM | POA: Diagnosis not present

## 2024-01-16 DIAGNOSIS — Z79899 Other long term (current) drug therapy: Secondary | ICD-10-CM | POA: Diagnosis not present

## 2024-01-16 DIAGNOSIS — F17210 Nicotine dependence, cigarettes, uncomplicated: Secondary | ICD-10-CM | POA: Diagnosis not present

## 2024-01-16 HISTORY — PX: UPPER EXTREMITY ANGIOGRAPHY: CATH118270

## 2024-01-16 HISTORY — PX: ABDOMINAL AORTOGRAM: CATH118222

## 2024-01-16 HISTORY — PX: LOWER EXTREMITY ANGIOGRAPHY: CATH118251

## 2024-01-16 LAB — CBC
HCT: 38.2 % (ref 36.0–46.0)
Hemoglobin: 11.6 g/dL — ABNORMAL LOW (ref 12.0–15.0)
MCH: 24.4 pg — ABNORMAL LOW (ref 26.0–34.0)
MCHC: 30.4 g/dL (ref 30.0–36.0)
MCV: 80.3 fL (ref 80.0–100.0)
Platelets: 340 10*3/uL (ref 150–400)
RBC: 4.76 MIL/uL (ref 3.87–5.11)
RDW: 19.9 % — ABNORMAL HIGH (ref 11.5–15.5)
WBC: 13.1 10*3/uL — ABNORMAL HIGH (ref 4.0–10.5)
nRBC: 0 % (ref 0.0–0.2)

## 2024-01-16 LAB — POCT I-STAT, CHEM 8
BUN: 14 mg/dL (ref 8–23)
Calcium, Ion: 1.24 mmol/L (ref 1.15–1.40)
Chloride: 103 mmol/L (ref 98–111)
Creatinine, Ser: 0.7 mg/dL (ref 0.44–1.00)
Glucose, Bld: 99 mg/dL (ref 70–99)
HCT: 40 % (ref 36.0–46.0)
Hemoglobin: 13.6 g/dL (ref 12.0–15.0)
Potassium: 4.2 mmol/L (ref 3.5–5.1)
Sodium: 139 mmol/L (ref 135–145)
TCO2: 29 mmol/L (ref 22–32)

## 2024-01-16 LAB — CREATININE, SERUM
Creatinine, Ser: 0.67 mg/dL (ref 0.44–1.00)
GFR, Estimated: >60 mL/min (ref >60)

## 2024-01-16 MED ORDER — FENTANYL CITRATE (PF) 100 MCG/2ML IJ SOLN
INTRAMUSCULAR | Status: AC
  Filled 2024-01-16: qty 2

## 2024-01-16 MED ORDER — LABETALOL HCL 5 MG/ML IV SOLN: 10.0000 mg | INTRAVENOUS | Status: DC | PRN

## 2024-01-16 MED ORDER — ATORVASTATIN CALCIUM 80 MG PO TABS
80.0000 mg | ORAL_TABLET | Freq: Every day | ORAL | Status: DC
  Administered 2024-01-16: 80 mg via ORAL
  Filled 2024-01-16: qty 1

## 2024-01-16 MED ORDER — POTASSIUM CHLORIDE CRYS ER 20 MEQ PO TBCR: 20.0000 meq | EXTENDED_RELEASE_TABLET | Freq: Once | ORAL | Status: DC

## 2024-01-16 MED ORDER — HYDRALAZINE HCL 20 MG/ML IJ SOLN
INTRAMUSCULAR | Status: AC
  Filled 2024-01-16: qty 1

## 2024-01-16 MED ORDER — ASPIRIN 81 MG PO TBEC
81.0000 mg | DELAYED_RELEASE_TABLET | Freq: Every day | ORAL | Status: DC
  Administered 2024-01-17: 81 mg via ORAL
  Filled 2024-01-16: qty 1

## 2024-01-16 MED ORDER — SODIUM CHLORIDE 0.9 % IV SOLN: INTRAVENOUS | Status: DC

## 2024-01-16 MED ORDER — ONDANSETRON HCL 4 MG/2ML IJ SOLN: 4.0000 mg | Freq: Four times a day (QID) | INTRAMUSCULAR | Status: DC | PRN

## 2024-01-16 MED ORDER — PANTOPRAZOLE SODIUM 40 MG PO TBEC: 40.0000 mg | DELAYED_RELEASE_TABLET | Freq: Every day | ORAL | Status: DC

## 2024-01-16 MED ORDER — HEPARIN SODIUM (PORCINE) 5000 UNIT/ML IJ SOLN
5000.0000 [IU] | Freq: Three times a day (TID) | INTRAMUSCULAR | Status: DC
  Administered 2024-01-16 – 2024-01-17 (×2): 5000 [IU] via SUBCUTANEOUS
  Filled 2024-01-16 (×2): qty 1

## 2024-01-16 MED ORDER — ACETAMINOPHEN 325 MG PO TABS
325.0000 mg | ORAL_TABLET | ORAL | Status: DC | PRN
  Administered 2024-01-16: 650 mg via ORAL

## 2024-01-16 MED ORDER — ACETAMINOPHEN 325 MG RE SUPP: 325.0000 mg | RECTAL | Status: DC | PRN

## 2024-01-16 MED ORDER — AMLODIPINE BESYLATE 5 MG PO TABS
2.5000 mg | ORAL_TABLET | Freq: Every day | ORAL | Status: DC
  Administered 2024-01-17: 2.5 mg via ORAL
  Filled 2024-01-16: qty 1

## 2024-01-16 MED ORDER — ACETAMINOPHEN 325 MG PO TABS
ORAL_TABLET | ORAL | Status: AC
  Filled 2024-01-16: qty 2

## 2024-01-16 MED ORDER — HYDRALAZINE HCL 20 MG/ML IJ SOLN
5.0000 mg | INTRAMUSCULAR | Status: AC | PRN
  Administered 2024-01-16 (×2): 5 mg via INTRAVENOUS

## 2024-01-16 MED ORDER — ALUM & MAG HYDROXIDE-SIMETH 200-200-20 MG/5ML PO SUSP: 15.0000 mL | ORAL | Status: DC | PRN

## 2024-01-16 MED ORDER — PANTOPRAZOLE SODIUM 20 MG PO TBEC
20.0000 mg | DELAYED_RELEASE_TABLET | Freq: Every day | ORAL | Status: DC
Start: 2024-01-16 — End: 2024-01-17
  Administered 2024-01-16 – 2024-01-17 (×2): 20 mg via ORAL
  Filled 2024-01-16 (×2): qty 1

## 2024-01-16 MED ORDER — LIDOCAINE HCL (PF) 1 % IJ SOLN
INTRAMUSCULAR | Status: DC | PRN
  Administered 2024-01-16: 10 mL

## 2024-01-16 MED ORDER — MIDAZOLAM HCL 2 MG/2ML IJ SOLN
INTRAMUSCULAR | Status: DC | PRN
  Administered 2024-01-16: 1 mg via INTRAVENOUS

## 2024-01-16 MED ORDER — GUAIFENESIN-DM 100-10 MG/5ML PO SYRP: 15.0000 mL | ORAL_SOLUTION | ORAL | Status: DC | PRN

## 2024-01-16 MED ORDER — MOMETASONE FURO-FORMOTEROL FUM 100-5 MCG/ACT IN AERO
2.0000 | INHALATION_SPRAY | Freq: Two times a day (BID) | RESPIRATORY_TRACT | Status: DC
  Administered 2024-01-17: 2 via RESPIRATORY_TRACT
  Filled 2024-01-16: qty 8.8

## 2024-01-16 MED ORDER — OXYCODONE HCL 5 MG PO TABS
5.0000 mg | ORAL_TABLET | Freq: Once | ORAL | Status: AC
  Administered 2024-01-16: 5 mg via ORAL

## 2024-01-16 MED ORDER — LIDOCAINE HCL (PF) 1 % IJ SOLN
INTRAMUSCULAR | Status: AC
  Filled 2024-01-16: qty 30

## 2024-01-16 MED ORDER — PHENOL 1.4 % MT LIQD: 1.0000 | OROMUCOSAL | Status: DC | PRN

## 2024-01-16 MED ORDER — MIDAZOLAM HCL 2 MG/2ML IJ SOLN
INTRAMUSCULAR | Status: AC
  Filled 2024-01-16: qty 2

## 2024-01-16 MED ORDER — OXYCODONE HCL 5 MG PO TABS
ORAL_TABLET | ORAL | Status: AC
  Filled 2024-01-16: qty 1

## 2024-01-16 MED ORDER — CLOPIDOGREL BISULFATE 75 MG PO TABS
75.0000 mg | ORAL_TABLET | Freq: Every day | ORAL | Status: DC
  Administered 2024-01-17: 75 mg via ORAL
  Filled 2024-01-16: qty 1

## 2024-01-16 MED ORDER — HEPARIN (PORCINE) IN NACL 1000-0.9 UT/500ML-% IV SOLN
INTRAVENOUS | Status: DC | PRN
  Administered 2024-01-16: 1000 mL

## 2024-01-16 MED ORDER — IPRATROPIUM-ALBUTEROL 0.5-2.5 (3) MG/3ML IN SOLN: 3.0000 mL | Freq: Four times a day (QID) | RESPIRATORY_TRACT | Status: DC | PRN

## 2024-01-16 MED ORDER — LABETALOL HCL 5 MG/ML IV SOLN
INTRAVENOUS | Status: AC
  Filled 2024-01-16: qty 4

## 2024-01-16 MED ORDER — METOPROLOL TARTRATE 5 MG/5ML IV SOLN: 2.0000 mg | INTRAVENOUS | Status: DC | PRN

## 2024-01-16 MED ORDER — IPRATROPIUM-ALBUTEROL 0.5-2.5 (3) MG/3ML IN SOLN
RESPIRATORY_TRACT | Status: AC
  Administered 2024-01-16: 3 mL
  Filled 2024-01-16: qty 3

## 2024-01-16 MED ORDER — FENTANYL CITRATE (PF) 100 MCG/2ML IJ SOLN
INTRAMUSCULAR | Status: DC | PRN
  Administered 2024-01-16: 50 ug via INTRAVENOUS

## 2024-01-16 NOTE — Op Note (Signed)
    Patient name: Brenda Gibson MRN: [id number] DOB: [birth year] Sex: female  01/16/2024 Pre-operative Diagnosis: CL TI with a right heel ulcer Post-operative diagnosis:  Same Surgeon:  Daria Pastures, MD Procedure Performed:  Ultrasound-guided access of the left common femoral artery Aortogram and right lower extremity angiogram Second order cannulation of right external iliac artery Mynx closure of left common femoral artery 18 minutes moderate sedation with fentanyl and Versed   Indications:   Patient is a 83 year old female with a history of PAD.  She was seen in December for abnormal ABIs and was noted to be asymptomatic no claudication or rest pain or ulceration.  She was sent back to our office for follow-up from her nursing home as they began to notice a slow healing right heel ulcer.  She continues to deny claudication or rest pain.  Risks and benefits of angiogram with possible intervention were reviewed, she expressed understanding and elected to proceed.  Findings:  Widely patent aorta and bilateral renal arteries.  Widely patent iliac systems bilaterally  Right common femoral and profunda are widely patent.  SFA with a flush occlusion with reconstitution of an [place] of distal SFA which then occludes again at the popliteal and reconstitutes at P2.  The below-knee popliteal artery is diminutive and there is one-vessel runoff via the AT.   Procedure:  The patient was identified in the holding area and taken to the cath lab  The patient was then placed supine on the table and prepped and draped in the usual sterile fashion.  A time out was called.  Ultrasound was used to evaluate the left common femoral artery.  It was patent .  A digital ultrasound image was acquired.  A micropuncture needle was used to access the left common femoral artery under ultrasound guidance.  An 018 wire was advanced without resistance and a micropuncture sheath was placed.  The 018 wire was removed and a  benson wire was placed.  The micropuncture sheath was exchanged for a 5 french sheath.  An omniflush catheter was advanced over the wire to the level of L-1.  An abdominal angiogram was obtained.  Next, using the omniflush catheter and a glide advantage wire, the aortic bifurcation was crossed and the catheter was placed into theright external iliac artery and right runoff was obtained. This demonstrated the above findings.  The Omni catheter was then removed and a minx closure device was deployed with excellent hemostasis.  Contrast: 45 cc Sedation: 18 minutes  Impression: Long length of SFA occlusion with one-vessel runoff via the AT and significant collateralization.  No endovascular options for revascularization.   Daria Pastures MD [facility] [place] Office: [phone]

## 2024-01-16 NOTE — Progress Notes (Signed)
 Left groin dressing saturated. Removed, no further oozing, no hematoma; site redressed w/gauze and tegaderm

## 2024-01-16 NOTE — Interval H&P Note (Signed)
 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  The various methods of treatment have been discussed with the patient and family. After consideration of risks, benefits and other options for treatment, the patient has consented to  Procedure(s): ABDOMINAL AORTOGRAM (N/A) Upper Extremity Angiography (N/A) Lower Extremity Angiography (N/A) as a surgical intervention.  The patient's history has been reviewed, patient examined, no change in status, stable for surgery.  I have reviewed the patient's chart and labs.  Questions were answered to the patient's satisfaction.     Brenda Gibson

## 2024-01-16 NOTE — Plan of Care (Signed)
  Problem: Education: Goal: Knowledge of General Education information will improve Description: Including pain rating scale, medication(s)/side effects and non-pharmacologic comfort measures Outcome: Not Progressing   Problem: Clinical Measurements: Goal: Respiratory complications will improve Outcome: Not Progressing   Problem: Activity: Goal: Risk for activity intolerance will decrease Outcome: Not Progressing

## 2024-01-16 NOTE — Progress Notes (Signed)
 Patient came from [facility]. Unable to consent for herself. Finally got a hold of legal guardian, Leighton Parody, [phone], who consented over the phone to 2 RN's.  Facility called and stated they were unable to monitor the patient safely post procedure and requested patient stay over night as she is constantly up and down and isn't one to ask for help. RN at facility is Ulla Potash and the nurses stations number is [phone] for updates. She will need transport back to facility via [employer], [phone].

## 2024-01-17 ENCOUNTER — Encounter (HOSPITAL_COMMUNITY): Payer: Self-pay | Admitting: Vascular Surgery

## 2024-01-17 DIAGNOSIS — I739 Peripheral vascular disease, unspecified: Secondary | ICD-10-CM | POA: Diagnosis not present

## 2024-01-17 LAB — MRSA NEXT GEN BY PCR, NASAL: MRSA by PCR Next Gen: NOT DETECTED

## 2024-01-17 NOTE — TOC CM/SW Note (Signed)
 Transition of Care [facility]) - Inpatient Brief Assessment   Patient Details  Name: Brenda Gibson MRN: [id number] Date of Birth: 08/05/1941  Transition of Care [facility]) CM/SW Contact:    Michaela Corner, LCSWA Phone Number: 01/17/2024, 11:54 AM   Clinical Narrative: Patient is from [facility]. Will return to [place].    Transition of Care Asessment: Insurance and Status: Insurance coverage has been reviewed Patient has primary care physician: Yes Home environment has been reviewed: From [facility] Prior level of function:: Unable to assess Prior/Current Home Services: No current home services Social Drivers of Health Review: SDOH reviewed no interventions necessary Readmission risk has been reviewed: Yes Transition of care needs: transition of care needs identified, TOC will continue to follow

## 2024-01-17 NOTE — Progress Notes (Signed)
 AVS and social worker's paperwork placed in discharge packet.   Attempted to call report to [place]; unsuccessful.  Awaiting [facility] to transport pt with all belongings.

## 2024-01-17 NOTE — Progress Notes (Signed)
 1330: Juree, LPN from [place] called for report. All questions answered to satisfaction.

## 2024-01-17 NOTE — Discharge Summary (Addendum)
 Discharge Summary  Patient ID: Brenda Gibson [id number] 83 y.o. 1941/08/08  Admit date: 01/16/2024  Discharge date and time: 01/17/24   Admitting Physician: Daria Pastures, MD   Discharge Physician: same  Admission Diagnoses: PAD (peripheral artery disease) [facility]) [I73.9]  Discharge Diagnoses: PAD  Admission Condition: fair  Discharged Condition: fair  Indication for Admission: observation  Hospital Course: Brenda Gibson is an 83 year old who was brought in as an outpatient and underwent aortogram with right lower extremity runoff due to right heel ulceration.  She does not have any options for endovascular revascularization and is considered optimized from a vascular surgery standpoint.  She will require offloading of the right heel with meticulous wound care to avoid major leg amputation.  She was kept overnight in the hospital for observation of the left groin access site.  Postoperative day #1 left groin is without hematoma.  She will be discharged back to her assisted living facility today.  She can follow-up as needed in the office.  Consults: None  Treatments: surgery: Aortogram with right lower extremity runoff by Dr. Hetty Blend on 01/16/2024  Discharge Exam:  L groin without hematoma Vitals:   01/17/24 0829 01/17/24 0840  BP: (!) 157/64   Pulse: 70 71  Resp:  13  Temp: 98.6 F (37 C)   SpO2: 91% 93%     Disposition: Discharge disposition: 03-Skilled Nursing Facility       Patient Instructions:  Allergies as of 01/17/2024   No Known Allergies      Medication List     TAKE these medications    acetaminophen 500 MG tablet Commonly known as: TYLENOL Take 1,000 mg by mouth every 8 (eight) hours as needed for moderate pain.   amLODipine 2.5 MG tablet Commonly known as: NORVASC Take 1 tablet (2.5 mg total) by mouth daily.   ascorbic acid 500 MG tablet Commonly known as: VITAMIN C Take 500 mg by mouth daily.   aspirin EC 81 MG tablet Take 81 mg by  mouth in the morning and at bedtime. Swallow whole.   atorvastatin 80 MG tablet Commonly known as: LIPITOR Take 80 mg by mouth at bedtime.   benzonatate 200 MG capsule Commonly known as: TESSALON Take 1 capsule (200 mg total) by mouth 2 (two) times daily as needed for cough.   CALTRATE 600 PLUS-VIT D PO Take 1 tablet by mouth in the morning and at bedtime. 600 mg-20 mcg (800 unit)   clopidogrel 75 MG tablet Commonly known as: PLAVIX Take 75 mg by mouth daily.   feeding supplement Liqd Take 237 mLs by mouth 2 (two) times daily between meals.   ipratropium-albuterol 0.5-2.5 (3) MG/3ML Soln Commonly known as: DUONEB Take 3 mLs by nebulization every 6 (six) hours as needed.   lactulose (encephalopathy) 10 GM/15ML Soln Commonly known as: CHRONULAC Take 20 mLs by mouth 2 (two) times daily.   mometasone-formoterol 100-5 MCG/ACT Aero Commonly known as: DULERA Inhale 2 puffs into the lungs 2 (two) times daily.   multivitamin with minerals tablet Take 1 tablet by mouth at bedtime.   OXYGEN Place 1-5 L into the nose See admin instructions. Continuous/ Needed May titrate to keep SATS >92 % If greater than 5L needed to maintain SATS, Notify provider   pantoprazole 20 MG tablet Commonly known as: PROTONIX Take 20 mg by mouth daily.   polyethylene glycol powder 17 GM/SCOOP powder Commonly known as: MiraLax Take 17 g by mouth daily.   Selenium Sulfide 2.25 % Sham Apply 20  mLs topically See admin instructions. Apply 20 ml to scalp on Monday, Wednesday, and Friday. Leave on scalp for 5 minutes before rinsing   senna 8.6 MG Tabs tablet Commonly known as: SENOKOT Take 8.6 mg by mouth 2 (two) times daily as needed (bowel aid).   senna 8.6 MG Tabs tablet Commonly known as: SENOKOT Take 1 tablet by mouth 2 (two) times daily as needed (Bowel aid).   sennosides-docusate sodium 8.6-50 MG tablet Commonly known as: SENOKOT-S Take 2 tablets by mouth at bedtime.       Activity:  activity as tolerated Diet: regular diet Wound Care:  Offload right heel  Follow-up with [facility] as needed  Signed: Emilie Rutter, PA-C 01/17/2024 8:50 AM [facility] Office: [phone]  VASCULAR STAFF ADDENDUM: I have independently interviewed and examined the patient. I agree with the above.  Continue with local wound care and heal offloading  Daria Pastures MD [facility] Phone Number: [phone] 01/17/2024 9:26 AM

## 2024-01-17 NOTE — TOC Transition Note (Signed)
 Transition of Care [facility]) - Discharge Note   Patient Details  Name: Brenda Gibson MRN: [id number] Date of Birth: 05-17-1941  Transition of Care [facility]) CM/SW Contact:  Michaela Corner, LCSWA Phone Number: 01/17/2024, 11:51 AM   Clinical Narrative:   Patient will DC to: [facility] Anticipated DC date: 01/17/24 Family notified: Leighton Parody (legal guardian) Transport by: Sharin Mons   Per MD patient ready for DC to [facility]. RN to call report prior to discharge [phone]; room 805B ). RN, patient, patient's family, and facility notified of DC. Discharge Summary and FL2 sent to facility. DC packet on chart. Ambulance transport requested for patient.   CSW will sign off for now as social work intervention is no longer needed. Please consult [place] again if new needs arise.  Final next level of care: Skilled Nursing Facility Barriers to Discharge: Barriers Resolved   Patient Goals and CMS Choice            Discharge Placement              Patient chooses bed at: [facility] Patient to be transferred to facility by: [facility] Name of family member notified: Leighton Parody (legal guardian) Patient and family notified of of transfer: 01/17/24  Discharge Plan and Services Additional resources added to the After Visit Summary for                                       Social Drivers of Health (SDOH) Interventions SDOH Screenings   Food Insecurity: No Food Insecurity (01/16/2024)  Housing: Low Risk  (01/16/2024)  Transportation Needs: No Transportation Needs (01/16/2024)  Utilities: Not At Risk (01/16/2024)  Social Connections: Unknown (01/16/2024)  Tobacco Use: High Risk (01/16/2024)     Readmission Risk Interventions     No data to display

## 2024-01-17 NOTE — Plan of Care (Signed)
  Problem: Pain Managment: Goal: General experience of comfort will improve and/or be controlled Outcome: Progressing   Problem: Safety: Goal: Ability to remain free from injury will improve Outcome: Progressing   Problem: Skin Integrity: Goal: Risk for impaired skin integrity will decrease Outcome: Progressing

## 2024-01-17 NOTE — Progress Notes (Signed)
 Pt bleeding from puncture site to right abdomen. Gauze and tape applied. Bleeding stopped. Emilie Rutter, PA paged. No new orders at this time.

## 2024-01-17 NOTE — Discharge Instructions (Signed)
 Offload R heel ulcer as much as possible      [facility]  Discharge Instructions  Lower Extremity Angiogram; Angioplasty/Stenting  Please refer to the following instructions for your post-procedure care. Your surgeon or physician assistant will discuss any changes with you.  Activity  Avoid lifting more than 8 pounds (1 gallons of milk) for 72 hours (3 days) after your procedure. You may walk as much as you can tolerate. It's OK to drive after 72 hours.  Bathing/Showering  You may shower the day after your procedure. If you have a bandage, you may remove it at 24- 48 hours. Clean your incision site with mild soap and water. Pat the area dry with a clean towel.  Diet  Resume your pre-procedure diet. There are no special food restrictions following this procedure. All patients with peripheral vascular disease should follow a low fat/low cholesterol diet. In order to heal from your surgery, it is CRITICAL to get adequate nutrition. Your body requires vitamins, minerals, and protein. Vegetables are the best source of vitamins and minerals. Vegetables also provide the perfect balance of protein. Processed food has little nutritional value, so try to avoid this.  Medications  Resume taking all of your medications unless your doctor tells you not to. If your incision is causing pain, you may take over-the-counter pain relievers such as acetaminophen (Tylenol)  Follow Up  Follow up will be arranged at the time of your procedure. You may have an office visit scheduled or may be scheduled for surgery. Ask your surgeon if you have any questions.  Please call us immediately for any of the following conditions: Severe or worsening pain your legs or feet at rest or with walking. Increased pain, redness, drainage at your groin puncture site. Fever of 101 degrees or higher. If you have any mild or slow bleeding from your puncture site: lie down, apply firm  constant pressure over the area with a piece of gauze or a clean wash cloth for 30 minutes- no peeking!, call 911 right away if you are still bleeding after 30 minutes, or if the bleeding is heavy and unmanageable.  Reduce your risk factors of vascular disease:  Stop smoking. If you would like help call [facility] at 1-800-QUIT-NOW (319-726-0100) or [place] at 413-711-0742. Manage your cholesterol Maintain a desired weight Control your diabetes Keep your blood pressure down  If you have any questions, please call the office at [phone]

## 2024-03-05 ENCOUNTER — Other Ambulatory Visit: Payer: Self-pay

## 2024-03-05 DIAGNOSIS — I739 Peripheral vascular disease, unspecified: Secondary | ICD-10-CM

## 2024-03-09 ENCOUNTER — Ambulatory Visit (HOSPITAL_COMMUNITY)
Admission: RE | Admit: 2024-03-09 | Discharge: 2024-03-09 | Disposition: A | Discharge status: Home / Self Care | Source: Ambulatory Visit | Attending: Vascular Surgery | Admitting: Vascular Surgery

## 2024-03-09 ENCOUNTER — Ambulatory Visit (INDEPENDENT_AMBULATORY_CARE_PROVIDER_SITE_OTHER): Admitting: Physician Assistant

## 2024-03-09 VITALS — BP 170/70 | HR 61 | Temp 97.8°F | Ht 65.0 in | Wt 129.0 lb

## 2024-03-09 DIAGNOSIS — I70234 Atherosclerosis of native arteries of right leg with ulceration of heel and midfoot: Secondary | ICD-10-CM

## 2024-03-09 DIAGNOSIS — I739 Peripheral vascular disease, unspecified: Secondary | ICD-10-CM | POA: Insufficient documentation

## 2024-03-09 LAB — VAS US ABI WITH/WO TBI
Left ABI: 0.62
Right ABI: 0.46

## 2024-03-09 NOTE — Progress Notes (Signed)
 Office Note   History of Present Illness   Brenda Gibson is a 83 y.o. (1941-02-26) female who presents for wound check.  She recently underwent diagnostic right lower extremity angiogram.  This demonstrated patent right iliac, common femoral, and profundofemoral arteries.  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  Her angiogram was performed for critical limb ischemia with a right heel ulceration.  She had no endovascular options for revascularization.  She returns today for follow-up.  She has dementia and is not a very good historian.  A worker from her SNF accompanies the patient today.  She states that the facility has been taking care of the patient's wound.  The patient denies any pain in her foot.  Current Outpatient Medications  Medication Sig Dispense Refill   acetaminophen  (TYLENOL ) 500 MG tablet Take 325 mg by mouth every 8 (eight) hours as needed for moderate pain (pain score 4-6).     amLODipine  (NORVASC ) 2.5 MG tablet Take 1 tablet (2.5 mg total) by mouth daily.     ascorbic acid  (VITAMIN C) 500 MG tablet Take 500 mg by mouth daily.     aspirin  EC 81 MG tablet Take 81 mg by mouth in the morning and at bedtime. Swallow whole.     atorvastatin  (LIPITOR) 80 MG tablet Take 80 mg by mouth at bedtime.     benzonatate  (TESSALON ) 200 MG capsule Take 1 capsule (200 mg total) by mouth 2 (two) times daily as needed for cough. 20 capsule 0   Calcium -Vitamin D (CALTRATE 600 PLUS-VIT D PO) Take 1 tablet by mouth in the morning and at bedtime. 600 mg-20 mcg (800 unit)     clopidogrel  (PLAVIX ) 75 MG tablet Take 75 mg by mouth daily.     feeding supplement (ENSURE ENLIVE / ENSURE PLUS) LIQD Take 237 mLs by mouth 2 (two) times daily between meals.     ipratropium-albuterol  (DUONEB) 0.5-2.5 (3) MG/3ML SOLN Take 3 mLs by nebulization every 6 (six) hours as needed.     lactulose , encephalopathy, (CHRONULAC ) 10 GM/15ML SOLN  Take 20 mLs by mouth 2 (two) times daily.     mometasone -formoterol  (DULERA ) 100-5 MCG/ACT AERO Inhale 2 puffs into the lungs 2 (two) times daily.     Multiple Vitamins-Minerals (MULTIVITAMIN WITH MINERALS) tablet Take 1 tablet by mouth at bedtime.     OXYGEN Place 1-5 L into the nose See admin instructions. Continuous/ Needed May titrate to keep SATS >92 % If greater than 5L needed to maintain SATS, Notify provider     pantoprazole  (PROTONIX ) 20 MG tablet Take 20 mg by mouth daily.     polyethylene glycol powder (MIRALAX ) 17 GM/SCOOP powder Take 17 g by mouth daily. 238 g 0   Selenium Sulfide 2.25 % SHAM Apply 20 mLs topically See admin instructions. Apply 20 ml to scalp on Monday, Wednesday, and Friday. Leave on scalp for 5 minutes before rinsing     senna (SENOKOT) 8.6 MG TABS tablet Take 8.6 mg by mouth 2 (two) times daily as needed (bowel aid).     senna (SENOKOT) 8.6 MG TABS tablet Take 1 tablet by mouth 2 (two) times daily as needed (Bowel aid).     sennosides-docusate sodium  (SENOKOT-S) 8.6-50 MG tablet Take 2 tablets by mouth at bedtime.     No current facility-administered medications for this visit.    REVIEW OF SYSTEMS (negative unless checked):   Cardiac:  []   Chest pain or chest pressure? []  Shortness of breath upon activity? []  Shortness of breath when lying flat? []  Irregular heart rhythm?  Vascular:  []  Pain in calf, thigh, or hip brought on by walking? []  Pain in feet at night that wakes you up from your sleep? []  Blood clot in your veins? []  Leg swelling?  Pulmonary:  []  Oxygen at home? []  Productive cough? []  Wheezing?  Neurologic:  []  Sudden weakness in arms or legs? []  Sudden numbness in arms or legs? []  Sudden onset of difficult speaking or slurred speech? []  Temporary loss of vision in one eye? []  Problems with dizziness?  Gastrointestinal:  []  Blood in stool? []  Vomited blood?  Genitourinary:  []  Burning when urinating? []  Blood in  urine?  Psychiatric:  []  Major depression  Hematologic:  []  Bleeding problems? []  Problems with blood clotting?  Dermatologic:  []  Rashes or ulcers?  Constitutional:  []  Fever or chills?  Ear/Nose/Throat:  []  Change in hearing? []  Nose bleeds? []  Sore throat?  Musculoskeletal:  []  Back pain? []  Joint pain? []  Muscle pain?   Physical Examination    Vitals:   03/09/24 1352  BP: (!) 170/70  Pulse: 61  Temp: 97.8 F (36.6 C)  TempSrc: Temporal  SpO2: 90%  Weight: 129 lb (58.5 kg)  Height: 5\' 5"  (1.651 m)   Body mass index is 21.47 kg/m.  General:  WDWN in NAD; vital signs documented above Gait: Not observed HENT: WNL, normocephalic Pulmonary: normal non-labored breathing , without rales, rhonchi,  wheezing Cardiac: regular Abdomen: soft, NT, no masses Skin: without rashes Vascular Exam/Pulses: monophasic right DP doppler signal Extremities: healing right heel ulceration, no signs of infection Musculoskeletal: no muscle wasting or atrophy  Neurologic: alert to self   Non-Invasive Vascular imaging   ABI (03/09/2024) R:  ABI: 0.46   DP: mono TBI:  0.23 L:  ABI: 0.62   PT: mono DP: mono TBI: 0.5   Medical Decision Making   Brenda Gibson is a 83 y.o. female who presents for wound check  Based on the patient's vascular studies, she has severe right lower extremity arterial disease and moderate left lower extremity arterial disease.  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  She has single-vessel runoff into the foot via AT.  She had no endovascular options for revascularization She resides at a nursing facility.  The wound care nurse has been taking care of her right heel ulceration. On exam today she has a monophasic right DP Doppler signal.  Her right heel ulceration looks much healthier today and smaller I have recommended that  the facility continue local wound care to the area.  They should also ensure that the patient continues to offload her heel when laying in bed She can return to clinic in 3 months for repeat wound check and ABIs   Deneise Finlay PA-C [facility] [place] Office: [phone]  Clinic MD: Susi Eric

## 2024-03-14 ENCOUNTER — Other Ambulatory Visit: Payer: Self-pay

## 2024-03-14 DIAGNOSIS — I739 Peripheral vascular disease, unspecified: Secondary | ICD-10-CM

## 2024-06-08 ENCOUNTER — Encounter (HOSPITAL_COMMUNITY)

## 2024-06-08 ENCOUNTER — Ambulatory Visit
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
## 2024-07-06 ENCOUNTER — Ambulatory Visit (HOSPITAL_COMMUNITY)
Admission: RE | Admit: 2024-07-06 | Discharge: 2024-07-06 | Disposition: A | Discharge status: Home / Self Care | Source: Ambulatory Visit | Attending: Vascular Surgery | Admitting: Vascular Surgery

## 2024-07-06 ENCOUNTER — Ambulatory Visit: Attending: Vascular Surgery | Admitting: Physician Assistant

## 2024-07-06 ENCOUNTER — Encounter: Payer: Self-pay | Admitting: Physician Assistant

## 2024-07-06 VITALS — BP 153/71 | HR 56 | Temp 97.7°F | Wt 125.9 lb

## 2024-07-06 DIAGNOSIS — I739 Peripheral vascular disease, unspecified: Secondary | ICD-10-CM

## 2024-07-06 DIAGNOSIS — I70234 Atherosclerosis of native arteries of right leg with ulceration of heel and midfoot: Secondary | ICD-10-CM

## 2024-07-06 LAB — VAS US ABI WITH/WO TBI
Left ABI: 0.45
Right ABI: 0.33

## 2024-07-09 ENCOUNTER — Other Ambulatory Visit: Payer: Self-pay

## 2024-07-09 DIAGNOSIS — I739 Peripheral vascular disease, unspecified: Secondary | ICD-10-CM

## 2025-01-04 ENCOUNTER — Encounter (HOSPITAL_COMMUNITY)

## 2025-01-04 ENCOUNTER — Ambulatory Visit
